# Patient Record
Sex: Male | Born: 1955
Health system: Southern US, Community
[De-identification: ages and names within clinical notes are randomized; demographics above are authoritative.]

## PROBLEM LIST (undated history)

## (undated) DIAGNOSIS — F32A Depression, unspecified: Secondary | ICD-10-CM

## (undated) DIAGNOSIS — J449 Chronic obstructive pulmonary disease, unspecified: Secondary | ICD-10-CM

## (undated) DIAGNOSIS — C801 Malignant (primary) neoplasm, unspecified: Secondary | ICD-10-CM

## (undated) DIAGNOSIS — Z87442 Personal history of urinary calculi: Secondary | ICD-10-CM

## (undated) DIAGNOSIS — I639 Cerebral infarction, unspecified: Secondary | ICD-10-CM

## (undated) DIAGNOSIS — T8859XA Other complications of anesthesia, initial encounter: Secondary | ICD-10-CM

## (undated) DIAGNOSIS — I10 Essential (primary) hypertension: Secondary | ICD-10-CM

## (undated) DIAGNOSIS — J189 Pneumonia, unspecified organism: Secondary | ICD-10-CM

## (undated) DIAGNOSIS — J45909 Unspecified asthma, uncomplicated: Secondary | ICD-10-CM

## (undated) DIAGNOSIS — I4891 Unspecified atrial fibrillation: Secondary | ICD-10-CM

## (undated) DIAGNOSIS — R0600 Dyspnea, unspecified: Secondary | ICD-10-CM

## (undated) DIAGNOSIS — M1990 Unspecified osteoarthritis, unspecified site: Secondary | ICD-10-CM

## (undated) HISTORY — DX: Depression, unspecified: F32.A

## (undated) HISTORY — DX: Unspecified osteoarthritis, unspecified site: M19.90

---

## 1998-09-18 ENCOUNTER — Ambulatory Visit (HOSPITAL_COMMUNITY): Admission: RE | Admit: 1998-09-18 | Discharge: 1998-09-18 | Payer: Self-pay | Admitting: Internal Medicine

## 1998-09-18 ENCOUNTER — Encounter: Payer: Self-pay | Admitting: Internal Medicine

## 2005-09-30 ENCOUNTER — Ambulatory Visit (HOSPITAL_COMMUNITY): Admission: RE | Admit: 2005-09-30 | Discharge: 2005-09-30 | Payer: Self-pay | Admitting: Internal Medicine

## 2005-09-30 ENCOUNTER — Encounter: Payer: Self-pay | Admitting: Internal Medicine

## 2005-09-30 ENCOUNTER — Ambulatory Visit: Payer: Self-pay | Admitting: Internal Medicine

## 2009-01-16 ENCOUNTER — Ambulatory Visit (HOSPITAL_COMMUNITY): Admission: RE | Admit: 2009-01-16 | Discharge: 2009-01-16 | Payer: Self-pay | Admitting: General Surgery

## 2009-12-14 ENCOUNTER — Ambulatory Visit (HOSPITAL_COMMUNITY): Admission: RE | Admit: 2009-12-14 | Discharge: 2009-12-14 | Payer: Self-pay | Admitting: Internal Medicine

## 2009-12-21 ENCOUNTER — Ambulatory Visit (HOSPITAL_COMMUNITY): Admission: RE | Admit: 2009-12-21 | Discharge: 2009-12-21 | Payer: Self-pay | Admitting: Orthopaedic Surgery

## 2010-11-23 LAB — DIFFERENTIAL
Basophils Absolute: 0 10*3/uL (ref 0.0–0.1)
Lymphocytes Relative: 27 % (ref 12–46)
Monocytes Absolute: 0.8 10*3/uL (ref 0.1–1.0)
Neutro Abs: 6.7 10*3/uL (ref 1.7–7.7)
Neutrophils Relative %: 64 % (ref 43–77)

## 2010-11-23 LAB — BASIC METABOLIC PANEL
BUN: 10 mg/dL (ref 6–23)
CO2: 29 mEq/L (ref 19–32)
Calcium: 9.7 mg/dL (ref 8.4–10.5)
Chloride: 101 mEq/L (ref 96–112)
Creatinine, Ser: 0.8 mg/dL (ref 0.4–1.5)
GFR calc Af Amer: >60 mL/min (ref >60)
GFR calc non Af Amer: >60 mL/min (ref >60)
Glucose, Bld: 89 mg/dL (ref 70–99)
Potassium: 3.9 mEq/L (ref 3.5–5.1)
Sodium: 138 mEq/L (ref 135–145)

## 2010-11-23 LAB — CBC
HCT: 48.3 % (ref 39.0–52.0)
Hemoglobin: 17 g/dL (ref 13.0–17.0)
MCHC: 35.1 g/dL (ref 30.0–36.0)
MCV: 92.1 fL (ref 78.0–100.0)
Platelets: 229 10*3/uL (ref 150–400)
RBC: 5.24 MIL/uL (ref 4.22–5.81)
RDW: 12.8 % (ref 11.5–15.5)
WBC: 10.5 10*3/uL (ref 4.0–10.5)

## 2010-12-28 NOTE — Op Note (Signed)
NAME:  MATHEWS, STUHR NO.:  [id number]   MEDICAL RECORD NO.:  [id number]          PATIENT TYPE:  AMB   LOCATION:  DAY                           FACILITY:  [facility]   PHYSICIAN:  Dalia Heading, M.D.  DATE OF BIRTH:  06-Jul-1956   DATE OF PROCEDURE:  01/16/2009  DATE OF DISCHARGE:  01/16/2009                               OPERATIVE REPORT   PREOPERATIVE DIAGNOSIS:  Right inguinal hernia.   POSTOPERATIVE DIAGNOSIS:  Right inguinal hernia, indirect.   PROCEDURE:  Right inguinal herniorrhaphy.   SURGEON:  Dalia Heading, MD   ANESTHESIA:  Spinal.   INDICATIONS:  The patient is a 55 year old white male who is referred  for evaluation and treatment of a right inguinal hernia.  He has been  symptomatic for some time now, but it has been recently increasing in  size and is causing him discomfort.  The patient now comes to the  operating room for right inguinal herniorrhaphy.  The risks and benefits  of the procedure including bleeding, infection, pain, and the possible  recurrence of the hernia were fully explained to the patient, gave  informed consent.   PROCEDURE NOTE:  The patient was placed in the supine position.  After  spinal anesthesia was administered, the right groin region was prepped  and draped using the usual sterile technique with Betadine.  Surgical  site confirmation was performed.   A transverse incision was made in the right groin region down to the  external oblique aponeurosis.  The aponeurosis was incised to the  external ring.  A Penrose drain was placed around the spermatic cord.  The vas deferens was noted within the spermatic cord.  Care was taken to  avoid the ilioinguinal nerve.  An indirect hernia sac was found.  This  was freed away from the spermatic cord up to the peritoneal reflection  and inverted.  A large-size PerFix mesh plug was then placed into this  region, secured to the transversalis fascia using 2-0 Novofil  interrupted  suture.  A onlay patch was then placed along the floor of  the inguinal canal and secured superiorly to the conjoined tendon and  inferiorly to the shelving edge of Poupart ligament using 2-0 Novofil  interrupted sutures.  The internal ring was recreated using a 2-0  Novofil interrupted suture.  The external oblique aponeurosis was  reapproximated using a 2-0 Vicryl running suture.  The subcutaneous  layer was reapproximated using a 3-0 Vicryl interrupted suture.  The  skin was closed using a 4-0 Vicryl subcuticular suture.  Sensorcaine  0.5% was instilled in the surrounding wound.  Dermabond was then  applied.   All tape and needle counts were correct at the end of the procedure.  The patient was transferred to PACU in stable condition.  Complications  none.   SPECIMEN:  None.   BLOOD LOSS:  Minimal.      Dalia Heading, M.D.  Electronically Signed     MAJ/MEDQ  D:  01/16/2009  T:  01/17/2009  Job:  [id number]   cc:   Channing Mutters  Marvene Staff, MD  Fax: [phone]

## 2010-12-28 NOTE — H&P (Signed)
NAME:  Jose Joseph, Jose Joseph NO.:  [id number]   MEDICAL RECORD NO.:  [id number]          PATIENT TYPE:  AMB   LOCATION:  DAY                           FACILITY:  [facility]   PHYSICIAN:  Dalia Heading, M.D.  DATE OF BIRTH:  [birth year]   DATE OF ADMISSION:  DATE OF DISCHARGE:  LH                              HISTORY & PHYSICAL   CHIEF COMPLAINT:  Right inguinal hernia.   HISTORY OF PRESENT ILLNESS:  The patient is a 55 year old white male who  is referred for evaluation and treatment of right inguinal hernia.  It  has been present for some time now, but recently has increased in size  and is causing discomfort.  It has made worse with straining.   PAST MEDICAL HISTORY:  Unremarkable.   PAST SURGICAL HISTORY:  Right knee surgery, right wrist surgery, neck  surgery.   CURRENT MEDICATIONS:  Wellbutrin.   ALLERGIES:  No known drug allergies.   REVIEW OF SYSTEMS:  Noncontributory.   PHYSICAL EXAMINATION:  GENERAL:  The patient is a well-developed, well-  nourished white male in no acute distress.  LUNGS:  Clear to auscultation with equal breath sounds bilaterally.  HEART:  Regular rate and rhythm without S3, S4, and murmurs.  ABDOMEN:  Soft, nontender, nondistended.  No hepatosplenomegaly or  masses are noted.  Reducible right inguinal hernia is present.  GENITOURINARY:  Within normal limits.   IMPRESSION:  Right inguinal hernia.   PLAN:  The patient is scheduled for right inguinal herniorrhaphy on January 16, 2009.  The risks and benefits of the procedure including bleeding,  infection, pain, recurrence of the hernia were fully explained to the  patient, gave informed consent.      Dalia Heading, M.D.  Electronically Signed     MAJ/MEDQ  D:  01/07/2009  T:  01/08/2009  Job:  [id number]   cc:   Kingsley Callander. Ouida Sills, MD  Fax: [phone]

## 2010-12-31 NOTE — Op Note (Signed)
NAME:  Jose Joseph, Jose Joseph NO.:  [id number]   MEDICAL RECORD NO.:  [id number]          PATIENT TYPE:  AMB   LOCATION:  DAY                           FACILITY:  [facility]   PHYSICIAN:  R. Roetta Sessions, M.D. DATE OF BIRTH:  1956-04-23   DATE OF PROCEDURE:  09/30/2005  DATE OF DISCHARGE:                                 OPERATIVE REPORT   PROCEDURE:  Colonoscopy with biopsy.   INDICATIONS FOR PROCEDURE:  The patient is a 55 year old gentleman sent over  at the courtesy of Dr. Carylon Perches for colorectal cancer screening. He is  devoid of any lower GI tract symptoms. There is no history of colorectal  neoplasia in his family. He has never had his lower GI tract imaged.  Colonoscopy is now being done. This approach has been discussed with the  patient at length. Potential risks, benefits, and alternatives have been  reviewed and questions answered. He is agreeable. Please see documentation  in the medical record.   PROCEDURE NOTE:  O2 saturation, blood pressure, pulse, and respirations were  monitored throughout the entire procedure. Conscious sedation with Versed 4  mg IV and Demerol 100 mg IV in divided doses.   INSTRUMENT:  Olympus video chip system.   FINDINGS:  Digital rectal exam revealed no abnormalities.   ENDOSCOPIC FINDINGS:  Prep was good.   Rectum:  Examination of the rectal mucosa including retroflexed view of the  anal verge revealed no abnormalities.   Colon:  Colonic mucosa was surveyed from the rectosigmoid junction through  the left, transverse, and right colon to the area of the appendiceal  orifice, ileocecal valve, and cecum. These structures were well seen and  photographed for the record. From this level, the scope was slowly  withdrawn, and all previously mentioned mucosal surfaces were again seen.  The patient was noted to have scattered sigmoid diverticula. There were  three 3-mm diminutive polyps at 25 cm around the anal verge. There were cold  biopsied/removed. The remainder of the colonic mucosa appeared normal. The  patient tolerated the procedure well and was reactive to endoscopy. Cecal  withdraw time 10 minutes.   IMPRESSION:  1.  Normal rectum.  2.  Left sided diverticula. Three diminutive polyps at 25 cm, removed with      cold biopsy forceps technique. The remainder of the colonic mucosa      appeared normal.   RECOMMENDATIONS:  1.  Diverticulosis literature provided to [name].  2.  Follow up on pathology.  3.  Further recommendations to follow.      Jonathon Bellows, M.D.  Electronically Signed     RMR/MEDQ  D:  09/30/2005  T:  09/30/2005  Job:  [id number]   cc:   Kingsley Callander. Ouida Sills, MD  Fax: [phone]

## 2015-09-07 ENCOUNTER — Telehealth: Payer: Self-pay | Admitting: Internal Medicine

## 2015-09-07 NOTE — Telephone Encounter (Signed)
RECALL FOR TCS °

## 2015-09-09 NOTE — Telephone Encounter (Signed)
Letter mailed to pt.  

## 2016-08-26 DIAGNOSIS — Z0001 Encounter for general adult medical examination with abnormal findings: Secondary | ICD-10-CM | POA: Diagnosis not present

## 2016-09-02 DIAGNOSIS — Z0001 Encounter for general adult medical examination with abnormal findings: Secondary | ICD-10-CM | POA: Diagnosis not present

## 2016-09-02 DIAGNOSIS — Z6834 Body mass index (BMI) 34.0-34.9, adult: Secondary | ICD-10-CM | POA: Diagnosis not present

## 2016-09-02 DIAGNOSIS — K219 Gastro-esophageal reflux disease without esophagitis: Secondary | ICD-10-CM | POA: Diagnosis not present

## 2016-09-02 DIAGNOSIS — Z23 Encounter for immunization: Secondary | ICD-10-CM | POA: Diagnosis not present

## 2016-09-02 DIAGNOSIS — M1990 Unspecified osteoarthritis, unspecified site: Secondary | ICD-10-CM | POA: Diagnosis not present

## 2017-01-18 DIAGNOSIS — H40033 Anatomical narrow angle, bilateral: Secondary | ICD-10-CM | POA: Diagnosis not present

## 2017-01-18 DIAGNOSIS — H2513 Age-related nuclear cataract, bilateral: Secondary | ICD-10-CM | POA: Diagnosis not present

## 2018-05-16 DIAGNOSIS — I8311 Varicose veins of right lower extremity with inflammation: Secondary | ICD-10-CM | POA: Diagnosis not present

## 2018-05-16 DIAGNOSIS — M79604 Pain in right leg: Secondary | ICD-10-CM | POA: Diagnosis not present

## 2018-05-16 DIAGNOSIS — I8312 Varicose veins of left lower extremity with inflammation: Secondary | ICD-10-CM | POA: Diagnosis not present

## 2018-05-16 DIAGNOSIS — M79605 Pain in left leg: Secondary | ICD-10-CM | POA: Diagnosis not present

## 2018-05-17 ENCOUNTER — Telehealth (HOSPITAL_COMMUNITY): Payer: Self-pay | Admitting: Surgery

## 2018-05-17 NOTE — Telephone Encounter (Signed)
Received an ABI order from [facility] (Dr. Coralie Carpen) for poorly palpable foot pulses, claudication symptoms.    I spoke with Jose Joseph, he cannot schedule an appointment at this time, he is helping his son financially.  He will explain this to Dr. Jimmie Molly and he will call us once he is ready to schedule the appointment.   [name] [name]

## 2018-11-06 DIAGNOSIS — M1612 Unilateral primary osteoarthritis, left hip: Secondary | ICD-10-CM | POA: Diagnosis not present

## 2018-11-06 DIAGNOSIS — F3340 Major depressive disorder, recurrent, in remission, unspecified: Secondary | ICD-10-CM | POA: Diagnosis not present

## 2019-02-06 DIAGNOSIS — M1990 Unspecified osteoarthritis, unspecified site: Secondary | ICD-10-CM | POA: Diagnosis not present

## 2019-02-06 DIAGNOSIS — N529 Male erectile dysfunction, unspecified: Secondary | ICD-10-CM | POA: Diagnosis not present

## 2019-02-06 DIAGNOSIS — Z79899 Other long term (current) drug therapy: Secondary | ICD-10-CM | POA: Diagnosis not present

## 2019-02-06 DIAGNOSIS — Z125 Encounter for screening for malignant neoplasm of prostate: Secondary | ICD-10-CM | POA: Diagnosis not present

## 2019-02-06 DIAGNOSIS — K219 Gastro-esophageal reflux disease without esophagitis: Secondary | ICD-10-CM | POA: Diagnosis not present

## 2019-02-06 DIAGNOSIS — E6609 Other obesity due to excess calories: Secondary | ICD-10-CM | POA: Diagnosis not present

## 2019-02-06 DIAGNOSIS — F329 Major depressive disorder, single episode, unspecified: Secondary | ICD-10-CM | POA: Diagnosis not present

## 2020-02-26 ENCOUNTER — Ambulatory Visit (INDEPENDENT_AMBULATORY_CARE_PROVIDER_SITE_OTHER): Payer: Medicare Other

## 2020-02-26 ENCOUNTER — Ambulatory Visit (INDEPENDENT_AMBULATORY_CARE_PROVIDER_SITE_OTHER): Payer: Medicare Other | Admitting: Orthopaedic Surgery

## 2020-02-26 ENCOUNTER — Other Ambulatory Visit: Payer: Self-pay

## 2020-02-26 ENCOUNTER — Ambulatory Visit (INDEPENDENT_AMBULATORY_CARE_PROVIDER_SITE_OTHER): Payer: Self-pay

## 2020-02-26 ENCOUNTER — Encounter: Payer: Self-pay | Admitting: Orthopaedic Surgery

## 2020-02-26 VITALS — Ht 74.0 in | Wt 270.0 lb

## 2020-02-26 DIAGNOSIS — G8929 Other chronic pain: Secondary | ICD-10-CM

## 2020-02-26 DIAGNOSIS — M25552 Pain in left hip: Secondary | ICD-10-CM | POA: Insufficient documentation

## 2020-02-26 DIAGNOSIS — M25511 Pain in right shoulder: Secondary | ICD-10-CM

## 2020-02-26 MED ORDER — METHYLPREDNISOLONE ACETATE 40 MG/ML IJ SUSP
80.0000 mg | INTRAMUSCULAR | Status: AC | PRN
  Administered 2020-02-26: 80 mg via INTRA_ARTICULAR

## 2020-02-26 MED ORDER — LIDOCAINE HCL 2 % IJ SOLN
2.0000 mL | INTRAMUSCULAR | Status: AC | PRN
  Administered 2020-02-26: 2 mL

## 2020-02-26 MED ORDER — BUPIVACAINE HCL 0.5 % IJ SOLN
2.0000 mL | INTRAMUSCULAR | Status: AC | PRN
  Administered 2020-02-26: 2 mL via INTRA_ARTICULAR

## 2020-02-26 NOTE — Progress Notes (Signed)
Office Visit Note   Patient: Jose Joseph           Date of Birth: Nov 22, 1955           MRN: [id number] Visit Date: 02/26/2020              Requested by: No referring provider defined for this encounter. PCP: No primary care provider on file.   Assessment & Plan: Visit Diagnoses:  1. Chronic right shoulder pain   2. Pain in left hip   3. Acute pain of right shoulder     Plan: End-stage osteoarthritis left hip.  This has progressed significantly over the past 10 years.  Long discussion regarding treatment options.  He would like to consider a hip replacement sometime next year when his wife would be available to help them in his rehabilitation.  I had a long discussion regarding the procedure, hospitalization, potential problems and need for rectal clearance.  If he like to consider doing something sooner he will call.  Right shoulder is a combination of impingement and mild osteoarthritis of the glenohumeral joint.  I injected the subacromial space and the made a "big difference".  We will monitor his response  Follow-Up Instructions: No follow-ups on file.   Orders:  Orders Placed This Encounter  Procedures  . XR Shoulder Right  . XR HIP UNILAT W OR W/O PELVIS 2-3 VIEWS LEFT   No orders of the defined types were placed in this encounter.     Procedures: Large Joint Inj: R subacromial bursa on 02/26/2020 2:30 PM Indications: pain and diagnostic evaluation Details: 25 G 1.5 in needle, anterolateral approach  Arthrogram: No  Medications: 2 mL lidocaine 2 %; 2 mL bupivacaine 0.5 %; 80 mg methylPREDNISolone acetate 40 MG/ML Consent was given by the patient. Immediately prior to procedure a time out was called to verify the correct patient, procedure, equipment, support staff and site/side marked as required. Patient was prepped and draped in the usual sterile fashion.       Clinical Data: No additional findings.   Subjective: Chief Complaint  Patient presents with    . Left Hip - Pain  . Right Shoulder - Pain  Patient presents today for right shoulder and left hip pain. He said that he fell backwards in April of this year and injured his right shoulder. His pain is located at his lateral superior shoulder. He states that it pops. He has good range of motion. No numbness, tingling, or weakness. He is right hand dominant.  He relates that he stumbled on a rock striking the right shoulder.  He is better but it still hurts.  He has been taking tramadol and ibuprofen. He also is here for his left hip. He states that he saw Dr.Dorion Petillo several years ago for his hip. His pain is located in his groin. He said that his pain in his hip is affected the way he walks now. His leg is weaker and he has to be careful when walking up stairs. He takes Tramadol and ibuprofen daily.  X-rays were obtained of his left hip about 10 years ago demonstrating early degenerative changes.  He notes that now his right left hip "affects everything.  No numbness or tingling.  He is retired and notes that the pain is predominately in the left groin.  HPI  Review of Systems   Objective: Vital Signs: Ht 6\' 2"  (1.88 m)   Wt 270 lb (122.5 kg)   BMI 34.67 kg/m  Physical Exam Constitutional:      Appearance: He is well-developed.  Eyes:     Pupils: Pupils are equal, round, and reactive to light.  Pulmonary:     Effort: Pulmonary effort is normal.  Skin:    General: Skin is warm and dry.  Neurological:     Mental Status: He is alert and oriented to person, place, and time.  Psychiatric:        Behavior: Behavior normal.     Ortho Exam awake alert and oriented x3.  Comfortable sitting.  Certainly has pain in his left hip with any rotation from a neutral position and limited to only about 10 degrees of internal/external rotation before he experiences pain.  Might be quarter of an inch shorter on the left than the right.  Neurologically intact.  Straight leg raise negative does have  some decreased internal rotation of the right hip but minimal pain.  Left shoulder with easy full overhead motion.  There was some popping and clicking in the subacromial region both anteriorly and laterally in the impingement position.  Negative empty can testing.  Minimally positive Speed sign.  Biceps intact.  Good strength Specialty Comments:  No specialty comments available.  Imaging: No results found.   PMFS History: Patient Active Problem List   Diagnosis Date Noted  . Pain in left hip 02/26/2020  . Pain in right shoulder 02/26/2020   History reviewed. No pertinent past medical history.  History reviewed. No pertinent family history.  History reviewed. No pertinent surgical history. Social History   Occupational History  . Not on file  Tobacco Use  . Smoking status: Not on file  Substance and Sexual Activity  . Alcohol use: Not on file  . Drug use: Not on file  . Sexual activity: Not on file

## 2020-04-03 ENCOUNTER — Ambulatory Visit (HOSPITAL_COMMUNITY)
Admission: RE | Admit: 2020-04-03 | Discharge: 2020-04-03 | Disposition: A | Discharge status: Home / Self Care | Payer: Medicare Other | Source: Ambulatory Visit | Attending: Internal Medicine | Admitting: Internal Medicine

## 2020-04-03 ENCOUNTER — Other Ambulatory Visit (HOSPITAL_COMMUNITY): Payer: Self-pay | Admitting: Internal Medicine

## 2020-04-03 ENCOUNTER — Other Ambulatory Visit: Payer: Self-pay

## 2020-04-03 ENCOUNTER — Other Ambulatory Visit: Payer: Self-pay | Admitting: Internal Medicine

## 2020-04-03 DIAGNOSIS — N200 Calculus of kidney: Secondary | ICD-10-CM | POA: Diagnosis not present

## 2020-04-10 ENCOUNTER — Ambulatory Visit (INDEPENDENT_AMBULATORY_CARE_PROVIDER_SITE_OTHER): Payer: Medicare Other | Admitting: Urology

## 2020-04-10 ENCOUNTER — Encounter: Payer: Self-pay | Admitting: Urology

## 2020-04-10 ENCOUNTER — Other Ambulatory Visit: Payer: Self-pay

## 2020-04-10 VITALS — BP 118/74 | HR 68 | Temp 98.6°F | Ht 74.0 in | Wt 280.0 lb

## 2020-04-10 DIAGNOSIS — R972 Elevated prostate specific antigen [PSA]: Secondary | ICD-10-CM

## 2020-04-10 LAB — URINALYSIS, ROUTINE W REFLEX MICROSCOPIC
Bilirubin, UA: NEGATIVE
Glucose, UA: NEGATIVE
Ketones, UA: NEGATIVE
Leukocytes,UA: NEGATIVE
Nitrite, UA: NEGATIVE
Protein,UA: NEGATIVE
Specific Gravity, UA: 1.02 (ref 1.005–1.030)
Urobilinogen, Ur: 0.2 mg/dL (ref 0.2–1.0)
pH, UA: 6 (ref 5.0–7.5)

## 2020-04-10 LAB — MICROSCOPIC EXAMINATION
Bacteria, UA: NONE SEEN
Epithelial Cells (non renal): NONE SEEN /hpf (ref 0–10)
Renal Epithel, UA: NONE SEEN /hpf
WBC, UA: NONE SEEN /hpf (ref 0–5)

## 2020-04-10 LAB — BLADDER SCAN AMB NON-IMAGING: Scan Result: 65.4

## 2020-04-10 NOTE — Patient Instructions (Signed)
Prostate-Specific Antigen Test Why am I having this test? The prostate-specific antigen (PSA) test is a screening test for prostate cancer. It can identify early signs of prostate cancer, which may allow for more effective treatment. Your health care provider may recommend that you have a PSA test starting at age 66 or that you have one earlier or later, depending on your risk factors for prostate cancer. You may also have a PSA test:  To monitor treatment of prostate cancer.  To check whether prostate cancer has returned after treatment.  If you have signs of other conditions that can affect PSA levels, such as: ? An enlarged prostate that is not caused by cancer (benign prostatic hyperplasia, BPH). This condition is very common in older men. ? A prostate infection. What is being tested? This test measures the amount of PSA in your blood. PSA is a protein that is made in the prostate. The prostate naturally produces more PSA as you age, but very high levels may be a sign of a medical condition. What kind of sample is taken?  A blood sample is required for this test. It is usually collected by inserting a needle into a blood vessel or by sticking a finger with a small needle. Blood for this test should be drawn before having an exam of the prostate. How do I prepare for this test? Do not ejaculate starting 24 hours before your test, or as long as told by your health care provider. Tell a health care provider about:  Any allergies you have.  All medicines you are taking, including vitamins, herbs, eye drops, creams, and over-the-counter medicines. This also includes: ? Medicines to assist with hair growth, such as finasteride. ? Any recent exposure to a medicine called diethylstilbestrol.  Any blood disorders you have.  Any recent procedures you have had, especially any procedures involving the prostate or rectum.  Any medical conditions you have.  Any recent urinary tract infections  (UTIs) you have had. How are the results reported? Your test results will be reported as a value that indicates how much PSA is in your blood. This will be given as nanograms of PSA per milliliter of blood (ng/mL). Your health care provider will compare your results to normal ranges that were established after testing a large group of people (reference ranges). Reference ranges may vary among labs and hospitals. PSA levels vary from person to person and generally increase with age. Because of this variation, there is no single PSA value that is considered normal for everyone. Instead, PSA reference ranges are used to describe whether your PSA levels are considered low or high (elevated). Common reference ranges are:  Low: 0-2.5 ng/mL.  Slightly to moderately elevated: 2.6-10.0 ng/mL.  Moderately elevated: 10.0-19.9 ng/mL.  Significantly elevated: 20 ng/mL or greater. Sometimes, the test results may report that a condition is present when it is not present (false-positive result). What do the results mean? A test result that is higher than 4 ng/mL may mean that you are at an increased risk for prostate cancer. However, a PSA test by itself is not enough to diagnose prostate cancer. High PSA levels may also be caused by the natural aging process, prostate infection, or BPH. PSA screening cannot tell you if your PSA is high due to cancer or a different cause. A prostate biopsy is the only way to diagnose prostate cancer. A risk of having the PSA test is diagnosing and treating prostate cancer that would never have caused any   symptoms or problems (overdiagnosis and overtreatment). Talk with your health care provider about what your results mean. Questions to ask your health care provider Ask your health care provider, or the department that is doing the test:  When will my results be ready?  How will I get my results?  What are my treatment options?  What other tests do I need?  What are my  next steps? Summary  The prostate-specific antigen (PSA) test is a screening test for prostate cancer.  Your health care provider may recommend that you have a PSA test starting at age 66 or that you have one earlier or later, depending on your risk factors for prostate cancer.  A test result that is higher than 4 ng/mL may mean that you are at an increased risk for prostate cancer. However, elevated levels can be caused by a number of conditions other than prostate cancer.  Talk with your health care provider about what your results mean. This information is not intended to replace advice given to you by your health care provider. Make sure you discuss any questions you have with your health care provider. Document Revised: 07/14/2017 Document Reviewed: 05/08/2017 Elsevier Patient Education  2020 [employer]  

## 2020-04-10 NOTE — Progress Notes (Signed)
04/10/2020 1:33 PM   Jose Joseph [birth year] [id number]  Referring provider: Carylon Perches, MD [address] [place],  [place] [address]  Elevated PSA  HPI: Jose Joseph is a 89MB here for evaluation of elevated PSA. PSA 4.6 in 02/2020. Per patient his PSA last year was around 2.  He has baseline nocturia 2x, weaker stream, urinary urgency with occasional urge incontinence. He is currently on antibiotics for an E coli UTI.   He had a kidney stone in July 2021 which was his first stone event   PMH: Past Medical History:  Diagnosis Date  . Arthritis   . Depression     Surgical History: Past Surgical History:  Procedure Laterality Date  . KNEE ARTHROCENTESIS    . NECK SURGERY    . WRIST ARTHROPLASTY      Home Medications:  Allergies as of 04/10/2020      Reactions   Bee Venom       Medication List       Accurate as of April 10, 2020  1:33 PM. If you have any questions, ask your nurse or doctor.        cetirizine 10 MG chewable tablet Commonly known as: ZYRTEC Chew 10 mg by mouth daily.   citalopram 20 MG tablet Commonly known as: CELEXA Take 20 mg by mouth daily.   EPINEPHrine 0.1 % nasal solution Commonly known as: ADRENALIN Place 1 drop into the nose once.   EPINEPHrine 0.15 MG/0.3ML injection Commonly known as: EPIPEN JR Inject 0.15 mg into the muscle as needed for anaphylaxis.   fexofenadine 180 MG tablet Commonly known as: ALLEGRA Take 180 mg by mouth daily.   ibuprofen 200 MG tablet Commonly known as: ADVIL Take 200 mg by mouth every 6 (six) hours as needed.   traMADol 50 MG tablet Commonly known as: ULTRAM Take by mouth every 6 (six) hours as needed.       Allergies:  Allergies  Allergen Reactions  . Bee Venom     Family History: Family History  Problem Relation Age of Onset  . Kidney cancer Sister   . Prostate cancer Brother     Social History:  reports that he quit smoking about 7 years ago. His smoking use included  cigarettes. He has a 35.00 pack-year smoking history. He has never used smokeless tobacco. He reports current alcohol use of about 2.0 standard drinks of alcohol per week. No history on file for drug use.  ROS: All other review of systems were reviewed and are negative except what is noted above in HPI  Physical Exam: BP 118/74   Pulse 68   Temp 98.6 F (37 C)   Ht 6\' 2"  (1.88 m)   Wt 280 lb (127 kg)   BMI 35.95 kg/m   Constitutional:  Alert and oriented, No acute distress. HEENT: [place] AT, moist mucus membranes.  Trachea midline, no masses. Cardiovascular: No clubbing, cyanosis, or edema. Respiratory: Normal respiratory effort, no increased work of breathing. GI: Abdomen is soft, nontender, nondistended, no abdominal masses GU: No CVA tenderness. Circumcised phallus. No masses/lesions on penis, testis, scrotum. Prostate 40g smooth no nodules no induration.  Lymph: No cervical or inguinal lymphadenopathy. Skin: No rashes, bruises or suspicious lesions. Neurologic: Grossly intact, no focal deficits, moving all 4 extremities. Psychiatric: Normal mood and affect.  Laboratory Data: Lab Results  Component Value Date   WBC 10.5 01/08/2009   HGB 17.0 01/08/2009   HCT 48.3 01/08/2009   MCV 92.1 01/08/2009  PLT 229 01/08/2009    Lab Results  Component Value Date   CREATININE 0.80 01/08/2009    No results found for: PSA  No results found for: TESTOSTERONE  No results found for: HGBA1C  Urinalysis No results found for: COLORURINE, APPEARANCEUR, LABSPEC, PHURINE, GLUCOSEU, HGBUR, BILIRUBINUR, KETONESUR, PROTEINUR, UROBILINOGEN, NITRITE, LEUKOCYTESUR  No results found for: LABMICR, WBCUA, RBCUA, LABEPIT, MUCUS, BACTERIA  Pertinent Imaging:  No results found for this or any previous visit.  No results found for this or any previous visit.  No results found for this or any previous visit.  No results found for this or any previous visit.  No results found for this or any  previous visit.  No results found for this or any previous visit.  No results found for this or any previous visit.  Results for orders placed during the hospital encounter of 04/03/20  CT RENAL STONE STUDY  Addendum 04/03/2020  3:13 PM ADDENDUM REPORT: 04/03/2020 15:10  ADDENDUM: Add to  IMPRESSION: Urinary bladder wall thickness increased. Suspect a degree of underlying cystitis.   Electronically Signed By: Bretta Bang III M.D. On: 04/03/2020 15:10  Narrative CLINICAL DATA:  Left flank pain with nausea  EXAM: CT ABDOMEN AND PELVIS WITHOUT CONTRAST  TECHNIQUE: Multidetector CT imaging of the abdomen and pelvis was performed following the standard protocol without oral or IV contrast.  COMPARISON:  None.  FINDINGS: Lower chest: Lung bases are clear.  Hepatobiliary: No focal liver lesions are evident on this noncontrast enhanced study. The gallbladder wall is not appreciably thickened. There is no biliary duct dilatation.  Pancreas: There is no pancreatic mass or inflammatory focus.  Spleen: No splenic lesions are evident.  Adrenals/Urinary Tract: Adrenals bilaterally appear unremarkable. There is a cyst arising from the lower pole of the left kidney measuring 6.2 x 4.7 cm. There is a cyst arising from the lateral lower pole of the right kidney measuring 1.3 x 1.2 cm. There is a cyst in the posterior mid right kidney measuring 1.5 x 1.4 cm. There is no appreciable hydronephrosis on either side. Note that the lower pole left renal cyst abuts and slightly impresses upon the proximal left ureter. There is no evident renal or ureteral calculus on either side. Urinary bladder wall thickness is increased.  Stomach/Bowel: There is no appreciable bowel wall or mesenteric thickening. No evident bowel obstruction. The terminal ileum appears normal. No free air or portal venous air.  Vascular/Lymphatic: There is no abdominal aortic aneurysm. There is aortic and  iliac artery atherosclerosis. Note that the aorta is somewhat tortuous. There is no adenopathy appreciable in the abdomen or pelvis.  Reproductive: Prostate and seminal vesicles are normal in size and contour. There are a few tiny prostatic calculi.  Other: The appendix appears normal. There is no evident abscess or ascites in the abdomen or pelvis. There is slight fat in the umbilicus.  Musculoskeletal: There is degenerative change in the lumbar spine. No blastic or lytic bone lesions. No intramuscular lesions evident.  IMPRESSION: 1. There is a cyst arising from the lower pole left kidney measuring 6.2 x 4.7 cm. This cyst abuts and minimally displaces the proximal left ureter. This finding conceivably could be a cause for left flank pain. No appreciable hydronephrosis on either side noted. No renal or ureteral calculus on either side.  2. No bowel obstruction. No abscess in the abdomen or pelvis. Appendix appears normal.  3. Aortic Atherosclerosis (ICD10-I70.0). There is iliac artery atherosclerotic calcification as well.  Electronically  Signed: By: Bretta Bang III M.D. On: 04/03/2020 14:38   Assessment & Plan:    1. Elevated PSA -The patient and I talked about etiologies of elevated PSA.  We discussed the possible relationship between elevated PSA and prostate cancer, BPH, prostatitis, infection trauma and recent ejaculations.  The patient's PSA has been relatively stable over the interval and as such I recommended that we follow-up with a repeat PSA in 6 months.  If it remains elevated with a positive rising trend we will discuss prostate biopsy at his follow-up appointment. - Urinalysis, Routine w reflex microscopic   No follow-ups on file.  Wilkie Aye, MD  [facility] [place]

## 2020-04-10 NOTE — Progress Notes (Signed)
Urological Symptom Review  Patient is experiencing the following symptoms: Frequent urination Get up at night to urinate Leakage of urine Urinary tract infection Kidney stones  Review of Systems  Gastrointestinal (upper)  : Negative for upper GI symptoms  Gastrointestinal (lower) : Negative for lower GI symptoms  Constitutional : Negative for symptoms  Skin: Negative for skin symptoms  Eyes: Blurred vision  Ear/Nose/Throat : Sinus problems  Hematologic/Lymphatic: Negative for Hematologic/Lymphatic symptoms  Cardiovascular : Negative for cardiovascular symptoms  Respiratory : Negative for respiratory symptoms  Endocrine: Negative for endocrine symptoms  Musculoskeletal: Back pain  Neurological: Dizziness  Psychologic: Depression

## 2020-10-05 IMAGING — CT CT RENAL STONE PROTOCOL
2 of 3 series · 11 of 36 positions shown, 15 images · non-contrast
Comparison: None.
COMPARISON: None.

Addendum:
CLINICAL DATA: Left flank pain with nausea

EXAM:
CT ABDOMEN AND PELVIS WITHOUT CONTRAST
TECHNIQUE: Multidetector CT imaging of the abdomen and pelvis was performed
following the standard protocol without oral or IV contrast.

[Series 2: axial st · axial · 0.98mm/px · z∈[+763,+1178]mm · 10 of 101 slices shown, 13 images]
[im 12/101  soft-tissue]
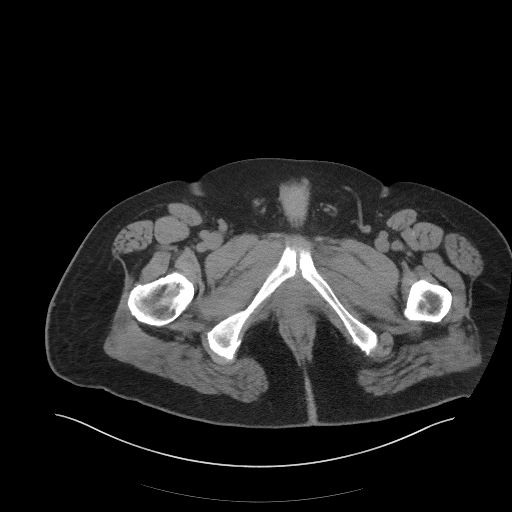
[im 12/101  bone]
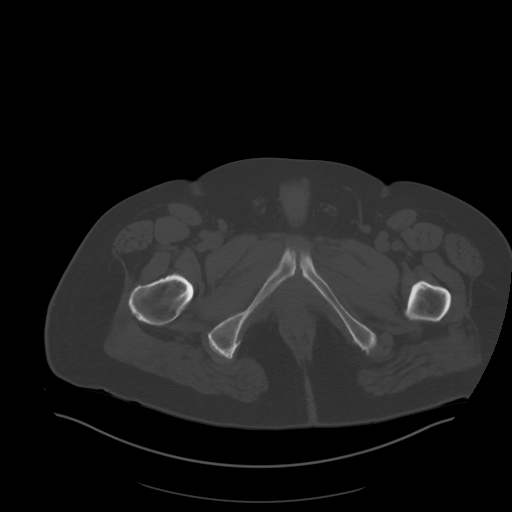
[im 23/101  soft-tissue]
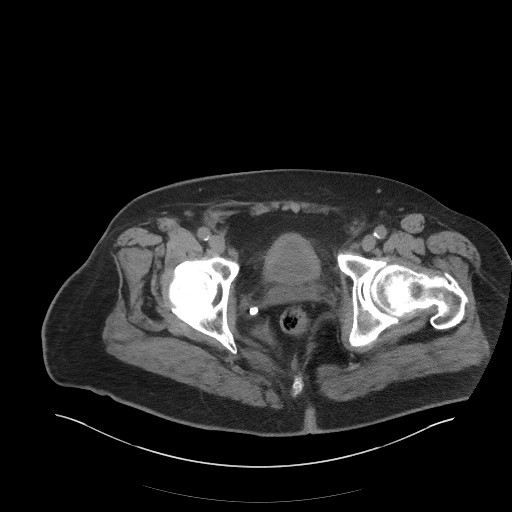
[im 34/101  soft-tissue]
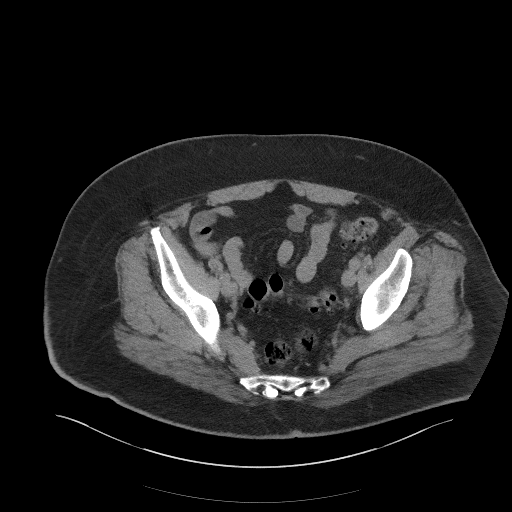
[im 45/101  soft-tissue]
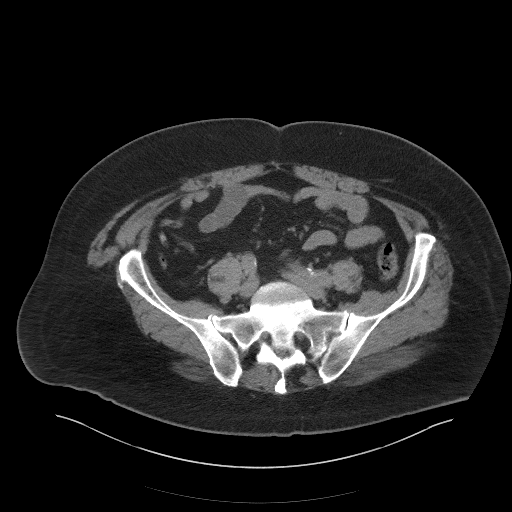
[im 56/101  soft-tissue]
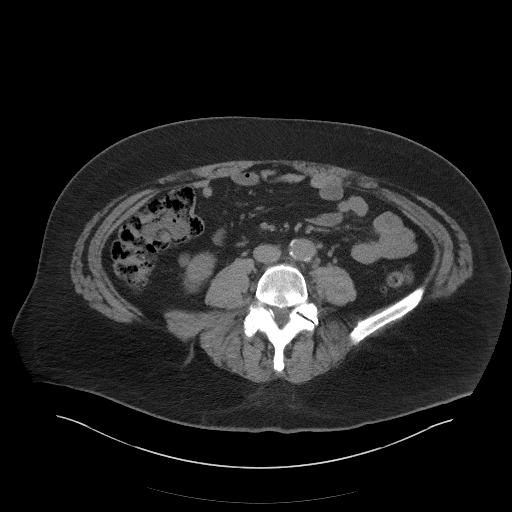
[im 67/101  soft-tissue]
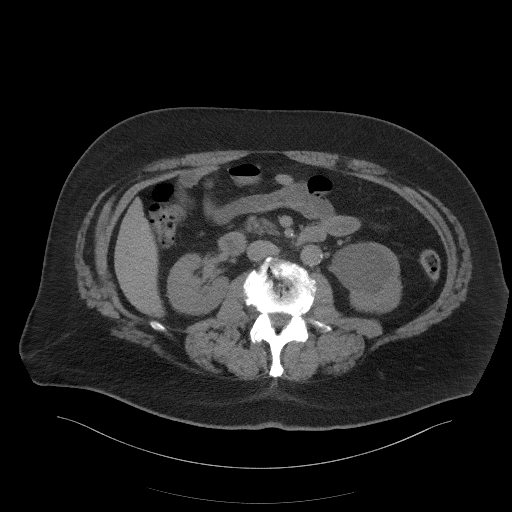
[im 78/101  soft-tissue]
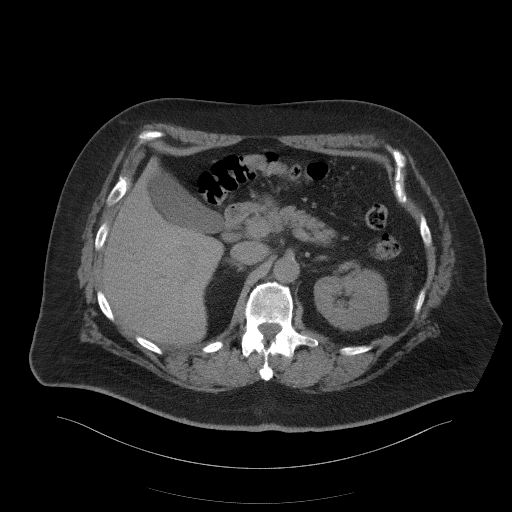
[im 78/101  lung]
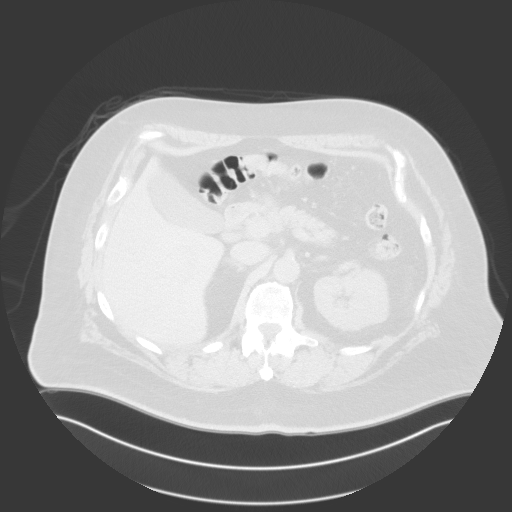
[im 84/101  lung]
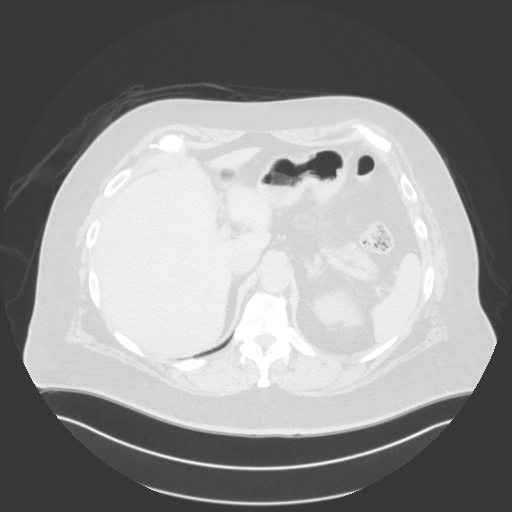
[im 89/101  soft-tissue]
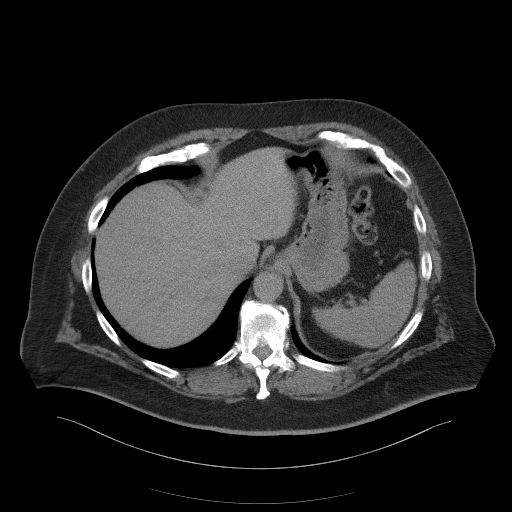
[im 89/101  lung]
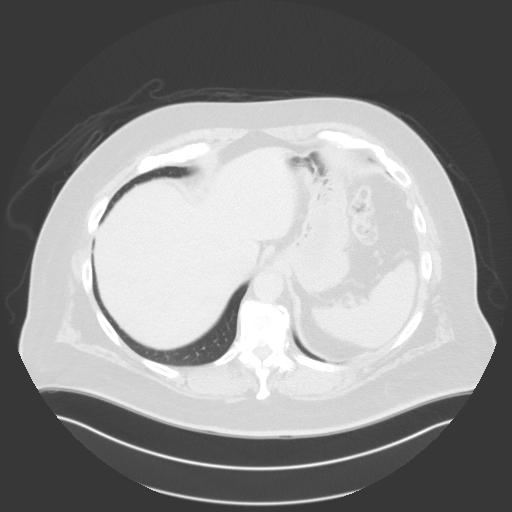
[im 95/101  lung]
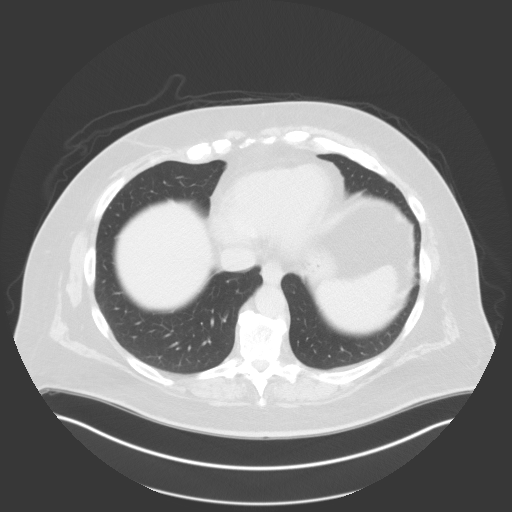

[Series 6: sagittal st · sagittal · 0.84mm/px · 1 of 153 slices shown, 2 images]
[im 58/153  soft-tissue]
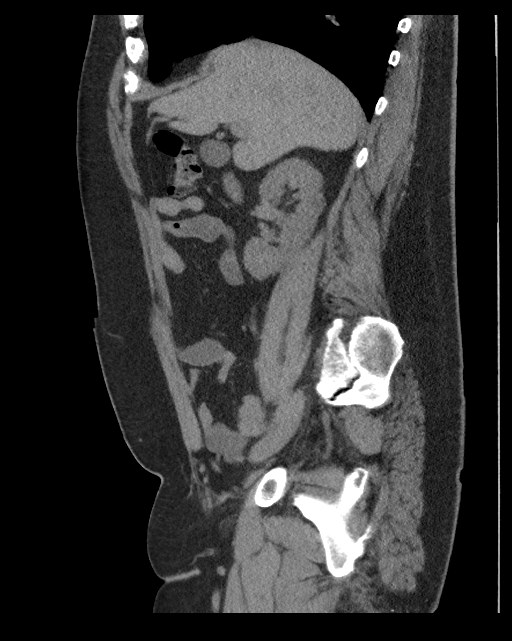
[im 58/153  bone]
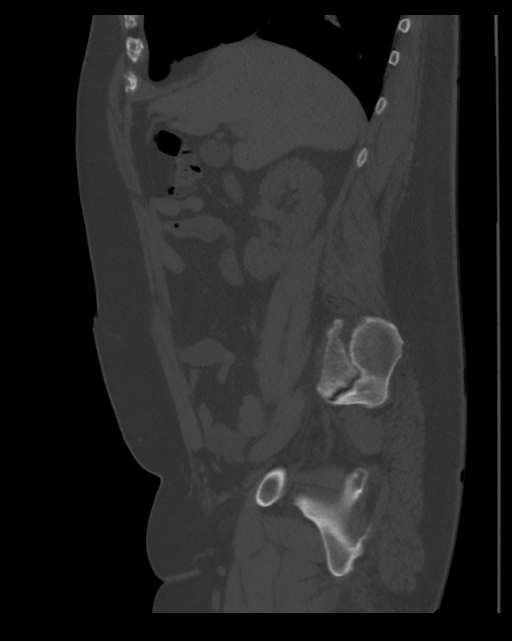

[11 of 36 positions shown; findings below may reference images not displayed]

FINDINGS: Lower chest: Lung bases are clear.

Hepatobiliary: No focal liver lesions are evident on this
noncontrast enhanced study. The gallbladder wall is not appreciably
thickened. There is no biliary duct dilatation.

Pancreas: There is no pancreatic mass or inflammatory focus.

Spleen: No splenic lesions are evident.

Adrenals/Urinary Tract: Adrenals bilaterally appear unremarkable.
There is a cyst arising from the lower pole of the left kidney
measuring 6.2 x 4.7 cm. There is a cyst arising from the lateral
lower pole of the right kidney measuring 1.3 x 1.2 cm. There is a
cyst in the posterior mid right kidney measuring 1.5 x 1.4 cm. There
is no appreciable hydronephrosis on either side. Note that the lower
pole left renal cyst abuts and slightly impresses upon the proximal
left ureter. There is no evident renal or ureteral calculus on
either side. Urinary bladder wall thickness is increased.

Stomach/Bowel: There is no appreciable bowel wall or mesenteric
thickening. No evident bowel obstruction. The terminal ileum appears
normal. No free air or portal venous air.

Vascular/Lymphatic: There is no abdominal aortic aneurysm. There is
aortic and iliac artery atherosclerosis. Note that the aorta is
somewhat tortuous. There is no adenopathy appreciable in the abdomen
or pelvis.

Reproductive: Prostate and seminal vesicles are normal in size and
contour. There are a few tiny prostatic calculi.

Other: The appendix appears normal. There is no evident abscess or
ascites in the abdomen or pelvis. There is slight fat in the
umbilicus.

Musculoskeletal: There is degenerative change in the lumbar spine.
No blastic or lytic bone lesions. No intramuscular lesions evident.
IMPRESSION: 1. There is a cyst arising from the lower pole left kidney measuring
6.2 x 4.7 cm. This cyst abuts and minimally displaces the proximal
left ureter. This finding conceivably could be a cause for left
flank pain. No appreciable hydronephrosis on either side noted. No
renal or ureteral calculus on either side.

2. No bowel obstruction. No abscess in the abdomen or pelvis.
Appendix appears normal.

3. Aortic Atherosclerosis (4UOXH-G4T.T). There is iliac artery
atherosclerotic calcification as well.

ADDENDUM:
Add to
IMPRESSION: Urinary bladder wall thickness increased. Suspect a degree of
underlying cystitis.

*** End of Addendum ***
FINDINGS: Lower chest: Lung bases are clear.

Hepatobiliary: No focal liver lesions are evident on this
noncontrast enhanced study. The gallbladder wall is not appreciably
thickened. There is no biliary duct dilatation.

Pancreas: There is no pancreatic mass or inflammatory focus.

Spleen: No splenic lesions are evident.

Adrenals/Urinary Tract: Adrenals bilaterally appear unremarkable.
There is a cyst arising from the lower pole of the left kidney
measuring 6.2 x 4.7 cm. There is a cyst arising from the lateral
lower pole of the right kidney measuring 1.3 x 1.2 cm. There is a
cyst in the posterior mid right kidney measuring 1.5 x 1.4 cm. There
is no appreciable hydronephrosis on either side. Note that the lower
pole left renal cyst abuts and slightly impresses upon the proximal
left ureter. There is no evident renal or ureteral calculus on
either side. Urinary bladder wall thickness is increased.

Stomach/Bowel: There is no appreciable bowel wall or mesenteric
thickening. No evident bowel obstruction. The terminal ileum appears
normal. No free air or portal venous air.

Vascular/Lymphatic: There is no abdominal aortic aneurysm. There is
aortic and iliac artery atherosclerosis. Note that the aorta is
somewhat tortuous. There is no adenopathy appreciable in the abdomen
or pelvis.

Reproductive: Prostate and seminal vesicles are normal in size and
contour. There are a few tiny prostatic calculi.

Other: The appendix appears normal. There is no evident abscess or
ascites in the abdomen or pelvis. There is slight fat in the
umbilicus.

Musculoskeletal: There is degenerative change in the lumbar spine.
No blastic or lytic bone lesions. No intramuscular lesions evident.
IMPRESSION: 1. There is a cyst arising from the lower pole left kidney measuring
6.2 x 4.7 cm. This cyst abuts and minimally displaces the proximal
left ureter. This finding conceivably could be a cause for left
flank pain. No appreciable hydronephrosis on either side noted. No
renal or ureteral calculus on either side.

2. No bowel obstruction. No abscess in the abdomen or pelvis.
Appendix appears normal.

3. Aortic Atherosclerosis (4UOXH-G4T.T). There is iliac artery
atherosclerotic calcification as well.

## 2020-11-17 ENCOUNTER — Ambulatory Visit: Payer: Medicare Other | Admitting: Urology

## 2020-11-25 ENCOUNTER — Other Ambulatory Visit: Payer: Medicare Other

## 2020-12-02 ENCOUNTER — Ambulatory Visit (INDEPENDENT_AMBULATORY_CARE_PROVIDER_SITE_OTHER): Payer: Medicare Other | Admitting: Orthopaedic Surgery

## 2020-12-02 ENCOUNTER — Other Ambulatory Visit: Payer: Self-pay

## 2020-12-02 ENCOUNTER — Encounter: Payer: Self-pay | Admitting: Orthopaedic Surgery

## 2020-12-02 VITALS — Ht 74.0 in | Wt 280.0 lb

## 2020-12-02 DIAGNOSIS — M25552 Pain in left hip: Secondary | ICD-10-CM

## 2020-12-02 NOTE — Progress Notes (Signed)
   Office Visit Note   Patient: Jose Joseph           Date of Birth: 11/04/1955           MRN: [id number] Visit Date: 12/02/2020              Requested by: Carylon Perches, MD [address] [place],  [place] [address] PCP: Carylon Perches, MD   Assessment & Plan: Visit Diagnoses:  1. Pain in left hip     Plan: Mr. Senteno has end-stage osteoarthritis of his left hip by prior films performed in July 2021.  There is end-stage osteoarthritis with significant joint space narrowing particularly superiorly where there was no joint space remaining.  He is reached a point where he is compromised in his activities.  He finally has someone to be with him at home and would like to consider showing hip replacement.  I will refer him to Dr. Ophelia Charter.  No charge for today's visit  Follow-Up Instructions: Return We will see Dr. Ophelia Charter tomorrow for consideration of hip replacement.   Orders:  No orders of the defined types were placed in this encounter.  No orders of the defined types were placed in this encounter.     Procedures: No procedures performed   Clinical Data: No additional findings.   Subjective: Chief Complaint  Patient presents with  . Left Hip - Pain  Patient presents today for his left hip pain. He was here in July of last year and spoke with Dr.Wilhelmenia Addis concerning his hip. He wants to talk about replacement today.   HPI  Review of Systems   Objective: Vital Signs: Ht 6\' 2"  (1.88 m)   Wt 280 lb (127 kg)   BMI 35.95 kg/m   Physical Exam  Ortho Exam ambulates with a cane in his right hand to protect his left hip.  Has a duck waddling gait related to an antalgic gait.  Significant loss of motion left hip with pain.  Specialty Comments:  No specialty comments available.  Imaging: No results found.   PMFS History: Patient Active Problem List   Diagnosis Date Noted  . Elevated PSA 04/10/2020  . Pain in left hip 02/26/2020  . Pain in right shoulder 02/26/2020    Past Medical History:  Diagnosis Date  . Arthritis   . Depression     Family History  Problem Relation Age of Onset  . Kidney cancer Sister   . Prostate cancer Brother     Past Surgical History:  Procedure Laterality Date  . KNEE ARTHROCENTESIS    . NECK SURGERY    . WRIST ARTHROPLASTY     Social History   Occupational History  . Occupation: retired  Tobacco Use  . Smoking status: Former Smoker    Packs/day: 1.00    Years: 35.00    Pack years: 35.00    Types: Cigarettes    Quit date: 2014    Years since quitting: 8.3  . Smokeless tobacco: Never Used  Substance and Sexual Activity  . Alcohol use: Yes    Alcohol/week: 2.0 standard drinks    Types: 2 Cans of beer per week  . Drug use: Not on file  . Sexual activity: Not on file

## 2020-12-03 ENCOUNTER — Ambulatory Visit: Payer: Medicare Other | Admitting: Urology

## 2020-12-03 ENCOUNTER — Ambulatory Visit (INDEPENDENT_AMBULATORY_CARE_PROVIDER_SITE_OTHER): Payer: Medicare Other | Admitting: Orthopaedic Surgery

## 2020-12-03 ENCOUNTER — Other Ambulatory Visit: Payer: Self-pay

## 2020-12-03 ENCOUNTER — Ambulatory Visit (INDEPENDENT_AMBULATORY_CARE_PROVIDER_SITE_OTHER): Payer: Medicare Other

## 2020-12-03 DIAGNOSIS — M25552 Pain in left hip: Secondary | ICD-10-CM

## 2020-12-03 DIAGNOSIS — M1612 Unilateral primary osteoarthritis, left hip: Secondary | ICD-10-CM | POA: Diagnosis not present

## 2020-12-03 NOTE — Progress Notes (Signed)
Office Visit Note   Patient: Jose Joseph           Date of Birth: 1956/02/21           MRN: [id number] Visit Date: 12/03/2020              Requested by: Carylon Perches, MD [address] [place],  [place] [address] PCP: Carylon Perches, MD   Assessment & Plan: Visit Diagnoses:  1. Pain in left hip   2. Unilateral primary osteoarthritis, left hip     Plan: Patient is ready proceed with left total hip arthroplasty we discussed spinal anesthesia, direct anterior approach likely overnight stay.  Use of a walker postoperatively then progressing to a cane several weeks later.  Procedure discussed.  He has been active healthy no cardiac renal or pulmonary problems.  He sees Dr. Ouida Sills for primary care.  We discussed operative technique risks of surgery.  Follow-Up Instructions: No follow-ups on file.   Orders:  Orders Placed This Encounter  Procedures  . XR Pelvis 1-2 Views   No orders of the defined types were placed in this encounter.     Procedures: No procedures performed   Clinical Data: No additional findings.   Subjective: Chief Complaint  Patient presents with  . Left Hip - Pain    HPI 65 year old male with end-stage osteoarthritis bone-on-bone changes in his left hip with normal right hip.  He is using a cane and he has severe problems ambulating without the cane with pronounced Trendelenburg gait.  He has delayed surgery for over a year with his wife currently back in [place] visiting family.  Patient is used again anti-inflammatories without relief has erosion and now shortening of the left leg due to erosive wear of his hip.  No history of cardiac problems he had slight elevation of PSA which has been stable.  Patient's used ibuprofen also tramadol for his hip pain.  Review of Systems positive for seasonal allergies on Allegra.  Patient is on daily Celexa.   Objective: Vital Signs: There were no vitals taken for this visit.  Physical Exam Constitutional:       Appearance: He is well-developed.  HENT:     Head: Normocephalic and atraumatic.  Eyes:     Pupils: Pupils are equal, round, and reactive to light.  Neck:     Thyroid: No thyromegaly.     Trachea: No tracheal deviation.  Cardiovascular:     Rate and Rhythm: Normal rate.  Pulmonary:     Effort: Pulmonary effort is normal.     Breath sounds: No wheezing.  Abdominal:     General: Bowel sounds are normal.     Palpations: Abdomen is soft.  Skin:    General: Skin is warm and dry.     Capillary Refill: Capillary refill takes less than 2 seconds.  Neurological:     Mental Status: He is alert and oriented to person, place, and time.  Psychiatric:        Behavior: Behavior normal.        Thought Content: Thought content normal.        Judgment: Judgment normal.     Ortho Exam 10 degree hip flexion contracture severe pain with hip internal and external rotation limited only 10 degrees.  Pronounced Trendelenburg gait he almost hops and has great difficulty ambulating even 10 steps without his cane.  Specialty Comments:  No specialty comments available.  Imaging: XR Pelvis 1-2 Views  Result Date: 12/03/2020  Standing AP pelvis obtained.  This shows erosive changes with cystic changes in the weightbearing portion of the head and scalloping subchondral cyst formation progressive since last year.  He measures 14 mm short.  Opposite right hip has minimal spurring. Impression: Severe left hip osteoarthritis    PMFS History: Patient Active Problem List   Diagnosis Date Noted  . Unilateral primary osteoarthritis, left hip 12/03/2020  . Elevated PSA 04/10/2020  . Pain in left hip 02/26/2020  . Pain in right shoulder 02/26/2020   Past Medical History:  Diagnosis Date  . Arthritis   . Depression     Family History  Problem Relation Age of Onset  . Kidney cancer Sister   . Prostate cancer Brother     Past Surgical History:  Procedure Laterality Date  . KNEE ARTHROCENTESIS    .  NECK SURGERY    . WRIST ARTHROPLASTY     Social History   Occupational History  . Occupation: retired  Tobacco Use  . Smoking status: Former Smoker    Packs/day: 1.00    Years: 35.00    Pack years: 35.00    Types: Cigarettes    Quit date: 2014    Years since quitting: 8.3  . Smokeless tobacco: Never Used  Substance and Sexual Activity  . Alcohol use: Yes    Alcohol/week: 2.0 standard drinks    Types: 2 Cans of beer per week  . Drug use: Not on file  . Sexual activity: Not on file

## 2021-01-07 ENCOUNTER — Ambulatory Visit (INDEPENDENT_AMBULATORY_CARE_PROVIDER_SITE_OTHER): Payer: Medicare Other | Admitting: Surgery

## 2021-01-07 ENCOUNTER — Encounter: Payer: Self-pay | Admitting: Surgery

## 2021-01-07 ENCOUNTER — Other Ambulatory Visit: Payer: Self-pay

## 2021-01-07 VITALS — BP 138/84 | HR 73 | Ht 74.0 in | Wt 265.0 lb

## 2021-01-07 DIAGNOSIS — M1612 Unilateral primary osteoarthritis, left hip: Secondary | ICD-10-CM

## 2021-01-07 NOTE — Progress Notes (Signed)
65 year old white male history of end-stage DJD left hip comes in for preop evaluation.  States that symptoms unchanged from previous visit.  He is wanting to proceed with left total hip replacement as scheduled.  Today history and physical performed.  Review of systems negative.  Surgical procedure discussed along with potential hospital stay.  All questions answered.

## 2021-01-08 NOTE — Progress Notes (Signed)
Surgical Instructions    Your procedure is scheduled on Friday, June 3rd, 2022  Report to [facility] Main Entrance "A" at 07:50 A.M., then check in with the Admitting office.  Call this number if you have problems the morning of surgery:  [phone]   If you have any questions prior to your surgery date call [phone]: Open Monday-Friday 8am-4pm    Remember:  Do not eat after midnight the night before your surgery  You may drink clear liquids until 06:50 the morning of your surgery.   Clear liquids allowed are: Water, Non-Citrus Juices (without pulp), Carbonated Beverages, Clear Tea, Black Coffee Only, and Gatorade   Patient Instructions  . The night before surgery:  o No food after midnight. ONLY clear liquids after midnight  . The day of surgery (if you do NOT have diabetes):  o Drink ONE (1) Pre-Surgery Clear Ensure by 06:50 A.M. the morning of surgery. Drink in one sitting. Do not sip.  o This drink was given to you during your hospital  pre-op appointment visit.  o Nothing else to drink after completing the  Pre-Surgery Clear Ensure.         If you have questions, please contact your surgeon's office.   Take these medicines the morning of surgery with A SIP OF WATER   cetirizine (ZYRTEC)  citalopram (CELEXA) fexofenadine (ALLEGRA)   If needed:   traMADol (ULTRAM)   As of today, STOP taking any Aspirin (unless otherwise instructed by your surgeon) Aleve, Naproxen, Ibuprofen, Motrin, Advil, Goody's, BC's, all herbal medications, fish oil, and all vitamins.                     Do not wear jewelry            Do not wear lotions, powders, colognes, or deodorant.            Men may shave face and neck.            Do not bring valuables to the hospital.            [facility] is not responsible for any belongings or valuables.  Do NOT Smoke (Tobacco/Vaping) or drink Alcohol 24 hours prior to your procedure If you use a CPAP at night, you may bring all equipment  for your overnight stay.   Contacts, glasses, dentures or bridgework may not be worn into surgery, please bring cases for these belongings   For patients admitted to the hospital, discharge time will be determined by your treatment team.   Patients discharged the day of surgery will not be allowed to drive home, and someone needs to stay with them for 24 hours.    Special instructions:    Oral Hygiene is also important to reduce your risk of infection.  Remember - BRUSH YOUR TEETH THE MORNING OF SURGERY WITH YOUR REGULAR TOOTHPASTE   [place]- Preparing For Surgery  Before surgery, you can play an important role. Because skin is not sterile, your skin needs to be as free of germs as possible. You can reduce the number of germs on your skin by washing with CHG (chlorahexidine gluconate) Soap before surgery.  CHG is an antiseptic cleaner which kills germs and bonds with the skin to continue killing germs even after washing.     Please do not use if you have an allergy to CHG or antibacterial soaps. If your skin becomes reddened/irritated stop using the CHG.  Do not shave (including  legs and underarms) for at least 48 hours prior to first CHG shower. It is OK to shave your face.  Please follow these instructions carefully.    1.  Shower the NIGHT BEFORE SURGERY and the MORNING OF SURGERY with CHG Soap.   If you chose to wash your hair, wash your hair first as usual with your normal shampoo. After you shampoo, rinse your hair and body thoroughly to remove the shampoo.  Then [employer] and genitals (private parts) with your normal soap and rinse thoroughly to remove soap.  2. After that Use CHG Soap as you would any other liquid soap. You can apply CHG directly to the skin and wash gently with a scrungie or a clean washcloth.   3. Apply the CHG Soap to your body ONLY FROM THE NECK DOWN.  Do not use on open wounds or open sores. Avoid contact with your eyes, ears, mouth and genitals  (private parts). Wash Face and genitals (private parts)  with your normal soap.   4. Wash thoroughly, paying special attention to the area where your surgery will be performed.  5. Thoroughly rinse your body with warm water from the neck down.  6. DO NOT shower/wash with your normal soap after using and rinsing off the CHG Soap.  7. Pat yourself dry with a CLEAN TOWEL.  8. Wear CLEAN PAJAMAS to bed the night before surgery  9. Place CLEAN SHEETS on your bed the night before your surgery  10. DO NOT SLEEP WITH PETS.   Day of Surgery:  Take a shower with CHG soap. Wear Clean/Comfortable clothing the morning of surgery Do not apply any deodorants/lotions.   Remember to brush your teeth WITH YOUR REGULAR TOOTHPASTE.   Please read over the following fact sheets that you were given.

## 2021-01-12 ENCOUNTER — Ambulatory Visit (HOSPITAL_COMMUNITY)
Admission: RE | Admit: 2021-01-12 | Discharge: 2021-01-12 | Disposition: A | Discharge status: Home / Self Care | Payer: Medicare Other | Source: Ambulatory Visit | Attending: Surgery | Admitting: Surgery

## 2021-01-12 ENCOUNTER — Encounter (HOSPITAL_COMMUNITY): Payer: Self-pay

## 2021-01-12 ENCOUNTER — Encounter (HOSPITAL_COMMUNITY)
Admission: RE | Admit: 2021-01-12 | Discharge: 2021-01-12 | Disposition: A | Discharge status: Home / Self Care | Payer: Medicare Other | Source: Ambulatory Visit | Attending: Orthopaedic Surgery | Admitting: Orthopaedic Surgery

## 2021-01-12 ENCOUNTER — Other Ambulatory Visit: Payer: Self-pay

## 2021-01-12 DIAGNOSIS — Z01818 Encounter for other preprocedural examination: Secondary | ICD-10-CM

## 2021-01-12 DIAGNOSIS — Z20822 Contact with and (suspected) exposure to covid-19: Secondary | ICD-10-CM | POA: Diagnosis not present

## 2021-01-12 HISTORY — DX: Personal history of urinary calculi: Z87.442

## 2021-01-12 HISTORY — DX: Dyspnea, unspecified: R06.00

## 2021-01-12 HISTORY — DX: Pneumonia, unspecified organism: J18.9

## 2021-01-12 LAB — CBC
HCT: 46.7 % (ref 39.0–52.0)
Hemoglobin: 15.5 g/dL (ref 13.0–17.0)
MCH: 31.2 pg (ref 26.0–34.0)
MCHC: 33.2 g/dL (ref 30.0–36.0)
MCV: 94 fL (ref 80.0–100.0)
Platelets: 221 10*3/uL (ref 150–400)
RBC: 4.97 MIL/uL (ref 4.22–5.81)
RDW: 12.1 % (ref 11.5–15.5)
WBC: 8.8 10*3/uL (ref 4.0–10.5)
nRBC: 0 % (ref 0.0–0.2)

## 2021-01-12 LAB — URINALYSIS, ROUTINE W REFLEX MICROSCOPIC
Bilirubin Urine: NEGATIVE
Glucose, UA: NEGATIVE mg/dL
Hgb urine dipstick: NEGATIVE
Ketones, ur: NEGATIVE mg/dL
Leukocytes,Ua: NEGATIVE
Nitrite: NEGATIVE
Protein, ur: NEGATIVE mg/dL
Specific Gravity, Urine: 1.019 (ref 1.005–1.030)
pH: 5 (ref 5.0–8.0)

## 2021-01-12 LAB — COMPREHENSIVE METABOLIC PANEL
ALT: 18 U/L (ref 0–44)
AST: 21 U/L (ref 15–41)
Albumin: 4 g/dL (ref 3.5–5.0)
Alkaline Phosphatase: 70 U/L (ref 38–126)
Anion gap: 10 (ref 5–15)
BUN: 17 mg/dL (ref 8–23)
CO2: 26 mmol/L (ref 22–32)
Calcium: 9.5 mg/dL (ref 8.9–10.3)
Chloride: 102 mmol/L (ref 98–111)
Creatinine, Ser: 0.79 mg/dL (ref 0.61–1.24)
GFR, Estimated: >60 mL/min (ref >60)
Glucose, Bld: 95 mg/dL (ref 70–99)
Potassium: 4.2 mmol/L (ref 3.5–5.1)
Sodium: 138 mmol/L (ref 135–145)
Total Bilirubin: 0.6 mg/dL (ref 0.3–1.2)
Total Protein: 7 g/dL (ref 6.5–8.1)

## 2021-01-12 LAB — SARS CORONAVIRUS 2 (TAT 6-24 HRS): SARS Coronavirus 2: NEGATIVE

## 2021-01-12 LAB — SURGICAL PCR SCREEN
MRSA, PCR: NEGATIVE
Staphylococcus aureus: NEGATIVE

## 2021-01-12 NOTE — Progress Notes (Signed)
PCP - Carylon Perches, MD Cardiologist - denies  PPM/ICD - denies Device Orders - N/A Rep Notified - N/A  Chest x-ray - 01/12/2021 EKG - 01/12/2021 Stress Test - denies ECHO - denies Cardiac Cath - denies  Sleep Study - denies CPAP - N/A  Fasting Blood Sugar - N/A  Blood Thinner Instructions: N/A Aspirin Instructions: Patient was instructed: As of today, STOP taking any Aspirin (unless otherwise instructed by your surgeon) Aleve, Naproxen, Ibuprofen, Motrin, Advil, Goody's, BC's, all herbal medications, fish oil, and all vitamins  ERAS Protcol - yes PRE-SURGERY Ensure - yes  COVID TEST- 01/12/2021  Anesthesia review: yes; medical/cardiac clearance  Patient denies shortness of breath, fever, cough and chest pain at PAT appointment   All instructions explained to the patient, with a verbal understanding of the material. Patient agrees to go over the instructions while at home for a better understanding. Patient also instructed to self quarantine after being tested for COVID-19. The opportunity to ask questions was provided.

## 2021-01-13 NOTE — H&P (Signed)
TOTAL HIP ADMISSION H&P  Patient is admitted for left total hip arthroplasty.  Subjective:  Chief Complaint: left hip pain  HPI: Jose Joseph, 65 y.o. male, has a history of pain and functional disability in the left hip(s) due to arthritis and patient has failed non-surgical conservative treatments for greater than 12 weeks to include NSAID's and/or analgesics, use of assistive devices, weight reduction as appropriate and activity modification.  Onset of symptoms was gradual starting 10 years ago with gradually worsening course since that time.The patient noted no past surgery on the left hip(s).  Patient currently rates pain in the left hip at 10 out of 10 with activity. Patient has night pain, worsening of pain with activity and weight bearing, trendelenberg gait, pain that interfers with activities of daily living and pain with passive range of motion. Patient has evidence of subchondral cysts, subchondral sclerosis, periarticular osteophytes and joint space narrowing by imaging studies. This condition presents safety issues increasing the risk of falls. T  There is no current active infection.  Patient Active Problem List   Diagnosis Date Noted  . Unilateral primary osteoarthritis, left hip 12/03/2020  . Elevated PSA 04/10/2020  . Pain in left hip 02/26/2020  . Pain in right shoulder 02/26/2020   Past Medical History:  Diagnosis Date  . Arthritis   . Depression   . Dyspnea    when he was smoking  . History of kidney stones    2021  . Pneumonia    during COVID in 2021    Past Surgical History:  Procedure Laterality Date  . KNEE ARTHROCENTESIS    . NECK SURGERY    . WRIST ARTHROPLASTY      No current facility-administered medications for this encounter.   Current Outpatient Medications  Medication Sig Dispense Refill Last Dose  . cetirizine (ZYRTEC) 10 MG tablet Take 10 mg by mouth in the morning.     . citalopram (CELEXA) 20 MG tablet Take 20 mg by mouth in the morning.      Marland Kitchen EPINEPHrine (EPIPEN 2-PAK) 0.3 mg/0.3 mL IJ SOAJ injection Inject 0.3 mg into the muscle as needed for anaphylaxis.     . fexofenadine (ALLEGRA) 180 MG tablet Take 180 mg by mouth in the morning.     Marland Kitchen ibuprofen (ADVIL) 200 MG tablet Take 400 mg by mouth every 6 (six) hours as needed (for pain.).     . Saw Palmetto, Serenoa repens, (SAW PALMETTO PO) Take 1 capsule by mouth in the morning.     . traMADol (ULTRAM) 50 MG tablet Take 50-100 mg by mouth every 6 (six) hours as needed (pain).      Allergies  Allergen Reactions  . Bee Venom Anaphylaxis    syncope    Social History   Tobacco Use  . Smoking status: Former Smoker    Packs/day: 1.00    Years: 35.00    Pack years: 35.00    Types: Cigarettes    Quit date: 2014    Years since quitting: 8.4  . Smokeless tobacco: Never Used  Substance Use Topics  . Alcohol use: Yes    Alcohol/week: 2.0 standard drinks    Types: 2 Cans of beer per week    Family History  Problem Relation Age of Onset  . Kidney cancer Sister   . Prostate cancer Brother      Review of Systems  Constitutional: Positive for activity change.  HENT: Negative.   Respiratory: Negative.   Cardiovascular: Negative.  Gastrointestinal: Negative.   Musculoskeletal: Positive for gait problem.  Psychiatric/Behavioral: Negative.     Objective:  Physical Exam HENT:     Head: Normocephalic.  Eyes:     Extraocular Movements: Extraocular movements intact.  Cardiovascular:     Rate and Rhythm: Regular rhythm.     Heart sounds: Normal heart sounds.  Pulmonary:     Effort: No respiratory distress.     Breath sounds: Normal breath sounds.  Musculoskeletal:        General: Tenderness present.  Neurological:     General: No focal deficit present.     Mental Status: He is alert.  Psychiatric:        Mood and Affect: Mood normal.     Vital signs in last 24 hours: Temp:  [96.6 F (35.9 C)] 96.6 F (35.9 C) (05/31 1239) Pulse Rate:  [62] 62 (05/31  1239) Resp:  [16] 16 (05/31 1239) BP: (132)/(86) 132/86 (05/31 1239) SpO2:  [99 %] 99 % (05/31 1239) Weight:  [119.7 kg] 119.7 kg (05/31 1239)  Labs:   Estimated body mass index is 33.9 kg/m as calculated from the following:   Height as of 01/12/21: 6\' 2"  (1.88 m).   Weight as of 01/12/21: 119.7 kg.   Imaging Review Plain radiographs demonstrate moderate degenerative joint disease of the left hip(s). The bone quality appears to be good for age and reported activity level.      Assessment/Plan:  End stage arthritis, left hip(s)  The patient history, physical examination, clinical judgement of the provider and imaging studies are consistent with end stage degenerative joint disease of the left hip(s) and total hip arthroplasty is deemed medically necessary. The treatment options including medical management, injection therapy, arthroscopy and arthroplasty were discussed at length. The risks and benefits of total hip arthroplasty were presented and reviewed. The risks due to aseptic loosening, infection, stiffness, dislocation/subluxation,  thromboembolic complications and other imponderables were discussed.  The patient acknowledged the explanation, agreed to proceed with the plan and consent was signed. Patient is being admitted for inpatient treatment for surgery, pain control, PT, OT, prophylactic antibiotics, VTE prophylaxis, progressive ambulation and ADL's and discharge planning.The patient is planning to be discharged home with home health services

## 2021-01-14 ENCOUNTER — Telehealth: Payer: Self-pay | Admitting: *Deleted

## 2021-01-14 NOTE — Telephone Encounter (Signed)
Ortho bundle Pre-op call completed. 

## 2021-01-14 NOTE — Care Plan (Signed)
[facility] office RNCM call to patient prior to his surgery to discuss his upcoming Left total hip arthroplasty. Patient is an Ortho bundle patient through [facility] and is agreeable to case management. He lives with his wife that will be assisting after surgery. He has a walker as well as elevated toilets in his home. He has ordered a riser with handles as well, which has not yet arrived to his home, but is coming. Do not anticipate HHPT will be needed after surgery. Reviewed post-op care instructions and will continue to follow for needs.

## 2021-01-14 NOTE — Anesthesia Preprocedure Evaluation (Addendum)
Anesthesia Evaluation  Patient identified by MRN, date of birth, ID band Patient awake    Reviewed: Allergy & Precautions, NPO status , Patient's Chart, lab work & pertinent test results  History of Anesthesia Complications Negative for: history of anesthetic complications  Airway Mallampati: II  TM Distance: >3 FB Neck ROM: Full    Dental no notable dental hx. (+) Dental Advisory Given, Teeth Intact   Pulmonary neg pulmonary ROS, former smoker,    Pulmonary exam normal        Cardiovascular negative cardio ROS Normal cardiovascular exam     Neuro/Psych negative neurological ROS     GI/Hepatic negative GI ROS, Neg liver ROS,   Endo/Other  negative endocrine ROS  Renal/GU negative Renal ROS     Musculoskeletal   Abdominal   Peds  Hematology negative hematology ROS (+)   Anesthesia Other Findings   Reproductive/Obstetrics                          Anesthesia Physical Anesthesia Plan  ASA: II  Anesthesia Plan: Spinal   Post-op Pain Management:    Induction: Intravenous  PONV Risk Score and Plan: 2 and Ondansetron and Propofol infusion  Airway Management Planned: Natural Airway, Simple Face Mask and Nasal Cannula  Additional Equipment:   Intra-op Plan:   Post-operative Plan:   Informed Consent: I have reviewed the patients History and Physical, chart, labs and discussed the procedure including the risks, benefits and alternatives for the proposed anesthesia with the patient or authorized representative who has indicated his/her understanding and acceptance.     Dental advisory given  Plan Discussed with: Anesthesiologist and CRNA  Anesthesia Plan Comments: (Patient has preop clearance from PCP Dr. Carylon Perches dated 12/04/2020.  Copy on chart.)      Anesthesia Quick Evaluation

## 2021-01-15 ENCOUNTER — Other Ambulatory Visit: Payer: Self-pay

## 2021-01-15 ENCOUNTER — Encounter (HOSPITAL_COMMUNITY): Payer: Self-pay | Admitting: Orthopaedic Surgery

## 2021-01-15 ENCOUNTER — Ambulatory Visit (HOSPITAL_COMMUNITY): Payer: Medicare Other | Admitting: Physician Assistant

## 2021-01-15 ENCOUNTER — Encounter (HOSPITAL_COMMUNITY)
Admission: RE | Disposition: A | Discharge status: Home / Self Care | Payer: Self-pay | Source: Ambulatory Visit | Attending: Orthopaedic Surgery

## 2021-01-15 ENCOUNTER — Ambulatory Visit (HOSPITAL_COMMUNITY): Payer: Medicare Other | Admitting: Anesthesiology

## 2021-01-15 ENCOUNTER — Observation Stay (HOSPITAL_COMMUNITY): Payer: Medicare Other

## 2021-01-15 ENCOUNTER — Observation Stay (HOSPITAL_COMMUNITY)
Admission: RE | Admit: 2021-01-15 | Discharge: 2021-01-16 | Disposition: A | Discharge status: Home / Self Care | Payer: Medicare Other | Source: Ambulatory Visit | Attending: Orthopaedic Surgery | Admitting: Orthopaedic Surgery

## 2021-01-15 ENCOUNTER — Ambulatory Visit (HOSPITAL_COMMUNITY): Payer: Medicare Other

## 2021-01-15 DIAGNOSIS — Z419 Encounter for procedure for purposes other than remedying health state, unspecified: Secondary | ICD-10-CM

## 2021-01-15 DIAGNOSIS — R7309 Other abnormal glucose: Secondary | ICD-10-CM | POA: Diagnosis not present

## 2021-01-15 DIAGNOSIS — Z96642 Presence of left artificial hip joint: Secondary | ICD-10-CM

## 2021-01-15 DIAGNOSIS — Z8616 Personal history of COVID-19: Secondary | ICD-10-CM | POA: Insufficient documentation

## 2021-01-15 DIAGNOSIS — Z09 Encounter for follow-up examination after completed treatment for conditions other than malignant neoplasm: Secondary | ICD-10-CM

## 2021-01-15 DIAGNOSIS — Z79899 Other long term (current) drug therapy: Secondary | ICD-10-CM | POA: Diagnosis not present

## 2021-01-15 DIAGNOSIS — Z87891 Personal history of nicotine dependence: Secondary | ICD-10-CM | POA: Diagnosis not present

## 2021-01-15 DIAGNOSIS — M1612 Unilateral primary osteoarthritis, left hip: Secondary | ICD-10-CM | POA: Diagnosis present

## 2021-01-15 HISTORY — PX: TOTAL HIP ARTHROPLASTY: SHX124

## 2021-01-15 HISTORY — DX: Presence of left artificial hip joint: Z96.642

## 2021-01-15 LAB — GLUCOSE, CAPILLARY: Glucose-Capillary: 103 mg/dL — ABNORMAL HIGH (ref 70–99)

## 2021-01-15 SURGERY — ARTHROPLASTY, HIP, TOTAL, ANTERIOR APPROACH
Anesthesia: Spinal | Site: Hip | Laterality: Left

## 2021-01-15 MED ORDER — HYDROMORPHONE HCL 1 MG/ML IJ SOLN
1.0000 mg | INTRAMUSCULAR | Status: DC | PRN
  Administered 2021-01-15 – 2021-01-16 (×2): 1 mg via INTRAVENOUS
  Filled 2021-01-15 (×2): qty 1

## 2021-01-15 MED ORDER — MIDAZOLAM HCL 5 MG/5ML IJ SOLN
INTRAMUSCULAR | Status: DC | PRN
  Administered 2021-01-15: 2 mg via INTRAVENOUS

## 2021-01-15 MED ORDER — FENTANYL CITRATE (PF) 100 MCG/2ML IJ SOLN: 25.0000 ug | INTRAMUSCULAR | Status: DC | PRN

## 2021-01-15 MED ORDER — LORATADINE 10 MG PO TABS: 10.0000 mg | ORAL_TABLET | Freq: Every day | ORAL | Status: DC

## 2021-01-15 MED ORDER — MENTHOL 3 MG MT LOZG: 1.0000 | LOZENGE | OROMUCOSAL | Status: DC | PRN

## 2021-01-15 MED ORDER — CITALOPRAM HYDROBROMIDE 20 MG PO TABS
20.0000 mg | ORAL_TABLET | Freq: Every morning | ORAL | Status: DC
  Administered 2021-01-16: 20 mg via ORAL
  Filled 2021-01-15: qty 1

## 2021-01-15 MED ORDER — DOCUSATE SODIUM 100 MG PO CAPS
100.0000 mg | ORAL_CAPSULE | Freq: Two times a day (BID) | ORAL | Status: DC
  Administered 2021-01-15 – 2021-01-16 (×2): 100 mg via ORAL
  Filled 2021-01-15 (×2): qty 1

## 2021-01-15 MED ORDER — HYDROMORPHONE HCL 1 MG/ML IJ SOLN
0.5000 mg | INTRAMUSCULAR | Status: DC | PRN
  Administered 2021-01-15: 0.5 mg via INTRAVENOUS
  Filled 2021-01-15: qty 0.5

## 2021-01-15 MED ORDER — OXYCODONE HCL 5 MG PO TABS
5.0000 mg | ORAL_TABLET | ORAL | Status: DC | PRN
  Administered 2021-01-15 – 2021-01-16 (×5): 10 mg via ORAL
  Filled 2021-01-15 (×6): qty 2

## 2021-01-15 MED ORDER — PHENOL 1.4 % MT LIQD: 1.0000 | OROMUCOSAL | Status: DC | PRN

## 2021-01-15 MED ORDER — PROPOFOL 500 MG/50ML IV EMUL
INTRAVENOUS | Status: DC | PRN
  Administered 2021-01-15: 75 ug/kg/min via INTRAVENOUS
  Administered 2021-01-15: 100 ug/kg/min via INTRAVENOUS

## 2021-01-15 MED ORDER — PHENYLEPHRINE HCL-NACL 10-0.9 MG/250ML-% IV SOLN
INTRAVENOUS | Status: DC | PRN
  Administered 2021-01-15: 25 ug/min via INTRAVENOUS

## 2021-01-15 MED ORDER — ACETAMINOPHEN 500 MG PO TABS
1000.0000 mg | ORAL_TABLET | Freq: Once | ORAL | Status: AC
  Administered 2021-01-15: 1000 mg via ORAL
  Filled 2021-01-15: qty 2

## 2021-01-15 MED ORDER — METOCLOPRAMIDE HCL 5 MG PO TABS: 5.0000 mg | ORAL_TABLET | Freq: Three times a day (TID) | ORAL | Status: DC | PRN

## 2021-01-15 MED ORDER — POLYETHYLENE GLYCOL 3350 17 G PO PACK: 17.0000 g | PACK | Freq: Every day | ORAL | Status: DC | PRN

## 2021-01-15 MED ORDER — CEFAZOLIN SODIUM-DEXTROSE 2-4 GM/100ML-% IV SOLN
INTRAVENOUS | Status: AC
  Filled 2021-01-15: qty 100

## 2021-01-15 MED ORDER — KETOROLAC TROMETHAMINE 30 MG/ML IJ SOLN
30.0000 mg | Freq: Once | INTRAMUSCULAR | Status: AC
  Administered 2021-01-15: 30 mg via INTRAVENOUS
  Filled 2021-01-15: qty 1

## 2021-01-15 MED ORDER — PHENYLEPHRINE 40 MCG/ML (10ML) SYRINGE FOR IV PUSH (FOR BLOOD PRESSURE SUPPORT)
PREFILLED_SYRINGE | INTRAVENOUS | Status: DC | PRN
  Administered 2021-01-15: 120 ug via INTRAVENOUS
  Administered 2021-01-15: 25 ug via INTRAVENOUS

## 2021-01-15 MED ORDER — BUPIVACAINE LIPOSOME 1.3 % IJ SUSP
INTRAMUSCULAR | Status: DC | PRN
  Administered 2021-01-15: 7 mL

## 2021-01-15 MED ORDER — ONDANSETRON HCL 4 MG/2ML IJ SOLN: 4.0000 mg | Freq: Four times a day (QID) | INTRAMUSCULAR | Status: DC | PRN

## 2021-01-15 MED ORDER — PROPOFOL 10 MG/ML IV BOLUS
INTRAVENOUS | Status: AC
  Filled 2021-01-15: qty 20

## 2021-01-15 MED ORDER — BUPIVACAINE IN DEXTROSE 0.75-8.25 % IT SOLN
INTRATHECAL | Status: DC | PRN
  Administered 2021-01-15: 2 mL via INTRATHECAL

## 2021-01-15 MED ORDER — BUPIVACAINE HCL (PF) 0.5 % IJ SOLN
INTRAMUSCULAR | Status: DC | PRN
  Administered 2021-01-15: 13 mL

## 2021-01-15 MED ORDER — EPINEPHRINE 0.3 MG/0.3ML IJ SOAJ: 0.3000 mg | INTRAMUSCULAR | Status: DC | PRN

## 2021-01-15 MED ORDER — BUPIVACAINE LIPOSOME 1.3 % IJ SUSP
INTRAMUSCULAR | Status: AC
  Filled 2021-01-15: qty 20

## 2021-01-15 MED ORDER — BUPIVACAINE HCL (PF) 0.5 % IJ SOLN
INTRAMUSCULAR | Status: AC
  Filled 2021-01-15: qty 30

## 2021-01-15 MED ORDER — ONDANSETRON HCL 4 MG/2ML IJ SOLN
INTRAMUSCULAR | Status: AC
  Filled 2021-01-15: qty 2

## 2021-01-15 MED ORDER — OXYCODONE HCL 5 MG PO TABS
ORAL_TABLET | ORAL | Status: AC
  Administered 2021-01-15: 5 mg via ORAL
  Filled 2021-01-15: qty 2

## 2021-01-15 MED ORDER — MIDAZOLAM HCL 2 MG/2ML IJ SOLN
INTRAMUSCULAR | Status: AC
  Filled 2021-01-15: qty 2

## 2021-01-15 MED ORDER — SODIUM CHLORIDE 0.9 % IV SOLN: INTRAVENOUS | Status: DC

## 2021-01-15 MED ORDER — FENTANYL CITRATE (PF) 250 MCG/5ML IJ SOLN
INTRAMUSCULAR | Status: AC
  Filled 2021-01-15: qty 5

## 2021-01-15 MED ORDER — STERILE WATER FOR IRRIGATION IR SOLN
Status: DC | PRN
  Administered 2021-01-15: 1000 mL

## 2021-01-15 MED ORDER — CELECOXIB 200 MG PO CAPS
200.0000 mg | ORAL_CAPSULE | Freq: Once | ORAL | Status: AC
  Administered 2021-01-15: 200 mg via ORAL
  Filled 2021-01-15: qty 1

## 2021-01-15 MED ORDER — ACETAMINOPHEN 325 MG PO TABS
325.0000 mg | ORAL_TABLET | Freq: Four times a day (QID) | ORAL | Status: DC | PRN
Start: 2021-01-16 — End: 2021-01-16

## 2021-01-15 MED ORDER — ONDANSETRON HCL 4 MG/2ML IJ SOLN
INTRAMUSCULAR | Status: DC | PRN
  Administered 2021-01-15: 4 mg via INTRAVENOUS

## 2021-01-15 MED ORDER — BUPIVACAINE LIPOSOME 1.3 % IJ SUSP
10.0000 mL | Freq: Once | INTRAMUSCULAR | Status: DC
  Filled 2021-01-15: qty 10

## 2021-01-15 MED ORDER — METHOCARBAMOL 500 MG PO TABS
500.0000 mg | ORAL_TABLET | Freq: Four times a day (QID) | ORAL | Status: DC | PRN
  Administered 2021-01-15 – 2021-01-16 (×4): 500 mg via ORAL
  Filled 2021-01-15 (×4): qty 1

## 2021-01-15 MED ORDER — 0.9 % SODIUM CHLORIDE (POUR BTL) OPTIME
TOPICAL | Status: DC | PRN
  Administered 2021-01-15: 1000 mL

## 2021-01-15 MED ORDER — LORATADINE 10 MG PO TABS
10.0000 mg | ORAL_TABLET | Freq: Every day | ORAL | Status: DC
  Administered 2021-01-16: 10 mg via ORAL
  Filled 2021-01-15: qty 1

## 2021-01-15 MED ORDER — CHLORHEXIDINE GLUCONATE 0.12 % MT SOLN: 15.0000 mL | Freq: Once | OROMUCOSAL | Status: AC

## 2021-01-15 MED ORDER — ONDANSETRON HCL 4 MG PO TABS: 4.0000 mg | ORAL_TABLET | Freq: Four times a day (QID) | ORAL | Status: DC | PRN

## 2021-01-15 MED ORDER — ASPIRIN EC 325 MG PO TBEC
325.0000 mg | DELAYED_RELEASE_TABLET | Freq: Every day | ORAL | Status: DC
  Administered 2021-01-16: 325 mg via ORAL
  Filled 2021-01-15: qty 1

## 2021-01-15 MED ORDER — LACTATED RINGERS IV SOLN: INTRAVENOUS | Status: DC

## 2021-01-15 MED ORDER — MIDAZOLAM HCL 5 MG/5ML IJ SOLN: INTRAMUSCULAR | Status: DC | PRN

## 2021-01-15 MED ORDER — PROPOFOL 500 MG/50ML IV EMUL: INTRAVENOUS | Status: DC | PRN

## 2021-01-15 MED ORDER — METHOCARBAMOL 500 MG PO TABS
ORAL_TABLET | ORAL | Status: AC
  Filled 2021-01-15: qty 1

## 2021-01-15 MED ORDER — METOCLOPRAMIDE HCL 5 MG/ML IJ SOLN: 5.0000 mg | Freq: Three times a day (TID) | INTRAMUSCULAR | Status: DC | PRN

## 2021-01-15 MED ORDER — METHOCARBAMOL 1000 MG/10ML IJ SOLN
500.0000 mg | Freq: Four times a day (QID) | INTRAVENOUS | Status: DC | PRN
  Filled 2021-01-15: qty 5

## 2021-01-15 MED ORDER — TRANEXAMIC ACID-NACL 1000-0.7 MG/100ML-% IV SOLN
INTRAVENOUS | Status: DC | PRN
  Administered 2021-01-15: 1000 mg via INTRAVENOUS

## 2021-01-15 MED ORDER — DEXAMETHASONE SODIUM PHOSPHATE 10 MG/ML IJ SOLN
INTRAMUSCULAR | Status: AC
  Filled 2021-01-15: qty 1

## 2021-01-15 MED ORDER — PROPOFOL 10 MG/ML IV BOLUS
INTRAVENOUS | Status: DC | PRN
  Administered 2021-01-15: 20 mg via INTRAVENOUS

## 2021-01-15 MED ORDER — LIDOCAINE 2% (20 MG/ML) 5 ML SYRINGE
INTRAMUSCULAR | Status: AC
  Filled 2021-01-15: qty 5

## 2021-01-15 MED ORDER — CHLORHEXIDINE GLUCONATE 0.12 % MT SOLN
OROMUCOSAL | Status: AC
  Administered 2021-01-15: 15 mL via OROMUCOSAL
  Filled 2021-01-15: qty 15

## 2021-01-15 MED ORDER — TRANEXAMIC ACID-NACL 1000-0.7 MG/100ML-% IV SOLN
INTRAVENOUS | Status: AC
  Filled 2021-01-15: qty 100

## 2021-01-15 MED ORDER — CEFAZOLIN SODIUM-DEXTROSE 2-4 GM/100ML-% IV SOLN
2.0000 g | INTRAVENOUS | Status: AC
  Administered 2021-01-15: 2 g via INTRAVENOUS

## 2021-01-15 MED ORDER — ORAL CARE MOUTH RINSE: 15.0000 mL | Freq: Once | OROMUCOSAL | Status: AC

## 2021-01-15 SURGICAL SUPPLY — 56 items
APL SKNCLS STERI-STRIP NONHPOA (GAUZE/BANDAGES/DRESSINGS) ×1
BALL HIP ARTICU EZE 36 8.5 (Hips) IMPLANT
BENZOIN TINCTURE PRP APPL 2/3 (GAUZE/BANDAGES/DRESSINGS) ×2 IMPLANT
BLADE CLIPPER SURG (BLADE) IMPLANT
BLADE SAW SGTL 18X1.27X75 (BLADE) ×2 IMPLANT
CELLS DAT CNTRL 66122 CELL SVR (MISCELLANEOUS) ×1 IMPLANT
COVER SURGICAL LIGHT HANDLE (MISCELLANEOUS) ×2 IMPLANT
COVER WAND RF STERILE (DRAPES) ×2 IMPLANT
CUP ACET PNNCL SECTR W/GRIP 56 (Hips) IMPLANT
DRAPE C-ARM 42X72 X-RAY (DRAPES) ×2 IMPLANT
DRAPE IMP U-DRAPE 54X76 (DRAPES) ×2 IMPLANT
DRAPE STERI IOBAN 125X83 (DRAPES) ×2 IMPLANT
DRAPE U-SHAPE 47X51 STRL (DRAPES) ×6 IMPLANT
DRSG MEPILEX BORDER 4X12 (GAUZE/BANDAGES/DRESSINGS) ×2 IMPLANT
DRSG MEPILEX BORDER 4X8 (GAUZE/BANDAGES/DRESSINGS) ×2 IMPLANT
DRSG MEPITEL 8X12 (GAUZE/BANDAGES/DRESSINGS) ×1 IMPLANT
DURAPREP 26ML APPLICATOR (WOUND CARE) ×2 IMPLANT
ELECT BLADE 4.0 EZ CLEAN MEGAD (MISCELLANEOUS)
ELECT CAUTERY BLADE 6.4 (BLADE) ×2 IMPLANT
ELECT REM PT RETURN 9FT ADLT (ELECTROSURGICAL) ×2
ELECTRODE BLDE 4.0 EZ CLN MEGD (MISCELLANEOUS) IMPLANT
ELECTRODE REM PT RTRN 9FT ADLT (ELECTROSURGICAL) ×1 IMPLANT
ELIMINATOR HOLE APEX DEPUY (Hips) ×1 IMPLANT
FACESHIELD WRAPAROUND (MASK) ×4 IMPLANT
FACESHIELD WRAPAROUND OR TEAM (MASK) ×2 IMPLANT
GLOVE ORTHO TXT STRL SZ7.5 (GLOVE) ×4 IMPLANT
GLOVE SRG 8 PF TXTR STRL LF DI (GLOVE) ×2 IMPLANT
GLOVE SURG UNDER POLY LF SZ8 (GLOVE) ×4
GOWN STRL REUS W/ TWL LRG LVL3 (GOWN DISPOSABLE) ×1 IMPLANT
GOWN STRL REUS W/ TWL XL LVL3 (GOWN DISPOSABLE) ×1 IMPLANT
GOWN STRL REUS W/TWL 2XL LVL3 (GOWN DISPOSABLE) ×2 IMPLANT
GOWN STRL REUS W/TWL LRG LVL3 (GOWN DISPOSABLE) ×2
GOWN STRL REUS W/TWL XL LVL3 (GOWN DISPOSABLE) ×2
HIP BALL ARTICU EZE 36 8.5 (Hips) ×2 IMPLANT
KIT BASIN OR (CUSTOM PROCEDURE TRAY) ×2 IMPLANT
KIT TURNOVER KIT B (KITS) ×2 IMPLANT
MANIFOLD NEPTUNE II (INSTRUMENTS) ×2 IMPLANT
NS IRRIG 1000ML POUR BTL (IV SOLUTION) ×2 IMPLANT
PACK TOTAL JOINT (CUSTOM PROCEDURE TRAY) ×2 IMPLANT
PAD ARMBOARD 7.5X6 YLW CONV (MISCELLANEOUS) ×4 IMPLANT
PINN SECTOR W/GRIP ACE CUP 56 (Hips) ×2 IMPLANT
PINNACLE ALTRX PLUS 4 N 36X56 (Hips) ×1 IMPLANT
RTRCTR WOUND ALEXIS 18CM MED (MISCELLANEOUS) ×2
STEM FEM ACTIS HIGH SZ7 (Stem) ×1 IMPLANT
STRIP CLOSURE SKIN 1/2X4 (GAUZE/BANDAGES/DRESSINGS) ×2 IMPLANT
SUT VIC AB 0 CT1 27 (SUTURE) ×2
SUT VIC AB 0 CT1 27XBRD ANBCTR (SUTURE) ×1 IMPLANT
SUT VIC AB 2-0 CT1 27 (SUTURE) ×2
SUT VIC AB 2-0 CT1 TAPERPNT 27 (SUTURE) ×1 IMPLANT
SUT VICRYL 4-0 PS2 18IN ABS (SUTURE) ×2 IMPLANT
SUT VLOC 180 0 24IN GS25 (SUTURE) ×2 IMPLANT
TOWEL GREEN STERILE (TOWEL DISPOSABLE) ×4 IMPLANT
TOWEL GREEN STERILE FF (TOWEL DISPOSABLE) ×2 IMPLANT
TRAY CATH 16FR W/PLASTIC CATH (SET/KITS/TRAYS/PACK) IMPLANT
TRAY FOLEY MTR SLVR 16FR STAT (SET/KITS/TRAYS/PACK) IMPLANT
WATER STERILE IRR 1000ML POUR (IV SOLUTION) ×4 IMPLANT

## 2021-01-15 NOTE — Op Note (Signed)
Preop diagnosis :left hip primary osteoarthritis  Postop diagnosis: Same  Procedure: Left total hip arthroplasty direct anterior approach.  Surgeon: Ophelia Charter MD  Assistant: Zonia Kief, PA-C medically necessary and present for the entire procedure  Anesthesia spinal plus Exparel and Marcaine 10+10 = 20 cc.  EBL 200 cc  Drains: None  Implants:PINN SECTOR W/GRIP ACE CUP 56 - [id number]  Inventory Item: PINN SECTOR W/GRIP ACE CUP 56 Serial no.:  Model/Cat no.: [id number]  Implant name: PINN SECTOR W/GRIP ACE CUP 56 - [id number] Laterality: Left Area: Hip  Manufacturer: [employer] Date of Manufacture:    Action: Implanted Number Used: 1   Device Identifier:  Device Identifier TypeWaylan Rocher APEX DEPUY - [id number]  Inventory Item: ELIMINATOR HOLE APEX DEPUY Serial no.:  Model/Cat no.: [id number]  Implant nameWaylan Rocher APEX DEPUY - [id number] Laterality: Left Area: Hip  Manufacturer: [employer] Date of Manufacture:    Action: Implanted Number Used: 1   Device Identifier:  Device Identifier Type:     PINNACLE ALTRX PLUS 4 N [id number] - [id number]  Inventory Item: PINNACLE ALTRX PLUS 4 N [id number] Serial no.:  Model/Cat no.: [id number]  Implant name: PINNACLE ALTRX PLUS 4 N [id number] - [id number] Laterality: Left Area: Hip  Manufacturer: [employer] Date of Manufacture:    Action: Implanted Number Used: 1   Device Identifier:  Device Identifier Type:     STEM FEM ACTIS HIGH SZ7 - [id number]  Inventory Item: STEM FEM ACTIS HIGH SZ7 Serial no.:  Model/Cat no.: [id number]  Implant name: STEM FEM ACTIS HIGH SZ7 - [id number] Laterality: Left Area: Hip  Manufacturer: [employer] Date of Manufacture:    Action: Implanted Number Used: 1   Device Identifier:  Device Identifier Type:     HIP BALL ARTICU EZE 36 8.5 - [id number]  Inventory Item: HIP BALL ARTICU EZE 36 8.5 Serial no.:  Model/Cat no.: [id number]  Implant name: HIP BALL ARTICU EZE 36 8.5 - [id number]  Laterality: Left Area: Hip  Manufacturer: [employer] Date of Manufacture:    Action: Implanted Number Used: 1   Device Identifier:  Device Identifier Type:     Procedure: After induction of spinal anesthesia Hana boots Foley catheter placement on the Hana table with IV TXA Ancef 2 g prophylaxis standard prepping and draping after C-arm visualization was performed with DuraPrep.  Usual total hip sheets drapes were applied large shower curtain Betadine Steri-Drape was applied after sterile skin marker.  Timeout procedure completed.  Oblique incision starting 1 fingerbreadth lateral 1 inferior to the ASIS obliquely over the trochanter fascia was identified nicked extended and blunt cobra placed over the top the capsule anterior capsule was opened neck was cut under C-arm visualization with a 10 or 11 mm neck length.  Head was removed with some difficulty with a corkscrew due to severe osteophytes.  Marginal osteophytes were removed off the acetabulum with an osteotome and mallet.  Sequential reaming was performed for placement of a 56 mm Gripton cup after reaming to 55.  Cup was inserted under C-arm visualization for abduction and appropriate PIP flexion.  +4 neutral liner after apex illuminator was inserted.  No screw was needed since there was excellent tight fit and good stability.  Hydraulic Adriana Simas was applied hip was externally rotated 120 taken down under and in standard preparation the canal.  Patient was 14 mm short and we progressed gradually up to a #7 stem.  +8.5 ball was used since patient was slightly short  and collar was flushed with the calcar.  It was extremely difficult to reduce the hip and I do not think a 10 mm hip ball would have been able to be placed without more releasing.  Hip actually had to be dislocated and reduced with about 20 degrees hip extension since then neutral position even with bone hook and maximum traction it was difficult.  There was good stability external rotation  90 degrees leg with boot being taken straight down to the floor was still stable hip.  Permanent ball 8.5 was placed.  Final spot pictures were taken good position alignment leg lengths appeared equal on AP fluoroscopic image.  Copious irrigation read coagulation of the transverse artery.  V-Loc closure fascia 2 on subtenons tissue skin staple closure postop dressing and transfer the care room.

## 2021-01-15 NOTE — Anesthesia Procedure Notes (Signed)
Procedure Name: MAC Date/Time: 01/15/2021 10:50 AM Performed by: Jenne Campus, CRNA Pre-anesthesia Checklist: Patient identified, Emergency Drugs available, Suction available and Patient being monitored Oxygen Delivery Method: Simple face mask

## 2021-01-15 NOTE — Care Plan (Signed)
Ortho Bundle Case Management Note  Patient Details  Name: Jose Joseph MRN: [id number] Date of Birth: 29-Mar-1956   [facility] office RNCM call to patient prior to his surgery to discuss his upcoming Left total hip arthroplasty. Patient is an Ortho bundle patient through [facility] and is agreeable to case management. He lives with his wife that will be assisting after surgery. He has a walker as well as elevated toilets in his home. He has ordered a riser with handles as well, which has not yet arrived to his home, but is coming. Do not anticipate HHPT will be needed after surgery. Reviewed post-op care instructions and will continue to follow for needs.                DME Arranged:   (Patient verbalized before surgery he has RW, elevated toilets and a riser with handrails, cane) DME Agency:     HH Arranged:    HH Agency:     Additional Comments: Please contact me with any questions of if this plan should need to change.  Ralph Dowdy, RN, BSN, [employer]  [phone] 01/15/2021, 2:04 PM

## 2021-01-15 NOTE — Interval H&P Note (Signed)
History and Physical Interval Note:  01/15/2021 9:38 AM  Jose Joseph  has presented today for surgery, with the diagnosis of left hip osteoarthritis.  The various methods of treatment have been discussed with the patient and family. After consideration of risks, benefits and other options for treatment, the patient has consented to  Procedure(s): LEFT TOTAL HIP ARTHROPLASTY ANTERIOR APPROACH (Left) as a surgical intervention.  The patient's history has been reviewed, patient examined, no change in status, stable for surgery.  I have reviewed the patient's chart and labs.  Questions were answered to the patient's satisfaction.     Eldred Manges

## 2021-01-15 NOTE — Anesthesia Postprocedure Evaluation (Signed)
Anesthesia Post Note  Patient: SOLMON BOHR  Procedure(s) Performed: LEFT TOTAL HIP ARTHROPLASTY ANTERIOR APPROACH (Left Hip)     Patient location during evaluation: PACU Anesthesia Type: Spinal Level of consciousness: awake and alert Pain management: pain level controlled Vital Signs Assessment: post-procedure vital signs reviewed and stable Respiratory status: spontaneous breathing and respiratory function stable Cardiovascular status: blood pressure returned to baseline and stable Postop Assessment: spinal receding Anesthetic complications: no   No complications documented.  Last Vitals:  Vitals:   01/15/21 1345 01/15/21 1400  BP: 105/78 117/78  Pulse: (!) 56 68  Resp: (!) 8 15  Temp:    SpO2: 97% 99%    Last Pain:  Vitals:   01/15/21 1400  TempSrc:   PainSc: 5     LLE Motor Response: Purposeful movement (01/15/21 1400) LLE Sensation: Decreased;Tingling (01/15/21 1400) RLE Motor Response: Purposeful movement (01/15/21 1400) RLE Sensation: Decreased;Tingling (01/15/21 1400) L Sensory Level: S1-Sole of foot, small toes (01/15/21 1400) R Sensory Level: S1-Sole of foot, small toes (01/15/21 1400)  Abie Cheek DANIEL

## 2021-01-15 NOTE — Transfer of Care (Signed)
Immediate Anesthesia Transfer of Care Note  Patient: Jose Joseph  Procedure(s) Performed: LEFT TOTAL HIP ARTHROPLASTY ANTERIOR APPROACH (Left Hip)  Patient Location: PACU  Anesthesia Type:MAC and Spinal  Level of Consciousness: awake, oriented and patient cooperative  Airway & Oxygen Therapy: Patient Spontanous Breathing and Patient connected to face mask oxygen  Post-op Assessment: Report given to RN and Post -op Vital signs reviewed and stable  Post vital signs: Reviewed  Last Vitals:  Vitals Value Taken Time  BP 110/64 01/15/21 1301  Temp    Pulse 62 01/15/21 1306  Resp 10 01/15/21 1306  SpO2 100 % 01/15/21 1306  Vitals shown include unvalidated device data.  Last Pain:  Vitals:   01/15/21 0858  TempSrc:   PainSc: 0-No pain         Complications: No complications documented.

## 2021-01-15 NOTE — Anesthesia Procedure Notes (Signed)
Spinal  Patient location during procedure: OR Start time: 01/15/2021 10:21 AM End time: 01/15/2021 10:31 AM Reason for block: surgical anesthesia Staffing Performed: anesthesiologist  Anesthesiologist: Heather Roberts, MD Preanesthetic Checklist Completed: patient identified, IV checked, risks and benefits discussed, surgical consent, monitors and equipment checked, pre-op evaluation and timeout performed Spinal Block Patient position: sitting Prep: DuraPrep Patient monitoring: cardiac monitor, continuous pulse ox and blood pressure Approach: midline Location: L2-3 Injection technique: single-shot Needle Needle type: Pencan  Needle gauge: 24 G Needle length: 9 cm Assessment Events: CSF return Additional Notes Functioning IV was confirmed and monitors were applied. Sterile prep and drape, including hand hygiene and sterile gloves were used. The patient was positioned and the spine was prepped. The skin was anesthetized with lidocaine.  Free flow of clear CSF was obtained prior to injecting local anesthetic into the CSF.  The spinal needle aspirated freely following injection.  The needle was carefully withdrawn.  The patient tolerated the procedure well.

## 2021-01-15 NOTE — Evaluation (Signed)
Physical Therapy Evaluation Patient Details Name: Jose Joseph MRN: [id number] DOB: 26-Mar-1956 Today's Date: 01/15/2021   History of Present Illness  Pt is 65 yo male s/p L anterior THA on 01/15/21.  Pt with pmh including OA, dyspnea, and neck surgery.  Clinical Impression  Pt is s/p THA resulting in the deficits listed below (see PT Problem List). Pt ambulating 60' with RW and min A to steady.  He was in extreme pain in supine that was relieved once sitting EOB and he was able to ambulate.  Pt normally independent and has 24 hr assist at home.  He has RW and has toilet riser ordered but it has not arrived.  With pt's height of 6'2" if riser does not arrive due to current shipping delays, may benefit from [facility].  Pt will benefit from skilled PT to increase their independence and safety with mobility to allow discharge to the venue listed below.      Follow Up Recommendations Follow surgeon's recommendation for DC plan and follow-up therapies;Supervision for mobility/OOB    Equipment Recommendations  Other (comment) (potential 3 in 1 if his toilet riser does not arrive at home (unsure when considering current shipping issues))    Recommendations for Other Services       Precautions / Restrictions Precautions Precautions: Fall Restrictions Weight Bearing Restrictions: Yes LLE Weight Bearing: Weight bearing as tolerated      Mobility  Bed Mobility Overal bed mobility: Needs Assistance Bed Mobility: Supine to Sit     Supine to sit: Min assist     General bed mobility comments: assist for L LE    Transfers Overall transfer level: Needs assistance Equipment used: Rolling walker (2 wheeled) Transfers: Sit to/from Stand Sit to Stand: Min assist;From elevated surface         General transfer comment: min A for steadying RW and from significantly elevated surface  Ambulation/Gait Ambulation/Gait assistance: Min assist Gait Distance (Feet): 60 Feet Assistive device: Rolling  walker (2 wheeled) Gait Pattern/deviations: Step-to pattern;Decreased stride length;Decreased weight shift to left;Trunk flexed Gait velocity: decreased   General Gait Details: Cues for RW proximity and posture; pt reports feels akward as L leg was much shorter prior and now even - wife reports much more even gait pattern now  Pt with heavy breathing but reports somewhat his normal and O2 sats 99% Stairs            Wheelchair Mobility    Modified Rankin (Stroke Patients Only)       Balance Overall balance assessment: Needs assistance Sitting-balance support: No upper extremity supported Sitting balance-Leahy Scale: Good Sitting balance - Comments: Sitting EOB without difficulty     Standing balance-Leahy Scale: Poor Standing balance comment: required RW but steady with RW                             Pertinent Vitals/Pain Pain Assessment: 0-10 Pain Score: 5  (13/10 supine, down to 5/10 with sitting) Pain Location: L hip Pain Descriptors / Indicators: Discomfort;Sore Pain Intervention(s): Limited activity within patient's tolerance;Monitored during session;Repositioned;Premedicated before session    Home Living Family/patient expects to be discharged to:: Private residence Living Arrangements: Spouse/significant other;Children Available Help at Discharge: Family;Available 24 hours/day Type of Home: House Home Access: Stairs to enter Entrance Stairs-Rails: Right;Left;Can reach both Entrance Stairs-Number of Steps: 3 Home Layout: One level Home Equipment: Walker - 2 wheels;Cane - single point Additional Comments: Has purchased toilet riser but  it has not arrived yet    Prior Function Level of Independence: Independent with assistive device(s)         Comments: Reports independent with IADLs, ADLS, and community ambulation.  Recently has used cane and had some assist with socks/shoes.     Hand Dominance        Extremity/Trunk Assessment   Upper  Extremity Assessment Upper Extremity Assessment: Overall WFL for tasks assessed    Lower Extremity Assessment Lower Extremity Assessment: LLE deficits/detail;RLE deficits/detail RLE Deficits / Details: ROM WFL; MMT 5/5 LLE Deficits / Details: Expected post op changes.  ROM: some limitations in L hip but overall functional; MMT: ankle 5/5, knee 3/5, hip 1/5 limited by pain    Cervical / Trunk Assessment Cervical / Trunk Assessment: Normal  Communication   Communication: HOH  Cognition Arousal/Alertness: Awake/alert Behavior During Therapy: WFL for tasks assessed/performed Overall Cognitive Status: Within Functional Limits for tasks assessed                                        General Comments General comments (skin integrity, edema, etc.): Left sitting EOB steady with family present.  Pt much more comfortable in sitting and recliner is too low.   Pt with tremors at baseline.    Exercises     Assessment/Plan    PT Assessment Patient needs continued PT services  PT Problem List Decreased strength;Decreased mobility;Decreased activity tolerance;Decreased balance;Decreased knowledge of use of DME;Pain       PT Treatment Interventions DME instruction;Therapeutic activities;Modalities;Gait training;Therapeutic exercise;Patient/family education;Stair training;Balance training;Functional mobility training    PT Goals (Current goals can be found in the Care Plan section)  Acute Rehab PT Goals Patient Stated Goal: return home, walk normal PT Goal Formulation: With patient/family Time For Goal Achievement: 01/29/21 Potential to Achieve Goals: Good    Frequency 7X/week   Barriers to discharge        Co-evaluation               AM-PAC PT "6 Clicks" Mobility  Outcome Measure Help needed turning from your back to your side while in a flat bed without using bedrails?: A Little Help needed moving from lying on your back to sitting on the side of a flat  bed without using bedrails?: A Little Help needed moving to and from a bed to a chair (including a wheelchair)?: A Little Help needed standing up from a chair using your arms (e.g., wheelchair or bedside chair)?: A Little Help needed to walk in hospital room?: A Little Help needed climbing 3-5 steps with a railing? : A Little 6 Click Score: 18    End of Session Equipment Utilized During Treatment: Gait belt Activity Tolerance: Patient tolerated treatment well Patient left: in bed;with call bell/phone within reach;with family/visitor present (sitting EOB) Nurse Communication: Mobility status (sitting EOB) PT Visit Diagnosis: Other abnormalities of gait and mobility (R26.89);Pain;Muscle weakness (generalized) (M62.81) Pain - Right/Left: Left Pain - part of body: Hip    Time: 7371-0626 PT Time Calculation (min) (ACUTE ONLY): 28 min   Charges:   PT Evaluation $PT Eval Low Complexity: 1 Low PT Treatments $Gait Training: 8-22 mins        Anise Salvo, PT Acute Rehab Services Pager [phone] [facility] Rehab [phone]    Rayetta Humphrey 01/15/2021, 5:28 PM

## 2021-01-16 DIAGNOSIS — M1612 Unilateral primary osteoarthritis, left hip: Secondary | ICD-10-CM | POA: Diagnosis not present

## 2021-01-16 LAB — BASIC METABOLIC PANEL
Anion gap: 8 (ref 5–15)
BUN: 13 mg/dL (ref 8–23)
CO2: 24 mmol/L (ref 22–32)
Calcium: 8.4 mg/dL — ABNORMAL LOW (ref 8.9–10.3)
Chloride: 103 mmol/L (ref 98–111)
Creatinine, Ser: 0.92 mg/dL (ref 0.61–1.24)
GFR, Estimated: >60 mL/min (ref >60)
Glucose, Bld: 118 mg/dL — ABNORMAL HIGH (ref 70–99)
Potassium: 3.4 mmol/L — ABNORMAL LOW (ref 3.5–5.1)
Sodium: 135 mmol/L (ref 135–145)

## 2021-01-16 LAB — CBC
HCT: 34.7 % — ABNORMAL LOW (ref 39.0–52.0)
Hemoglobin: 12.1 g/dL — ABNORMAL LOW (ref 13.0–17.0)
MCH: 31.6 pg (ref 26.0–34.0)
MCHC: 34.9 g/dL (ref 30.0–36.0)
MCV: 90.6 fL (ref 80.0–100.0)
Platelets: 174 10*3/uL (ref 150–400)
RBC: 3.83 MIL/uL — ABNORMAL LOW (ref 4.22–5.81)
RDW: 12.1 % (ref 11.5–15.5)
WBC: 12.5 10*3/uL — ABNORMAL HIGH (ref 4.0–10.5)
nRBC: 0 % (ref 0.0–0.2)

## 2021-01-16 MED ORDER — ASPIRIN EC 325 MG PO TBEC: 325.0000 mg | DELAYED_RELEASE_TABLET | Freq: Every day | ORAL | 0 refills | Status: DC

## 2021-01-16 MED ORDER — METHOCARBAMOL 500 MG PO TABS: 500.0000 mg | ORAL_TABLET | Freq: Four times a day (QID) | ORAL | 0 refills | Status: DC | PRN

## 2021-01-16 MED ORDER — OXYCODONE-ACETAMINOPHEN 5-325 MG PO TABS: 1.0000 | ORAL_TABLET | ORAL | 0 refills | Status: DC | PRN

## 2021-01-16 NOTE — Progress Notes (Signed)
  Subjective: Jose Joseph is a 65 y.o. male s/p left THA.  They are POD 1.  Pt's pain is controlled overall.  Pt has ambulated with some difficulty.  Denies any dizziness aside from baseline vertigo when he is ambulatory.  Denies any chest pain, shortness of breath, calf pain.  No trouble breathing. Did well with physical therapy earlier this morning.  Objective: Vital signs in last 24 hours: Temp:  [97 F (36.1 C)-99.6 F (37.6 C)] 99.5 F (37.5 C) (06/04 0732) Pulse Rate:  [56-105] 105 (06/04 0732) Resp:  [8-20] 18 (06/04 0732) BP: (102-150)/(58-93) 150/76 (06/04 0732) SpO2:  [94 %-100 %] 100 % (06/04 0732)  Intake/Output from previous day: 06/03 0701 - 06/04 0700 In: 1500 [I.V.:1300; IV Piggyback:200] Out: 900 [Urine:450; Blood:450] Intake/Output this shift: No intake/output data recorded.  Exam:  No gross blood or drainage overlying the dressing Left foot warm and well-perfused Sensation intact distally in the left foot Able to dorsiflex and plantarflex the left foot No calf tenderness.  Negative Homans' sign.   Labs: Recent Labs    01/16/21 0623  HGB 12.1*   Recent Labs    01/16/21 0623  WBC 12.5*  RBC 3.83*  HCT 34.7*  PLT 174   Recent Labs    01/16/21 0623  NA 135  K 3.4*  CL 103  CO2 24  BUN 13  CREATININE 0.92  GLUCOSE 118*  CALCIUM 8.4*   No results for input(s): LABPT, INR in the last 72 hours.  Assessment/Plan: Pt is POD 1 s/p left THA.    -Plan to discharge to home today   -WBAT with a walker  -Okay to shower, dressing is waterproof.  Cautioned patient against soaking dressing in bath/pool/body of water  -Follow-up with Dr. Ophelia Charter about 2 weeks postop.  Discussed the red flag symptoms to look out for following total hip replacement.  Encouraged him to call the office on-call number over the weekend or throughout the week if he has any concerns that he would like to discuss.     Kaleigh Spiegelman L Brylei Pedley 01/16/2021, 10:23 AM

## 2021-01-16 NOTE — Progress Notes (Signed)
Physical Therapy Treatment Patient Details Name: Jose Joseph MRN: [id number] DOB: [birth year] Today's Date: 01/16/2021    History of Present Illness Pt is 65 yo male s/p L anterior THA on 01/15/21.  Pt with pmh including OA, dyspnea, and neck surgery.    PT Comments    Pt is making good progress towards his goals today.  Pt sitting EoB with son in room on entry. Pt is limited in safe mobility by L LE pain and weakness. Pt currently min guard for transfers and ambulation with RW. After ambulation, pt provided with HEP and practiced sitting and standing exercises. Pt with no further questions and looking forward to d/c home this morning.    Follow Up Recommendations  Follow surgeon's recommendation for DC plan and follow-up therapies;Supervision for mobility/OOB     Equipment Recommendations  None recommended by PT       Precautions / Restrictions Precautions Precautions: Fall Restrictions Weight Bearing Restrictions: Yes LLE Weight Bearing: Weight bearing as tolerated    Mobility  Bed Mobility               General bed mobility comments: sitting EoB on entry    Transfers Overall transfer level: Needs assistance Equipment used: Rolling walker (2 wheeled) Transfers: Sit to/from Stand Sit to Stand: From elevated surface;Min guard         General transfer comment: min guard for safety with power up and steadying in RW  Ambulation/Gait Ambulation/Gait assistance: Min guard Gait Distance (Feet): 100 Feet Assistive device: Rolling walker (2 wheeled) Gait Pattern/deviations: Step-to pattern;Decreased stride length;Decreased weight shift to left;Trunk flexed Gait velocity: decreased Gait velocity interpretation: <1.31 ft/sec, indicative of household ambulator General Gait Details: min guard for safety, vc for upright posture and proximity to RW         Balance Overall balance assessment: Needs assistance Sitting-balance support: No upper extremity  supported Sitting balance-Leahy Scale: Good Sitting balance - Comments: Sitting EOB without difficulty     Standing balance-Leahy Scale: Poor Standing balance comment: required RW but steady with RW                            Cognition Arousal/Alertness: Awake/alert Behavior During Therapy: WFL for tasks assessed/performed Overall Cognitive Status: Within Functional Limits for tasks assessed                                        Exercises Total Joint Exercises Long Arc Quad: AROM;Both;10 reps;Seated Knee Flexion: AROM;Both;5 reps;Standing Marching in Standing: AROM;Both;10 reps;Seated Standing Hip Extension: AROM;Both;10 reps;Standing    General Comments General comments (skin integrity, edema, etc.): Left sitting EOB with son in room. prefers to lying down or sitting in recliner      Pertinent Vitals/Pain Pain Assessment: 0-10 Pain Score: 5  Pain Location: L hip Pain Descriptors / Indicators: Discomfort;Sore Pain Intervention(s): Limited activity within patient's tolerance;Monitored during session;Repositioned           PT Goals (current goals can now be found in the care plan section) Acute Rehab PT Goals Patient Stated Goal: return home, walk normal PT Goal Formulation: With patient/family Time For Goal Achievement: 01/29/21 Potential to Achieve Goals: Good Progress towards PT goals: Progressing toward goals    Frequency    7X/week      PT Plan Current plan remains appropriate  AM-PAC PT "6 Clicks" Mobility   Outcome Measure  Help needed turning from your back to your side while in a flat bed without using bedrails?: A Little Help needed moving from lying on your back to sitting on the side of a flat bed without using bedrails?: A Little Help needed moving to and from a bed to a chair (including a wheelchair)?: A Little Help needed standing up from a chair using your arms (e.g., wheelchair or bedside chair)?: A  Little Help needed to walk in hospital room?: A Little Help needed climbing 3-5 steps with a railing? : A Little 6 Click Score: 18    End of Session Equipment Utilized During Treatment: Gait belt Activity Tolerance: Patient tolerated treatment well Patient left: in bed;with call bell/phone within reach;with family/visitor present (sitting EOB) Nurse Communication: Mobility status (sitting EOB) PT Visit Diagnosis: Other abnormalities of gait and mobility (R26.89);Pain;Muscle weakness (generalized) (M62.81) Pain - Right/Left: Left Pain - part of body: Hip     Time: 2637-8588 PT Time Calculation (min) (ACUTE ONLY): 27 min  Charges:  $Gait Training: 8-22 mins $Therapeutic Exercise: 8-22 mins                     Rubina Basinski B. Beverely Risen PT, DPT Acute Rehabilitation Services Pager [phone] Office [phone]    Elon Alas Fleet 01/16/2021, 10:19 AM

## 2021-01-16 NOTE — Progress Notes (Signed)
Patient is discharged from room 3C11 at this time. Alert and in stable condition. IV site d/c'd and instructions read to patient and son with understanding verbalized and all questions answered. Left unit via wheelchair with all belongings at side.

## 2021-01-18 ENCOUNTER — Encounter (HOSPITAL_COMMUNITY): Payer: Self-pay | Admitting: Orthopaedic Surgery

## 2021-01-20 ENCOUNTER — Telehealth: Payer: Self-pay | Admitting: *Deleted

## 2021-01-20 NOTE — Telephone Encounter (Signed)
Ortho bundle call to patient. States he is taking one 81mg  Aspirin daily (he insists he was told this), but I see on discharge summary, he should be taking 1-325mg  aspirin daily. Wanted to confirm with you. Thanks.

## 2021-01-20 NOTE — Telephone Encounter (Signed)
Updated patient on response from Dr. Ophelia Charter.

## 2021-01-20 NOTE — Telephone Encounter (Signed)
Either is fine. Literature supports both. Just leave as is.

## 2021-01-22 ENCOUNTER — Telehealth: Payer: Self-pay | Admitting: *Deleted

## 2021-01-22 ENCOUNTER — Other Ambulatory Visit: Payer: Self-pay | Admitting: Surgery

## 2021-01-22 MED ORDER — OXYCODONE-ACETAMINOPHEN 5-325 MG PO TABS: 1.0000 | ORAL_TABLET | Freq: Four times a day (QID) | ORAL | 0 refills | Status: DC | PRN

## 2021-01-22 NOTE — Telephone Encounter (Signed)
I called patient and advised. 

## 2021-01-22 NOTE — Telephone Encounter (Signed)
Please advise 

## 2021-01-22 NOTE — Telephone Encounter (Signed)
Ortho bundle 7 day call. Patient requesting refill of pain medication. Using sparingly, but would possibly run out over the weekend and would like before then. Thanks.

## 2021-01-28 ENCOUNTER — Ambulatory Visit (INDEPENDENT_AMBULATORY_CARE_PROVIDER_SITE_OTHER): Payer: Medicare Other | Admitting: Orthopaedic Surgery

## 2021-01-28 ENCOUNTER — Ambulatory Visit: Payer: Self-pay

## 2021-01-28 ENCOUNTER — Other Ambulatory Visit: Payer: Self-pay

## 2021-01-28 ENCOUNTER — Encounter: Payer: Self-pay | Admitting: Orthopaedic Surgery

## 2021-01-28 VITALS — Ht 74.0 in | Wt 261.0 lb

## 2021-01-28 DIAGNOSIS — Z96642 Presence of left artificial hip joint: Secondary | ICD-10-CM

## 2021-01-28 NOTE — Progress Notes (Signed)
   Post-Op Visit Note   Patient: Jose Joseph           Date of Birth: [birth year]           MRN: [id number] Visit Date: 01/28/2021 PCP: Carylon Perches, MD   Assessment & Plan: Post left total hip arthroplasty.  Staples are harvested.  He had about almost an inch shortening before surgery has some heel lifts he can place inside his right shoe that will even him out.  I will recheck him again in 5 weeks.  Chief Complaint:  Chief Complaint  Patient presents with   Left Hip - Routine Post Op    01/15/2021 Left THA   Visit Diagnoses:  1. S/P total left hip arthroplasty     Plan: ROV 5 wks  Follow-Up Instructions: No follow-ups on file.   Orders:  Orders Placed This Encounter  Procedures   XR HIP UNILAT W OR W/O PELVIS 2-3 VIEWS LEFT   No orders of the defined types were placed in this encounter.   Imaging: No results found.  PMFS History: Patient Active Problem List   Diagnosis Date Noted   Status post total replacement of left hip 01/15/2021   Unilateral primary osteoarthritis, left hip 12/03/2020   Elevated PSA 04/10/2020   Pain in left hip 02/26/2020   Pain in right shoulder 02/26/2020   Past Medical History:  Diagnosis Date   Arthritis    Depression    Dyspnea    when he was smoking   History of kidney stones    2021   Pneumonia    during COVID in 2021    Family History  Problem Relation Age of Onset   Kidney cancer Sister    Prostate cancer Brother     Past Surgical History:  Procedure Laterality Date   KNEE ARTHROCENTESIS     NECK SURGERY     TOTAL HIP ARTHROPLASTY Left 01/15/2021   Procedure: LEFT TOTAL HIP ARTHROPLASTY ANTERIOR APPROACH;  Surgeon: Eldred Manges, MD;  Location: [facility] OR;  Service: Orthopedics;  Laterality: Left;   WRIST ARTHROPLASTY     Social History   Occupational History   Occupation: retired  Tobacco Use   Smoking status: Former    Packs/day: 1.00    Years: 35.00    Pack years: 35.00    Types: Cigarettes    Quit date: 2014     Years since quitting: 8.4   Smokeless tobacco: Never  Substance and Sexual Activity   Alcohol use: Yes    Alcohol/week: 2.0 standard drinks    Types: 2 Cans of beer per week   Drug use: Yes   Sexual activity: Not on file

## 2021-01-29 ENCOUNTER — Telehealth: Payer: Self-pay | Admitting: *Deleted

## 2021-01-29 NOTE — Telephone Encounter (Signed)
Ortho bundle 14 day call to patient. 

## 2021-02-01 NOTE — Discharge Summary (Signed)
Patient ID: Jose Joseph MRN: [id number] DOB/AGE: 67-Sep-1957 67 y.o.  Admit date: 01/15/2021 Discharge date: 01/16/2021  Admission Diagnoses:  Active Problems:   Status post total replacement of left hip   Discharge Diagnoses:  Active Problems:   Status post total replacement of left hip  status post Procedure(s): LEFT TOTAL HIP ARTHROPLASTY ANTERIOR APPROACH  Past Medical History:  Diagnosis Date   Arthritis    Depression    Dyspnea    when he was smoking   History of kidney stones    2021   Pneumonia    during COVID in 2021    Surgeries: Procedure(s): LEFT TOTAL HIP ARTHROPLASTY ANTERIOR APPROACH on 01/15/2021   Consultants:   Discharged Condition: Improved  Hospital Course: Jose Joseph is an 65 y.o. male who was admitted 01/15/2021 for operative treatment of left hi djd. Patient failed conservative treatments (please see the history and physical for the specifics) and had severe unremitting pain that affects sleep, daily activities and work/hobbies. After pre-op clearance, the patient was taken to the operating room on 01/15/2021 and underwent  Procedure(s): LEFT TOTAL HIP ARTHROPLASTY ANTERIOR APPROACH.    Patient was given perioperative antibiotics:  Anti-infectives (From admission, onward)    Start     Dose/Rate Route Frequency Ordered Stop   01/15/21 0830  ceFAZolin (ANCEF) IVPB 2g/100 mL premix        2 g 200 mL/hr over 30 Minutes Intravenous On call to O.R. 01/15/21 0820 01/15/21 1050   01/15/21 0827  ceFAZolin (ANCEF) 2-4 GM/100ML-% IVPB       Note to Pharmacy: Gleason, Ginger   : cabinet override      01/15/21 0827 01/15/21 1053        Patient was given sequential compression devices and early ambulation to prevent DVT.   Patient benefited maximally from hospital stay and there were no complications. At the time of discharge, the patient was urinating/moving their bowels without difficulty, tolerating a regular diet, pain is controlled with oral pain  medications and they have been cleared by PT/OT.   Recent vital signs: No data found.   Recent laboratory studies: No results for input(s): WBC, HGB, HCT, PLT, NA, K, CL, CO2, BUN, CREATININE, GLUCOSE, INR, CALCIUM in the last 72 hours.  Invalid input(s): PT, 2   Discharge Medications:   Allergies as of 01/16/2021       Reactions   Bee Venom Anaphylaxis   syncope        Medication List     STOP taking these medications    ibuprofen 200 MG tablet Commonly known as: ADVIL   traMADol 50 MG tablet Commonly known as: ULTRAM       TAKE these medications    aspirin EC 325 MG tablet Take 1 tablet (325 mg total) by mouth daily. MUST TAKE AT LEAST 4 WEEKS POSTOP FOR DVT PROPHYLAXIS   cetirizine 10 MG tablet Commonly known as: ZYRTEC Take 10 mg by mouth in the morning.   citalopram 20 MG tablet Commonly known as: CELEXA Take 20 mg by mouth in the morning.   EPINEPHrine 0.3 mg/0.3 mL Soaj injection Commonly known as: EPI-PEN Inject 0.3 mg into the muscle as needed for anaphylaxis.   fexofenadine 180 MG tablet Commonly known as: ALLEGRA Take 180 mg by mouth in the morning.   methocarbamol 500 MG tablet Commonly known as: Robaxin Take 1 tablet (500 mg total) by mouth every 6 (six) hours as needed for muscle spasms.   oxyCODONE-acetaminophen  5-325 MG tablet Commonly known as: PERCOCET/ROXICET Take 1 tablet by mouth every 4 (four) hours as needed for severe pain.   SAW PALMETTO PO Take 1 capsule by mouth in the morning.        Diagnostic Studies: DG Chest 2 View  Result Date: 01/12/2021 CLINICAL DATA:  Preop evaluation for upcoming hip replacement, initial encounter EXAM: CHEST - 2 VIEW COMPARISON:  None. FINDINGS: The heart size and mediastinal contours are within normal limits. Both lungs are clear. The visualized skeletal structures are unremarkable. IMPRESSION: No active cardiopulmonary disease. Electronically Signed   By: Alcide CleverMark  Lukens M.D.   On: 01/12/2021  21:34   DG C-Arm 1-60 Min  Result Date: 01/15/2021 CLINICAL DATA:  Left total hip arthroplasty. EXAM: DG C-ARM 1-60 MIN; OPERATIVE LEFT HIP WITH PELVIS FLUOROSCOPY TIME:  Fluoroscopy Time:  28 seconds. Radiation Exposure Index (if provided by the fluoroscopic device): 3.52 mGy. Number of Acquired Spot Images: 5 COMPARISON:  Dec 14, 2009. FINDINGS: Five C-arm fluoroscopic images were obtained intraoperatively and submitted for post operative interpretation. These images demonstrate surgical changes of left total hip arthroplasty. No unexpected findings. No unexpected foreign bodies. Please see the performing provider's procedural report for further detail. IMPRESSION: Intraoperative fluoroscopy, as detailed above. Electronically Signed   By: Feliberto HartsFrederick S Kuri MD   On: 01/15/2021 12:47   DG Hip Port Unilat With Pelvis 1V Left  Result Date: 01/15/2021 CLINICAL DATA:  Status post total hip replacement EXAM: DG HIP (WITH OR WITHOUT PELVIS) 1V PORT LEFT COMPARISON:  None. FINDINGS: Frontal view shows most of the pelvis as well as bilateral hip obtained. Patient is status post total hip replacement on the left with prosthetic components well-seated on frontal view. No acute fracture or dislocation. There is mild narrowing of the right hip. Soft tissue air overlying skin staples noted on the left. IMPRESSION: Status post total hip replacement on the left with prosthetic components well-seated on frontal view. No acute fracture or dislocation. Mild narrowing left hip joint. Acute postoperative changes noted on the left. Electronically Signed   By: Bretta BangWilliam  Woodruff III M.D.   On: 01/15/2021 14:21   DG HIP OPERATIVE UNILAT WITH PELVIS LEFT  Result Date: 01/15/2021 CLINICAL DATA:  Left total hip arthroplasty. EXAM: DG C-ARM 1-60 MIN; OPERATIVE LEFT HIP WITH PELVIS FLUOROSCOPY TIME:  Fluoroscopy Time:  28 seconds. Radiation Exposure Index (if provided by the fluoroscopic device): 3.52 mGy. Number of Acquired Spot  Images: 5 COMPARISON:  Dec 14, 2009. FINDINGS: Five C-arm fluoroscopic images were obtained intraoperatively and submitted for post operative interpretation. These images demonstrate surgical changes of left total hip arthroplasty. No unexpected findings. No unexpected foreign bodies. Please see the performing provider's procedural report for further detail. IMPRESSION: Intraoperative fluoroscopy, as detailed above. Electronically Signed   By: Feliberto HartsFrederick S Kratochvil MD   On: 01/15/2021 12:47   XR HIP UNILAT W OR W/O PELVIS 2-3 VIEWS LEFT  Result Date: 01/28/2021 Standing AP pelvis AP frog-leg left hip obtained and reviewed.  Good position of the stem and acetabulum.  He measures 3 to 4 mm long on the operative side. Impression: Postop left total hip arthroplasty satisfactory position and alignment.   Discharge Instructions     Call MD / Call 911   Complete by: As directed    If you experience chest pain or shortness of breath, CALL 911 and be transported to the hospital emergency room.  If you develope a fever above 101 F, pus (white drainage) or increased drainage  or redness at the wound, or calf pain, call your surgeon's office.   Constipation Prevention   Complete by: As directed    Drink plenty of fluids.  Prune juice may be helpful.  You may use a stool softener, such as Colace (over the counter) 100 mg twice a day.  Use MiraLax (over the counter) for constipation as needed.   Diet - low sodium heart healthy   Complete by: As directed    Discharge instructions   Complete by: As directed    You may shower, dressing is waterproof.  No submersion underwater.  Do not remove the dressing, we will remove it at your first post-op appointment.  Do not take a bath or soak the knee in a tub or pool.  You may weightbear as you can tolerate on the operative leg with a walker. You will follow-up with Dr. Ophelia Charter in the clinic in 2 weeks at your given appointment date on 01/28/21 at 1:15 PM.    INSTRUCTIONS AFTER  JOINT REPLACEMENT   Remove items at home which could result in a fall. This includes throw rugs or furniture in walking pathways ICE to the affected joint every three hours while awake for 30 minutes at a time, for at least the first 3-5 days, and then as needed for pain and swelling.  Continue to use ice for pain and swelling. You may notice swelling that will progress down to the foot and ankle.  This is normal after surgery.  Elevate your leg when you are not up walking on it.   Continue to use the breathing machine you got in the hospital (incentive spirometer) which will help keep your temperature down.  It is common for your temperature to cycle up and down following surgery, especially at night when you are not up moving around and exerting yourself.  The breathing machine keeps your lungs expanded and your temperature down.   DIET:  As you were doing prior to hospitalization, we recommend a well-balanced diet.  DRESSING / WOUND CARE / SHOWERING  Keep the surgical dressing until follow up.  The dressing is water proof, so you can shower without any extra covering.  IF THE DRESSING FALLS OFF or the wound gets wet inside, change the dressing with sterile gauze.  Please use good hand washing techniques before changing the dressing.  Do not use any lotions or creams on the incision until instructed by your surgeon.    ACTIVITY  Increase activity slowly as tolerated, but follow the weight bearing instructions below.   No driving for 6 weeks or until further direction given by your physician.  You cannot drive while taking narcotics.  No lifting or carrying greater than 10 lbs. until further directed by your surgeon. Avoid periods of inactivity such as sitting longer than an hour when not asleep. This helps prevent blood clots.  You may return to work once you are authorized by your doctor.     WEIGHT BEARING   Weight bearing as tolerated with assist device (walker, cane, etc) as directed,  use it as long as suggested by your surgeon or therapist, typically at least 4-6 weeks.   EXERCISES  Results after joint replacement surgery are often greatly improved when you follow the exercise, range of motion and muscle strengthening exercises prescribed by your doctor. Safety measures are also important to protect the joint from further injury. Any time any of these exercises cause you to have increased pain or swelling, decrease what you  are doing until you are comfortable again and then slowly increase them. If you have problems or questions, call your caregiver or physical therapist for advice.   Rehabilitation is important following a joint replacement. After just a few days of immobilization, the muscles of the leg can become weakened and shrink (atrophy).  These exercises are designed to build up the tone and strength of the thigh and leg muscles and to improve motion. Often times heat used for twenty to thirty minutes before working out will loosen up your tissues and help with improving the range of motion but do not use heat for the first two weeks following surgery (sometimes heat can increase post-operative swelling).   These exercises can be done on a training (exercise) mat, on the floor, on a table or on a bed. Use whatever works the best and is most comfortable for you.    Use music or television while you are exercising so that the exercises are a pleasant break in your day. This will make your life better with the exercises acting as a break in your routine that you can look forward to.   Perform all exercises about fifteen times, three times per day or as directed.  You should exercise both the operative leg and the other leg as well.  Exercises include:   Quad Sets - Tighten up the muscle on the front of the thigh (Quad) and hold for 5-10 seconds.   Straight Leg Raises - With your knee straight (if you were given a brace, keep it on), lift the leg to 60 degrees, hold for 3  seconds, and slowly lower the leg.  Perform this exercise against resistance later as your leg gets stronger.  Leg Slides: Lying on your back, slowly slide your foot toward your buttocks, bending your knee up off the floor (only go as far as is comfortable). Then slowly slide your foot back down until your leg is flat on the floor again.  Angel Wings: Lying on your back spread your legs to the side as far apart as you can without causing discomfort.  Hamstring Strength:  Lying on your back, push your heel against the floor with your leg straight by tightening up the muscles of your buttocks.  Repeat, but this time bend your knee to a comfortable angle, and push your heel against the floor.  You may put a pillow under the heel to make it more comfortable if necessary.   A rehabilitation program following joint replacement surgery can speed recovery and prevent re-injury in the future due to weakened muscles. Contact your doctor or a physical therapist for more information on knee rehabilitation.    CONSTIPATION  Constipation is defined medically as fewer than three stools per week and severe constipation as less than one stool per week.  Even if you have a regular bowel pattern at home, your normal regimen is likely to be disrupted due to multiple reasons following surgery.  Combination of anesthesia, postoperative narcotics, change in appetite and fluid intake all can affect your bowels.   YOU MUST use at least one of the following options; they are listed in order of increasing strength to get the job done.  They are all available over the counter, and you may need to use some, POSSIBLY even all of these options:    Drink plenty of fluids (prune juice may be helpful) and high fiber foods Colace 100 mg by mouth twice a day  Senokot for constipation as  directed and as needed Dulcolax (bisacodyl), take with full glass of water  Miralax (polyethylene glycol) once or twice a day as needed.  If you  have tried all these things and are unable to have a bowel movement in the first 3-4 days after surgery call either your surgeon or your primary doctor.    If you experience loose stools or diarrhea, hold the medications until you stool forms back up.  If your symptoms do not get better within 1 week or if they get worse, check with your doctor.  If you experience "the worst abdominal pain ever" or develop nausea or vomiting, please contact the office immediately for further recommendations for treatment.   ITCHING:  If you experience itching with your medications, try taking only a single pain pill, or even half a pain pill at a time.  You can also use Benadryl over the counter for itching or also to help with sleep.   TED HOSE STOCKINGS:  Use stockings on both legs until for at least 2 weeks or as directed by physician office. They may be removed at night for sleeping.  MEDICATIONS:  See your medication summary on the "After Visit Summary" that nursing will review with you.  You may have some home medications which will be placed on hold until you complete the course of blood thinner medication.  It is important for you to complete the blood thinner medication as prescribed.  PRECAUTIONS:  If you experience chest pain or shortness of breath - call 911 immediately for transfer to the hospital emergency department.   If you develop a fever greater that 101 F, purulent drainage from wound, increased redness or drainage from wound, foul odor from the wound/dressing, or calf pain - CONTACT YOUR SURGEON.                                                   FOLLOW-UP APPOINTMENTS:  If you do not already have a post-op appointment, please call the office for an appointment to be seen by your surgeon.  Guidelines for how soon to be seen are listed in your "After Visit Summary", but are typically between 1-4 weeks after surgery.   POST-OPERATIVE OPIOID TAPER INSTRUCTIONS: It is important to wean off of your  opioid medication as soon as possible. If you do not need pain medication after your surgery it is ok to stop day one. Opioids include: Codeine, Hydrocodone(Norco, Vicodin), Oxycodone(Percocet, oxycontin) and hydromorphone amongst others.  Long term and even short term use of opiods can cause: Increased pain response Dependence Constipation Depression Respiratory depression And more.  Withdrawal symptoms can include Flu like symptoms Nausea, vomiting And more Techniques to manage these symptoms Hydrate well Eat regular healthy meals Stay active Use relaxation techniques(deep breathing, meditating, yoga) Do Not substitute Alcohol to help with tapering If you have been on opioids for less than two weeks and do not have pain than it is ok to stop all together.  Plan to wean off of opioids This plan should start within one week post op of your joint replacement. Maintain the same interval or time between taking each dose and first decrease the dose.  Cut the total daily intake of opioids by one tablet each day Next start to increase the time between doses. The last dose that should be eliminated is the  evening dose.   MAKE SURE YOU:  Understand these instructions.  Get help right away if you are not doing well or get worse.    Thank you for letting us be a part of your medical care team.  It is a privilege we respect greatly.  We hope these instructions will help you stay on track for a fast and full recovery!    Dental Antibiotics:  In most cases prophylactic antibiotics for Dental procdeures after total joint surgery are not necessary.  Exceptions are as follows:  1. History of prior total joint infection  2. Severely immunocompromised (Organ Transplant, cancer chemotherapy, Rheumatoid biologic meds such as Humera)  3. Poorly controlled diabetes (A1C &gt; 8.0, blood glucose over 200)  If you have one of these conditions, contact your surgeon for an antibiotic  prescription, prior to your dental procedure.   Increase activity slowly as tolerated   Complete by: As directed    Post-operative opioid taper instructions:   Complete by: As directed    POST-OPERATIVE OPIOID TAPER INSTRUCTIONS: It is important to wean off of your opioid medication as soon as possible. If you do not need pain medication after your surgery it is ok to stop day one. Opioids include: Codeine, Hydrocodone(Norco, Vicodin), Oxycodone(Percocet, oxycontin) and hydromorphone amongst others.  Long term and even short term use of opiods can cause: Increased pain response Dependence Constipation Depression Respiratory depression And more.  Withdrawal symptoms can include Flu like symptoms Nausea, vomiting And more Techniques to manage these symptoms Hydrate well Eat regular healthy meals Stay active Use relaxation techniques(deep breathing, meditating, yoga) Do Not substitute Alcohol to help with tapering If you have been on opioids for less than two weeks and do not have pain than it is ok to stop all together.  Plan to wean off of opioids This plan should start within one week post op of your joint replacement. Maintain the same interval or time between taking each dose and first decrease the dose.  Cut the total daily intake of opioids by one tablet each day Next start to increase the time between doses. The last dose that should be eliminated is the evening dose.           Follow-up Information     Eldred Manges, MD. Go on 01/28/2021.   Specialty: Orthopedic Surgery Why: Your first post op appointment is scheduled with Dr. Ophelia Charter in the [place] office at 1:15 pm. Contact information: [address] [place] [address] [phone]                 Discharge Plan:  discharge to home  Disposition:     Signed: Zonia Kief  02/01/2021, 4:49 PM

## 2021-02-12 ENCOUNTER — Telehealth: Payer: Self-pay | Admitting: *Deleted

## 2021-02-12 NOTE — Telephone Encounter (Signed)
Ortho bundle 30 day call to patient. Survey completed. 

## 2021-03-11 ENCOUNTER — Other Ambulatory Visit: Payer: Self-pay

## 2021-03-11 ENCOUNTER — Ambulatory Visit (INDEPENDENT_AMBULATORY_CARE_PROVIDER_SITE_OTHER): Payer: Medicare Other | Admitting: Orthopaedic Surgery

## 2021-03-11 ENCOUNTER — Encounter: Payer: Self-pay | Admitting: Orthopaedic Surgery

## 2021-03-11 VITALS — Ht 75.0 in | Wt 255.0 lb

## 2021-03-11 DIAGNOSIS — Z96642 Presence of left artificial hip joint: Secondary | ICD-10-CM

## 2021-03-11 NOTE — Progress Notes (Signed)
   Post-Op Visit Note   Patient: Jose Joseph           Date of Birth: 12/25/1955           MRN: [id number] Visit Date: 03/11/2021 PCP: Carylon Perches, MD   Assessment & Plan: Postop left total hip arthroplasty good relief preop pain.  He has heel lift inside his right shoe.  States his back gets stiff but does not have any claudication symptoms currently.  He is happy with the results of the left total hip arthroplasty.  Chief Complaint:  Chief Complaint  Patient presents with   Left Hip - Follow-up    01/15/2021 Left THA   Visit Diagnoses:  1. Status post total replacement of left hip     Plan: Post left total hip arthroplasty.  He is happy the surgical result.  He is using occasional tramadol at night.  Follow-up as needed.  Follow-Up Instructions: No follow-ups on file.   Orders:  No orders of the defined types were placed in this encounter.  No orders of the defined types were placed in this encounter.   Imaging: No results found.  PMFS History: Patient Active Problem List   Diagnosis Date Noted   Status post total replacement of left hip 01/15/2021   Elevated PSA 04/10/2020   Pain in left hip 02/26/2020   Pain in right shoulder 02/26/2020   Past Medical History:  Diagnosis Date   Arthritis    Depression    Dyspnea    when he was smoking   History of kidney stones    2021   Pneumonia    during COVID in 2021    Family History  Problem Relation Age of Onset   Kidney cancer Sister    Prostate cancer Brother     Past Surgical History:  Procedure Laterality Date   KNEE ARTHROCENTESIS     NECK SURGERY     TOTAL HIP ARTHROPLASTY Left 01/15/2021   Procedure: LEFT TOTAL HIP ARTHROPLASTY ANTERIOR APPROACH;  Surgeon: Eldred Manges, MD;  Location: [facility] OR;  Service: Orthopedics;  Laterality: Left;   WRIST ARTHROPLASTY     Social History   Occupational History   Occupation: retired  Tobacco Use   Smoking status: Former    Packs/day: 1.00    Years: 35.00    Pack  years: 35.00    Types: Cigarettes    Quit date: 2014    Years since quitting: 8.5   Smokeless tobacco: Never  Substance and Sexual Activity   Alcohol use: Yes    Alcohol/week: 2.0 standard drinks    Types: 2 Cans of beer per week   Drug use: Yes   Sexual activity: Not on file

## 2021-05-06 ENCOUNTER — Encounter: Payer: Self-pay | Admitting: Surgery

## 2021-05-06 ENCOUNTER — Ambulatory Visit (INDEPENDENT_AMBULATORY_CARE_PROVIDER_SITE_OTHER): Payer: Medicare Other | Admitting: Surgery

## 2021-05-06 ENCOUNTER — Ambulatory Visit: Payer: Self-pay

## 2021-05-06 VITALS — BP 111/63 | HR 73 | Ht 75.0 in | Wt 255.0 lb

## 2021-05-06 DIAGNOSIS — M7062 Trochanteric bursitis, left hip: Secondary | ICD-10-CM | POA: Diagnosis not present

## 2021-05-06 DIAGNOSIS — Z96642 Presence of left artificial hip joint: Secondary | ICD-10-CM

## 2021-05-06 NOTE — Progress Notes (Signed)
Office Visit Note   Patient: Jose Joseph           Date of Birth: 20-Aug-1955           MRN: [id number] Visit Date: 05/06/2021              Requested by: Carylon Perches, MD [address] [place],  [place] [address] PCP: Carylon Perches, MD   Assessment & Plan: Visit Diagnoses:  1. Status post total replacement of left hip   2. Greater trochanteric bursitis of left hip     Plan: Advised patient that I think he is doing extremely well 3-1/2 months postop.  I encouraged him to do some IT band stretching exercises and these were demonstrated.  He is going down to [place] and expected to return around March or April of next year and I will have him follow-up with Dr. Ophelia Charter in April for recheck.  If he is doing well he will follow-up at his yearly appointment in June.  All questions answered.  Follow-Up Instructions: Return in about 7 months (around 12/08/2021) for with dr yates.   Orders:  Orders Placed This Encounter  Procedures   XR HIP UNILAT W OR W/O PELVIS 2-3 VIEWS LEFT   No orders of the defined types were placed in this encounter.     Procedures: No procedures performed   Clinical Data: No additional findings.   Subjective: Chief Complaint  Patient presents with   Left Hip - Pain    HPI 65 year old white male who is status post left total hip replacement January 15, 2021 returns.  States that he has been recovering well.  He has had some soreness left lateral hip over the last several weeks.  He recently returned after doing a long drive to [place] and back.  States he has some soreness around the greater trochanter bursa.  No lumbar or radicular component.  He states that he is about to go down to [place] for 6 months to see his wife. Review of Systems No current cardiopulmonary GI GU issues.  Objective: Vital Signs: BP 111/63   Pulse 73   Ht 6\' 3"  (1.905 m)   Wt 255 lb (115.7 kg)   BMI 31.87 kg/m   Physical Exam Patient is ambulating very well.  He has mild  tenderness over the left hip greater trochanter bursa.  Negative logroll bilateral hips.  Mild left quad atrophy.  Neurologically intact. Ortho Exam  Specialty Comments:  No specialty comments available.  Imaging: No results found.   PMFS History: Patient Active Problem List   Diagnosis Date Noted   Status post total replacement of left hip 01/15/2021   Elevated PSA 04/10/2020   Pain in left hip 02/26/2020   Pain in right shoulder 02/26/2020   Past Medical History:  Diagnosis Date   Arthritis    Depression    Dyspnea    when he was smoking   History of kidney stones    2021   Pneumonia    during COVID in 2021    Family History  Problem Relation Age of Onset   Kidney cancer Sister    Prostate cancer Brother     Past Surgical History:  Procedure Laterality Date   KNEE ARTHROCENTESIS     NECK SURGERY     TOTAL HIP ARTHROPLASTY Left 01/15/2021   Procedure: LEFT TOTAL HIP ARTHROPLASTY ANTERIOR APPROACH;  Surgeon: 03/17/2021, MD;  Location: [facility] OR;  Service: Orthopedics;  Laterality: Left;  WRIST ARTHROPLASTY     Social History   Occupational History   Occupation: retired  Tobacco Use   Smoking status: Former    Packs/day: 1.00    Years: 35.00    Pack years: 35.00    Types: Cigarettes    Quit date: 2014    Years since quitting: 8.7   Smokeless tobacco: Never  Substance and Sexual Activity   Alcohol use: Yes    Alcohol/week: 2.0 standard drinks    Types: 2 Cans of beer per week   Drug use: Yes   Sexual activity: Not on file

## 2021-07-02 ENCOUNTER — Telehealth: Payer: Self-pay | Admitting: *Deleted

## 2021-07-02 NOTE — Telephone Encounter (Signed)
Ortho bundle 90 day call and survey completed. 

## 2021-07-19 IMAGING — RF DG HIP (WITH PELVIS) OPERATIVE*L*
1 series · 5 of 5 positions shown · non-contrast
Comparison: December 14, 2009.

CLINICAL DATA: Left total hip arthroplasty.

EXAM:
DG C-ARM 1-60 MIN; OPERATIVE LEFT HIP WITH PELVIS
FLUOROSCOPY TIME:  Fluoroscopy Time:  28 seconds.
Radiation Exposure Index (if provided by the fluoroscopic device):
3.52 mGy.
Number of Acquired Spot Images: 5

[Series 1: run · 5 of 5 slices shown]
[im 1/5]
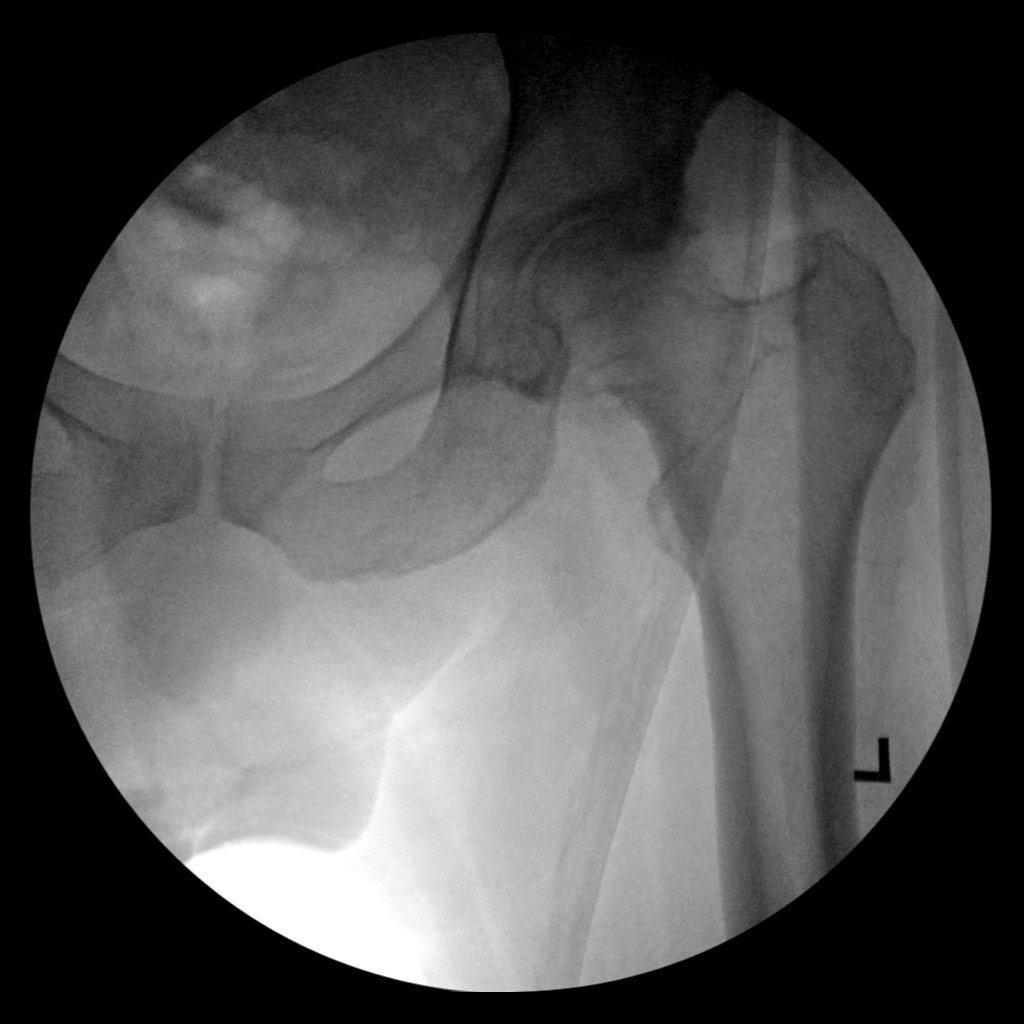
[im 2/5]
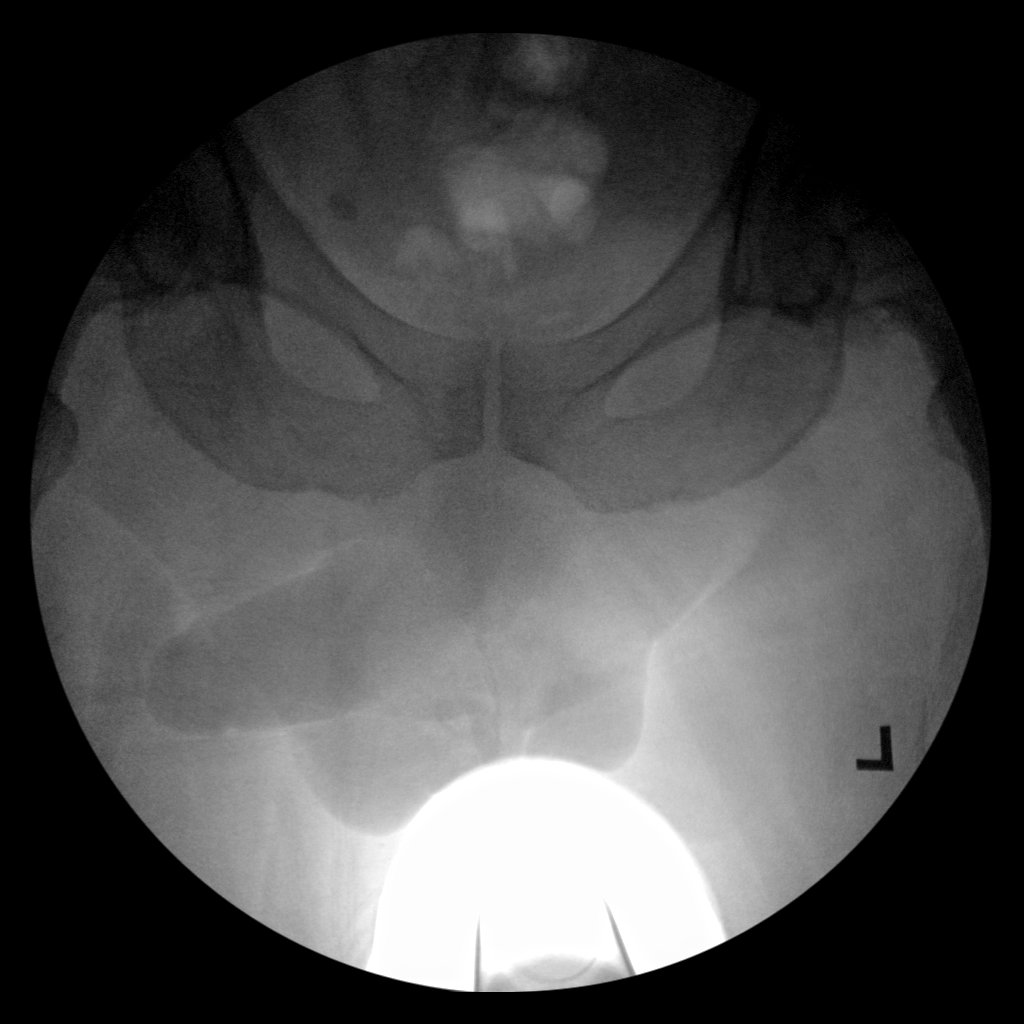
[im 3/5]
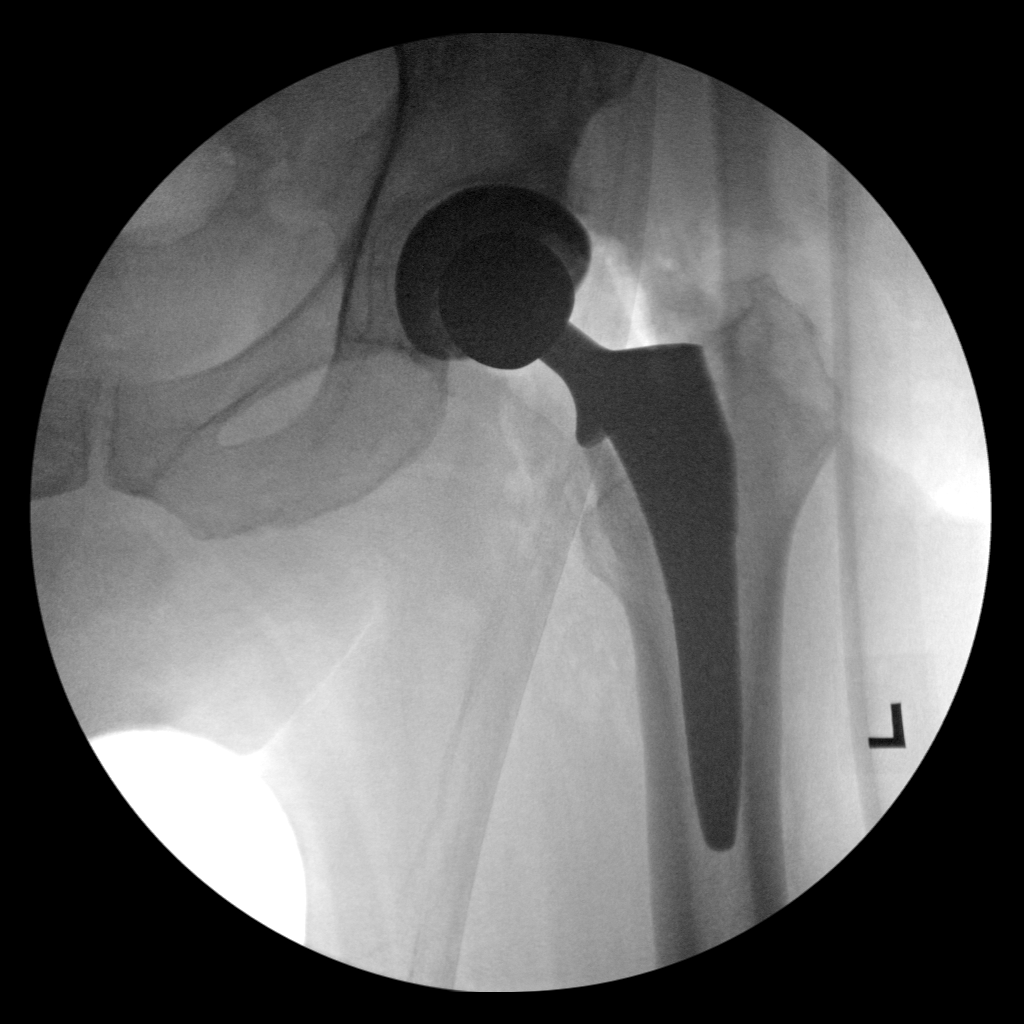
[im 4/5]
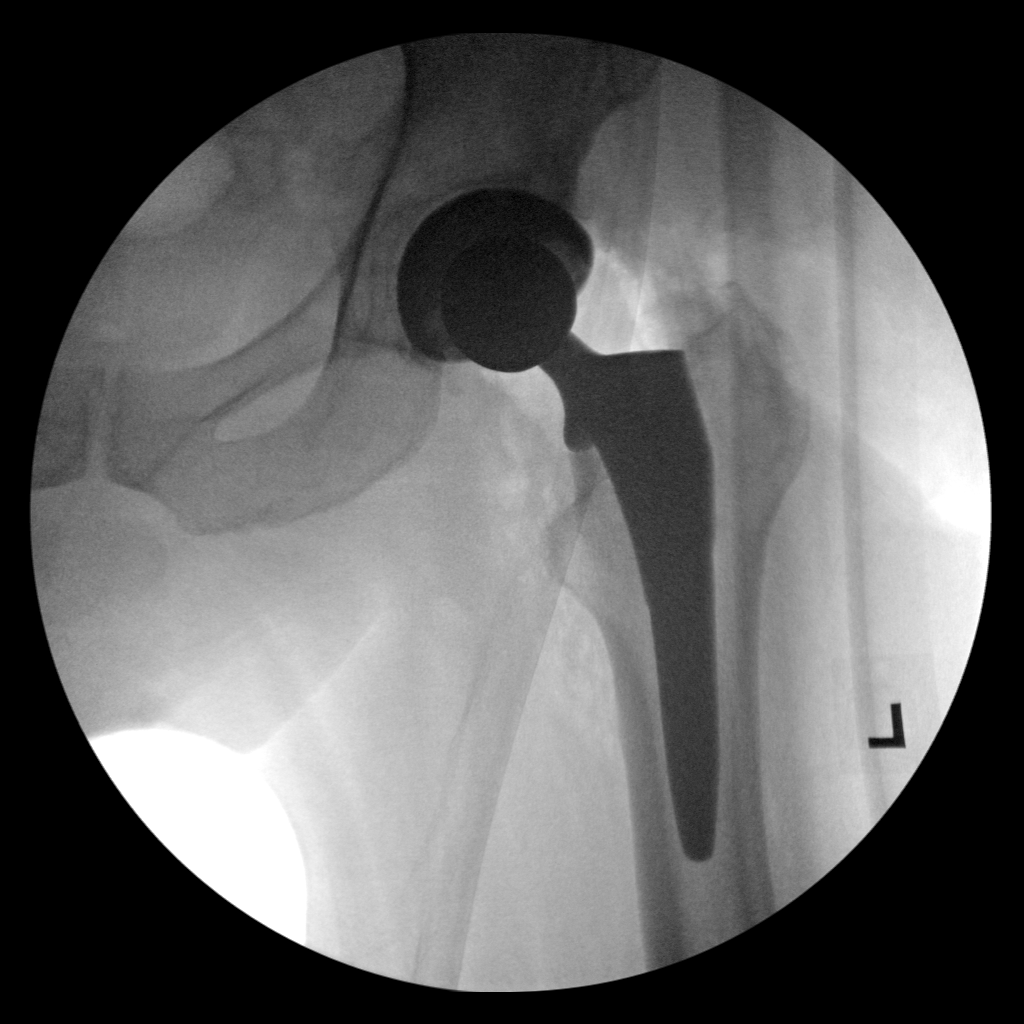
[im 5/5]
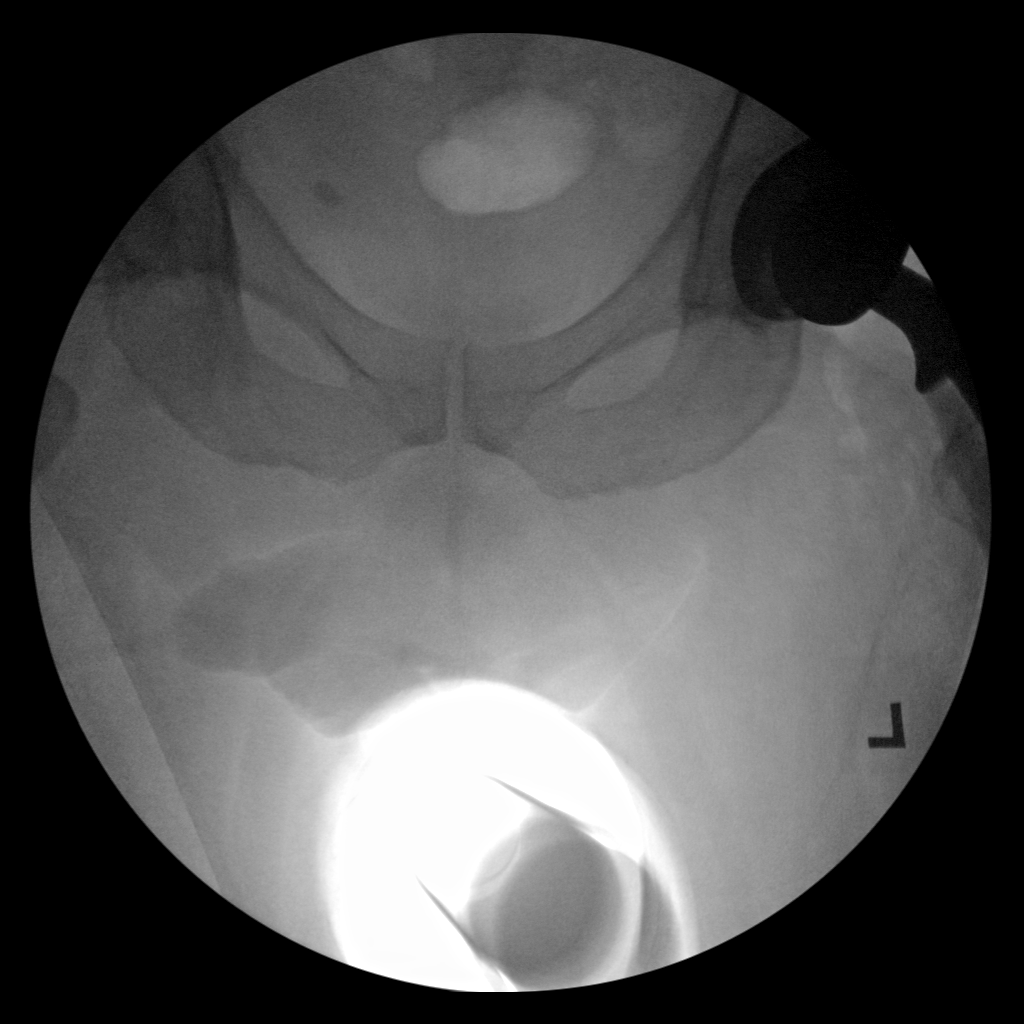

[5 of 5 positions shown; findings below may reference images not displayed]

FINDINGS: Five C-arm fluoroscopic images were obtained intraoperatively and
submitted for post operative interpretation. These images
demonstrate surgical changes of left total hip arthroplasty. No
unexpected findings. No unexpected foreign bodies. Please see the
performing provider's procedural report for further detail.
IMPRESSION: Intraoperative fluoroscopy, as detailed above.

## 2021-07-19 IMAGING — DX DG HIP (WITH OR WITHOUT PELVIS) 1V PORT*L*
2 series · 3 of 3 positions shown · non-contrast
Comparison: None.

CLINICAL DATA: Status post total hip replacement

EXAM:
DG HIP (WITH OR WITHOUT PELVIS) 1V PORT LEFT

[pelvis]
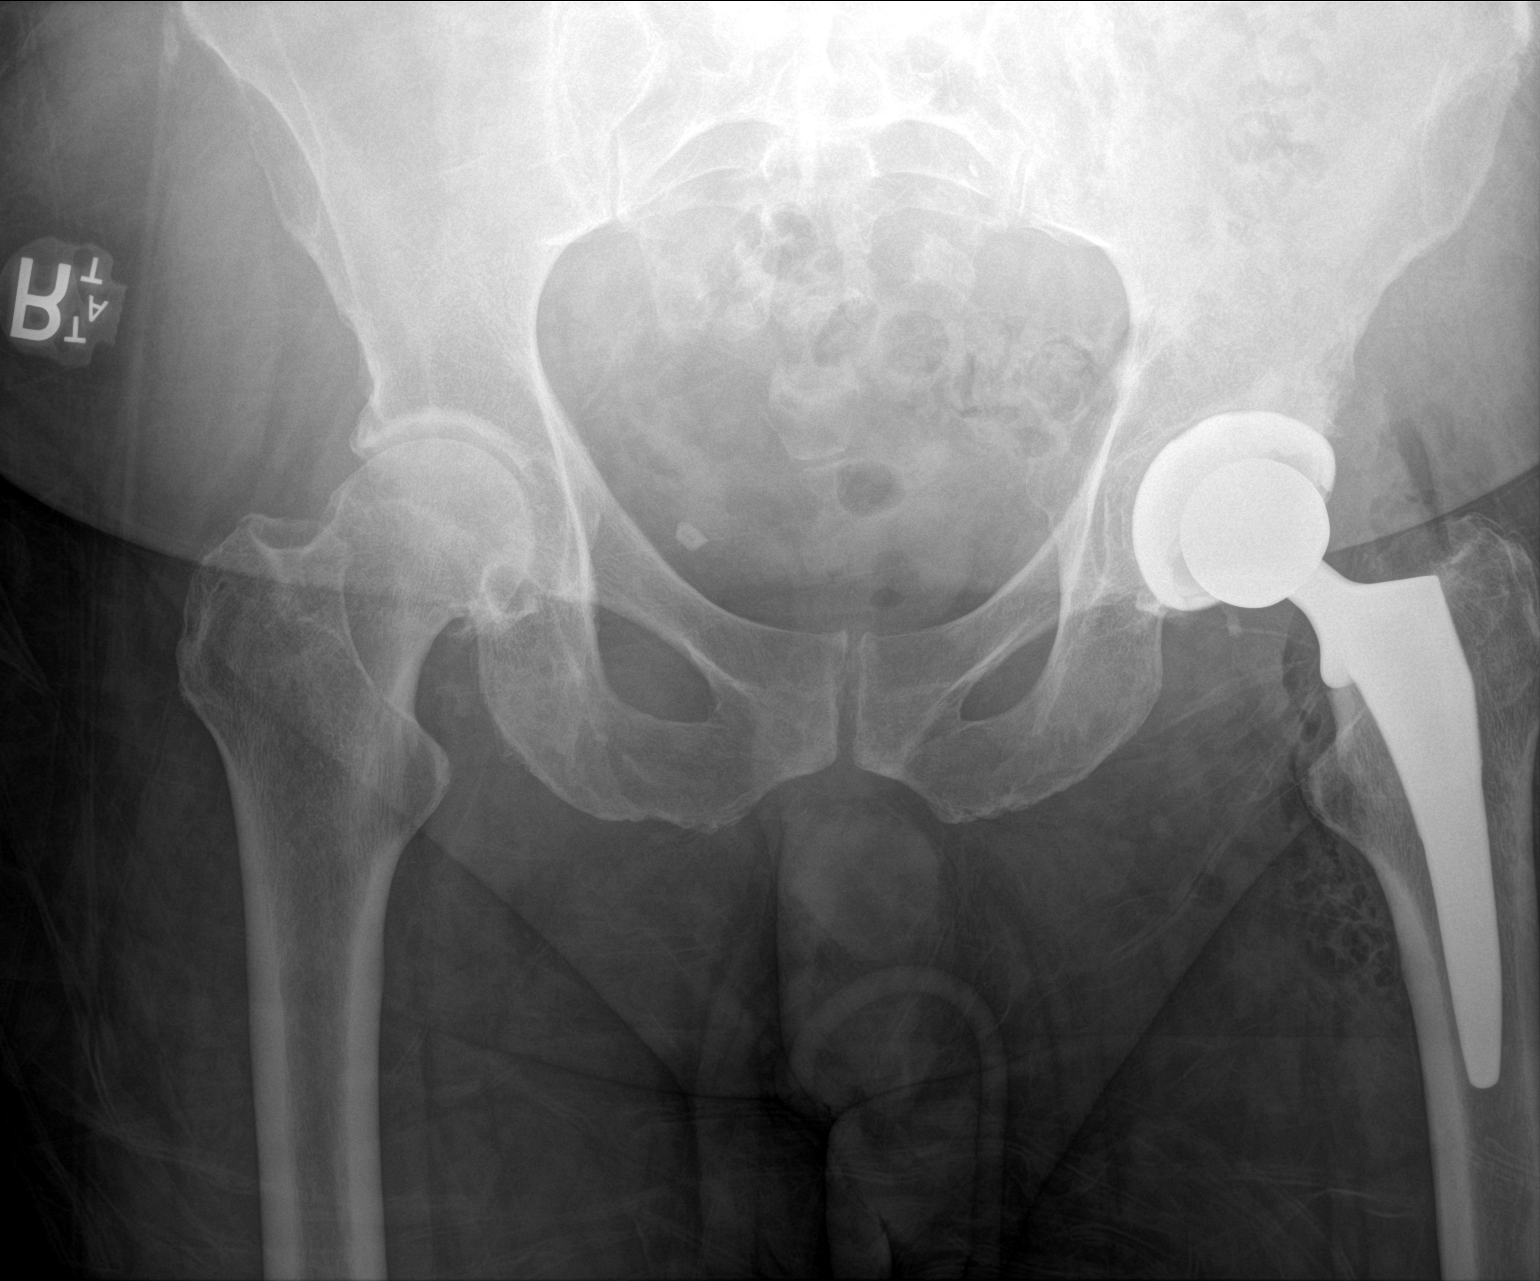

[Series 2: hip · 0.14mm/px · 2 of 2 slices shown]
[im 1/2]
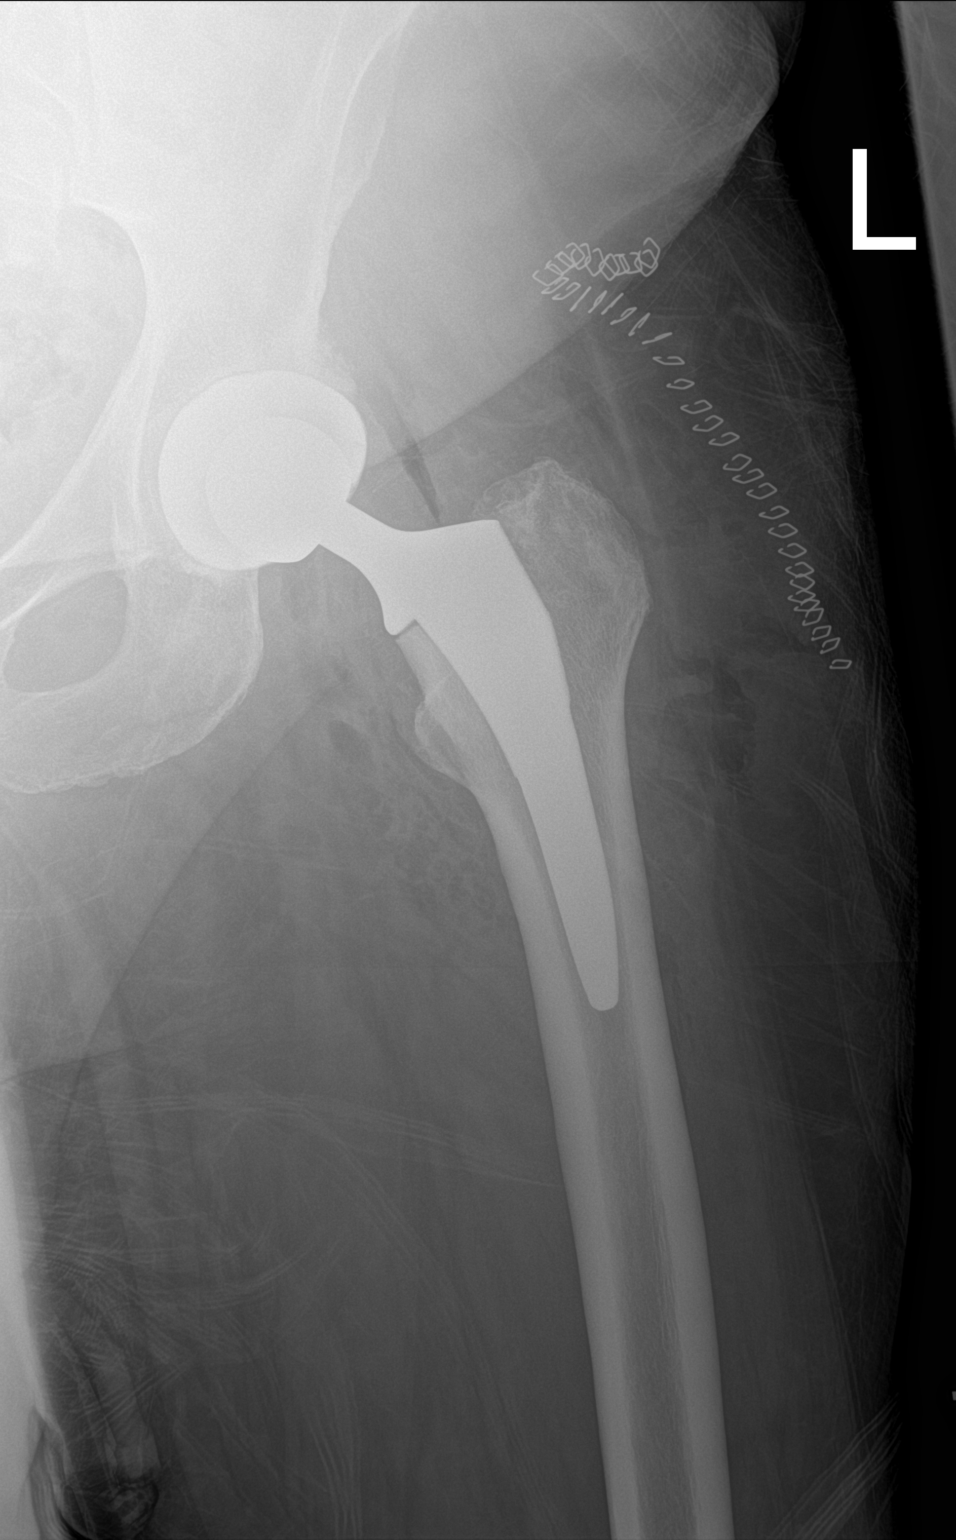
[im 2/2]
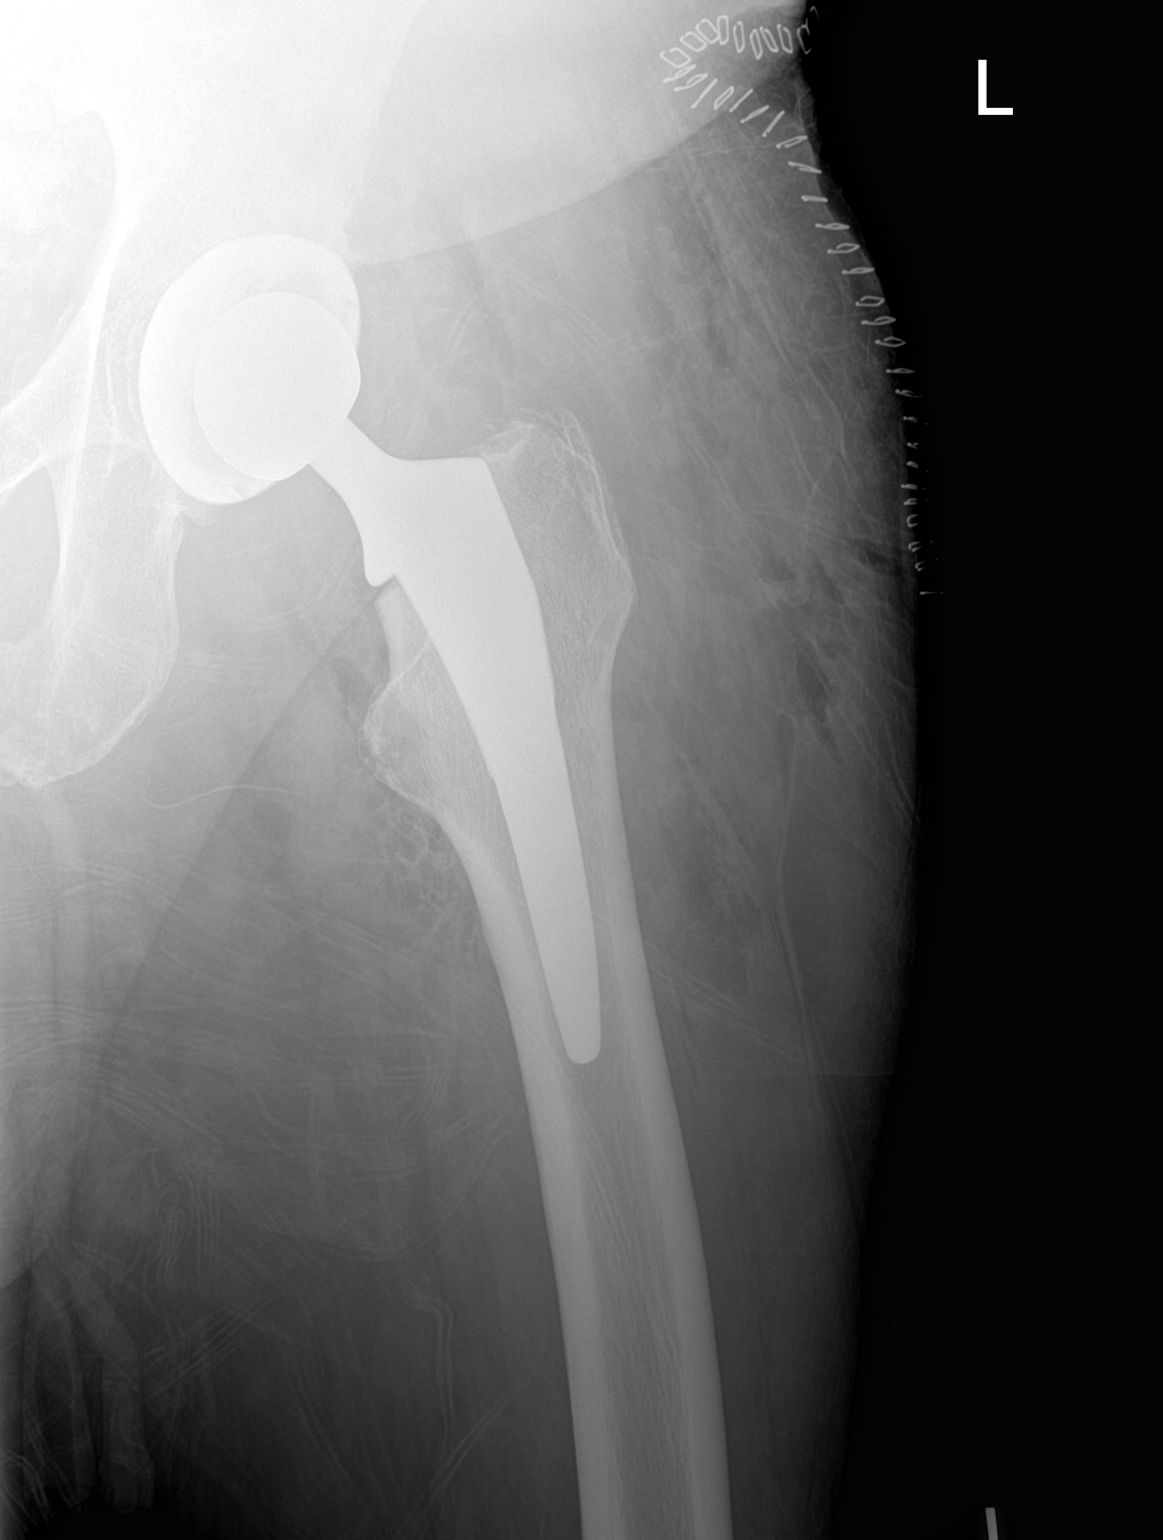

[3 of 3 positions shown; findings below may reference images not displayed]

FINDINGS: Frontal view shows most of the pelvis as well as bilateral hip
obtained. Patient is status post total hip replacement on the left
with prosthetic components well-seated on frontal view. No acute
fracture or dislocation. There is mild narrowing of the right hip.
Soft tissue air overlying skin staples noted on the left.
IMPRESSION: Status post total hip replacement on the left with prosthetic
components well-seated on frontal view. No acute fracture or
dislocation. Mild narrowing left hip joint. Acute postoperative
changes noted on the left.

## 2022-03-11 ENCOUNTER — Telehealth: Payer: Self-pay | Admitting: *Deleted

## 2022-03-11 NOTE — Telephone Encounter (Signed)
Ortho bundle 1 year post op call completed. ?

## 2022-12-07 ENCOUNTER — Encounter: Payer: Self-pay | Admitting: *Deleted

## 2022-12-26 ENCOUNTER — Telehealth: Payer: Self-pay | Admitting: *Deleted

## 2022-12-26 NOTE — Telephone Encounter (Signed)
  Procedure: Colonoscopy  Height: 6'3 Weight: 275lbs        Have you had a colonoscopy before?  ? 13-15 years ago  Do you have family history of colon cancer?  no  Do you have a family history of polyps? no  Previous colonoscopy with polyps removed? yes  Do you have a history colorectal cancer?   no  Are you diabetic?  no  Do you have a prosthetic or mechanical heart valve? no  Do you have a pacemaker/defibrillator?   no  Have you had endocarditis/atrial fibrillation?  no  Do you use supplemental oxygen/CPAP?  no  Have you had joint replacement within the last 12 months?  no  Do you tend to be constipated or have to use laxatives?  no   Do you have history of alcohol use? If yes, how much and how often.  no  Do you have history or are you using drugs? If yes, what do are you  using?  no  Have you ever had a stroke/heart attack?  no  Have you ever had a heart or other vascular stent placed,?no  Do you take weight loss medication? no  Do you take any blood-thinning medications such as: (Plavix, aspirin, Coumadin, Aggrenox, Brilinta, Xarelto, Eliquis, Pradaxa, Savaysa or Effient)? no  If yes we need the name, milligram, dosage and who is prescribing doctor:               Current Outpatient Medications  Medication Sig Dispense Refill   cetirizine (ZYRTEC) 10 MG tablet Take 10 mg by mouth in the morning.     citalopram (CELEXA) 20 MG tablet Take 20 mg by mouth in the morning.     fexofenadine (ALLEGRA) 180 MG tablet Take 180 mg by mouth in the morning.     No current facility-administered medications for this visit.    Allergies  Allergen Reactions   Bee Venom Anaphylaxis    syncope

## 2022-12-27 NOTE — Telephone Encounter (Signed)
OK to schedule. ASA 2.  ?

## 2022-12-28 ENCOUNTER — Encounter: Payer: Self-pay | Admitting: *Deleted

## 2022-12-28 ENCOUNTER — Telehealth: Payer: Self-pay | Admitting: Internal Medicine

## 2022-12-28 MED ORDER — PEG 3350-KCL-NA BICARB-NACL 420 G PO SOLR: 4000.0000 mL | Freq: Once | ORAL | 0 refills | Status: DC

## 2022-12-28 MED ORDER — PEG 3350-KCL-NA BICARB-NACL 420 G PO SOLR: 4000.0000 mL | Freq: Once | ORAL | 0 refills | Status: AC

## 2022-12-28 NOTE — Telephone Encounter (Signed)
Spoke with pt. Scheduled with Dr. Jena Gauss 6/6. Aware will send instructions. Rx for prep to pharmacy.

## 2022-12-28 NOTE — Telephone Encounter (Signed)
Patient left a message that he has jury duty the week that his colonoscopy is scheduled and needs to reschedule

## 2022-12-28 NOTE — Telephone Encounter (Signed)
Referral completed

## 2022-12-28 NOTE — Addendum Note (Signed)
Addended by: Armstead Peaks on: 12/28/2022 08:43 AM   Modules accepted: Orders

## 2022-12-29 NOTE — Telephone Encounter (Signed)
Pt says he will find out for sure on 01/13/23 if he will have jury duty the week of his colonoscopy, he will call us back on 01/16/23 to let us know for sure if he needs to cancel or keep the procedure time

## 2023-01-16 ENCOUNTER — Encounter (HOSPITAL_COMMUNITY): Payer: Self-pay

## 2023-01-16 ENCOUNTER — Other Ambulatory Visit: Payer: Self-pay

## 2023-01-16 ENCOUNTER — Encounter (HOSPITAL_COMMUNITY)
Admission: RE | Admit: 2023-01-16 | Discharge: 2023-01-16 | Disposition: A | Discharge status: Home / Self Care | Payer: Medicare Other | Source: Ambulatory Visit | Attending: Internal Medicine | Admitting: Internal Medicine

## 2023-01-19 ENCOUNTER — Encounter (HOSPITAL_COMMUNITY)
Admission: RE | Disposition: A | Discharge status: Home / Self Care | Payer: Self-pay | Source: Home / Self Care | Attending: Internal Medicine

## 2023-01-19 ENCOUNTER — Ambulatory Visit (HOSPITAL_COMMUNITY)
Admission: RE | Admit: 2023-01-19 | Discharge: 2023-01-19 | Disposition: A | Discharge status: Home / Self Care | Payer: Medicare Other | Attending: Internal Medicine | Admitting: Internal Medicine

## 2023-01-19 ENCOUNTER — Ambulatory Visit (HOSPITAL_BASED_OUTPATIENT_CLINIC_OR_DEPARTMENT_OTHER): Payer: Medicare Other | Admitting: Anesthesiology

## 2023-01-19 ENCOUNTER — Ambulatory Visit (HOSPITAL_COMMUNITY): Payer: Medicare Other | Admitting: Anesthesiology

## 2023-01-19 DIAGNOSIS — K5730 Diverticulosis of large intestine without perforation or abscess without bleeding: Secondary | ICD-10-CM

## 2023-01-19 DIAGNOSIS — J189 Pneumonia, unspecified organism: Secondary | ICD-10-CM

## 2023-01-19 DIAGNOSIS — D128 Benign neoplasm of rectum: Secondary | ICD-10-CM

## 2023-01-19 DIAGNOSIS — K621 Rectal polyp: Secondary | ICD-10-CM | POA: Diagnosis not present

## 2023-01-19 DIAGNOSIS — Z1211 Encounter for screening for malignant neoplasm of colon: Secondary | ICD-10-CM

## 2023-01-19 DIAGNOSIS — K5790 Diverticulosis of intestine, part unspecified, without perforation or abscess without bleeding: Secondary | ICD-10-CM | POA: Diagnosis not present

## 2023-01-19 DIAGNOSIS — D120 Benign neoplasm of cecum: Secondary | ICD-10-CM

## 2023-01-19 DIAGNOSIS — K635 Polyp of colon: Secondary | ICD-10-CM | POA: Diagnosis not present

## 2023-01-19 DIAGNOSIS — D126 Benign neoplasm of colon, unspecified: Secondary | ICD-10-CM

## 2023-01-19 DIAGNOSIS — Z87891 Personal history of nicotine dependence: Secondary | ICD-10-CM | POA: Insufficient documentation

## 2023-01-19 DIAGNOSIS — D124 Benign neoplasm of descending colon: Secondary | ICD-10-CM

## 2023-01-19 HISTORY — PX: COLONOSCOPY WITH PROPOFOL: SHX5780

## 2023-01-19 HISTORY — PX: POLYPECTOMY: SHX5525

## 2023-01-19 SURGERY — COLONOSCOPY WITH PROPOFOL
Anesthesia: General

## 2023-01-19 MED ORDER — PROPOFOL 500 MG/50ML IV EMUL
INTRAVENOUS | Status: DC | PRN
  Administered 2023-01-19: 150 ug/kg/min via INTRAVENOUS

## 2023-01-19 MED ORDER — PROPOFOL 10 MG/ML IV BOLUS
INTRAVENOUS | Status: DC | PRN
  Administered 2023-01-19: 100 mg via INTRAVENOUS

## 2023-01-19 MED ORDER — LACTATED RINGERS IV SOLN: INTRAVENOUS | Status: DC | PRN

## 2023-01-19 MED ORDER — LIDOCAINE HCL (CARDIAC) PF 100 MG/5ML IV SOSY
PREFILLED_SYRINGE | INTRAVENOUS | Status: DC | PRN
  Administered 2023-01-19: 50 mg via INTRAVENOUS

## 2023-01-19 NOTE — Anesthesia Preprocedure Evaluation (Signed)
Anesthesia Evaluation  Patient identified by MRN, date of birth, ID band Patient awake    Reviewed: Allergy & Precautions, H&P , NPO status , Patient's Chart, lab work & pertinent test results, reviewed documented beta blocker date and time   Airway Mallampati: II  TM Distance: >3 FB Neck ROM: full    Dental no notable dental hx.    Pulmonary neg pulmonary ROS, shortness of breath, pneumonia, former smoker   Pulmonary exam normal breath sounds clear to auscultation       Cardiovascular Exercise Tolerance: Good negative cardio ROS  Rhythm:regular Rate:Normal     Neuro/Psych  PSYCHIATRIC DISORDERS  Depression    negative neurological ROS  negative psych ROS   GI/Hepatic negative GI ROS, Neg liver ROS,,,  Endo/Other  negative endocrine ROS    Renal/GU negative Renal ROS  negative genitourinary   Musculoskeletal   Abdominal   Peds  Hematology negative hematology ROS (+)   Anesthesia Other Findings   Reproductive/Obstetrics negative OB ROS                             Anesthesia Physical Anesthesia Plan  ASA: 2  Anesthesia Plan: General   Post-op Pain Management:    Induction:   PONV Risk Score and Plan: Propofol infusion  Airway Management Planned:   Additional Equipment:   Intra-op Plan:   Post-operative Plan:   Informed Consent: I have reviewed the patients History and Physical, chart, labs and discussed the procedure including the risks, benefits and alternatives for the proposed anesthesia with the patient or authorized representative who has indicated his/her understanding and acceptance.     Dental Advisory Given  Plan Discussed with: CRNA  Anesthesia Plan Comments:        Anesthesia Quick Evaluation

## 2023-01-19 NOTE — Discharge Instructions (Signed)
  Colonoscopy Discharge Instructions  Read the instructions outlined below and refer to this sheet in the next few weeks. These discharge instructions provide you with general information on caring for yourself after you leave the hospital. Your doctor may also give you specific instructions. While your treatment has been planned according to the most current medical practices available, unavoidable complications occasionally occur. If you have any problems or questions after discharge, call Dr. Jena Gauss at [phone]. ACTIVITY You may resume your regular activity, but move at a slower pace for the next 24 hours.  Take frequent rest periods for the next 24 hours.  Walking will help get rid of the air and reduce the bloated feeling in your belly (abdomen).  No driving for 24 hours (because of the medicine (anesthesia) used during the test).   Do not sign any important legal documents or operate any machinery for 24 hours (because of the anesthesia used during the test).  NUTRITION Drink plenty of fluids.  You may resume your normal diet as instructed by your doctor.  Begin with a light meal and progress to your normal diet. Heavy or fried foods are harder to digest and may make you feel sick to your stomach (nauseated).  Avoid alcoholic beverages for 24 hours or as instructed.  MEDICATIONS You may resume your normal medications unless your doctor tells you otherwise.  WHAT YOU CAN EXPECT TODAY Some feelings of bloating in the abdomen.  Passage of more gas than usual.  Spotting of blood in your stool or on the toilet paper.  IF YOU HAD POLYPS REMOVED DURING THE COLONOSCOPY: No aspirin products for 7 days or as instructed.  No alcohol for 7 days or as instructed.  Eat a soft diet for the next 24 hours.  FINDING OUT THE RESULTS OF YOUR TEST Not all test results are available during your visit. If your test results are not back during the visit, make an appointment with your caregiver to find out the  results. Do not assume everything is normal if you have not heard from your caregiver or the medical facility. It is important for you to follow up on all of your test results.  SEEK IMMEDIATE MEDICAL ATTENTION IF: You have more than a spotting of blood in your stool.  Your belly is swollen (abdominal distention).  You are nauseated or vomiting.  You have a temperature over 101.  You have abdominal pain or discomfort that is severe or gets worse throughout the day.       4 polyps removed from your colon today  Diverticulosis and polyp information provided   further recommendations to follow pending review of pathology report  At patient request, I called Bosie Clos at [phone]-unable to reach.

## 2023-01-19 NOTE — H&P (Signed)
@LOGO @   Primary Care Physician:  Carylon Perches, MD Primary Gastroenterologist:  Dr. Jena Gauss  Pre-Procedure History & Physical: HPI:  Jose Joseph is a 67 y.o. male is here for a screening colonoscopy.   Past Medical History:  Diagnosis Date   Arthritis    Depression    Dyspnea    when he was smoking   History of kidney stones    2021   Pneumonia    during COVID in 2021    Past Surgical History:  Procedure Laterality Date   KNEE ARTHROCENTESIS     NECK SURGERY     TOTAL HIP ARTHROPLASTY Left 01/15/2021   Procedure: LEFT TOTAL HIP ARTHROPLASTY ANTERIOR APPROACH;  Surgeon: Eldred Manges, MD;  Location: [facility] OR;  Service: Orthopedics;  Laterality: Left;   WRIST ARTHROPLASTY      Prior to Admission medications   Medication Sig Start Date End Date Taking? Authorizing Provider  cetirizine (ZYRTEC) 10 MG tablet Take 10 mg by mouth in the morning.   Yes [provider]  citalopram (CELEXA) 20 MG tablet Take 20 mg by mouth in the morning.   Yes [provider]  fexofenadine (ALLEGRA) 180 MG tablet Take 180 mg by mouth in the morning.   Yes [provider]  predniSONE (DELTASONE) 20 MG tablet Take 60 mg by mouth daily with breakfast. Taking for sudden hearing loss   Yes [provider]    Allergies as of 12/28/2022 - Review Complete 05/06/2021  Allergen Reaction Noted   Bee venom Anaphylaxis 04/10/2020    Family History  Problem Relation Age of Onset   Kidney cancer Sister    Prostate cancer Brother     Social History   Socioeconomic History   Marital status: Married    Spouse name: Not on file   Number of children: 2   Years of education: Not on file   Highest education level: Not on file  Occupational History   Occupation: retired  Tobacco Use   Smoking status: Former    Packs/day: 1.00    Years: 35.00    Additional pack years: 0.00    Total pack years: 35.00    Types: Cigarettes    Quit date: 2014    Years since quitting: 10.4    Smokeless tobacco: Never  Substance and Sexual Activity   Alcohol use: Yes    Alcohol/week: 2.0 standard drinks of alcohol    Types: 2 Cans of beer per week   Drug use: Yes    Types: Marijuana   Sexual activity: Not on file  Other Topics Concern   Not on file  Social History Narrative   Not on file   Social Determinants of Health   Financial Resource Strain: Not on file  Food Insecurity: Not on file  Transportation Needs: Not on file  Physical Activity: Not on file  Stress: Not on file  Social Connections: Not on file  Intimate Partner Violence: Not on file    Review of Systems: See HPI, otherwise negative ROS  Physical Exam: BP 113/70 (BP Location: Right Arm)   Pulse (!) 57   Temp 97.9 F (36.6 C)   SpO2 96%  General:   Alert,  Well-developed, well-nourished, pleasant and cooperative in NAD Head:  Normocephalic and atraumatic. Neck:  Supple; no masses or thyromegaly. Lungs:  Clear throughout to auscultation.   No wheezes, crackles, or rhonchi. No acute distress. Heart:  Regular rate and rhythm; no murmurs, clicks, rubs,  or gallops.  Abdomen:  Soft, nontender and nondistended. No masses, hepatosplenomegaly or hernias noted. Normal bowel sounds, without guarding, and without rebound.    Impression/Plan: Jose Joseph is now here to undergo a screening colonoscopy.    Average risk screening colonoscopy.  Risks, benefits, limitations, imponderables and alternatives regarding colonoscopy have been reviewed with the patient. Questions have been answered. All parties agreeable.     Notice:  This dictation was prepared with Dragon dictation along with smaller phrase technology. Any transcriptional errors that result from this process are unintentional and may not be corrected upon review.

## 2023-01-19 NOTE — Anesthesia Procedure Notes (Addendum)
Date/Time: 01/19/2023 2:33 PM  Performed by: Julian Reil, CRNAPre-anesthesia Checklist: Patient identified, Emergency Drugs available, Suction available and Patient being monitored Patient Re-evaluated:Patient Re-evaluated prior to induction Oxygen Delivery Method: Nasal cannula Induction Type: IV induction Placement Confirmation: positive ETCO2

## 2023-01-19 NOTE — Transfer of Care (Signed)
Immediate Anesthesia Transfer of Care Note  Patient: Jose Joseph  Procedure(s) Performed: COLONOSCOPY WITH PROPOFOL POLYPECTOMY  Patient Location: Short Stay  Anesthesia Type:General  Level of Consciousness: drowsy  Airway & Oxygen Therapy: Patient Spontanous Breathing  Post-op Assessment: Report given to RN and Post -op Vital signs reviewed and stable  Post vital signs: Reviewed and stable  Last Vitals:  Vitals Value Taken Time  BP    Temp    Pulse    Resp    SpO2      Last Pain:  Vitals:   01/19/23 1428  PainSc: 0-No pain         Complications: No notable events documented.

## 2023-01-19 NOTE — Op Note (Signed)
[facility] Patient Name: Jose Joseph Procedure Date: 01/19/2023 2:16 PM MRN: [id number] Date of Birth: [birth year] Attending MD: Gennette Pac , MD, [id number] CSN: [id number] Age: 67 Admit Type: Outpatient Procedure:                Colonoscopy Indications:              Screening for colorectal malignant neoplasm Providers:                Gennette Pac, MD, Jannett Celestine, RN, Dyann Ruddle Referring MD:              Medicines:                Propofol per Anesthesia Complications:            No immediate complications. Estimated Blood Loss:     Estimated blood loss was minimal. Procedure:                Pre-Anesthesia Assessment:                           - Prior to the procedure, a History and Physical                            was performed, and patient medications and                            allergies were reviewed. The patient's tolerance of                            previous anesthesia was also reviewed. The risks                            and benefits of the procedure and the sedation                            options and risks were discussed with the patient.                            All questions were answered, and informed consent                            was obtained. Prior Anticoagulants: The patient has                            taken no anticoagulant or antiplatelet agents. ASA                            Grade Assessment: III - A patient with severe                            systemic disease. After reviewing the risks and  benefits, the patient was deemed in satisfactory                            condition to undergo the procedure.                           After obtaining informed consent, the colonoscope                            was passed under direct vision. Throughout the                            procedure, the patient's blood pressure, pulse, and                            oxygen  saturations were monitored continuously. The                            [id number]) scope was introduced through the                            anus and advanced to the the cecum, identified by                            appendiceal orifice and ileocecal valve. The                            colonoscopy was performed without difficulty. The                            patient tolerated the procedure well. The quality                            of the bowel preparation was adequate. The                            ileocecal valve, appendiceal orifice, and rectum                            were photographed. The entire colon was well                            visualized. Scope In: 2:32:38 PM Scope Out: 2:48:04 PM Scope Withdrawal Time: 0 hours 10 minutes 48 seconds  Total Procedure Duration: 0 hours 15 minutes 26 seconds  Findings:      The perianal and digital rectal examinations were normal.      Four sessile polyps were found in the rectum, descending colon and       cecum. The polyps were 3 to 5 mm in size. These polyps were removed with       a cold snare. Resection and retrieval were complete. Estimated blood       loss was minimal.      Scattered medium-mouthed diverticula were found in the entire colon.      The exam was otherwise without abnormality  on direct and retroflexion       views. Impression:               - Four 3 to 5 mm polyps in the rectum, in the                            descending colon and in the cecum, removed with a                            cold snare. Resected and retrieved.                           - Diverticulosis in the entire examined colon.                           - The examination was otherwise normal on direct                            and retroflexion views. Moderate Sedation:      Moderate (conscious) sedation was personally administered by an       anesthesia professional. The following parameters were monitored: oxygen        saturation, heart rate, blood pressure, respiratory rate, EKG, adequacy       of pulmonary ventilation, and response to care. Recommendation:           - Patient has a contact number available for                            emergencies. The signs and symptoms of potential                            delayed complications were discussed with the                            patient. Return to normal activities tomorrow.                            Written discharge instructions were provided to the                            patient.                           - Resume previous diet.                           - Repeat colonoscopy date to be determined after                            pending pathology results are reviewed for                            surveillance.                           - Return to GI office (date not  yet determined). Procedure Code(s):        --- Professional ---                           [phone], Colonoscopy, flexible; with removal of                            tumor(s), polyp(s), or other lesion(s) by snare                            technique Diagnosis Code(s):        --- Professional ---                           Z12.11, Encounter for screening for malignant                            neoplasm of colon                           D12.8, Benign neoplasm of rectum                           D12.4, Benign neoplasm of descending colon                           D12.0, Benign neoplasm of cecum                           K57.30, Diverticulosis of large intestine without                            perforation or abscess without bleeding CPT copyright 2022 American Medical Association. All rights reserved. The codes documented in this report are preliminary and upon coder review may  be revised to meet current compliance requirements. Gerrit Friends. Kelee Cunningham, MD Gennette Pac, MD 01/19/2023 2:55:10 PM This report has been signed electronically. Number of Addenda: 0

## 2023-01-20 NOTE — Anesthesia Postprocedure Evaluation (Signed)
Anesthesia Post Note  Patient: Jose Joseph  Procedure(s) Performed: COLONOSCOPY WITH PROPOFOL POLYPECTOMY  Patient location during evaluation: Phase II Anesthesia Type: General Level of consciousness: awake Pain management: pain level controlled Vital Signs Assessment: post-procedure vital signs reviewed and stable Respiratory status: spontaneous breathing and respiratory function stable Cardiovascular status: blood pressure returned to baseline and stable Postop Assessment: no headache and no apparent nausea or vomiting Anesthetic complications: no Comments: Late entry   No notable events documented.   Last Vitals:  Vitals:   01/19/23 1230 01/19/23 1452  BP: 113/70 (!) 87/54  Pulse: (!) 57 (!) 55  Resp:  16  Temp: 36.6 C 36.7 C  SpO2: 96% 97%    Last Pain:  Vitals:   01/19/23 1452  TempSrc: Oral  PainSc: 0-No pain                 Windell Norfolk

## 2023-01-23 ENCOUNTER — Encounter: Payer: Self-pay | Admitting: Internal Medicine

## 2023-01-23 LAB — SURGICAL PATHOLOGY

## 2023-01-27 ENCOUNTER — Encounter (HOSPITAL_COMMUNITY): Payer: Self-pay | Admitting: Internal Medicine

## 2023-08-16 DIAGNOSIS — C7801 Secondary malignant neoplasm of right lung: Secondary | ICD-10-CM

## 2023-08-16 HISTORY — DX: Secondary malignant neoplasm of right lung: C78.01

## 2023-10-06 ENCOUNTER — Telehealth: Payer: Self-pay | Admitting: Emergency Medicine

## 2023-10-06 NOTE — Telephone Encounter (Signed)
 Dr. Delton Coombes, please advise if anything is needed from Dr. Alonza Smoker office?

## 2023-10-06 NOTE — Telephone Encounter (Signed)
 This patient needs office visit with RB or SG to evaluate abnormal CT chest.  Can place him in any blocked spot or nodule spot.  He will bring his imaging disc with him Phone [phone]

## 2023-10-06 NOTE — Telephone Encounter (Signed)
 Toniann Fail, Dr. Nile Riggs nurse is calling. She says Dr. Rayburn Ma and Dr. Delton Coombes spoke yesterday about this PT. Dr. Rayburn Ma is asking if there is anything Dr. Delton Coombes needs from their office to further assist this PT. Their # is [phone]  They will send Demographics and Dr. Nile Riggs office notes via fax. Pt also will hand carry his disk.

## 2023-10-09 NOTE — Telephone Encounter (Signed)
 We are working on getting this patient scheduled with either RB or SG as soon as possible in a nodule slot or block slot.  I do not see anything scheduled yet.  I think we have everything from Dr. Alonza Smoker office we just need to get him scheduled

## 2023-10-09 NOTE — Telephone Encounter (Signed)
 I called and spoke with the pt and scheduled him for 10/16/23 with Sarah  Pt aware to bring disc  Nothing further needed

## 2023-10-09 NOTE — Telephone Encounter (Signed)
 Front, please schedule. Thanks

## 2023-10-09 NOTE — Telephone Encounter (Signed)
 Routing to Dr. Delton Coombes and Maralyn Sago as an Jose Joseph

## 2023-10-10 NOTE — Telephone Encounter (Signed)
 Thank you :)

## 2023-10-16 ENCOUNTER — Encounter: Payer: Self-pay | Admitting: Acute Care

## 2023-10-16 ENCOUNTER — Inpatient Hospital Stay
Admission: RE | Admit: 2023-10-16 | Discharge: 2023-10-16 | Disposition: A | Discharge status: Home / Self Care | Payer: Self-pay | Source: Ambulatory Visit | Attending: Acute Care | Admitting: Acute Care

## 2023-10-16 ENCOUNTER — Ambulatory Visit: Payer: BLUE CROSS/BLUE SHIELD | Admitting: Acute Care

## 2023-10-16 VITALS — BP 124/79 | HR 74 | Temp 97.6°F | Ht 73.0 in | Wt 271.6 lb

## 2023-10-16 DIAGNOSIS — R911 Solitary pulmonary nodule: Secondary | ICD-10-CM

## 2023-10-16 DIAGNOSIS — R0609 Other forms of dyspnea: Secondary | ICD-10-CM

## 2023-10-16 DIAGNOSIS — Z87891 Personal history of nicotine dependence: Secondary | ICD-10-CM | POA: Diagnosis not present

## 2023-10-16 DIAGNOSIS — J42 Unspecified chronic bronchitis: Secondary | ICD-10-CM

## 2023-10-16 MED ORDER — ALBUTEROL SULFATE HFA 108 (90 BASE) MCG/ACT IN AERS
2.0000 | INHALATION_SPRAY | Freq: Four times a day (QID) | RESPIRATORY_TRACT | 2 refills | Status: DC | PRN
Start: 2023-10-16 — End: 2024-01-02

## 2023-10-16 MED ORDER — TRELEGY ELLIPTA 100-62.5-25 MCG/ACT IN AEPB: 1.0000 | INHALATION_SPRAY | Freq: Every day | RESPIRATORY_TRACT | Status: DC

## 2023-10-16 NOTE — Patient Instructions (Addendum)
 It is good to see you today. We will get a PET scan to better evaluate the nodule of concern in your right upper lobe. You will get a call to get this scheduled. You will follow up with me 1 week after the scan to review the results. You will get a call to get this follow up appointment scheduled after the PET scan has been scheduled.  We will give you samples of Trelegy. Take 1 puff once daily. Rinse mouth after use. Albuterol 1-2 puffs as needed for shortness of breath or wheezing. If you like it, let us know and we can send in a prescription. Please contact office for sooner follow up if symptoms do not improve or worsen or seek emergency care

## 2023-10-16 NOTE — Progress Notes (Signed)
 History of Present Illness Jose Joseph is a 68 y.o. male former smoker ( Quit 2013 with a 42 pack year smoking history  referred for lung nodule consult 10/16/2023. He will be followed by Dr. Delton Coombes.    10/16/2023 Jose Joseph is a 68 year old male former smoker who presents with lung nodules found on imaging 07/2023.Marland Kitchen He is accompanied by his wife, who assisted with translation of Spanish imaging report, as we have been unable to get the CD images to open in the office today.  Lung nodules were identified on imaging performed in [place] 07/2023 , and show a significant right lung lesion in the 4.3 x 3.5 cm, described as spiculated with satellite lesions.  Additional smaller nodules are present in both lungs.  Pt. has been experiencing respiratory symptoms since January 2025, starting with a cold that progressed to involve his lungs. Initially, he had a slight fever that resolved. He describes a persistent cough with sputum that is clear to yellow, occasionally gray, but no hemoptysis. He experiences shortness of breath with exertion and wheezing, particularly at night. He was given  Trelegy and a nasal spray in for sinus issues in [place],  which he found helpful. He has not used the  albuterol inhaler  that was also provided at that time, but has it available for shortness of breath. He denies any  significant weight loss or hemoptysis.  He has a history of smoking, having quit in 2013 after smoking approximately a pack a day since age 84. 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  His family history is significant for various cancers, including lung cancer in an uncle, prostate cancer in a brother, kidney cancer in a sister, pancreatic cancer in another brother, and an unspecified cancer in a paternal aunt.  As we are unable to down load images of the CT Chest done  in [place] we will order a PET scan to better evaluate the nodule of concern. If PET is abnormal we will obtain a Super D Ct Chest for navigational bronchoscopy with biopsies.  Test Results: Interpretation of Spanish transcription of CT Chest Per wife's interpretation of the Spanish written report, there is a  right lung lesion measuring  4.3 x 3.5 cm, described as spiculated with satellite lesions.     Latest Ref Rng & Units 01/16/2021    6:23 AM 01/12/2021    1:06 PM 01/08/2009    2:49 PM  CBC  WBC 4.0 - 10.5 K/uL 12.5  8.8  10.5   Hemoglobin 13.0 - 17.0 g/dL 29.5  18.8  41.6   Hematocrit 39.0 - 52.0 % 34.7  46.7  48.3   Platelets 150 - 400 K/uL 174  221  229        Latest Ref Rng & Units 01/16/2021    6:23 AM 01/12/2021    1:06 PM 01/08/2009    2:49 PM  BMP  Glucose 70 - 99 mg/dL 606  95  89   BUN 8 - 23 mg/dL 13  17  10    Creatinine 0.61 - 1.24 mg/dL 3.01  6.01  0.93   Sodium 135 - 145 mmol/L 135  138  138   Potassium 3.5 - 5.1 mmol/L 3.4  4.2  3.9   Chloride 98 - 111 mmol/L 103  102  101   CO2  22 - 32 mmol/L 24  26  29    Calcium 8.9 - 10.3 mg/dL 8.4  9.5  9.7     BNP No results found for: "BNP"  ProBNP No results found for: "PROBNP"  PFT No results found for: "FEV1PRE", "FEV1POST", "FVCPRE", "FVCPOST", "TLC", "DLCOUNC", "PREFEV1FVCRT", "PSTFEV1FVCRT"  No results found.   Past medical hx Past Medical History:  Diagnosis Date   Arthritis    Depression    Dyspnea    when he was smoking   History of kidney stones    2021   Pneumonia    during COVID in 2021     Social History   Tobacco Use   Smoking status: Former    Current packs/day: 0.00    Average packs/day: 1 pack/day for 35.0 years (35.0 ttl pk-yrs)    Types: Cigarettes    Start date: 65    Quit date: 2014    Years since quitting: 11.1   Smokeless tobacco: Never  Substance Use Topics   Alcohol use: Yes    Alcohol/week: 2.0 standard drinks of alcohol    Types: 2 Cans of beer per week   Drug use:  Yes    Types: Marijuana    Mr.Turnley reports that he quit smoking about 11 years ago. His smoking use included cigarettes. He started smoking about 46 years ago. He has a 35 pack-year smoking history. He has never used smokeless tobacco. He reports current alcohol use of about 2.0 standard drinks of alcohol per week. He reports current drug use. Drug: Marijuana. Former smoker quit 2013 with a 42 pack year smoking history  Tobacco Cessation: Former smoker   Past surgical hx, Family hx, Social hx all reviewed.  Current Outpatient Medications on File Prior to Visit  Medication Sig   cetirizine (ZYRTEC) 10 MG tablet Take 10 mg by mouth in the morning.   citalopram (CELEXA) 20 MG tablet Take 20 mg by mouth in the morning.   fexofenadine (ALLEGRA) 180 MG tablet Take 180 mg by mouth in the morning.   No current facility-administered medications on file prior to visit.     Allergies  Allergen Reactions   Bee Venom Anaphylaxis    syncope    Review Of Systems:  Constitutional:   No  weight loss, night sweats,  Fevers, chills, fatigue, or  lassitude.  HEENT:   No headaches,  Difficulty swallowing,  Tooth/dental problems, or  Sore throat,                No sneezing, itching, ear ache, + nasal congestion,+ post nasal drip,   CV:  No chest pain,  Orthopnea, PND, swelling in lower extremities, anasarca, dizziness, palpitations, syncope.   GI  No heartburn, indigestion, abdominal pain, nausea, vomiting, diarrhea, change in bowel habits, loss of appetite, bloody stools.   Resp: + shortness of breath with exertion less at rest.  + excess mucus, no productive cough,  No non-productive cough,  No coughing up of blood.  No change in color of mucus.  No wheezing.  No chest wall deformity  Skin: no rash or lesions.  GU: no dysuria, change in color of urine, no urgency or frequency.  No flank pain, no hematuria   MS:  No joint pain or swelling.  No decreased range of motion.  No back  pain.  Psych:  No change in mood or affect. No depression or anxiety.  No memory loss.   Vital Signs BP 124/79 (BP Location: Left Arm, Patient Position:  Sitting, Cuff Size: Large)   Pulse 74   Temp 97.6 F (36.4 C) (Oral)   Ht 6\' 1"  (1.854 m)   Wt 271 lb 9.6 oz (123.2 kg)   SpO2 95%   BMI 35.83 kg/m    Physical Exam:  General- Elderly male, in no distress, sitting in exam chair ENT: No sinus tenderness, TM clear, edematous  nasal mucosa, no oral exudate,+ post nasal drip, no LAN Cardiac: S1, S2, regular rate and rhythm, no murmur Chest: No wheeze/ rales/ dullness; no accessory muscle use, no nasal flaring, no sternal retractions Abd.: Soft Non-tender, ND, BS +, Body mass index is 35.83 kg/m.  Ext: No clubbing cyanosis, edema Neuro:  normal strength, MAE x 4, A&O x 3 Skin: No rashes, warm and dry, No obvious lesions  Psych: normal mood and behavior   Assessment/Plan Lung Nodules Recent CT scan in [place] revealed multiple lung nodules, largest being 3.5 cm with spiculated margins and satellite lesions.  No history of weight loss or hemoptysis.  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 Plan -Order PET scan to assess metabolic activity of the nodules. -Plan for biopsy if PET scan shows uptake.  Chronic Bronchitis History of cough with clear to yellow sputum, occasional gray. No fever. Recent history of bronchitis. Wheezing noted, especially at night. Exertional dyspnea.  Previous use of Trelegy and albuterol inhalers. Plan -Provide samples of Trelegy for daily use. -Prescribe albuterol inhaler for rescue use as needed. -Consider pulmonary function tests in the future to assess for COPD.  Follow-up  1 week after PET scan to discuss results  and determine next best steps in plan of care.   I spent 35 minutes dedicated to the care of this patient on the date of this encounter to include pre-visit review  of records, face-to-face time with the patient discussing conditions above, post visit ordering of testing, clinical documentation with the electronic health record, making appropriate referrals as documented, and communicating necessary information to the patient's healthcare team.   Bevelyn Ngo, NP 10/16/2023  9:53 AM

## 2023-10-19 ENCOUNTER — Encounter (HOSPITAL_COMMUNITY)
Admission: RE | Admit: 2023-10-19 | Discharge: 2023-10-19 | Disposition: A | Discharge status: Home / Self Care | Source: Ambulatory Visit | Attending: Acute Care | Admitting: Acute Care

## 2023-10-19 DIAGNOSIS — R911 Solitary pulmonary nodule: Secondary | ICD-10-CM | POA: Insufficient documentation

## 2023-10-19 MED ORDER — FLUDEOXYGLUCOSE F - 18 (FDG) INJECTION
13.5900 | Freq: Once | INTRAVENOUS | Status: AC | PRN
  Administered 2023-10-19: 13.59 via INTRAVENOUS

## 2023-10-24 ENCOUNTER — Other Ambulatory Visit (HOSPITAL_BASED_OUTPATIENT_CLINIC_OR_DEPARTMENT_OTHER): Payer: Self-pay

## 2023-10-24 DIAGNOSIS — R0609 Other forms of dyspnea: Secondary | ICD-10-CM

## 2023-10-26 ENCOUNTER — Ambulatory Visit (HOSPITAL_BASED_OUTPATIENT_CLINIC_OR_DEPARTMENT_OTHER): Admitting: Emergency Medicine

## 2023-10-26 DIAGNOSIS — R0609 Other forms of dyspnea: Secondary | ICD-10-CM

## 2023-10-26 LAB — PULMONARY FUNCTION TEST
DL/VA % pred: 118 %
DL/VA: 4.75 ml/min/mmHg/L
DLCO cor % pred: 114 %
DLCO cor: 32.76 ml/min/mmHg
DLCO unc % pred: 114 %
DLCO unc: 32.76 ml/min/mmHg
FEF 25-75 Post: 1.37 L/s
FEF 25-75 Pre: 1.43 L/s
FEF2575-%Change-Post: -3 %
FEF2575-%Pred-Post: 48 %
FEF2575-%Pred-Pre: 50 %
FEV1-%Change-Post: 0 %
FEV1-%Pred-Post: 69 %
FEV1-%Pred-Pre: 69 %
FEV1-Post: 2.54 L
FEV1-Pre: 2.55 L
FEV1FVC-%Change-Post: -1 %
FEV1FVC-%Pred-Pre: 87 %
FEV6-%Change-Post: 1 %
FEV6-%Pred-Post: 83 %
FEV6-%Pred-Pre: 82 %
FEV6-Post: 3.92 L
FEV6-Pre: 3.87 L
FEV6FVC-%Change-Post: -1 %
FEV6FVC-%Pred-Post: 102 %
FEV6FVC-%Pred-Pre: 104 %
FVC-%Change-Post: 1 %
FVC-%Pred-Post: 80 %
FVC-%Pred-Pre: 79 %
FVC-Post: 4.02 L
FVC-Pre: 3.96 L
Post FEV1/FVC ratio: 63 %
Post FEV6/FVC ratio: 98 %
Pre FEV1/FVC ratio: 64 %
Pre FEV6/FVC Ratio: 99 %
RV % pred: 130 %
RV: 3.4 L
TLC % pred: 100 %
TLC: 7.65 L

## 2023-10-26 NOTE — Patient Instructions (Signed)
 Full PFT Performed Today

## 2023-10-26 NOTE — Progress Notes (Signed)
 Full PFT Performed Today

## 2023-11-10 ENCOUNTER — Encounter: Payer: Self-pay | Admitting: Emergency Medicine

## 2023-11-10 ENCOUNTER — Ambulatory Visit (INDEPENDENT_AMBULATORY_CARE_PROVIDER_SITE_OTHER): Admitting: Acute Care

## 2023-11-10 ENCOUNTER — Encounter: Payer: Self-pay | Admitting: Acute Care

## 2023-11-10 VITALS — BP 125/82 | HR 75 | Ht 74.0 in | Wt 269.8 lb

## 2023-11-10 DIAGNOSIS — J449 Chronic obstructive pulmonary disease, unspecified: Secondary | ICD-10-CM

## 2023-11-10 DIAGNOSIS — Z87891 Personal history of nicotine dependence: Secondary | ICD-10-CM

## 2023-11-10 DIAGNOSIS — R918 Other nonspecific abnormal finding of lung field: Secondary | ICD-10-CM | POA: Diagnosis not present

## 2023-11-10 MED ORDER — TRELEGY ELLIPTA 100-62.5-25 MCG/ACT IN AEPB: 1.0000 | INHALATION_SPRAY | Freq: Every day | RESPIRATORY_TRACT | Status: DC

## 2023-11-10 MED ORDER — TRELEGY ELLIPTA 100-62.5-25 MCG/ACT IN AEPB: 1.0000 | INHALATION_SPRAY | Freq: Every day | RESPIRATORY_TRACT | 6 refills | Status: DC

## 2023-11-10 NOTE — Progress Notes (Signed)
 History of Present Illness Jose Joseph is a 68 y.o. male former smoker ( Quit 2013 with a 42 pack year smoking history referred for lung nodule consult 10/16/2023. He will be followed by Dr. Delton Coombes.   Synopsis Jose Joseph is a 68 year old male former smoker who presents with lung nodules found on imaging 07/2023.Marland Kitchen He is accompanied by his wife, who assisted with translation of Spanish imaging report, as we have been unable to get the CD images to open in the office today.   Lung nodules were identified on imaging performed in [place] 07/2023 , and show a significant right lung lesion in the 4.3 x 3.5 cm, described as spiculated with satellite lesions.  Additional smaller nodules are present in both lungs.   Pt. has been experiencing respiratory symptoms since January 2025, starting with a cold that progressed to involve his lungs. Initially, he had a slight fever that resolved. He describes a persistent cough with sputum that is clear to yellow, occasionally gray, but no hemoptysis. He experiences shortness of breath with exertion and wheezing, particularly at night. He was given  Trelegy and a nasal spray in for sinus issues in [place],  which he found helpful. He has not used the  albuterol inhaler  that was also provided at that time, but has it available for shortness of breath. He denies any  significant weight loss or hemoptysis.   He has a history of smoking, having quit in 2013 after smoking approximately a pack a day since age 81. 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   His family history is significant for various cancers, including lung cancer in an uncle, prostate cancer in a brother, kidney cancer in a sister, pancreatic cancer in another brother, and an unspecified cancer in a paternal aunt.   As we were unable to down load images of the CT Chest  done in [place] plan was a PET scan to better evaluate the nodule of concern. If PET is abnormal we will obtain a Super D Ct Chest for navigational bronchoscopy with biopsies.    11/11/2023 Pt presents for follow up after PET scan.  He states he has been doing well.  No respiratory issues other than his baseline shortness of breath. I have reviewed the PET scan results with the patient and his wife.  The PET scan shows multiple hypermetabolic pulmonary nodules bilaterally the largest is a spiculated nodule in the right upper lobe which is suspected to be the primary malignancy.  There are also hypermetabolic mediastinal and hilar lymph nodes consistent with metastatic disease.  There are hypermetabolic osseous lesions involving the sternum, left sixth rib, and right iliac bone.  These are also consistent with metastatic disease.  There is no evidence of metastatic disease in the abdomen or the pelvis. As we had discussed at the initial consult earlier in March the plan is going to be to obtain a super D CT of the chest to support navigational bronchoscopy , and EBUS with biopsies .  Both the patient and his wife are in agreement with this plan.  They are both understandably very upset about the PET scan findings.  They would prefer to get bronchoscopy with biopsies done as soon as possible as they are anticipating he will need treatment.   Test Results: PET scan done 10/19/2023, read  11/09/2023 Findings are most consistent with metastatic lung cancer. There are multiple hypermetabolic pulmonary nodules bilaterally, largest a spiculated nodule inferiorly in the right upper lobe which likely represents the primary malignancy. Hypermetabolic mediastinal and hilar lymph nodes bilaterally, consistent with metastatic disease. Hypermetabolic osseous lesions involving the sternum, left 6th rib and right iliac bone, consistent with metastatic disease. No evidence of metastatic disease in the abdomen or  pelvis. Aortic Atherosclerosis (ICD10-I70.0) and Emphysema (ICD10-J43.9).    Interpretation of Spanish transcription of CT Chest 07/2023 Per wife's interpretation of the Spanish written report, there is a  right lung lesion measuring  4.3 x 3.5 cm, described as spiculated with satellite lesions.  PFT's 10/26/2023               Latest Ref Rng & Units 01/16/2021    6:23 AM 01/12/2021    1:06 PM 01/08/2009    2:49 PM  CBC  WBC 4.0 - 10.5 K/uL 12.5  8.8  10.5   Hemoglobin 13.0 - 17.0 g/dL 04.5  40.9  81.1   Hematocrit 39.0 - 52.0 % 34.7  46.7  48.3   Platelets 150 - 400 K/uL 174  221  229        Latest Ref Rng & Units 01/16/2021    6:23 AM 01/12/2021    1:06 PM 01/08/2009    2:49 PM  BMP  Glucose 70 - 99 mg/dL 914  95  89   BUN 8 - 23 mg/dL 13  17  10    Creatinine 0.61 - 1.24 mg/dL 7.82  9.56  2.13   Sodium 135 - 145 mmol/L 135  138  138   Potassium 3.5 - 5.1 mmol/L 3.4  4.2  3.9   Chloride 98 - 111 mmol/L 103  102  101   CO2 22 - 32 mmol/L 24  26  29    Calcium 8.9 - 10.3 mg/dL 8.4  9.5  9.7     BNP No results found for: "BNP"  ProBNP No results found for: "PROBNP"  PFT    Component Value Date/Time   FEV1PRE 2.55 10/26/2023 0821   FEV1POST 2.54 10/26/2023 0821   FVCPRE 3.96 10/26/2023 0821   FVCPOST 4.02 10/26/2023 0821   TLC 7.65 10/26/2023 0821   DLCOUNC 32.76 10/26/2023 0821   PREFEV1FVCRT 64 10/26/2023 0821   PSTFEV1FVCRT 63 10/26/2023 0821    NM PET Image Initial (PI) Skull Base To Thigh Result Date: 11/09/2023 CLINICAL DATA:  Initial treatment strategy for right lung nodule. EXAM: NUCLEAR MEDICINE PET SKULL BASE TO THIGH TECHNIQUE: 13.59 mCi F-18 FDG was injected intravenously. Full-ring PET imaging was performed from the skull base to thigh after the radiotracer. CT data was obtained and used for attenuation correction and anatomic localization. Fasting blood glucose: 89 mg/dl COMPARISON:  No recent imaging available. Per office notes of 10/16/2023,  patient had an outside CT in [place] which demonstrated pulmonary nodules measuring up to 4.3 x 3.5 cm on the right. Abdominopelvic CT 04/03/2020 is available. FINDINGS: Mediastinal blood pool activity: SUV max 1.9 NECK: Collection of small hypermetabolic right supraclavicular lymph nodes measuring up to 1.0 cm short axis on image 49/202 (SUV max 10.1). No other hypermetabolic cervical lymph nodes are identified. Nonspecific prominent hypermetabolic activity within the lymphoid tissue of Waldeyer's ring (SUV max 4.9 on the right). No other suspicious activity identified within the pharyngeal mucosal space. Incidental CT findings: none CHEST: There are hypermetabolic mediastinal and hilar lymph nodes bilaterally. Low right paratracheal node measuring 1.6 cm  short axis on image 63/202 has an SUV max of 10.8. Right hilar nodes have an SUV max of 9.1, and left hilar nodes have an SUV max of 10.3. No axillary nodal involvement. There are multiple hypermetabolic pulmonary nodules bilaterally, largest a spiculated nodule inferiorly in the right upper lobe measuring 3.2 x 3.0 cm on image 28/203 (SUV max 10.3). There are other smaller nodules bilaterally, some of which demonstrate hypermetabolic activity for size. Incidental CT findings: Mild emphysema and central airway thickening. Mild aortic and coronary artery atherosclerosis. No pleural effusion. ABDOMEN/PELVIS: There is no hypermetabolic activity within the liver, adrenal glands, spleen or pancreas. There is no hypermetabolic nodal activity in the abdomen or pelvis. Incidental CT findings: Bilateral renal cysts for which no specific follow-up imaging is recommended. Aortic and branch vessel atherosclerosis, prostatomegaly and distal colonic diverticulosis. SKELETON: Hypermetabolic osseous lesions consistent with metastatic disease, involving the inferior sternum (SUV max 12.2), left 6th rib (SUV max 5.5) and right iliac bone (SUV max 9.5). No definite involvement of the  spine. Probable degenerative activity at the symphysis pubis. Incidental CT findings: Previous left total hip arthroplasty. Multilevel spondylosis. Activity within the right arm, presumably related to the injection. IMPRESSION: 1. Findings are most consistent with metastatic lung cancer. There are multiple hypermetabolic pulmonary nodules bilaterally, largest a spiculated nodule inferiorly in the right upper lobe which likely represents the primary malignancy. 2. Hypermetabolic mediastinal and hilar lymph nodes bilaterally, consistent with metastatic disease. 3. Hypermetabolic osseous lesions involving the sternum, left 6th rib and right iliac bone, consistent with metastatic disease. 4. No evidence of metastatic disease in the abdomen or pelvis. 5. Aortic Atherosclerosis (ICD10-I70.0) and Emphysema (ICD10-J43.9). Electronically Signed   By: Carey Bullocks M.D.   On: 11/09/2023 10:58     Past medical hx Past Medical History:  Diagnosis Date   Arthritis    Depression    Dyspnea    when he was smoking   History of kidney stones    2021   Pneumonia    during COVID in 2021     Social History   Tobacco Use   Smoking status: Former    Current packs/day: 0.00    Average packs/day: 1 pack/day for 35.0 years (35.0 ttl pk-yrs)    Types: Cigarettes    Start date: 48    Quit date: 2014    Years since quitting: 11.2    Passive exposure: Past   Smokeless tobacco: Never  Substance Use Topics   Alcohol use: Yes    Alcohol/week: 2.0 standard drinks of alcohol    Types: 2 Cans of beer per week   Drug use: Yes    Types: Marijuana    Mr.Sweeney reports that he quit smoking about 11 years ago. His smoking use included cigarettes. He started smoking about 46 years ago. He has a 35 pack-year smoking history. He has been exposed to tobacco smoke. He has never used smokeless tobacco. He reports current alcohol use of about 2.0 standard drinks of alcohol per week. He reports current drug use. Drug:  Marijuana.  Tobacco Cessation: Patient is a former smoker, quit in 2013 with a 42-pack-year smoking history   Past surgical hx, Family hx, Social hx all reviewed.  Current Outpatient Medications on File Prior to Visit  Medication Sig   albuterol (VENTOLIN HFA) 108 (90 Base) MCG/ACT inhaler Inhale 2 puffs into the lungs every 6 (six) hours as needed for wheezing or shortness of breath.   cetirizine (ZYRTEC) 10 MG tablet Take  10 mg by mouth in the morning.   citalopram (CELEXA) 20 MG tablet Take 20 mg by mouth in the morning.   fexofenadine (ALLEGRA) 180 MG tablet Take 180 mg by mouth in the morning.   No current facility-administered medications on file prior to visit.     Allergies  Allergen Reactions   Bee Venom Anaphylaxis    syncope    Review Of Systems:  Constitutional:   No  weight loss, night sweats,  Fevers, chills, fatigue, or  lassitude.  HEENT:   No headaches,  Difficulty swallowing,  Tooth/dental problems, or  Sore throat,                No sneezing, itching, ear ache, nasal congestion, post nasal drip,   CV:  No chest pain,  Orthopnea, PND, swelling in lower extremities, anasarca, dizziness, palpitations, syncope.   GI  No heartburn, indigestion, abdominal pain, nausea, vomiting, diarrhea, change in bowel habits, loss of appetite, bloody stools.   Resp: + shortness of breath with exertion less at rest.  No excess mucus, no productive cough,  No non-productive cough,  No coughing up of blood.  No change in color of mucus.  No wheezing.  No chest wall deformity  Skin: no rash or lesions.  GU: no dysuria, change in color of urine, no urgency or frequency.  No flank pain, no hematuria   MS:  No joint pain or swelling.  No decreased range of motion.  No back pain, + left side pain  Psych:  No change in mood or affect. No depression or anxiety.  No memory loss.   Vital Signs BP 125/82 (BP Location: Left Arm, Patient Position: Sitting, Cuff Size: Large)   Pulse 75    Ht 6\' 2"  (1.88 m)   Wt 269 lb 12.8 oz (122.4 kg)   SpO2 96%   BMI 34.64 kg/m    Physical Exam:  General- No distress,  A&Ox3, pleasant and appropriate ENT: No sinus tenderness, TM clear, pale nasal mucosa, no oral exudate,no post nasal drip, no LAN Cardiac: S1, S2, regular rate and rhythm, no murmur Chest: No wheeze/ rales/ dullness; no accessory muscle use, no nasal flaring, no sternal retractions Abd.: Soft Non-tender, nondistended, bowel sounds positive,Body mass index is 34.64 kg/m.  Ext: No clubbing cyanosis, edema, no obvious deformities Neuro:  normal strength, moving all extremities x 4, alert and oriented x 3, appropriate Skin: No rashes, warm and dry, no obvious lesions Psych: normal mood and behavior, appropriately concerned about PET scan results   Assessment/Plan Incidental finding of right upper lobe pulmonary nodule 08/04/2023 Former smoker with a 42-pack-year smoking history PET scan consistent with a primary right upper lobe lung cancer with bilateral metastasis to and other pulmonary nodules. Suspected metastasis to multiple lymph nodes, as well as  osseous lesions suspicious for metastatic disease. COPD per PFT results Plan Your PET scan does show that the nodule of concern, and several lymph nodes   show increased metabolic activity. Next best step is for a bronchoscopy  with biopsies to better evaluate this pulmonary nodule and lymph nodes. I have ordered a Super D CT Chest before the procedure to help with planning the bronchoscopy with biopsies. You will get a call to get this scheduled.  This will be done by Dr. Levy Pupa. This is a procedure that is done at [facility]. We have gone over the risks that include bleeding, infection, punctured lung, and adverse reaction to anesthesia. You are in  agreement with moving forward with the procedure. You will need someone to drive you to the hospital, stay during the procedure, and drive you  home. You will also need someone to stay with you for 24 hours after the procedure while you are recovering from anesthesia. You will follow-up with me 1 week later in the office to review the pathology results. I have sent in a prescription for Trelegy. Use 1 puff once daily, rinse mouth after use. Use Albuterol inhaler 30 minutes prior to taking Trelegy to ensure deep breath. Please call if you have any questions or concerns. Please contact office for sooner follow up if symptoms do not improve or worsen or seek emergency care    I spent 45 minutes dedicated to the care of this patient on the date of this encounter to include pre-visit review of records, face-to-face time with the patient discussing conditions above, post visit ordering of testing, clinical documentation with the electronic health record, making appropriate referrals as documented, and communicating necessary information to the patient's healthcare team.      Bevelyn Ngo, NP 11/11/2023  6:57 PM

## 2023-11-10 NOTE — Patient Instructions (Signed)
 Your PET scan does show that the nodule of concern, and several lymph nodes   show increased metabolic activity. Next best step is for a bronchoscopy  with biopsies to better evaluate this pulmonary nodule and lymph nodes. I have ordered a Super D CT Chest before the procedure to help with planning the bronchoscopy with biopsies. You will get a call to get this scheduled.  This will be done by Dr. Levy Pupa. This is a procedure that is done at [facility]. We have gone over the risks that include bleeding, infection, punctured lung, and adverse reaction to anesthesia. You are in agreement with moving forward with the procedure. You will need someone to drive you to the hospital, stay during the procedure, and drive you home. You will also need someone to stay with you for 24 hours after the procedure while you are recovering from anesthesia. You will follow-up with me 1 week later in the office to review the pathology results. I have sent in a prescription for Trelegy. Use 1 puff once daily, rinse mouth after use. Use Albuterol inhaler 30 minutes prior to taking Trelegy to ensure deep breath. Please call if you have any questions or concerns. Please contact office for sooner follow up if symptoms do not improve or worsen or seek emergency care

## 2023-11-10 NOTE — H&P (View-Only) (Signed)
 History of Present Illness Jose Joseph is a 68 y.o. male former smoker ( Quit 2013 with a 42 pack year smoking history referred for lung nodule consult 10/16/2023. He will be followed by Dr. Delton Coombes.   Synopsis Jose Joseph is a 68 year old male former smoker who presents with lung nodules found on imaging 07/2023.Marland Kitchen He is accompanied by his wife, who assisted with translation of Spanish imaging report, as we have been unable to get the CD images to open in the office today.   Lung nodules were identified on imaging performed in [place] 07/2023 , and show a significant right lung lesion in the 4.3 x 3.5 cm, described as spiculated with satellite lesions.  Additional smaller nodules are present in both lungs.   Pt. has been experiencing respiratory symptoms since January 2025, starting with a cold that progressed to involve his lungs. Initially, he had a slight fever that resolved. He describes a persistent cough with sputum that is clear to yellow, occasionally gray, but no hemoptysis. He experiences shortness of breath with exertion and wheezing, particularly at night. He was given  Trelegy and a nasal spray in for sinus issues in [place],  which he found helpful. He has not used the  albuterol inhaler  that was also provided at that time, but has it available for shortness of breath. He denies any  significant weight loss or hemoptysis.   He has a history of smoking, having quit in 2013 after smoking approximately a pack a day since age 81. 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   His family history is significant for various cancers, including lung cancer in an uncle, prostate cancer in a brother, kidney cancer in a sister, pancreatic cancer in another brother, and an unspecified cancer in a paternal aunt.   As we were unable to down load images of the CT Chest  done in [place] plan was a PET scan to better evaluate the nodule of concern. If PET is abnormal we will obtain a Super D Ct Chest for navigational bronchoscopy with biopsies.    11/11/2023 Pt presents for follow up after PET scan.  He states he has been doing well.  No respiratory issues other than his baseline shortness of breath. I have reviewed the PET scan results with the patient and his wife.  The PET scan shows multiple hypermetabolic pulmonary nodules bilaterally the largest is a spiculated nodule in the right upper lobe which is suspected to be the primary malignancy.  There are also hypermetabolic mediastinal and hilar lymph nodes consistent with metastatic disease.  There are hypermetabolic osseous lesions involving the sternum, left sixth rib, and right iliac bone.  These are also consistent with metastatic disease.  There is no evidence of metastatic disease in the abdomen or the pelvis. As we had discussed at the initial consult earlier in March the plan is going to be to obtain a super D CT of the chest to support navigational bronchoscopy , and EBUS with biopsies .  Both the patient and his wife are in agreement with this plan.  They are both understandably very upset about the PET scan findings.  They would prefer to get bronchoscopy with biopsies done as soon as possible as they are anticipating he will need treatment.   Test Results: PET scan done 10/19/2023, read  11/09/2023 Findings are most consistent with metastatic lung cancer. There are multiple hypermetabolic pulmonary nodules bilaterally, largest a spiculated nodule inferiorly in the right upper lobe which likely represents the primary malignancy. Hypermetabolic mediastinal and hilar lymph nodes bilaterally, consistent with metastatic disease. Hypermetabolic osseous lesions involving the sternum, left 6th rib and right iliac bone, consistent with metastatic disease. No evidence of metastatic disease in the abdomen or  pelvis. Aortic Atherosclerosis (ICD10-I70.0) and Emphysema (ICD10-J43.9).    Interpretation of Spanish transcription of CT Chest 07/2023 Per wife's interpretation of the Spanish written report, there is a  right lung lesion measuring  4.3 x 3.5 cm, described as spiculated with satellite lesions.  PFT's 10/26/2023               Latest Ref Rng & Units 01/16/2021    6:23 AM 01/12/2021    1:06 PM 01/08/2009    2:49 PM  CBC  WBC 4.0 - 10.5 K/uL 12.5  8.8  10.5   Hemoglobin 13.0 - 17.0 g/dL 04.5  40.9  81.1   Hematocrit 39.0 - 52.0 % 34.7  46.7  48.3   Platelets 150 - 400 K/uL 174  221  229        Latest Ref Rng & Units 01/16/2021    6:23 AM 01/12/2021    1:06 PM 01/08/2009    2:49 PM  BMP  Glucose 70 - 99 mg/dL 914  95  89   BUN 8 - 23 mg/dL 13  17  10    Creatinine 0.61 - 1.24 mg/dL 7.82  9.56  2.13   Sodium 135 - 145 mmol/L 135  138  138   Potassium 3.5 - 5.1 mmol/L 3.4  4.2  3.9   Chloride 98 - 111 mmol/L 103  102  101   CO2 22 - 32 mmol/L 24  26  29    Calcium 8.9 - 10.3 mg/dL 8.4  9.5  9.7     BNP No results found for: "BNP"  ProBNP No results found for: "PROBNP"  PFT    Component Value Date/Time   FEV1PRE 2.55 10/26/2023 0821   FEV1POST 2.54 10/26/2023 0821   FVCPRE 3.96 10/26/2023 0821   FVCPOST 4.02 10/26/2023 0821   TLC 7.65 10/26/2023 0821   DLCOUNC 32.76 10/26/2023 0821   PREFEV1FVCRT 64 10/26/2023 0821   PSTFEV1FVCRT 63 10/26/2023 0821    NM PET Image Initial (PI) Skull Base To Thigh Result Date: 11/09/2023 CLINICAL DATA:  Initial treatment strategy for right lung nodule. EXAM: NUCLEAR MEDICINE PET SKULL BASE TO THIGH TECHNIQUE: 13.59 mCi F-18 FDG was injected intravenously. Full-ring PET imaging was performed from the skull base to thigh after the radiotracer. CT data was obtained and used for attenuation correction and anatomic localization. Fasting blood glucose: 89 mg/dl COMPARISON:  No recent imaging available. Per office notes of 10/16/2023,  patient had an outside CT in [place] which demonstrated pulmonary nodules measuring up to 4.3 x 3.5 cm on the right. Abdominopelvic CT 04/03/2020 is available. FINDINGS: Mediastinal blood pool activity: SUV max 1.9 NECK: Collection of small hypermetabolic right supraclavicular lymph nodes measuring up to 1.0 cm short axis on image 49/202 (SUV max 10.1). No other hypermetabolic cervical lymph nodes are identified. Nonspecific prominent hypermetabolic activity within the lymphoid tissue of Waldeyer's ring (SUV max 4.9 on the right). No other suspicious activity identified within the pharyngeal mucosal space. Incidental CT findings: none CHEST: There are hypermetabolic mediastinal and hilar lymph nodes bilaterally. Low right paratracheal node measuring 1.6 cm  short axis on image 63/202 has an SUV max of 10.8. Right hilar nodes have an SUV max of 9.1, and left hilar nodes have an SUV max of 10.3. No axillary nodal involvement. There are multiple hypermetabolic pulmonary nodules bilaterally, largest a spiculated nodule inferiorly in the right upper lobe measuring 3.2 x 3.0 cm on image 28/203 (SUV max 10.3). There are other smaller nodules bilaterally, some of which demonstrate hypermetabolic activity for size. Incidental CT findings: Mild emphysema and central airway thickening. Mild aortic and coronary artery atherosclerosis. No pleural effusion. ABDOMEN/PELVIS: There is no hypermetabolic activity within the liver, adrenal glands, spleen or pancreas. There is no hypermetabolic nodal activity in the abdomen or pelvis. Incidental CT findings: Bilateral renal cysts for which no specific follow-up imaging is recommended. Aortic and branch vessel atherosclerosis, prostatomegaly and distal colonic diverticulosis. SKELETON: Hypermetabolic osseous lesions consistent with metastatic disease, involving the inferior sternum (SUV max 12.2), left 6th rib (SUV max 5.5) and right iliac bone (SUV max 9.5). No definite involvement of the  spine. Probable degenerative activity at the symphysis pubis. Incidental CT findings: Previous left total hip arthroplasty. Multilevel spondylosis. Activity within the right arm, presumably related to the injection. IMPRESSION: 1. Findings are most consistent with metastatic lung cancer. There are multiple hypermetabolic pulmonary nodules bilaterally, largest a spiculated nodule inferiorly in the right upper lobe which likely represents the primary malignancy. 2. Hypermetabolic mediastinal and hilar lymph nodes bilaterally, consistent with metastatic disease. 3. Hypermetabolic osseous lesions involving the sternum, left 6th rib and right iliac bone, consistent with metastatic disease. 4. No evidence of metastatic disease in the abdomen or pelvis. 5. Aortic Atherosclerosis (ICD10-I70.0) and Emphysema (ICD10-J43.9). Electronically Signed   By: Carey Bullocks M.D.   On: 11/09/2023 10:58     Past medical hx Past Medical History:  Diagnosis Date   Arthritis    Depression    Dyspnea    when he was smoking   History of kidney stones    2021   Pneumonia    during COVID in 2021     Social History   Tobacco Use   Smoking status: Former    Current packs/day: 0.00    Average packs/day: 1 pack/day for 35.0 years (35.0 ttl pk-yrs)    Types: Cigarettes    Start date: 48    Quit date: 2014    Years since quitting: 11.2    Passive exposure: Past   Smokeless tobacco: Never  Substance Use Topics   Alcohol use: Yes    Alcohol/week: 2.0 standard drinks of alcohol    Types: 2 Cans of beer per week   Drug use: Yes    Types: Marijuana    Mr.Sweeney reports that he quit smoking about 11 years ago. His smoking use included cigarettes. He started smoking about 46 years ago. He has a 35 pack-year smoking history. He has been exposed to tobacco smoke. He has never used smokeless tobacco. He reports current alcohol use of about 2.0 standard drinks of alcohol per week. He reports current drug use. Drug:  Marijuana.  Tobacco Cessation: Patient is a former smoker, quit in 2013 with a 42-pack-year smoking history   Past surgical hx, Family hx, Social hx all reviewed.  Current Outpatient Medications on File Prior to Visit  Medication Sig   albuterol (VENTOLIN HFA) 108 (90 Base) MCG/ACT inhaler Inhale 2 puffs into the lungs every 6 (six) hours as needed for wheezing or shortness of breath.   cetirizine (ZYRTEC) 10 MG tablet Take  10 mg by mouth in the morning.   citalopram (CELEXA) 20 MG tablet Take 20 mg by mouth in the morning.   fexofenadine (ALLEGRA) 180 MG tablet Take 180 mg by mouth in the morning.   No current facility-administered medications on file prior to visit.     Allergies  Allergen Reactions   Bee Venom Anaphylaxis    syncope    Review Of Systems:  Constitutional:   No  weight loss, night sweats,  Fevers, chills, fatigue, or  lassitude.  HEENT:   No headaches,  Difficulty swallowing,  Tooth/dental problems, or  Sore throat,                No sneezing, itching, ear ache, nasal congestion, post nasal drip,   CV:  No chest pain,  Orthopnea, PND, swelling in lower extremities, anasarca, dizziness, palpitations, syncope.   GI  No heartburn, indigestion, abdominal pain, nausea, vomiting, diarrhea, change in bowel habits, loss of appetite, bloody stools.   Resp: + shortness of breath with exertion less at rest.  No excess mucus, no productive cough,  No non-productive cough,  No coughing up of blood.  No change in color of mucus.  No wheezing.  No chest wall deformity  Skin: no rash or lesions.  GU: no dysuria, change in color of urine, no urgency or frequency.  No flank pain, no hematuria   MS:  No joint pain or swelling.  No decreased range of motion.  No back pain, + left side pain  Psych:  No change in mood or affect. No depression or anxiety.  No memory loss.   Vital Signs BP 125/82 (BP Location: Left Arm, Patient Position: Sitting, Cuff Size: Large)   Pulse 75    Ht 6\' 2"  (1.88 m)   Wt 269 lb 12.8 oz (122.4 kg)   SpO2 96%   BMI 34.64 kg/m    Physical Exam:  General- No distress,  A&Ox3, pleasant and appropriate ENT: No sinus tenderness, TM clear, pale nasal mucosa, no oral exudate,no post nasal drip, no LAN Cardiac: S1, S2, regular rate and rhythm, no murmur Chest: No wheeze/ rales/ dullness; no accessory muscle use, no nasal flaring, no sternal retractions Abd.: Soft Non-tender, nondistended, bowel sounds positive,Body mass index is 34.64 kg/m.  Ext: No clubbing cyanosis, edema, no obvious deformities Neuro:  normal strength, moving all extremities x 4, alert and oriented x 3, appropriate Skin: No rashes, warm and dry, no obvious lesions Psych: normal mood and behavior, appropriately concerned about PET scan results   Assessment/Plan Incidental finding of right upper lobe pulmonary nodule 08/04/2023 Former smoker with a 42-pack-year smoking history PET scan consistent with a primary right upper lobe lung cancer with bilateral metastasis to and other pulmonary nodules. Suspected metastasis to multiple lymph nodes, as well as  osseous lesions suspicious for metastatic disease. COPD per PFT results Plan Your PET scan does show that the nodule of concern, and several lymph nodes   show increased metabolic activity. Next best step is for a bronchoscopy  with biopsies to better evaluate this pulmonary nodule and lymph nodes. I have ordered a Super D CT Chest before the procedure to help with planning the bronchoscopy with biopsies. You will get a call to get this scheduled.  This will be done by Dr. Levy Pupa. This is a procedure that is done at [facility]. We have gone over the risks that include bleeding, infection, punctured lung, and adverse reaction to anesthesia. You are in  agreement with moving forward with the procedure. You will need someone to drive you to the hospital, stay during the procedure, and drive you  home. You will also need someone to stay with you for 24 hours after the procedure while you are recovering from anesthesia. You will follow-up with me 1 week later in the office to review the pathology results. I have sent in a prescription for Trelegy. Use 1 puff once daily, rinse mouth after use. Use Albuterol inhaler 30 minutes prior to taking Trelegy to ensure deep breath. Please call if you have any questions or concerns. Please contact office for sooner follow up if symptoms do not improve or worsen or seek emergency care    I spent 45 minutes dedicated to the care of this patient on the date of this encounter to include pre-visit review of records, face-to-face time with the patient discussing conditions above, post visit ordering of testing, clinical documentation with the electronic health record, making appropriate referrals as documented, and communicating necessary information to the patient's healthcare team.      Bevelyn Ngo, NP 11/11/2023  6:57 PM

## 2023-11-11 ENCOUNTER — Encounter: Payer: Self-pay | Admitting: Acute Care

## 2023-11-17 ENCOUNTER — Ambulatory Visit (HOSPITAL_COMMUNITY)
Admission: RE | Admit: 2023-11-17 | Discharge: 2023-11-17 | Disposition: A | Discharge status: Home / Self Care | Source: Ambulatory Visit | Attending: Acute Care | Admitting: Acute Care

## 2023-11-17 DIAGNOSIS — R918 Other nonspecific abnormal finding of lung field: Secondary | ICD-10-CM | POA: Insufficient documentation

## 2023-11-20 ENCOUNTER — Other Ambulatory Visit: Payer: Self-pay

## 2023-11-20 ENCOUNTER — Encounter (HOSPITAL_COMMUNITY): Payer: Self-pay | Admitting: Emergency Medicine

## 2023-11-20 NOTE — Progress Notes (Signed)
 PCP - Carylon Perches, MD  Cardiologist -   PPM/ICD - denies Device Orders - n/a Rep Notified - n/a  Chest x-ray - Chest CT 11-17-23 EKG -  Stress Test -  ECHO -  Cardiac Cath -  PFT - 10-26-23  CPAP - denies  Dm -denies  Blood Thinner Instructions: denies Aspirin Instructions: n/a  ERAS Protcol - NPO  COVID TEST- n/a  Anesthesia review: No  Patient verbally denies any shortness of breath, fever, cough and chest pain during phone call   -------------  SDW INSTRUCTIONS given:  Your procedure is scheduled on November 21, 2023.  Report to [facility] Main Entrance "A" at 6:45 A.M., and check in at the Admitting office.  Call this number if you have problems the morning of surgery:  [phone]   Remember:  Do not eat or drink after midnight the night before your surgery      Take these medicines the morning of surgery with A SIP OF WATER  albuterol (VENTOLIN HFA) inhaler  cetirizine (ZYRTEC)  citalopram (CELEXA)  fexofenadine (ALLEGRA)  Fluticasone-Umeclidin-Vilant (TRELEGY ELLIPTA)    As of today, STOP taking any Aspirin (unless otherwise instructed by your surgeon) Aleve, Naproxen, Ibuprofen, Motrin, Advil, Goody's, BC's, all herbal medications, fish oil, and all vitamins.                      Do not wear jewelry, make up, or nail polish            Do not wear lotions, powders, perfumes/colognes, or deodorant.            Do not shave 48 hours prior to surgery.  Men may shave face and neck.            Do not bring valuables to the hospital.            [facility] is not responsible for any belongings or valuables.  Do NOT Smoke (Tobacco/Vaping) 24 hours prior to your procedure If you use a CPAP at night, you may bring all equipment for your overnight stay.   Contacts, glasses, dentures or bridgework may not be worn into surgery.      For patients admitted to the hospital, discharge time will be determined by your treatment team.   Patients discharged the day of  surgery will not be allowed to drive home, and someone needs to stay with them for 24 hours.    Special instructions:   [place]- Preparing For Surgery  Before surgery, you can play an important role. Because skin is not sterile, your skin needs to be as free of germs as possible. You can reduce the number of germs on your skin by washing with CHG (chlorahexidine gluconate) Soap before surgery.  CHG is an antiseptic cleaner which kills germs and bonds with the skin to continue killing germs even after washing.    Oral Hygiene is also important to reduce your risk of infection.  Remember - BRUSH YOUR TEETH THE MORNING OF SURGERY WITH YOUR REGULAR TOOTHPASTE  Please do not use if you have an allergy to CHG or antibacterial soaps. If your skin becomes reddened/irritated stop using the CHG.  Do not shave (including legs and underarms) for at least 48 hours prior to first CHG shower. It is OK to shave your face.  Please follow these instructions carefully.   Shower the NIGHT BEFORE SURGERY and the MORNING OF SURGERY with DIAL Soap.   Pat yourself dry with  a CLEAN TOWEL.  Wear CLEAN PAJAMAS to bed the night before surgery  Place CLEAN SHEETS on your bed the night of your first shower and DO NOT SLEEP WITH PETS.   Day of Surgery: Please shower morning of surgery  Wear Clean/Comfortable clothing the morning of surgery Do not apply any deodorants/lotions.   Remember to brush your teeth WITH YOUR REGULAR TOOTHPASTE.   Questions were answered. Patient verbalized understanding of instructions.

## 2023-11-21 ENCOUNTER — Encounter (HOSPITAL_COMMUNITY): Payer: Self-pay | Admitting: Emergency Medicine

## 2023-11-21 ENCOUNTER — Other Ambulatory Visit: Payer: Self-pay

## 2023-11-21 ENCOUNTER — Encounter (HOSPITAL_COMMUNITY)
Admission: RE | Disposition: A | Discharge status: Home / Self Care | Payer: Self-pay | Source: Home / Self Care | Attending: Emergency Medicine

## 2023-11-21 ENCOUNTER — Ambulatory Visit (HOSPITAL_BASED_OUTPATIENT_CLINIC_OR_DEPARTMENT_OTHER): Admitting: Anesthesiology

## 2023-11-21 ENCOUNTER — Ambulatory Visit (HOSPITAL_COMMUNITY)

## 2023-11-21 ENCOUNTER — Ambulatory Visit (HOSPITAL_COMMUNITY)
Admission: RE | Admit: 2023-11-21 | Discharge: 2023-11-21 | Disposition: A | Discharge status: Home / Self Care | Attending: Emergency Medicine | Admitting: Emergency Medicine

## 2023-11-21 ENCOUNTER — Ambulatory Visit (HOSPITAL_COMMUNITY): Admitting: Anesthesiology

## 2023-11-21 DIAGNOSIS — Z800 Family history of malignant neoplasm of digestive organs: Secondary | ICD-10-CM | POA: Diagnosis not present

## 2023-11-21 DIAGNOSIS — R590 Localized enlarged lymph nodes: Secondary | ICD-10-CM

## 2023-11-21 DIAGNOSIS — Z87891 Personal history of nicotine dependence: Secondary | ICD-10-CM | POA: Insufficient documentation

## 2023-11-21 DIAGNOSIS — C3411 Malignant neoplasm of upper lobe, right bronchus or lung: Secondary | ICD-10-CM | POA: Diagnosis not present

## 2023-11-21 DIAGNOSIS — J45909 Unspecified asthma, uncomplicated: Secondary | ICD-10-CM | POA: Diagnosis not present

## 2023-11-21 DIAGNOSIS — R918 Other nonspecific abnormal finding of lung field: Secondary | ICD-10-CM

## 2023-11-21 DIAGNOSIS — Z7722 Contact with and (suspected) exposure to environmental tobacco smoke (acute) (chronic): Secondary | ICD-10-CM | POA: Diagnosis not present

## 2023-11-21 DIAGNOSIS — J439 Emphysema, unspecified: Secondary | ICD-10-CM | POA: Diagnosis not present

## 2023-11-21 DIAGNOSIS — Z8051 Family history of malignant neoplasm of kidney: Secondary | ICD-10-CM | POA: Diagnosis not present

## 2023-11-21 HISTORY — DX: Unspecified asthma, uncomplicated: J45.909

## 2023-11-21 HISTORY — DX: Other complications of anesthesia, initial encounter: T88.59XA

## 2023-11-21 LAB — CBC
HCT: 45.5 % (ref 39.0–52.0)
Hemoglobin: 15.5 g/dL (ref 13.0–17.0)
MCH: 30.9 pg (ref 26.0–34.0)
MCHC: 34.1 g/dL (ref 30.0–36.0)
MCV: 90.8 fL (ref 80.0–100.0)
Platelets: 215 10*3/uL (ref 150–400)
RBC: 5.01 MIL/uL (ref 4.22–5.81)
RDW: 12.6 % (ref 11.5–15.5)
WBC: 8.2 10*3/uL (ref 4.0–10.5)
nRBC: 0 % (ref 0.0–0.2)

## 2023-11-21 SURGERY — VIDEO BRONCHOSCOPY WITH ENDOBRONCHIAL NAVIGATION
Anesthesia: General | Laterality: Bilateral

## 2023-11-21 MED ORDER — LACTATED RINGERS IV SOLN: INTRAVENOUS | Status: DC

## 2023-11-21 MED ORDER — AMISULPRIDE (ANTIEMETIC) 5 MG/2ML IV SOLN: 10.0000 mg | Freq: Once | INTRAVENOUS | Status: DC | PRN

## 2023-11-21 MED ORDER — ROCURONIUM BROMIDE 10 MG/ML (PF) SYRINGE
PREFILLED_SYRINGE | INTRAVENOUS | Status: DC | PRN
  Administered 2023-11-21: 30 mg via INTRAVENOUS
  Administered 2023-11-21: 50 mg via INTRAVENOUS

## 2023-11-21 MED ORDER — DEXMEDETOMIDINE HCL IN NACL 80 MCG/20ML IV SOLN
INTRAVENOUS | Status: DC | PRN
  Administered 2023-11-21: 8 ug via INTRAVENOUS
  Administered 2023-11-21: 12 ug via INTRAVENOUS

## 2023-11-21 MED ORDER — DEXAMETHASONE SODIUM PHOSPHATE 10 MG/ML IJ SOLN
INTRAMUSCULAR | Status: DC | PRN
  Administered 2023-11-21: 10 mg via INTRAVENOUS

## 2023-11-21 MED ORDER — PROPOFOL 500 MG/50ML IV EMUL
INTRAVENOUS | Status: DC | PRN
  Administered 2023-11-21: 150 ug/kg/min via INTRAVENOUS

## 2023-11-21 MED ORDER — ACETAMINOPHEN 500 MG PO TABS
1000.0000 mg | ORAL_TABLET | Freq: Once | ORAL | Status: AC
  Administered 2023-11-21: 1000 mg via ORAL
  Filled 2023-11-21: qty 2

## 2023-11-21 MED ORDER — FENTANYL CITRATE (PF) 100 MCG/2ML IJ SOLN
INTRAMUSCULAR | Status: AC
  Filled 2023-11-21: qty 2

## 2023-11-21 MED ORDER — ONDANSETRON HCL 4 MG/2ML IJ SOLN
INTRAMUSCULAR | Status: DC | PRN
  Administered 2023-11-21: 4 mg via INTRAVENOUS

## 2023-11-21 MED ORDER — CHLORHEXIDINE GLUCONATE 0.12 % MT SOLN
OROMUCOSAL | Status: AC
  Administered 2023-11-21: 15 mL via OROMUCOSAL
  Filled 2023-11-21: qty 15

## 2023-11-21 MED ORDER — SUGAMMADEX SODIUM 200 MG/2ML IV SOLN
INTRAVENOUS | Status: DC | PRN
  Administered 2023-11-21: 200 mg via INTRAVENOUS

## 2023-11-21 MED ORDER — FENTANYL CITRATE (PF) 100 MCG/2ML IJ SOLN: 25.0000 ug | INTRAMUSCULAR | Status: DC | PRN

## 2023-11-21 MED ORDER — PHENYLEPHRINE HCL-NACL 20-0.9 MG/250ML-% IV SOLN
INTRAVENOUS | Status: DC | PRN
  Administered 2023-11-21: 20 ug/min via INTRAVENOUS

## 2023-11-21 MED ORDER — PHENYLEPHRINE 80 MCG/ML (10ML) SYRINGE FOR IV PUSH (FOR BLOOD PRESSURE SUPPORT)
PREFILLED_SYRINGE | INTRAVENOUS | Status: DC | PRN
  Administered 2023-11-21: 80 ug via INTRAVENOUS
  Administered 2023-11-21: 160 ug via INTRAVENOUS
  Administered 2023-11-21: 80 ug via INTRAVENOUS
  Administered 2023-11-21: 160 ug via INTRAVENOUS
  Administered 2023-11-21 (×2): 80 ug via INTRAVENOUS

## 2023-11-21 MED ORDER — LIDOCAINE 2% (20 MG/ML) 5 ML SYRINGE
INTRAMUSCULAR | Status: DC | PRN
  Administered 2023-11-21: 60 mg via INTRAVENOUS

## 2023-11-21 MED ORDER — CHLORHEXIDINE GLUCONATE 0.12 % MT SOLN: 15.0000 mL | Freq: Once | OROMUCOSAL | Status: AC

## 2023-11-21 MED ORDER — PROPOFOL 10 MG/ML IV BOLUS
INTRAVENOUS | Status: DC | PRN
  Administered 2023-11-21: 150 mg via INTRAVENOUS

## 2023-11-21 NOTE — Interval H&P Note (Signed)
 History and Physical Interval Note:  11/21/2023 7:39 AM  Jose Joseph  has presented today for surgery, with the diagnosis of RIGHT HILAR,LEFT HILAR, RIGHT UPPER LOBE LUNG NODULE WITH LYMPHODE INVOLVEMENT SUSPECT METS.  The various methods of treatment have been discussed with the patient and family. After consideration of risks, benefits and other options for treatment, the patient has consented to  Procedure(s): VIDEO BRONCHOSCOPY WITH ENDOBRONCHIAL NAVIGATION (Bilateral) ENDOBRONCHIAL ULTRASOUND (EBUS) (Bilateral) as a surgical intervention.  The patient's history has been reviewed, patient examined, no change in status, stable for surgery.  I have reviewed the patient's chart and labs.  Questions were answered to the patient's satisfaction.     [name]

## 2023-11-21 NOTE — Transfer of Care (Cosign Needed)
 Immediate Anesthesia Transfer of Care Note  Patient: Jose Joseph  Procedure(s) Performed: VIDEO BRONCHOSCOPY WITH ENDOBRONCHIAL NAVIGATION (Bilateral) ENDOBRONCHIAL ULTRASOUND (EBUS) (Bilateral)  Patient Location: PACU  Anesthesia Type:General  Level of Consciousness: awake, alert , oriented, and patient cooperative  Airway & Oxygen Therapy: Patient Spontanous Breathing and Patient connected to nasal cannula oxygen  Post-op Assessment: Report given to RN and Post -op Vital signs reviewed and stable  Post vital signs: Reviewed and stable  Last Vitals:  Vitals Value Taken Time  BP    Temp    Pulse 74 11/21/23 1049  Resp 15 11/21/23 1049  SpO2 91 % 11/21/23 1049  Vitals shown include unfiled device data.  Last Pain:  Vitals:   11/21/23 0754  TempSrc:   PainSc: 0-No pain         Complications: No notable events documented.

## 2023-11-21 NOTE — Anesthesia Preprocedure Evaluation (Addendum)
 Anesthesia Evaluation  Patient identified by MRN, date of birth, ID band Patient awake    Reviewed: Allergy & Precautions, NPO status , Patient's Chart, lab work & pertinent test results  Airway Mallampati: III  TM Distance: >3 FB Neck ROM: Full    Dental  (+) Dental Advisory Given   Pulmonary shortness of breath, asthma , former smoker   breath sounds clear to auscultation       Cardiovascular negative cardio ROS  Rhythm:Regular Rate:Normal     Neuro/Psych negative neurological ROS     GI/Hepatic negative GI ROS, Neg liver ROS,,,  Endo/Other  negative endocrine ROS    Renal/GU negative Renal ROS     Musculoskeletal  (+) Arthritis ,    Abdominal   Peds  Hematology negative hematology ROS (+)   Anesthesia Other Findings   Reproductive/Obstetrics                             Anesthesia Physical Anesthesia Plan  ASA: 3  Anesthesia Plan: General   Post-op Pain Management: Minimal or no pain anticipated   Induction: Intravenous  PONV Risk Score and Plan: 2 and Dexamethasone, Ondansetron and Treatment may vary due to age or medical condition  Airway Management Planned: Oral ETT  Additional Equipment: None  Intra-op Plan:   Post-operative Plan: Extubation in OR  Informed Consent: I have reviewed the patients History and Physical, chart, labs and discussed the procedure including the risks, benefits and alternatives for the proposed anesthesia with the patient or authorized representative who has indicated his/her understanding and acceptance.     Dental advisory given  Plan Discussed with: CRNA  Anesthesia Plan Comments:        Anesthesia Quick Evaluation

## 2023-11-21 NOTE — Op Note (Signed)
 Video Bronchoscopy with Robotic Assisted Bronchoscopic Navigation and Endobronchial Ultrasound Procedure Note  Date of Operation: 11/21/2023   Pre-op Diagnosis: bilateral pulmonary nodules, mediastinal adenopathy  Post-op Diagnosis: same  Surgeon: Levy Pupa  Assistants: none  Anesthesia: General endotracheal anesthesia  Operation: Flexible video fiberoptic bronchoscopy with robotic assistance and biopsies.  Estimated Blood Loss: Minimal  Complications: None  Indications and History: Jose Joseph is a 68 y.o. male with history of tobacco use. The risks, benefits, complications, treatment options and expected outcomes were discussed with the patient.  The possibilities of pneumothorax, pneumonia, reaction to medication, pulmonary aspiration, perforation of a viscus, bleeding, failure to diagnose a condition and creating a complication requiring transfusion or operation were discussed with the patient who freely signed the consent.    Description of Procedure: The patient was seen in the Preoperative Area, was examined and was deemed appropriate to proceed.  The patient was taken to [facility] endoscopy room 3, identified as Jose Joseph and the procedure verified as Flexible Video Fiberoptic Bronchoscopy.  A Time Out was held and the above information confirmed.   Prior to the date of the procedure a high-resolution CT scan of the chest was performed. Utilizing ION software program a virtual tracheobronchial tree was generated to allow the creation of distinct navigation pathways to the patient's parenchymal abnormalities. After being taken to the operating room general anesthesia was initiated and the patient  was orally intubated. The video fiberoptic bronchoscope was introduced via the endotracheal tube and a general inspection was performed. There was an area of central pitting at the main carina with some mucous but no endobronchial lesion. The right sided airways were narrowed, especially  the RUL airways. Segmental airways were patent. Left side was normal. Aspiration of the bilateral mainstems was completed to remove any remaining secretions. Robotic catheter inserted into patient's endotracheal tube.   Target #1 right upper lobe nodule: The distinct navigation pathways prepared prior to this procedure were then utilized to navigate to patient's lesion identified on CT scan. The robotic catheter was secured into place and the vision probe was withdrawn.  Lesion location was approximated using fluoroscopy.  Local registration and targeting was performed using Cios three-dimensional imaging. Under fluoroscopic guidance transbronchial needle biopsies and transbronchial forceps biopsies were performed to be sent for cytology and pathology.  Needle in the lesion was confirmed by Cios.  The standard scope was then withdrawn and the endobronchial ultrasound was used to identify and characterize the peritracheal, hilar and bronchial lymph nodes. Inspection showed enlargement at station 4R, 7, 10R, 11R. Using real-time ultrasound guidance Wang needle biopsies were take from Station 4R, 10R, 11R nodes and were sent for cytology.   At the end of the procedure a general airway inspection was performed and there was no evidence of active bleeding. The bronchoscope was removed.  The patient tolerated the procedure well. There was no significant blood loss and there were no obvious complications. A post-procedural chest x-ray is pending.  Samples Target #1: 1. Transbronchial Wang needle biopsies from right upper lobe nodule 2. Transbronchial forceps biopsies from right upper lobe nodule   EBUS Samples: 1. Wang needle biopsies from 4R node 2. Wang needle biopsies from 10R node 3. Wang needle biopsies from 11R node  Other: 1.  Foundation 1 liquid test sent for molecular evaluation   Plans:  The patient will be discharged from the PACU to home when recovered from anesthesia and after chest  x-ray is reviewed. We will review the  cytology, pathology and microbiology results with the patient when they become available. Outpatient followup will be with Dr. Delton Coombes and Saralyn Pilar, NP.     Levy Pupa, MD, PhD 11/21/2023, 10:39 AM [facility] [phone] or if no answer before 7:00PM call [phone] For any issues after 7:00PM please call eLink [phone]

## 2023-11-21 NOTE — Anesthesia Procedure Notes (Signed)
 Procedure Name: Intubation Date/Time: 11/21/2023 9:26 AM  Performed by: Stanton Kidney, CRNAPre-anesthesia Checklist: Patient identified, Patient being monitored, Timeout performed, Emergency Drugs available and Suction available Patient Re-evaluated:Patient Re-evaluated prior to induction Oxygen Delivery Method: Circle system utilized Preoxygenation: Pre-oxygenation with 100% oxygen Induction Type: IV induction Ventilation: Mask ventilation without difficulty Laryngoscope Size: 3 and McGrath Grade View: Grade I Tube type: Oral Tube size: 8.5 mm Number of attempts: 1 Airway Equipment and Method: Stylet Placement Confirmation: ETT inserted through vocal cords under direct vision, positive ETCO2 and breath sounds checked- equal and bilateral Secured at: 23 cm Tube secured with: Tape Dental Injury: Teeth and Oropharynx as per pre-operative assessment

## 2023-11-21 NOTE — Discharge Instructions (Addendum)
 Flexible Bronchoscopy, Care After This sheet gives you information about how to care for yourself after your test. Your doctor may also give you more specific instructions. If you have problems or questions, contact your doctor. Follow these instructions at home: Eating and drinking When your numbness is gone and your cough and gag reflexes have come back, you may: Eat only soft foods. Slowly drink liquids. When you get home after the test, go back to your normal diet. Driving Do not drive for 24 hours if you were given a medicine to help you relax (sedative). Do not drive or use heavy machinery while taking prescription pain medicine. General instructions  Take over-the-counter and prescription medicines only as told by your doctor. Return to your normal activities as told. Ask what activities are safe for you. Do not use any products that have nicotine or tobacco in them. This includes cigarettes and e-cigarettes. If you need help quitting, ask your doctor. Keep all follow-up visits as told by your doctor. This is important. It is very important if you had a tissue sample (biopsy) taken. Get help right away if: You have shortness of breath that gets worse. You get light-headed. You feel like you are going to pass out (faint). You have chest pain. You cough up: More than a little blood. More blood than before. Summary Do not eat or drink anything (not even water) for 2 hours after your test, or until your numbing medicine wears off. Do not use cigarettes. Do not use e-cigarettes. Get help right away if you have chest pain.  Please call our office with any questions or concerns.  [phone].  This information is not intended to replace advice given to you by your health care provider. Make sure you discuss any questions you have with your health care provider. Document Released: 05/29/2009 Document Revised: 07/14/2017 Document Reviewed: 08/19/2016 Elsevier Patient Education  2020  [employer].

## 2023-11-21 NOTE — Anesthesia Postprocedure Evaluation (Signed)
 Anesthesia Post Note  Patient: Jose Joseph  Procedure(s) Performed: VIDEO BRONCHOSCOPY WITH ENDOBRONCHIAL NAVIGATION (Bilateral) ENDOBRONCHIAL ULTRASOUND (EBUS) (Bilateral) BRONCHOSCOPY, WITH BIOPSY BRONCHOSCOPY, WITH NEEDLE ASPIRATION BIOPSY     Patient location during evaluation: PACU Anesthesia Type: General Level of consciousness: awake and alert Pain management: pain level controlled Vital Signs Assessment: post-procedure vital signs reviewed and stable Respiratory status: spontaneous breathing, nonlabored ventilation and respiratory function stable Cardiovascular status: blood pressure returned to baseline and stable Postop Assessment: no apparent nausea or vomiting Anesthetic complications: no  No notable events documented.  Last Vitals:  Vitals:   11/21/23 1130 11/21/23 1141  BP: 102/71 112/71  Pulse: 71 76  Resp: (!) 24 14  Temp:  36.4 C  SpO2: 92% 98%    Last Pain:  Vitals:   11/21/23 1141  TempSrc:   PainSc: 0-No pain                 Paulene Tayag,W. EDMOND

## 2023-11-22 ENCOUNTER — Encounter (HOSPITAL_COMMUNITY): Payer: Self-pay | Admitting: Emergency Medicine

## 2023-11-22 NOTE — Addendum Note (Signed)
 Addendum  created 11/22/23 1429 by Marcene Duos, MD   Attestation recorded in Intraprocedure, Intraprocedure Attestations filed

## 2023-11-23 LAB — CYTOLOGY - NON PAP

## 2023-11-24 LAB — CYTOLOGY - NON PAP

## 2023-11-29 ENCOUNTER — Encounter: Payer: Self-pay | Admitting: Acute Care

## 2023-11-29 ENCOUNTER — Ambulatory Visit (INDEPENDENT_AMBULATORY_CARE_PROVIDER_SITE_OTHER): Admitting: Acute Care

## 2023-11-29 VITALS — BP 134/85 | HR 79 | Ht 73.0 in | Wt 266.4 lb

## 2023-11-29 DIAGNOSIS — Z87891 Personal history of nicotine dependence: Secondary | ICD-10-CM

## 2023-11-29 DIAGNOSIS — C779 Secondary and unspecified malignant neoplasm of lymph node, unspecified: Secondary | ICD-10-CM | POA: Diagnosis not present

## 2023-11-29 DIAGNOSIS — C3411 Malignant neoplasm of upper lobe, right bronchus or lung: Secondary | ICD-10-CM | POA: Diagnosis not present

## 2023-11-29 DIAGNOSIS — C7951 Secondary malignant neoplasm of bone: Secondary | ICD-10-CM | POA: Diagnosis not present

## 2023-11-29 DIAGNOSIS — Z9889 Other specified postprocedural states: Secondary | ICD-10-CM

## 2023-11-29 DIAGNOSIS — C3481 Malignant neoplasm of overlapping sites of right bronchus and lung: Secondary | ICD-10-CM

## 2023-11-29 DIAGNOSIS — C3490 Malignant neoplasm of unspecified part of unspecified bronchus or lung: Secondary | ICD-10-CM

## 2023-11-29 MED ORDER — BREZTRI AEROSPHERE 160-9-4.8 MCG/ACT IN AERO: 2.0000 | INHALATION_SPRAY | Freq: Two times a day (BID) | RESPIRATORY_TRACT | Status: DC

## 2023-11-29 NOTE — Progress Notes (Signed)
 History of Present Illness Jose Joseph is a 68 y.o. male  former smoker ( Quit 2013 with a 42 pack year smoking history  referred for lung nodule consult 10/16/2023. He will be followed by Dr. Baldwin Levee.   Synopsis Jose Joseph is a 68 year old male former smoker who presents with lung nodules found on imaging 07/2023.Aaron Aas He is accompanied by his wife, who assisted with translation of Spanish imaging report, as we have been unable to get the CD images to open in the office today.   Lung nodules were identified on imaging performed in [place] 07/2023 , and show a significant right lung lesion in the 4.3 x 3.5 cm, described as spiculated with satellite lesions.  Additional smaller nodules are present in both lungs.   Pt. has been experiencing respiratory symptoms since January 2025, starting with a cold that progressed to involve his lungs. Initially, he had a slight fever that resolved. He describes a persistent cough with sputum that is clear to yellow, occasionally gray, but no hemoptysis. He experiences shortness of breath with exertion and wheezing, particularly at night. He was given  Trelegy and a nasal spray in for sinus issues in [place],  which he found helpful. He has not used the  albuterol  inhaler  that was also provided at that time, but has it available for shortness of breath. He denies any  significant weight loss or hemoptysis.   He has a history of smoking, having quit in 2013 after smoking approximately a pack a day since age 75. 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   His family history is significant for various cancers, including lung cancer in an uncle, prostate cancer in a brother, kidney cancer in a sister, pancreatic cancer in another brother, and an unspecified cancer in a paternal aunt.  Pt had PET scan done 10/19/2023 which was + for multiple  hypermetabolic pulmonary nodules bilaterally, largest a spiculated nodule inferiorly in the right upper lobe which likely represents the primary malignancy. Hypermetabolic mediastinal and hilar lymph nodes bilaterally, consistent with metastatic disease. Hypermetabolic osseous lesions involving the sternum, and ribs.   Pt. Was scheduled for bronchoscopy with biopsies and EBUS on4/03/2024. He is here today to follow up on cytology, and to ensure he has done well post procedure.    12/04/2023 Pt. Presents for follow up after bronchoscopy with biopsies on 11/21/2023. He states following the bronchoscopy with biopsies  he experienced a low-grade fever and malaise for three to four days, with symptoms improving in the mornings and worsening in the afternoons. He did not seek medical attention during this period and eventually recovered.  He notes an increase in coughing but denies discolored sputum. He experienced hemoptysis for one to two days following the procedure, which he attributes to the biopsies performed on his lung nodule and lymph nodes.  We have reviewed his cytology results.  The biopsy results revealed adenocarcinoma in the lung and metastasis to the right number four, ten, and eleven lymph nodes. There is also suspicion of bone metastasis based on the PET scan results.  Both the patient and his wife understand the results. We discussed the best option for treatment was referral to medical oncology as this appears to be Stage IV adenocarcinoma of the lung/  with lymph node and bone metastasis. He will need an MRI  Brain to complete staging which has been ordered. He will have PD-L and other molecular testing, as well as blood testing with [facility].  Both patient and his wife are in agreement with the plan.   He is currently using Trelegy once daily for his respiratory condition but finds it unaffordable at $500 per month after insurance. He also uses albuterol  as a rescue inhaler, typically  taking a puff 30 minutes before using Trelegy.  He quit smoking over ten years ago after realizing the health risks, particularly the need to avoid oxygen therapy. He attributes his lung condition to past smoking and exposure to chemicals used in tobacco farming.  His family history is significant for cancer, with three siblings having been affected, including a brother who died from pancreatic cancer last year.   Test Results: Cytology 11/21/2023 FINAL MICROSCOPIC DIAGNOSIS:  A. LUNG, RUL, FINE NEEDLE ASPIRATION:  - Adenocarcinoma   COMMENT:   Immunohistochemical stains show the tumor cells are positive for TTF-1  and are negative for p40. The findings are in keeping with an  adenocarcinoma. Controls worked appropriately.   B. LYMPH NODE,4R, FINE NEEDLE ASPIRATION:  - Adenocarcinoma  - Lymphoid tissue identified   C. LYMPH NODE, 10R, FINE NEEDLE ASPIRATION:  - No malignant cells identified  - Lymphocytes identified   D. LYMPH NODE, 11R, FINE NEEDLE ASPIRATION:  - Adenocarcinoma  - Lymphoid tissue identified   Imaging PET scan done 10/19/2023, read 11/09/2023 Findings are most consistent with metastatic lung cancer. There are multiple hypermetabolic pulmonary nodules bilaterally, largest a spiculated nodule inferiorly in the right upper lobe which likely represents the primary malignancy. Hypermetabolic mediastinal and hilar lymph nodes bilaterally, consistent with metastatic disease. Hypermetabolic osseous lesions involving the sternum, left 6th rib and right iliac bone, consistent with metastatic disease. No evidence of metastatic disease in the abdomen or pelvis. Aortic Atherosclerosis (ICD10-I70.0) and Emphysema (ICD10-J43.9).    11/10/2023 PFT             Interpretation of Spanish transcription of CT Chest 07/2023 Per wife's interpretation of the Spanish written report, there is a  right lung lesion measuring  4.3 x 3.5 cm, described as spiculated with  satellite lesions.      Latest Ref Rng & Units 11/30/2023    9:44 AM 11/21/2023    7:28 AM 01/16/2021    6:23 AM  CBC  WBC 4.0 - 10.5 K/uL 9.7  8.2  12.5   Hemoglobin 13.0 - 17.0 g/dL 16.1  09.6  04.5   Hematocrit 39.0 - 52.0 % 45.0  45.5  34.7   Platelets 150 - 400 K/uL 239  215  174        Latest Ref Rng & Units 11/30/2023    9:44 AM 01/16/2021    6:23 AM 01/12/2021    1:06 PM  BMP  Glucose 70 - 99 mg/dL 409  811  95   BUN 8 - 23 mg/dL 15  13  17    Creatinine 0.61 - 1.24 mg/dL 9.14  7.82  9.56   Sodium 135 - 145 mmol/L 135  135  138   Potassium 3.5 - 5.1 mmol/L 3.4  3.4  4.2   Chloride 98 - 111 mmol/L 99  103  102   CO2 22 - 32 mmol/L 26  24  26    Calcium 8.9 - 10.3 mg/dL 9.5  8.4  9.5     BNP No results found for: "BNP"  ProBNP No results found for: "PROBNP"  PFT    Component Value Date/Time   FEV1PRE 2.55 10/26/2023 0821   FEV1POST 2.54 10/26/2023 0821   FVCPRE 3.96 10/26/2023 0821   FVCPOST 4.02 10/26/2023 0821   TLC 7.65 10/26/2023 0821   DLCOUNC 32.76 10/26/2023 0821   PREFEV1FVCRT 64 10/26/2023 0821   PSTFEV1FVCRT 63 10/26/2023 0821    CT Super D Chest Wo Contrast Result Date: 11/27/2023 CLINICAL DATA:  Lung nodules. Concern for rim pulmonary metastasis. * Tracking Code: BO * EXAM: CT CHEST WITHOUT CONTRAST TECHNIQUE: Multidetector CT imaging of the chest was performed using thin slice collimation for electromagnetic bronchoscopy planning purposes, without intravenous contrast. RADIATION DOSE REDUCTION: This exam was performed according to the departmental dose-optimization program which includes automated exposure control, adjustment of the mA and/or kV according to patient size and/or use of iterative reconstruction technique. COMPARISON:  FDG PET scan 10/19/2023 FINDINGS: Cardiovascular: No significant vascular findings. Normal heart size. No pericardial effusion. Mediastinum/Nodes: Interval increase in mediastinal adenopathy. Prevascular nodes in the high LEFT  mediastinum measuring 1.7 cm on image 60 are new from prior. Again demonstrated paratracheal nodes which are hypermetabolic on comparison CT. Lungs/Pleura: Bilateral pulmonary nodules of varying size. Largest nodule in the RIGHT upper lobe measures 2.5 x 2.6 cm compared to 2.5 by 2.7 cm on comparison PET-CT for no interval change. Additional small bilateral pulmonary nodules are present within LEFT and RIGHT lungs and not significantly change. Upper Abdomen: Limited view of the liver, kidneys, pancreas are unremarkable. Normal adrenal glands. Musculoskeletal: No aggressive osseous lesion. IMPRESSION: 1. Bilateral pulmonary nodules which are hypermetabolic on comparison FDG PET scan. Largest nodule RIGHT upper lobe as described. 2. Interval progression of mediastinal lymphadenopathy compared to recent PET-CT scan. Electronically Signed   By: Deboraha Fallow M.D.   On: 11/27/2023 13:49   DG Chest Port 1 View Result Date: 11/21/2023 CLINICAL DATA:  Status post bronchoscopy with biopsy. EXAM: PORTABLE CHEST 1 VIEW COMPARISON:  PET scan of October 19, 2023. FINDINGS: The heart size and mediastinal contours are within normal limits. No pneumothorax or pleural effusion is noted. Left lung is clear. Right upper lobe atelectasis is noted which most likely is secondary to malignancy. The visualized skeletal structures are unremarkable. IMPRESSION: No definite pneumothorax is noted status post bronchoscopy with biopsy. Right upper lobe atelectasis is noted most likely secondary to malignancy. Electronically Signed   By: Rosalene Colon M.D.   On: 11/21/2023 12:53   DG C-ARM BRONCHOSCOPY Result Date: 11/21/2023 C-ARM BRONCHOSCOPY: Fluoroscopy was utilized by the requesting physician.  No radiographic interpretation.     Past medical hx Past Medical History:  Diagnosis Date   Arthritis    Asthma    Complication of anesthesia    after his neck surgery pt. trying to get up and walk out   Depression    Dyspnea     when he was smoking   History of kidney stones    2021   Pneumonia    during COVID in 2021     Social History   Tobacco Use   Smoking status: Former    Current packs/day: 0.00    Average packs/day: 1 pack/day for 35.0 years (35.0 ttl pk-yrs)    Types: Cigarettes    Start date: 47    Quit date: 2014    Years since quitting: 11.3    Passive exposure: Past   Smokeless tobacco: Former  Substance Use Topics   Alcohol use: Not Currently    Alcohol/week: 2.0 standard drinks of alcohol  Types: 2 Cans of beer per week   Drug use: Yes    Types: Marijuana    Comment: last: 1 week ago    Mr.Furuya reports that he quit smoking about 11 years ago. His smoking use included cigarettes. He started smoking about 46 years ago. He has a 35 pack-year smoking history. He has been exposed to tobacco smoke. He has quit using smokeless tobacco. He reports that he does not currently use alcohol after a past usage of about 2.0 standard drinks of alcohol per week. He reports current drug use. Drug: Marijuana.  Tobacco Cessation: Counseling given: Not Answered Former smoker with 42 pack year smoking history, quit 2013.  Past surgical hx, Family hx, Social hx all reviewed.  Current Outpatient Medications on File Prior to Visit  Medication Sig   albuterol  (VENTOLIN  HFA) 108 (90 Base) MCG/ACT inhaler Inhale 2 puffs into the lungs every 6 (six) hours as needed for wheezing or shortness of breath.   cetirizine (ZYRTEC) 10 MG tablet Take 10 mg by mouth in the morning.   citalopram  (CELEXA ) 20 MG tablet Take 20 mg by mouth in the morning.   fexofenadine (ALLEGRA) 180 MG tablet Take 180 mg by mouth in the morning.   Fluticasone-Umeclidin-Vilant (TRELEGY ELLIPTA ) 100-62.5-25 MCG/ACT AEPB Inhale 1 puff into the lungs daily.   Fluticasone-Umeclidin-Vilant (TRELEGY ELLIPTA ) 100-62.5-25 MCG/ACT AEPB Inhale 1 puff into the lungs daily at 2 PM.   No current facility-administered medications on file prior to  visit.     Allergies  Allergen Reactions   Bee Venom Anaphylaxis    syncope    Review Of Systems:  Constitutional:   No  weight loss, night sweats,  Fevers, chills, fatigue, or  lassitude.  HEENT:   No headaches,  Difficulty swallowing,  Tooth/dental problems, or  Sore throat,                No sneezing, itching, ear ache, nasal congestion, post nasal drip,   CV:  No chest pain,  Orthopnea, PND, swelling in lower extremities, anasarca, dizziness, palpitations, syncope.   GI  No heartburn, indigestion, abdominal pain, nausea, vomiting, diarrhea, change in bowel habits, loss of appetite, bloody stools.   Resp: + shortness of breath with exertion less at rest.  No excess mucus, no productive cough,  No non-productive cough,  No coughing up of blood.  No change in color of mucus.  No wheezing.  No chest wall deformity  Skin: no rash or lesions.  GU: no dysuria, change in color of urine, no urgency or frequency.  No flank pain, no hematuria   MS:  No joint pain or swelling.  No decreased range of motion.  No back pain.  Psych:  No change in mood or affect. + depression or anxiety.  No memory loss.   Vital Signs BP 134/85 (BP Location: Left Arm, Patient Position: Sitting, Cuff Size: Normal)   Pulse 79   Ht 6\' 1"  (1.854 m)   Wt 266 lb 6.4 oz (120.8 kg)   SpO2 95%   BMI 35.15 kg/m    Physical Exam:  General- No distress,  A&Ox3, pleasant ENT: No sinus tenderness, TM clear, pale nasal mucosa, no oral exudate,no post nasal drip, no LAN Cardiac: S1, S2, regular rate and rhythm, no murmur Chest: No wheeze/ rales/ dullness; no accessory muscle use, no nasal flaring, no sternal retractions, diminished per bases Abd.: Soft Non-tender, ND, BS +, Body mass index is 35.15 kg/m.  Ext: No clubbing cyanosis, edema,  no obvious deformity Neuro:  normal strength, MAE x 4, A&O x 3 Skin: No rashes, warm and dry, no obvious lesions  Psych: normal mood and behavior   Assessment/Plan Post  bronchoscopy with biopsies New diagnosis adenocarcinoma of the lung with metastasis to bone and lymph nodes. Former smoker Plan I am glad you have done well since the procedure.  Your biopsies were positive for adenocarcinoma in the RUL and 4 R, 10 R, and 11 R lymph node.  I will refer you to medical; oncology in [place]. There was also concern for bone metastasis on the PET, so I will refer you to [facility] at [facility].  You will get calls to get these appointments scheduled.  They will take great care of you. Please call if you need us . We will do a therapeutic trial of Breztri  as Trelegy is costing you $500 per month out of pocket with your insurance.  Breztri   take  2 puffs in the morning and 2 puffs in the evening Rinse mouth after use. Do not take Trelegy while using Breztri . Call if you like the Breztri  and we will send in a prescription.  Follow up in 3 months to make sure you are doing well on inhalers.  Call if you need us  sooner.  Please contact office for sooner follow up if symptoms do not improve or worsen or seek emergency care    I spent 30 minutes dedicated to the care of this patient on the date of this encounter to include pre-visit review of records, face-to-face time with the patient discussing conditions above, post visit ordering of testing, clinical documentation with the electronic health record, making appropriate referrals as documented, and communicating necessary information to the patient's healthcare team.    Raejean Bullock, NP 12/04/2023  6:21 PM

## 2023-11-29 NOTE — Patient Instructions (Signed)
 It is good to see you today. I am glad you have done well since the procedure.  Your biopsies were positive for adenocarcinoma in the RUL and 4 R, 10 R, and 11 R lymph node.  I will refer you to medical; oncology in [place]. There was also concern for bone metastasis on the PET, so I will refer you to [facility] radiation oncology at [facility].  You will get calls to get these appointments scheduled.  They will take great care of you. Please call if you need us . We will do a therapeutic trial of Breztri as Trelegy is costing you $500 per month out of pocket with your insurance.  Breztri  take  2 puffs in the morning and 2 puffs in the evening Rinse mouth after use. Do not take Trelegy while using Breztri. Call if you like the Breztri and we will send in a prescription.  Follow up in 3 months to make sure you are doing well on inhalers.  Call if you need us  sooner.  Please contact office for sooner follow up if symptoms do not improve or worsen or seek emergency care

## 2023-11-30 ENCOUNTER — Other Ambulatory Visit: Payer: Self-pay

## 2023-11-30 ENCOUNTER — Encounter (HOSPITAL_COMMUNITY): Payer: Self-pay

## 2023-11-30 ENCOUNTER — Inpatient Hospital Stay

## 2023-11-30 ENCOUNTER — Inpatient Hospital Stay: Attending: Hematology | Admitting: Hematology

## 2023-11-30 ENCOUNTER — Telehealth: Payer: Self-pay | Admitting: Acute Care

## 2023-11-30 VITALS — BP 100/83 | HR 92 | Temp 97.5°F | Resp 18 | Ht 73.0 in | Wt 265.0 lb

## 2023-11-30 DIAGNOSIS — Z8042 Family history of malignant neoplasm of prostate: Secondary | ICD-10-CM | POA: Insufficient documentation

## 2023-11-30 DIAGNOSIS — C7951 Secondary malignant neoplasm of bone: Secondary | ICD-10-CM | POA: Diagnosis not present

## 2023-11-30 DIAGNOSIS — C3491 Malignant neoplasm of unspecified part of right bronchus or lung: Secondary | ICD-10-CM | POA: Insufficient documentation

## 2023-11-30 DIAGNOSIS — C3490 Malignant neoplasm of unspecified part of unspecified bronchus or lung: Secondary | ICD-10-CM

## 2023-11-30 DIAGNOSIS — Z5111 Encounter for antineoplastic chemotherapy: Secondary | ICD-10-CM | POA: Insufficient documentation

## 2023-11-30 DIAGNOSIS — C3411 Malignant neoplasm of upper lobe, right bronchus or lung: Secondary | ICD-10-CM | POA: Diagnosis present

## 2023-11-30 DIAGNOSIS — Z8051 Family history of malignant neoplasm of kidney: Secondary | ICD-10-CM | POA: Insufficient documentation

## 2023-11-30 DIAGNOSIS — Z87891 Personal history of nicotine dependence: Secondary | ICD-10-CM | POA: Insufficient documentation

## 2023-11-30 LAB — COMPREHENSIVE METABOLIC PANEL WITH GFR
ALT: 17 U/L (ref 0–44)
AST: 18 U/L (ref 15–41)
Albumin: 3.6 g/dL (ref 3.5–5.0)
Alkaline Phosphatase: 78 U/L (ref 38–126)
Anion gap: 10 (ref 5–15)
BUN: 15 mg/dL (ref 8–23)
CO2: 26 mmol/L (ref 22–32)
Calcium: 9.5 mg/dL (ref 8.9–10.3)
Chloride: 99 mmol/L (ref 98–111)
Creatinine, Ser: 0.84 mg/dL (ref 0.61–1.24)
GFR, Estimated: >60 mL/min (ref >60)
Glucose, Bld: 100 mg/dL — ABNORMAL HIGH (ref 70–99)
Potassium: 3.4 mmol/L — ABNORMAL LOW (ref 3.5–5.1)
Sodium: 135 mmol/L (ref 135–145)
Total Bilirubin: 0.5 mg/dL (ref 0.0–1.2)
Total Protein: 7.5 g/dL (ref 6.5–8.1)

## 2023-11-30 LAB — CBC WITH DIFFERENTIAL/PLATELET
Abs Immature Granulocytes: 0.06 10*3/uL (ref 0.00–0.07)
Basophils Absolute: 0 10*3/uL (ref 0.0–0.1)
Basophils Relative: 0 %
Eosinophils Absolute: 0.4 10*3/uL (ref 0.0–0.5)
Eosinophils Relative: 4 %
HCT: 45 % (ref 39.0–52.0)
Hemoglobin: 15.2 g/dL (ref 13.0–17.0)
Immature Granulocytes: 1 %
Lymphocytes Relative: 17 %
Lymphs Abs: 1.7 10*3/uL (ref 0.7–4.0)
MCH: 30.9 pg (ref 26.0–34.0)
MCHC: 33.8 g/dL (ref 30.0–36.0)
MCV: 91.5 fL (ref 80.0–100.0)
Monocytes Absolute: 0.9 10*3/uL (ref 0.1–1.0)
Monocytes Relative: 9 %
Neutro Abs: 6.7 10*3/uL (ref 1.7–7.7)
Neutrophils Relative %: 69 %
Platelets: 239 10*3/uL (ref 150–400)
RBC: 4.92 MIL/uL (ref 4.22–5.81)
RDW: 12.5 % (ref 11.5–15.5)
WBC: 9.7 10*3/uL (ref 4.0–10.5)
nRBC: 0 % (ref 0.0–0.2)

## 2023-11-30 NOTE — Telephone Encounter (Signed)
 Hey there this patient has another MRI BRAIN W or WO Contrast scheduled by   Paulett Boros, MD  In oncology. Do we still need this order?

## 2023-11-30 NOTE — Patient Instructions (Addendum)
 [facility]  Discharge Instructions  You were seen and examined today by Dr. Cheree Cords. Dr. Katragadda is a medical oncologist, meaning that he specializes in the treatment of cancer diagnoses. Dr. Cheree Cords discussed your past medical history, family history of cancers, and the events that led to you being here today.  You were referred to Dr. Cheree Cords due to a new diagnosis of lung cancer that appears to have began in your right lung and spread to your left lung, lymph nodes and bones on your PET scan, making it a Stage IV lung cancer.  Because this is a Stage IV cancer, it is controllable but not curable. To complete the work-up and prepare you for treatment, Dr. Katragadda has recommended a brain MRI as well as Port-A-Cath placement.  Dr. Cheree Cords has recommended additional testing on your recent biopsy specimen as well as additional blood work today.  Follow-up with Dr. Katragadda following the brain MRI to discuss treatment plan and next steps.  Thank you for choosing [facility] to provide your oncology and hematology care.   To afford each patient quality time with our provider, please arrive at least 15 minutes before your scheduled appointment time. You may need to reschedule your appointment if you arrive late (10 or more minutes). Arriving late affects you and other patients whose appointments are after yours.  Also, if you miss three or more appointments without notifying the office, you may be dismissed from the clinic at the provider's discretion.    Again, thank you for choosing [facility].  Our hope is that these requests will decrease the amount of time that you wait before being seen by our physicians.   If you have a lab appointment with the Cancer Center - please note that after April 8th, all labs will be drawn in the cancer center.  You do not have to check in or register with the main entrance as you  have in the past but will complete your check-in at the cancer center.            _____________________________________________________________  Should you have questions after your visit to [facility], please contact our office at [phone] and follow the prompts.  Our office hours are 8:00 a.m. to 4:30 p.m. Monday - Thursday and 8:00 a.m. to 2:30 p.m. Friday.  Please note that voicemails left after 4:00 p.m. may not be returned until the following business day.  We are closed weekends and all major holidays.  You do have access to a nurse 24-7, just call the main number to the clinic [phone] and do not press any options, hold on the line and a nurse will answer the phone.    For prescription refill requests, have your pharmacy contact our office and allow 72 hours.    Masks are no longer required in the cancer centers. If you would like for your care team to wear a mask while they are taking care of you, please let them know. You may have one support person who is at least 70 years old accompany you for your appointments.

## 2023-11-30 NOTE — Progress Notes (Signed)
 [facility] [address] [place] [address]   Clinic Day:  11/30/2023  Referring physician: Carylon Perches, MD  Patient Care Team: Jose Perches, MD as PCP - General (Internal Medicine) Jose Massed, MD as Medical Oncologist (Medical Oncology) Jose Sarah, RN as Oncology Nurse Navigator (Medical Oncology)   ASSESSMENT & PLAN:   Assessment:  1.  Stage IV adenocarcinoma of the lung to the bones: - PET/CT (10/19/2023): Bilateral lung nodules with largest right upper lobe nodule measuring 3.2 x 3 cm, bilateral hilar and mediastinal lymphadenopathy, right supraclavicular lymphadenopathy, hypermetabolic bone lesions involving sternum, left sixth rib and right iliac bone. - Bronchoscopy and biopsy on 11/21/2023 - Pathology (11/21/2023): RUL nodule FNA: Adenocarcinoma, positive for TTF-1 and negative for p40.  FNA of lymph node stations 4R,11R consistent with adenocarcinoma.  2. Social/Family History: -Lives at home with wife. Resides in [place] 4-5 months each year. 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 Quit tobacco use 10 years ago of 1-2 ppd for over 40 years. Possible asbestos exposure when working at [employer]. 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  -Sister died from renal cancer. Brother died from prostate cancer. Another brother died from pancreatic cancer. Maternal uncle died from lung cancer. Paternal aunt died from cancer, type unknown.  Plan:  1.  Stage IV adenocarcinoma of the lung to the bones: - We have reviewed images and pathology reports with the patient and his wife in detail. - Recommend MRI of the brain with and without contrast to complete staging. - We discussed the role of molecular testing involving PD-L1 and other targetable mutations on the tissue as well as blood testing with [employer]. - Recommend port placement and follow-up in 3 weeks to discuss further plan.   Orders Placed This Encounter  Procedures   MR Brain W Wo  Contrast    Standing Status:   Future    Expected Date:   12/01/2023    Expiration Date:   11/29/2024    If indicated for the ordered procedure, I authorize the administration of contrast media per Radiology protocol:   Yes    What is the patient's sedation requirement?:   No Sedation    Does the patient have a pacemaker or implanted devices?:   No    Use SRS Protocol?:   No    Preferred imaging location?:   [facility] (table limit - 550lbs)    Release to patient:   Immediate   IR IMAGING GUIDED PORT INSERTION    Standing Status:   Future    Expected Date:   12/01/2023    Expiration Date:   11/29/2024    Reason for Exam (SYMPTOM  OR DIAGNOSIS REQUIRED):   chemotherpay/immunotherapy administration    Preferred Imaging Location?:   [facility]    Release to patient:   Immediate   CBC with Differential    Standing Status:   Future    Number of Occurrences:   1    Expected Date:   11/30/2023    Expiration Date:   11/29/2024   Comprehensive metabolic panel    Standing Status:   Future    Number of Occurrences:   1    Expected Date:   11/30/2023    Expiration Date:   11/29/2024      Jose Joseph,acting as a scribe for Jose Massed, MD.,have documented all relevant documentation on the behalf of Jose Massed, MD,as directed by  Jose Massed, MD while in  the presence of Jose Massed, MD.   I, Jose Massed MD, have reviewed the above documentation for accuracy and completeness, and I agree with the above.   Jose Massed, MD   4/17/20251:00 PM  CHIEF COMPLAINT/PURPOSE OF CONSULT:   Diagnosis: Metastatic adenocarcinoma of the lung to the bones   Cancer Staging  Adenocarcinoma of right lung [facility]) Staging form: Lung, AJCC V9 - Clinical stage from 11/30/2023: cT4, cN3(f), cM1 - Unsigned    Prior Therapy: None  Current Therapy: Under workup   HISTORY OF PRESENT ILLNESS:   Oncology History   No history exists.       Jose Joseph is a 68 y.o. male presenting to clinic today for evaluation of RUL lung adenocarcinoma at the request of Jose Robinsons, NP.  Patient was originally seen by pulmonology on 10/16/23 after imaging from December 2024 done in [place] showed a spiculated lung lesion of 4.3 x 3.5 cm in size with satellite lesions and smaller bilateral lung nodules. He then had an initial PET on 10/19/23 which showed: Findings are most consistent with metastatic lung cancer. There are multiple hypermetabolic pulmonary nodules bilaterally, largest a spiculated nodule inferiorly in the right upper lobe which likely represents the primary malignancy. Hypermetabolic mediastinal and hilar lymph nodes bilaterally, consistent with metastatic disease. Hypermetabolic osseous lesions involving the sternum, left 6th rib and right iliac bone, consistent with metastatic disease. No evidence of metastatic disease in the abdomen or pelvis.   Jose Joseph then had bronchoscopy with biopsies of the lung nodules under Dr. Delton Coombes on 11/21/23. Cytology of the RUL lung nodules revealed: adenocarcinoma with tumor cells positive for TTF-1 and negative for p40. Lymph nodes 4R and 11R biopsies showed: adenocarcinoma and lymphoid tissue identified. Lymph node 10R found: no malignant cells, but lymphocytes identified.   Today, he states that he is doing well overall. His appetite level is at 100%. His energy level is at 50%. He is accompanied by his wife and   Jose Joseph notes he was first aware of lung cancer in January 2025 after a lung infection that required antibiotics and imaging to be done.  He has unintentionally lost 4 pounds in the last 1.5 months. He notes a normal appetite. Jose Joseph denies any prior history of MI's, CVA's, or TIA's.   He reports chest pain attributable to coughing and is treating pain with OTC ibuprofen. His coughing has worsened recently with an onset before bronchoscopy. He also notes occasional wheezing, worsened when lying down.  Wheezing and coughing improve when lying on his left side. Jose Joseph also reports occasional band-like lower abdominal pain present on exertion and when lifting weights. He denies any headaches, vision changes, or ankle swellings.   Surgical history includes hip replacements, hernia repair, left knee arthroplasty, neck surgery that improved neuropathy in right shoulder, and right wrist surgery. He denies any peripheral neuropathy. Conway stays in [place] for 4-5 months of the year, as his wife has a home there.  PAST MEDICAL HISTORY:   Past Medical History: Past Medical History:  Diagnosis Date   Arthritis    Asthma    Complication of anesthesia    after his neck surgery pt. trying to get up and walk out   Depression    Dyspnea    when he was smoking   History of kidney stones    2021   Pneumonia    during COVID in 2021    Surgical History: Past Surgical History:  Procedure Laterality Date   BRONCHIAL BIOPSY  11/21/2023  Procedure: BRONCHOSCOPY, WITH BIOPSY;  Surgeon: [name], MD;  Location: [facility] ENDOSCOPY;  Service: Pulmonary;;   BRONCHIAL NEEDLE ASPIRATION BIOPSY  11/21/2023   Procedure: BRONCHOSCOPY, WITH NEEDLE ASPIRATION BIOPSY;  Surgeon: [name], MD;  Location: [facility] ENDOSCOPY;  Service: Pulmonary;;   COLONOSCOPY WITH PROPOFOL N/A 01/19/2023   Procedure: COLONOSCOPY WITH PROPOFOL;  Surgeon: Corbin Ade, MD;  [facility] ENDO SUITE;  Service: Endoscopy;  Laterality: N/A;  200pm, asa 2   ENDOBRONCHIAL ULTRASOUND Bilateral 11/21/2023   Procedure: ENDOBRONCHIAL ULTRASOUND (EBUS);  Surgeon: [name], MD;  Location: [facility] ENDOSCOPY;  Service: Pulmonary;  Laterality: Bilateral;   KNEE ARTHROCENTESIS     NECK SURGERY     POLYPECTOMY  01/19/2023   Procedure: POLYPECTOMY;  Surgeon: Corbin Ade, MD;  Location: [facility] ENDO SUITE;  Service: Endoscopy;;   TOTAL HIP ARTHROPLASTY Left 01/15/2021   Procedure: LEFT TOTAL HIP ARTHROPLASTY ANTERIOR APPROACH;  Surgeon: Eldred Manges, MD;   Location: [facility] OR;  Service: Orthopedics;  Laterality: Left;   VIDEO BRONCHOSCOPY WITH ENDOBRONCHIAL NAVIGATION Bilateral 11/21/2023   Procedure: VIDEO BRONCHOSCOPY WITH ENDOBRONCHIAL NAVIGATION;  Surgeon: [name], MD;  Location: [facility] ENDOSCOPY;  Service: Pulmonary;  Laterality: Bilateral;   WRIST ARTHROPLASTY      Social History: Social History   Socioeconomic History   Marital status: Married    Spouse name: Not on file   Number of children: 2   Years of education: Not on file   Highest education level: Not on file  Occupational History   Occupation: retired  Tobacco Use   Smoking status: Former    Current packs/day: 0.00    Average packs/day: 1 pack/day for 35.0 years (35.0 ttl pk-yrs)    Types: Cigarettes    Start date: 53    Quit date: 2014    Years since quitting: 11.2    Passive exposure: Past   Smokeless tobacco: Former  Substance and Sexual Activity   Alcohol use: Not Currently    Alcohol/week: 2.0 standard drinks of alcohol    Types: 2 Cans of beer per week   Drug use: Yes    Types: Marijuana    Comment: last: 1 week ago   Sexual activity: Not on file  Other Topics Concern   Not on file  Social History Narrative   Not on file   Social Drivers of Health   Financial Resource Strain: Not on file  Food Insecurity: Not on file  Transportation Needs: Not on file  Physical Activity: Not on file  Stress: Not on file  Social Connections: Not on file  Intimate Partner Violence: Not on file    Family History: Family History  Problem Relation Age of Onset   Kidney cancer Sister    Prostate cancer Brother     Current Medications:  Current Outpatient Medications:    albuterol (VENTOLIN HFA) 108 (90 Base) MCG/ACT inhaler, Inhale 2 puffs into the lungs every 6 (six) hours as needed for wheezing or shortness of breath., Disp: 8 g, Rfl: 2   budeson-glycopyrrolate-formoterol (BREZTRI AEROSPHERE) 160-9-4.8 MCG/ACT AERO inhaler, Inhale 2 puffs into the lungs in  the morning and at bedtime., Disp: , Rfl:    cetirizine (ZYRTEC) 10 MG tablet, Take 10 mg by mouth in the morning., Disp: , Rfl:    citalopram (CELEXA) 20 MG tablet, Take 20 mg by mouth in the morning., Disp: , Rfl:    fexofenadine (ALLEGRA) 180 MG tablet, Take 180 mg by mouth in the morning.,  Disp: , Rfl:    Fluticasone-Umeclidin-Vilant (TRELEGY ELLIPTA) 100-62.5-25 MCG/ACT AEPB, Inhale 1 puff into the lungs daily., Disp: 1 each, Rfl: 6   Fluticasone-Umeclidin-Vilant (TRELEGY ELLIPTA) 100-62.5-25 MCG/ACT AEPB, Inhale 1 puff into the lungs daily at 2 PM., Disp: , Rfl:    Allergies: Allergies  Allergen Reactions   Bee Venom Anaphylaxis    syncope    REVIEW OF SYSTEMS:   Review of Systems  Constitutional:  Negative for chills, fatigue and fever.  HENT:   Negative for lump/mass, mouth sores, nosebleeds, sore throat and trouble swallowing.   Eyes:  Negative for eye problems.  Respiratory:  Positive for cough, shortness of breath and wheezing.   Cardiovascular:  Positive for chest pain (6/10 severity). Negative for leg swelling and palpitations.  Gastrointestinal:  Negative for abdominal pain, constipation, diarrhea, nausea and vomiting.  Genitourinary:  Positive for difficulty urinating. Negative for bladder incontinence, dysuria, frequency, hematuria and nocturia.   Musculoskeletal:  Negative for arthralgias, back pain, flank pain, myalgias and neck pain.  Skin:  Negative for itching and rash.  Neurological:  Negative for dizziness, headaches and numbness.  Hematological:  Does not bruise/bleed easily.  Psychiatric/Behavioral:  Negative for depression, sleep disturbance and suicidal ideas. The patient is not nervous/anxious.   All other systems reviewed and are negative.    VITALS:   Blood pressure 100/83, pulse 92, temperature (!) 97.5 F (36.4 C), temperature source Tympanic, resp. rate 18, height 6\' 1"  (1.854 m), weight 265 lb (120.2 kg), SpO2 95%.  Wt Readings from Last 3  Encounters:  11/30/23 265 lb (120.2 kg)  11/29/23 266 lb 6.4 oz (120.8 kg)  11/21/23 269 lb (122 kg)    Body mass index is 34.96 kg/m.  Performance status (ECOG): 1 - Symptomatic but completely ambulatory  PHYSICAL EXAM:   Physical Exam Vitals and nursing note reviewed. Exam conducted with a chaperone present.  Constitutional:      Appearance: Normal appearance.  Cardiovascular:     Rate and Rhythm: Normal rate and regular rhythm.     Pulses: Normal pulses.     Heart sounds: Normal heart sounds.  Pulmonary:     Effort: Pulmonary effort is normal.     Breath sounds: Normal breath sounds.  Abdominal:     Palpations: Abdomen is soft. There is no hepatomegaly, splenomegaly or mass.     Tenderness: There is no abdominal tenderness.  Musculoskeletal:     Right lower leg: No edema.     Left lower leg: No edema.  Lymphadenopathy:     Cervical: No cervical adenopathy.     Right cervical: No superficial, deep or posterior cervical adenopathy.    Left cervical: No superficial, deep or posterior cervical adenopathy.     Upper Body:     Right upper body: No supraclavicular or axillary adenopathy.     Left upper body: No supraclavicular or axillary adenopathy.  Neurological:     General: No focal deficit present.     Mental Status: He is alert and oriented to person, place, and time.  Psychiatric:        Mood and Affect: Mood normal.        Behavior: Behavior normal.     LABS:   CBC    Component Value Date/Time   WBC 9.7 11/30/2023 0944   RBC 4.92 11/30/2023 0944   HGB 15.2 11/30/2023 0944   HCT 45.0 11/30/2023 0944   PLT 239 11/30/2023 0944   MCV 91.5 11/30/2023 0944   MCH  30.9 11/30/2023 0944   MCHC 33.8 11/30/2023 0944   RDW 12.5 11/30/2023 0944   LYMPHSABS 1.7 11/30/2023 0944   MONOABS 0.9 11/30/2023 0944   EOSABS 0.4 11/30/2023 0944   BASOSABS 0.0 11/30/2023 0944    CMP    Component Value Date/Time   NA 135 11/30/2023 0944   K 3.4 (L) 11/30/2023 0944   CL  99 11/30/2023 0944   CO2 26 11/30/2023 0944   GLUCOSE 100 (H) 11/30/2023 0944   BUN 15 11/30/2023 0944   CREATININE 0.84 11/30/2023 0944   CALCIUM 9.5 11/30/2023 0944   PROT 7.5 11/30/2023 0944   ALBUMIN 3.6 11/30/2023 0944   AST 18 11/30/2023 0944   ALT 17 11/30/2023 0944   ALKPHOS 78 11/30/2023 0944   BILITOT 0.5 11/30/2023 0944   GFRNONAA >60 11/30/2023 0944   GFRAA  01/08/2009 1449    >60        The eGFR has been calculated using the MDRD equation. This calculation has not been validated in all clinical situations. eGFR's persistently <60 mL/min signify possible Chronic Kidney Disease.     No results found for: "CEA1", "CEA" / No results found for: "CEA1", "CEA" No results found for: "PSA1" No results found for: "BJY782" No results found for: "CAN125"  No results found for: "TOTALPROTELP", "ALBUMINELP", "A1GS", "A2GS", "BETS", "BETA2SER", "GAMS", "MSPIKE", "SPEI" No results found for: "TIBC", "FERRITIN", "IRONPCTSAT" No results found for: "LDH"   STUDIES:   CT Super D Chest Wo Contrast Result Date: 11/27/2023 CLINICAL DATA:  Lung nodules. Concern for rim pulmonary metastasis. * Tracking Code: BO * EXAM: CT CHEST WITHOUT CONTRAST TECHNIQUE: Multidetector CT imaging of the chest was performed using thin slice collimation for electromagnetic bronchoscopy planning purposes, without intravenous contrast. RADIATION DOSE REDUCTION: This exam was performed according to the departmental dose-optimization program which includes automated exposure control, adjustment of the mA and/or kV according to patient size and/or use of iterative reconstruction technique. COMPARISON:  FDG PET scan 10/19/2023 FINDINGS: Cardiovascular: No significant vascular findings. Normal heart size. No pericardial effusion. Mediastinum/Nodes: Interval increase in mediastinal adenopathy. Prevascular nodes in the high LEFT mediastinum measuring 1.7 cm on image 60 are new from prior. Again demonstrated  paratracheal nodes which are hypermetabolic on comparison CT. Lungs/Pleura: Bilateral pulmonary nodules of varying size. Largest nodule in the RIGHT upper lobe measures 2.5 x 2.6 cm compared to 2.5 by 2.7 cm on comparison PET-CT for no interval change. Additional small bilateral pulmonary nodules are present within LEFT and RIGHT lungs and not significantly change. Upper Abdomen: Limited view of the liver, kidneys, pancreas are unremarkable. Normal adrenal glands. Musculoskeletal: No aggressive osseous lesion. IMPRESSION: 1. Bilateral pulmonary nodules which are hypermetabolic on comparison FDG PET scan. Largest nodule RIGHT upper lobe as described. 2. Interval progression of mediastinal lymphadenopathy compared to recent PET-CT scan. Electronically Signed   By: Deboraha Fallow M.D.   On: 11/27/2023 13:49   DG Chest Port 1 View Result Date: 11/21/2023 CLINICAL DATA:  Status post bronchoscopy with biopsy. EXAM: PORTABLE CHEST 1 VIEW COMPARISON:  PET scan of October 19, 2023. FINDINGS: The heart size and mediastinal contours are within normal limits. No pneumothorax or pleural effusion is noted. Left lung is clear. Right upper lobe atelectasis is noted which most likely is secondary to malignancy. The visualized skeletal structures are unremarkable. IMPRESSION: No definite pneumothorax is noted status post bronchoscopy with biopsy. Right upper lobe atelectasis is noted most likely secondary to malignancy. Electronically Signed   By: Collie Days  Jr M.D.   On: 11/21/2023 12:53   DG C-ARM BRONCHOSCOPY Result Date: 11/21/2023 C-ARM BRONCHOSCOPY: Fluoroscopy was utilized by the requesting physician.  No radiographic interpretation.

## 2023-12-04 ENCOUNTER — Encounter: Payer: Self-pay | Admitting: Acute Care

## 2023-12-04 NOTE — Telephone Encounter (Signed)
 It should not be needed since one is already scheduled and ordered by the oncologist

## 2023-12-04 NOTE — Progress Notes (Signed)
 The proposed treatment discussed in conference is for discussion purpose only and is not a binding recommendation.  The patients have not been physically examined, or presented with their treatment options.  Therefore, final treatment plans cannot be decided.

## 2023-12-04 NOTE — Telephone Encounter (Signed)
 Closing encounter no longer needing MRI already has one with oncologist. NFN

## 2023-12-05 NOTE — Telephone Encounter (Signed)
 Copied from CRM [id number]. Topic: Clinical - Medical Advice >> Dec 04, 2023 10:48 AM Justina Oman C wrote: Reason for CRM: Patient states oncology ordered a MRI and is scheduled for 12/06/23 and got a message from [facility] for pulmonary to have another MRI, patient needs clarification on why he needs a two MRI?  Please advise [phone].  Spoke with patient regarding prior message.Advised patient to keep his MRI scheduled for tomorrow with oncology. Patient stated he has spoke with someone yesterday regarding this message .

## 2023-12-06 ENCOUNTER — Telehealth: Payer: Self-pay | Admitting: *Deleted

## 2023-12-06 ENCOUNTER — Ambulatory Visit (HOSPITAL_COMMUNITY)
Admission: RE | Admit: 2023-12-06 | Discharge: 2023-12-06 | Disposition: A | Discharge status: Home / Self Care | Source: Ambulatory Visit | Attending: Hematology | Admitting: Hematology

## 2023-12-06 ENCOUNTER — Encounter: Payer: Self-pay | Admitting: *Deleted

## 2023-12-06 DIAGNOSIS — C3490 Malignant neoplasm of unspecified part of unspecified bronchus or lung: Secondary | ICD-10-CM | POA: Insufficient documentation

## 2023-12-06 MED ORDER — GADOBUTROL 1 MMOL/ML IV SOLN
10.0000 mL | Freq: Once | INTRAVENOUS | Status: AC | PRN
  Administered 2023-12-06: 10 mL via INTRAVENOUS

## 2023-12-06 NOTE — Progress Notes (Deleted)
 Received call report from [facility] Radiology regarding abnormal MRI brain.  Dr. Cheree Cords aware.

## 2023-12-06 NOTE — Telephone Encounter (Addendum)
 Per Dr. Cheree Cords, patient made aware of MRI brain results.  Advised of potential old CVA, however provided symptoms of a stroke and advised he call 911 if he should develop any symptoms.  Dr. Cheree Cords will discuss results in detail at follow up visit.  Patient verbalized understanding.

## 2023-12-08 NOTE — Progress Notes (Signed)
 Thoracic Location of Tumor / Histology: Right Upper Lobe Lung cancer with mets to bone.   Patient presented to pulmonary in March 2025 after imaging from December 2024 in [place] showed a spiculated lung lesion of 4.3 x 3.5 cm with satellite lesions and smaller bilateral lung nodules.  MRI Brain 12/06/2023: Small foci of diffusion-weighted signal abnormality within the anterior right frontal lobe, right parietooccipital lobes, left parietal lobe and right cerebellar hemisphere.  There is no appreciable corresponding pathologic enhancement at these sites, and these likely reflect acute/subacute infarcts. However, a short-interval follow-up brain MRI is recommended in 6 weeks to ensure expected evolution and to exclude poorly enhancing metastases.   Bronchoscopy 11/21/2023  PET 10/19/2023: Findings are most consistent with metastatic lung cancer. There are multiple hypermetabolic pulmonary nodules bilaterally, largest a spiculated nodule inferiorly in the right upper lobe which likely represents the primary malignancy.  Hypermetabolic mediastinal and hilar lymph nodes bilaterally, consistent with metastatic disease.  Hypermetabolic osseous lesions involving the sternum, left 6th rib and right iliac bone, consistent with metastatic disease.  No evidence of metastatic disease in the abdomen or pelvis.   Biopsies of Right Upper Lobe Lung & Lymph Nodes 11/21/2023     Past/Anticipated interventions by cardiothoracic surgery, if any:    Past/Anticipated interventions by medical oncology, if any:  Dr. Cheree Cords 11/30/2023 - We have reviewed images and pathology reports with the patient and his wife in detail. - Recommend MRI of the brain with and without contrast to complete staging. - We discussed the role of molecular testing involving PD-L1 and other targetable mutations on the tissue as well as blood testing with [employer]. - Recommend port placement and follow-up in 3 weeks to discuss further  plan. -Follow-up 12/21/2023   Tobacco/Marijuana/Snuff/ETOH use: Former smoker, quit about 10-12 years ago.  Signs/Symptoms Weight changes, if any: He reports about a 6 pound weight loss over the past  Respiratory complaints, if any: He reports more SOB with exertion, notes significant decline since January. Hemoptysis, if any: He reports occasional productive cough, mostly has dry hacky cough. Pain issues, if any:  Left side, states the pain is getting better today.  He is taking ibuprofen as needed, 5/10 at its worst.  He reports persistent coughing makes the pain worse.   SAFETY ISSUES: Prior radiation? No Pacemaker/ICD? No Possible current pregnancy? n/a Is the patient on methotrexate? No  Current Complaints / other details:   Port Insertion 12/14/2023

## 2023-12-11 NOTE — Progress Notes (Signed)
 Radiation Oncology         [phone] ________________________________  Name: Jose Joseph        MRN: [id number]  Date of Service: 12/12/2023 DOB: Jan 19, 1956  WU:[id number], Jose Moynahan, MD  Denson Flake, MD     REFERRING PHYSICIAN: Denson Flake, MD   DIAGNOSIS: The primary encounter diagnosis was Malignant neoplasm of upper lobe of right lung [facility]). A diagnosis of Secondary malignant neoplasm of bone [facility]) was also pertinent to this visit.   HISTORY OF PRESENT ILLNESS: ICHAEL Joseph is a 68 y.o. male seen at the request of Dr. Baldwin Levee for a diagnosis of metastatic lung cancer.  The patient presented in December 2024 while he had been vacationing in [place] with shortness of breath and was found to have an approximately 4.3 cm tumor in the right lung with satellite lesions and smaller bilateral pulmonary nodules.  He discussed with his PCP who contacted Dr. Baldwin Levee and the patient was seen and began workup within the pulmonary clinic.  He underwent a PET scan on 10/19/2023 that showed multiple hypermetabolic pulmonary nodules the largest being in the right upper lobe measuring 3.2 cm with smaller nodules bilaterally with hypermetabolic activity for size as well as mediastinal and hilar lymphadenopathy, these areas had SUVs ranging from 9.1-10.8.  In addition hypermetabolic lesions were noted in the inferior sternum, left sixth rib, right iliac bone and he underwent a bronchoscopy on 11/21/2023 with fine-needle aspirate of the right upper lobe nodule showing adenocarcinoma.  He underwent an MRI of the brain with and without contrast on 12/06/2023 which showed small foci of diffusion weighted signal abnormality within the anterior right frontal lobe right parietal occipital lobes left parietal lobe and right cerebellar hemisphere measuring up to 5 mm and no pathologic enhancement at the sites was appreciated so they are favored to be acute/subacute infarcts and it was recommended that interval follow-up in 6 weeks  be considered.  This has been ordered.  He met with Dr. Cheree Cords to discuss these results and to consider systemic treatment.  He is scheduled for Port-A-Cath placement on Thursday of this week, and will begin therapy once there is additional results from [employer] testing.  He is seen to consider a palliative course of radiotherapy due to pain.     PREVIOUS RADIATION THERAPY: No   PAST MEDICAL HISTORY:  Past Medical History:  Diagnosis Date   Arthritis    Asthma    Complication of anesthesia    after his neck surgery pt. trying to get up and walk out   Depression    Dyspnea    when he was smoking   History of kidney stones    2021   Pneumonia    during COVID in 2021       PAST SURGICAL HISTORY: Past Surgical History:  Procedure Laterality Date   BRONCHIAL BIOPSY  11/21/2023   Procedure: BRONCHOSCOPY, WITH BIOPSY;  Surgeon: Denson Flake, MD;  Location: [facility] ENDOSCOPY;  Service: Pulmonary;;   BRONCHIAL NEEDLE ASPIRATION BIOPSY  11/21/2023   Procedure: BRONCHOSCOPY, WITH NEEDLE ASPIRATION BIOPSY;  Surgeon: Denson Flake, MD;  Location: [facility] ENDOSCOPY;  Service: Pulmonary;;   COLONOSCOPY WITH PROPOFOL  N/A 01/19/2023   Procedure: COLONOSCOPY WITH PROPOFOL ;  Surgeon: Suzette Espy, MD;  Location: [facility] ENDO SUITE;  Service: Endoscopy;  Laterality: N/A;  200pm, asa 2   ENDOBRONCHIAL ULTRASOUND Bilateral 11/21/2023   Procedure: ENDOBRONCHIAL ULTRASOUND (EBUS);  Surgeon: Denson Flake, MD;  Location[facility] ENDOSCOPY;  Service: Pulmonary;  Laterality: Bilateral;   KNEE ARTHROCENTESIS     NECK SURGERY     POLYPECTOMY  01/19/2023   Procedure: POLYPECTOMY;  Surgeon: Suzette Espy, MD;  Location: [facility] ENDO SUITE;  Service: Endoscopy;;   TOTAL HIP ARTHROPLASTY Left 01/15/2021   Procedure: LEFT TOTAL HIP ARTHROPLASTY ANTERIOR APPROACH;  Surgeon: Adah Acron, MD;  Location: [facility] OR;  Service: Orthopedics;  Laterality: Left;   VIDEO BRONCHOSCOPY WITH ENDOBRONCHIAL NAVIGATION Bilateral 11/21/2023    Procedure: VIDEO BRONCHOSCOPY WITH ENDOBRONCHIAL NAVIGATION;  Surgeon: Denson Flake, MD;  Location[facility] ENDOSCOPY;  Service: Pulmonary;  Laterality: Bilateral;   WRIST ARTHROPLASTY       FAMILY HISTORY:  Family History  Problem Relation Age of Onset   Kidney cancer Sister    Prostate cancer Brother      SOCIAL HISTORY:  reports that he quit smoking about 11 years ago. His smoking use included cigarettes. He started smoking about 46 years ago. He has a 35 pack-year smoking history. He has been exposed to tobacco smoke. He has quit using smokeless tobacco. He reports that he does not currently use alcohol after a past usage of about 2.0 standard drinks of alcohol per week. He reports current drug use. Drug: Marijuana.The patient is married and accompanied by his wife and son. 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 He and his wife spend several months of each year in her native [place] and go back intermittently for their son's medical care.    ALLERGIES: Bee venom   MEDICATIONS:  Current Outpatient Medications  Medication Sig Dispense Refill   albuterol  (VENTOLIN  HFA) 108 (90 Base) MCG/ACT inhaler Inhale 2 puffs into the lungs every 6 (six) hours as needed for wheezing or shortness of breath. 8 g 2   budeson-glycopyrrolate-formoterol (BREZTRI  AEROSPHERE) 160-9-4.8 MCG/ACT AERO inhaler Inhale 2 puffs into the lungs in the morning and at bedtime.     cetirizine (ZYRTEC) 10 MG tablet Take 10 mg by mouth in the morning.     citalopram  (CELEXA ) 20 MG tablet Take 20 mg by mouth in the morning.     fexofenadine (ALLEGRA) 180 MG tablet Take 180 mg by mouth in the morning.     Fluticasone-Umeclidin-Vilant (TRELEGY ELLIPTA ) 100-62.5-25 MCG/ACT AEPB Inhale 1 puff into the lungs daily. 1 each 6   Fluticasone-Umeclidin-Vilant (TRELEGY ELLIPTA ) 100-62.5-25 MCG/ACT AEPB Inhale 1 puff into the lungs daily at 2 PM.     No current facility-administered medications for this  encounter.     REVIEW OF SYSTEMS: On review of systems, the patient reports that he is doing okay. He describes increasing fatigue, and about 6 pounds of unintended weight loss from poor appetite. He has also significantly struggled in the last month and a half with feeling less robust physically. He and his wife care for their adult son with disabilities and Mr. Mial helps carry his son in and out of the house but is finding this very difficult to navigate due to less strength and stamina, as well as feeling more short of breath with less exertion. He describes pain in his left chest wall, and at the base of the sternum. His symptoms are worsened with coughing, and and with trying to rest laying down. He has to hold his ribcage out of pain with strenuous activities. He also describes more difficulty coughing up mucous and he has been using his albuterol  inhaler twice a day as well as samples of trelegy, but will  move to Breztrizi after a few more days of trelegy samples. He feels like he cannot catch his breath when he's more short winded. He denies any hemoptysis. No other complaints are verbalized.    PHYSICAL EXAM:  Wt Readings from Last 3 Encounters:  12/12/23 264 lb (119.7 kg)  11/30/23 265 lb (120.2 kg)  11/29/23 266 lb 6.4 oz (120.8 kg)   Temp Readings from Last 3 Encounters:  12/12/23 97.6 F (36.4 C)  11/30/23 (!) 97.5 F (36.4 C) (Tympanic)  11/21/23 97.6 F (36.4 C)   BP Readings from Last 3 Encounters:  12/12/23 (!) 153/97  11/30/23 100/83  11/29/23 134/85   Pulse Readings from Last 3 Encounters:  12/12/23 (!) 110  11/30/23 92  11/29/23 79   Pain Assessment Pain Score: 3 /10  In general this is a tired appearing caucasian male in no acute distress. He's alert and oriented x4 and appropriate throughout the examination. Cardiopulmonary assessment is negative for acute distress but he exerts more effort and breathing while seated and talking during our encounter.      ECOG = 1  0 - Asymptomatic (Fully active, able to carry on all predisease activities without restriction)  1 - Symptomatic but completely ambulatory (Restricted in physically strenuous activity but ambulatory and able to carry out work of a light or sedentary nature. For example, light housework, office work)  2 - Symptomatic, <50% in bed during the day (Ambulatory and capable of all self care but unable to carry out any work activities. Up and about more than 50% of waking hours)  3 - Symptomatic, >50% in bed, but not bedbound (Capable of only limited self-care, confined to bed or chair 50% or more of waking hours)  4 - Bedbound (Completely disabled. Cannot carry on any self-care. Totally confined to bed or chair)  5 - Death   Aurea Blossom MM, Creech RH, Tormey DC, et al. [phone]). "Toxicity and response criteria of the [facility]". Am. Hillard Lowes. Oncol. 5 (6): 649-55    LABORATORY DATA:  Lab Results  Component Value Date   WBC 9.7 11/30/2023   HGB 15.2 11/30/2023   HCT 45.0 11/30/2023   MCV 91.5 11/30/2023   PLT 239 11/30/2023   Lab Results  Component Value Date   NA 135 11/30/2023   K 3.4 (L) 11/30/2023   CL 99 11/30/2023   CO2 26 11/30/2023   Lab Results  Component Value Date   ALT 17 11/30/2023   AST 18 11/30/2023   ALKPHOS 78 11/30/2023   BILITOT 0.5 11/30/2023      RADIOGRAPHY: MR Brain W Wo Contrast Result Date: 12/06/2023 CLINICAL DATA:  Provided history: Malignant neoplasm of unspecified part of unspecified bronchus or lung. Non-small cell lung cancer, staging. EXAM: MRI HEAD WITHOUT AND WITH CONTRAST TECHNIQUE: Multiplanar, multiecho pulse sequences of the brain and surrounding structures were obtained without and with intravenous contrast. CONTRAST:  10mL GADAVIST  GADOBUTROL  1 MMOL/ML IV SOLN COMPARISON:  PET CT 10/19/2023. FINDINGS: Brain: Mild generalized cerebral atrophy. Punctate focus of restricted diffusion within the anterior right  frontal lobe without appreciable corresponding enhancement (series 6, image 28). Multiple small foci of diffusion-weighted signal abnormality within the medial right frontoparietal cortex measuring up to 5 mm (for instance as seen on series 6, image 28). No corresponding enhancement appreciated. Punctate focus of diffusion-weighted signal abnormality within the left parietal lobe white matter without appreciable corresponding enhancement (series 6, image 33). Punctate focus of diffusion-weighted signal abnormality within the right  cerebellar hemisphere without appreciable corresponding enhancement (series 6, image 13). Background multifocal T2 FLAIR hyperintense signal abnormality within the cerebral white matter (moderate) and pons (mild). Findings are nonspecific, but most often secondary to chronic small vessel ischemia. No chronic intracranial blood products. No extra-axial fluid collection. No midline shift. Vascular: Maintained flow voids within the proximal large arterial vessels. Skull and upper cervical spine: No focal worrisome marrow lesion. Sinuses/Orbits: No mass or acute finding within the imaged orbits. 10 mm mucous retention cyst within the left maxillary sinus. Impression #1 will be called to the ordering clinician or representative by the Radiologist Assistant, and communication documented in the PACS or [employer]. IMPRESSION: 1. Small foci of diffusion-weighted signal abnormality within the anterior right frontal lobe, right parietooccipital lobes, left parietal lobe and right cerebellar hemisphere (measuring up to 5 mm). There is no appreciable corresponding pathologic enhancement at these sites, and these likely reflect acute/subacute infarcts (involvement of multiple vascular territories concerning for an embolic process). However, a short-interval follow-up brain MRI is recommended in 6 weeks to ensure expected evolution and to exclude poorly enhancing metastases. 2. Background T2 FLAIR  hyperintense signal changes within the cerebral white matter (moderate) and pons (mild), nonspecific but most often secondary to chronic small vessel ischemia. 3. 10 mm left maxillary sinus mucous retention cyst. Electronically Signed   By: Bascom Lily D.O.   On: 12/06/2023 13:04   CT Super D Chest Wo Contrast Result Date: 11/27/2023 CLINICAL DATA:  Lung nodules. Concern for rim pulmonary metastasis. * Tracking Code: BO * EXAM: CT CHEST WITHOUT CONTRAST TECHNIQUE: Multidetector CT imaging of the chest was performed using thin slice collimation for electromagnetic bronchoscopy planning purposes, without intravenous contrast. RADIATION DOSE REDUCTION: This exam was performed according to the departmental dose-optimization program which includes automated exposure control, adjustment of the mA and/or kV according to patient size and/or use of iterative reconstruction technique. COMPARISON:  FDG PET scan 10/19/2023 FINDINGS: Cardiovascular: No significant vascular findings. Normal heart size. No pericardial effusion. Mediastinum/Nodes: Interval increase in mediastinal adenopathy. Prevascular nodes in the high LEFT mediastinum measuring 1.7 cm on image 60 are new from prior. Again demonstrated paratracheal nodes which are hypermetabolic on comparison CT. Lungs/Pleura: Bilateral pulmonary nodules of varying size. Largest nodule in the RIGHT upper lobe measures 2.5 x 2.6 cm compared to 2.5 by 2.7 cm on comparison PET-CT for no interval change. Additional small bilateral pulmonary nodules are present within LEFT and RIGHT lungs and not significantly change. Upper Abdomen: Limited view of the liver, kidneys, pancreas are unremarkable. Normal adrenal glands. Musculoskeletal: No aggressive osseous lesion. IMPRESSION: 1. Bilateral pulmonary nodules which are hypermetabolic on comparison FDG PET scan. Largest nodule RIGHT upper lobe as described. 2. Interval progression of mediastinal lymphadenopathy compared to recent  PET-CT scan. Electronically Signed   By: Deboraha Fallow M.D.   On: 11/27/2023 13:49   DG Chest Port 1 View Result Date: 11/21/2023 CLINICAL DATA:  Status post bronchoscopy with biopsy. EXAM: PORTABLE CHEST 1 VIEW COMPARISON:  PET scan of October 19, 2023. FINDINGS: The heart size and mediastinal contours are within normal limits. No pneumothorax or pleural effusion is noted. Left lung is clear. Right upper lobe atelectasis is noted which most likely is secondary to malignancy. The visualized skeletal structures are unremarkable. IMPRESSION: No definite pneumothorax is noted status post bronchoscopy with biopsy. Right upper lobe atelectasis is noted most likely secondary to malignancy. Electronically Signed   By: Rosalene Colon M.D.   On: 11/21/2023 12:53  DG C-ARM BRONCHOSCOPY Result Date: 11/21/2023 C-ARM BRONCHOSCOPY: Fluoroscopy was utilized by the requesting physician.  No radiographic interpretation.       IMPRESSION/PLAN: 1. Stage IV, cT4N3M1, NSCLC, adenocarcinoma of the RUL with bone metastases. Dr. Jeryl Moris discusses the pathology findings and reviews the nature of metastatic lung cancer.  In reviewing the patient's symptoms, clinically he has pain at the site of the left sixth rib, the sternum, and he is having increased difficulty with breathing and doing his usual activities.  Dr. Jeryl Moris discusses the rationale for considering radiation to the rib, and that since it has been almost 2 months since his PET scan, we could consider radiotherapy to the lung if his simulation CT scan in our department shows concern for more narrowing of the airways or risk of a collapsed lobe.  We have also reached out to Dr. Katragadda for clarity on when the patient would be starting systemic treatment which would influence the duration of his radiation.  If we were to just treat the rib, Dr. Jeryl Moris would anticipate 5 fractions of radiation over 1 week.  If his lung is to be treated, we could give 10 treatments prior to  starting systemic chemo or if he were to begin treatment within the next 2 weeks, consider 3 weeks of radiation.  We discussed the risks, benefits, short, and long term effects of radiotherapy, as well as the palliative intent, and the patient is interested in proceeding. Dr. Jeryl Moris discusses the delivery and logistics of radiotherapy. Written consent is obtained and placed in the chart, a copy was provided to the patient.  He will simulate tomorrow at 1:00 pm. 2. Diffusion weighted abnormalities on brain MRI.  We will follow-up with the results of his upcoming repeat scan that has been ordered by pulmonary medicine.  In a visit lasting 60 minutes, greater than 50% of the time was spent face to face discussing the patient's condition, in preparation for the discussion, and coordinating the patient's care.   The above documentation reflects my direct findings during this shared patient visit. Please see the separate note by Dr. Jeryl Moris on this date for the remainder of the patient's plan of care.    Shelvia Dick, [facility]   **Disclaimer: This note was dictated with voice recognition software. Similar sounding words can inadvertently be transcribed and this note may contain transcription errors which may not have been corrected upon publication of note.**

## 2023-12-12 ENCOUNTER — Ambulatory Visit
Admission: RE | Admit: 2023-12-12 | Discharge: 2023-12-12 | Disposition: A | Discharge status: Home / Self Care | Source: Ambulatory Visit | Attending: Radiation Oncology | Admitting: Radiation Oncology

## 2023-12-12 ENCOUNTER — Other Ambulatory Visit: Payer: Self-pay | Admitting: Radiation Oncology

## 2023-12-12 ENCOUNTER — Encounter: Payer: Self-pay | Admitting: Radiation Oncology

## 2023-12-12 VITALS — BP 153/97 | HR 110 | Temp 97.6°F | Resp 24 | Ht 73.0 in | Wt 264.0 lb

## 2023-12-12 DIAGNOSIS — Z8616 Personal history of COVID-19: Secondary | ICD-10-CM | POA: Diagnosis not present

## 2023-12-12 DIAGNOSIS — C3411 Malignant neoplasm of upper lobe, right bronchus or lung: Secondary | ICD-10-CM | POA: Diagnosis not present

## 2023-12-12 DIAGNOSIS — M129 Arthropathy, unspecified: Secondary | ICD-10-CM | POA: Diagnosis not present

## 2023-12-12 DIAGNOSIS — Z79899 Other long term (current) drug therapy: Secondary | ICD-10-CM | POA: Insufficient documentation

## 2023-12-12 DIAGNOSIS — Z8042 Family history of malignant neoplasm of prostate: Secondary | ICD-10-CM | POA: Diagnosis not present

## 2023-12-12 DIAGNOSIS — Z8701 Personal history of pneumonia (recurrent): Secondary | ICD-10-CM | POA: Diagnosis not present

## 2023-12-12 DIAGNOSIS — J45909 Unspecified asthma, uncomplicated: Secondary | ICD-10-CM | POA: Insufficient documentation

## 2023-12-12 DIAGNOSIS — R0602 Shortness of breath: Secondary | ICD-10-CM | POA: Insufficient documentation

## 2023-12-12 DIAGNOSIS — Z87891 Personal history of nicotine dependence: Secondary | ICD-10-CM | POA: Insufficient documentation

## 2023-12-12 DIAGNOSIS — C7951 Secondary malignant neoplasm of bone: Secondary | ICD-10-CM | POA: Insufficient documentation

## 2023-12-12 DIAGNOSIS — Z87442 Personal history of urinary calculi: Secondary | ICD-10-CM | POA: Insufficient documentation

## 2023-12-12 DIAGNOSIS — Z8052 Family history of malignant neoplasm of bladder: Secondary | ICD-10-CM | POA: Diagnosis not present

## 2023-12-13 ENCOUNTER — Other Ambulatory Visit: Payer: Self-pay | Admitting: Radiology

## 2023-12-13 ENCOUNTER — Ambulatory Visit (HOSPITAL_COMMUNITY)

## 2023-12-13 ENCOUNTER — Ambulatory Visit
Admission: RE | Admit: 2023-12-13 | Discharge: 2023-12-13 | Disposition: A | Discharge status: Home / Self Care | Source: Ambulatory Visit | Attending: Radiation Oncology | Admitting: Radiation Oncology

## 2023-12-13 DIAGNOSIS — C3411 Malignant neoplasm of upper lobe, right bronchus or lung: Secondary | ICD-10-CM | POA: Diagnosis not present

## 2023-12-13 DIAGNOSIS — Z87891 Personal history of nicotine dependence: Secondary | ICD-10-CM | POA: Diagnosis not present

## 2023-12-13 DIAGNOSIS — C7951 Secondary malignant neoplasm of bone: Secondary | ICD-10-CM | POA: Diagnosis present

## 2023-12-14 ENCOUNTER — Encounter (HOSPITAL_COMMUNITY): Payer: Self-pay

## 2023-12-14 ENCOUNTER — Ambulatory Visit (HOSPITAL_COMMUNITY)
Admission: RE | Admit: 2023-12-14 | Discharge: 2023-12-14 | Disposition: A | Discharge status: Home / Self Care | Source: Ambulatory Visit | Attending: Hematology | Admitting: Hematology

## 2023-12-14 ENCOUNTER — Ambulatory Visit: Admitting: Radiation Oncology

## 2023-12-14 ENCOUNTER — Ambulatory Visit: Payer: Self-pay | Admitting: Acute Care

## 2023-12-14 ENCOUNTER — Other Ambulatory Visit: Payer: Self-pay

## 2023-12-14 ENCOUNTER — Telehealth: Payer: Self-pay | Admitting: Radiation Oncology

## 2023-12-14 DIAGNOSIS — C3491 Malignant neoplasm of unspecified part of right bronchus or lung: Secondary | ICD-10-CM | POA: Insufficient documentation

## 2023-12-14 DIAGNOSIS — Z87891 Personal history of nicotine dependence: Secondary | ICD-10-CM | POA: Diagnosis not present

## 2023-12-14 MED ORDER — HEPARIN SOD (PORK) LOCK FLUSH 100 UNIT/ML IV SOLN
INTRAVENOUS | Status: AC
  Filled 2023-12-14: qty 5

## 2023-12-14 MED ORDER — LIDOCAINE-EPINEPHRINE 1 %-1:100000 IJ SOLN
INTRAMUSCULAR | Status: AC
  Filled 2023-12-14: qty 1

## 2023-12-14 MED ORDER — LIDOCAINE HCL 1 % IJ SOLN: 20.0000 mL | Freq: Once | INTRAMUSCULAR | Status: DC

## 2023-12-14 MED ORDER — MIDAZOLAM HCL 2 MG/2ML IJ SOLN
INTRAMUSCULAR | Status: AC | PRN
  Administered 2023-12-14: 1 mg via INTRAVENOUS

## 2023-12-14 MED ORDER — HEPARIN SOD (PORK) LOCK FLUSH 100 UNIT/ML IV SOLN: 500.0000 [IU] | Freq: Once | INTRAVENOUS | Status: DC

## 2023-12-14 MED ORDER — FENTANYL CITRATE (PF) 100 MCG/2ML IJ SOLN
INTRAMUSCULAR | Status: AC | PRN
  Administered 2023-12-14: 50 ug via INTRAVENOUS

## 2023-12-14 MED ORDER — MIDAZOLAM HCL 2 MG/2ML IJ SOLN
INTRAMUSCULAR | Status: AC
  Filled 2023-12-14: qty 2

## 2023-12-14 MED ORDER — FENTANYL CITRATE (PF) 100 MCG/2ML IJ SOLN
INTRAMUSCULAR | Status: AC
  Filled 2023-12-14: qty 2

## 2023-12-14 NOTE — Consult Note (Signed)
 Chief Complaint: Patient was seen in consultation today for Lung Cancer treatment--- Port a cath placement at the request of Katragadda,Sreedhar  Referring Physician(s): Katragadda,Sreedhar  Supervising Physician: Myrlene Asper  Patient Status: [facility] - Out-pt  History of Present Illness: Jose Joseph is a 68 y.o. male   FULL Code status per pt Recent Dx Lung Ca--- with Bone mets; + brain lesion per MRI Follows with Dr Cheree Cords To start Radiation Tx 5/5 Chemo next week per pt  Scheduled for [facility] a cath placement in IR  Past Medical History:  Diagnosis Date   Arthritis    Asthma    Complication of anesthesia    after his neck surgery pt. trying to get up and walk out   Depression    Dyspnea    when he was smoking   History of kidney stones    2021   Pneumonia    during COVID in 2021    Past Surgical History:  Procedure Laterality Date   BRONCHIAL BIOPSY  11/21/2023   Procedure: BRONCHOSCOPY, WITH BIOPSY;  Surgeon: Denson Flake, MD;  Location: [facility] ENDOSCOPY;  Service: Pulmonary;;   BRONCHIAL NEEDLE ASPIRATION BIOPSY  11/21/2023   Procedure: BRONCHOSCOPY, WITH NEEDLE ASPIRATION BIOPSY;  Surgeon: Denson Flake, MD;  Location: [facility] ENDOSCOPY;  Service: Pulmonary;;   COLONOSCOPY WITH PROPOFOL  N/A 01/19/2023   Procedure: COLONOSCOPY WITH PROPOFOL ;  Surgeon: Suzette Espy, MD;  Location: [facility] ENDO SUITE;  Service: Endoscopy;  Laterality: N/A;  200pm, asa 2   ENDOBRONCHIAL ULTRASOUND Bilateral 11/21/2023   Procedure: ENDOBRONCHIAL ULTRASOUND (EBUS);  Surgeon: Denson Flake, MD;  Location: [facility] ENDOSCOPY;  Service: Pulmonary;  Laterality: Bilateral;   KNEE ARTHROCENTESIS     NECK SURGERY     POLYPECTOMY  01/19/2023   Procedure: POLYPECTOMY;  Surgeon: Suzette Espy, MD;  Location: [facility] ENDO SUITE;  Service: Endoscopy;;   TOTAL HIP ARTHROPLASTY Left 01/15/2021   Procedure: LEFT TOTAL HIP ARTHROPLASTY ANTERIOR APPROACH;  Surgeon: Adah Acron, MD;  Location: [facility] OR;  Service:  Orthopedics;  Laterality: Left;   VIDEO BRONCHOSCOPY WITH ENDOBRONCHIAL NAVIGATION Bilateral 11/21/2023   Procedure: VIDEO BRONCHOSCOPY WITH ENDOBRONCHIAL NAVIGATION;  Surgeon: Denson Flake, MD;  Location: [facility] ENDOSCOPY;  Service: Pulmonary;  Laterality: Bilateral;   WRIST ARTHROPLASTY      Allergies: Bee venom  Medications: Prior to Admission medications   Medication Sig Start Date End Date Taking? Authorizing Provider  albuterol  (VENTOLIN  HFA) 108 (90 Base) MCG/ACT inhaler Inhale 2 puffs into the lungs every 6 (six) hours as needed for wheezing or shortness of breath. 10/16/23  Yes Raejean Bullock, NP  budeson-glycopyrrolate-formoterol (BREZTRI  AEROSPHERE) 160-9-4.8 MCG/ACT AERO inhaler Inhale 2 puffs into the lungs in the morning and at bedtime. 11/29/23  Yes Raejean Bullock, NP  cetirizine (ZYRTEC) 10 MG tablet Take 10 mg by mouth in the morning.   Yes [provider]  citalopram  (CELEXA ) 20 MG tablet Take 20 mg by mouth in the morning.   Yes [provider]  fexofenadine (ALLEGRA) 180 MG tablet Take 180 mg by mouth in the morning.   Yes [provider]  Fluticasone-Umeclidin-Vilant (TRELEGY ELLIPTA ) 100-62.5-25 MCG/ACT AEPB Inhale 1 puff into the lungs daily. 11/10/23  Yes Raejean Bullock, NP  Fluticasone-Umeclidin-Vilant (TRELEGY ELLIPTA ) 100-62.5-25 MCG/ACT AEPB Inhale 1 puff into the lungs daily at 2 PM. 11/10/23  Yes Raejean Bullock, NP     Family History  Problem Relation Age of Onset   Kidney cancer Sister  Prostate cancer Brother     Social History   Socioeconomic History   Marital status: Married    Spouse name: Not on file   Number of children: 2   Years of education: Not on file   Highest education level: Not on file  Occupational History   Occupation: retired  Tobacco Use   Smoking status: Former    Current packs/day: 0.00    Average packs/day: 1 pack/day for 35.0 years (35.0 ttl pk-yrs)    Types: Cigarettes    Start date: 23    Quit  date: 2014    Years since quitting: 11.3    Passive exposure: Past   Smokeless tobacco: Former  Substance and Sexual Activity   Alcohol use: Not Currently    Alcohol/week: 2.0 standard drinks of alcohol    Types: 2 Cans of beer per week   Drug use: Yes    Types: Marijuana    Comment: last: 1 week ago   Sexual activity: Not on file  Other Topics Concern   Not on file  Social History Narrative   Not on file   Social Drivers of Health   Financial Resource Strain: Not on file  Food Insecurity: No Food Insecurity (12/12/2023)   Hunger Vital Sign    Worried About Running Out of Food in the Last Year: Never true    Ran Out of Food in the Last Year: Never true  Transportation Needs: No Transportation Needs (12/12/2023)   PRAPARE - Administrator, Civil Service (Medical): No    Lack of Transportation (Non-Medical): No  Physical Activity: Not on file  Stress: Not on file  Social Connections: Not on file    Review of Systems: A 12 point ROS discussed and pertinent positives are indicated in the HPI above.  All other systems are negative.  Review of Systems  Constitutional:  Negative for activity change, appetite change and fever.  Respiratory:  Negative for cough and shortness of breath.   Cardiovascular:  Negative for chest pain.  Gastrointestinal:  Negative for abdominal pain and nausea.  Musculoskeletal:  Negative for gait problem.  Neurological:  Negative for weakness.  Psychiatric/Behavioral:  Negative for behavioral problems and confusion.     Vital Signs: BP (!) 151/88   Pulse 97   Temp 98.5 F (36.9 C) (Oral)   Resp 18   Ht 6\' 1"  (1.854 m)   Wt 264 lb (119.7 kg)   SpO2 94%   BMI 34.83 kg/m   Advance Care Plan: The advanced care plan/surrogate decision maker was discussed at the time of visit and documented in the medical record.    Physical Exam Vitals reviewed.  HENT:     Mouth/Throat:     Mouth: Mucous membranes are moist.  Cardiovascular:      Rate and Rhythm: Normal rate and regular rhythm.     Heart sounds: Normal heart sounds.  Pulmonary:     Effort: Pulmonary effort is normal.     Breath sounds: Normal breath sounds. No wheezing.  Abdominal:     Palpations: Abdomen is soft.  Musculoskeletal:        General: Normal range of motion.  Skin:    General: Skin is warm.  Neurological:     Mental Status: He is alert and oriented to person, place, and time.  Psychiatric:        Behavior: Behavior normal.     Imaging: MR Brain W Wo Contrast Result Date: 12/06/2023 CLINICAL DATA:  Provided history: Malignant neoplasm of unspecified part of unspecified bronchus or lung. Non-small cell lung cancer, staging. EXAM: MRI HEAD WITHOUT AND WITH CONTRAST TECHNIQUE: Multiplanar, multiecho pulse sequences of the brain and surrounding structures were obtained without and with intravenous contrast. CONTRAST:  10mL GADAVIST  GADOBUTROL  1 MMOL/ML IV SOLN COMPARISON:  PET CT 10/19/2023. FINDINGS: Brain: Mild generalized cerebral atrophy. Punctate focus of restricted diffusion within the anterior right frontal lobe without appreciable corresponding enhancement (series 6, image 28). Multiple small foci of diffusion-weighted signal abnormality within the medial right frontoparietal cortex measuring up to 5 mm (for instance as seen on series 6, image 28). No corresponding enhancement appreciated. Punctate focus of diffusion-weighted signal abnormality within the left parietal lobe white matter without appreciable corresponding enhancement (series 6, image 33). Punctate focus of diffusion-weighted signal abnormality within the right cerebellar hemisphere without appreciable corresponding enhancement (series 6, image 13). Background multifocal T2 FLAIR hyperintense signal abnormality within the cerebral white matter (moderate) and pons (mild). Findings are nonspecific, but most often secondary to chronic small vessel ischemia. No chronic intracranial blood  products. No extra-axial fluid collection. No midline shift. Vascular: Maintained flow voids within the proximal large arterial vessels. Skull and upper cervical spine: No focal worrisome marrow lesion. Sinuses/Orbits: No mass or acute finding within the imaged orbits. 10 mm mucous retention cyst within the left maxillary sinus. Impression #1 will be called to the ordering clinician or representative by the Radiologist Assistant, and communication documented in the PACS or [employer]. IMPRESSION: 1. Small foci of diffusion-weighted signal abnormality within the anterior right frontal lobe, right parietooccipital lobes, left parietal lobe and right cerebellar hemisphere (measuring up to 5 mm). There is no appreciable corresponding pathologic enhancement at these sites, and these likely reflect acute/subacute infarcts (involvement of multiple vascular territories concerning for an embolic process). However, a short-interval follow-up brain MRI is recommended in 6 weeks to ensure expected evolution and to exclude poorly enhancing metastases. 2. Background T2 FLAIR hyperintense signal changes within the cerebral white matter (moderate) and pons (mild), nonspecific but most often secondary to chronic small vessel ischemia. 3. 10 mm left maxillary sinus mucous retention cyst. Electronically Signed   By: Bascom Lily D.O.   On: 12/06/2023 13:04   CT Super D Chest Wo Contrast Result Date: 11/27/2023 CLINICAL DATA:  Lung nodules. Concern for rim pulmonary metastasis. * Tracking Code: BO * EXAM: CT CHEST WITHOUT CONTRAST TECHNIQUE: Multidetector CT imaging of the chest was performed using thin slice collimation for electromagnetic bronchoscopy planning purposes, without intravenous contrast. RADIATION DOSE REDUCTION: This exam was performed according to the departmental dose-optimization program which includes automated exposure control, adjustment of the mA and/or kV according to patient size and/or use of iterative  reconstruction technique. COMPARISON:  FDG PET scan 10/19/2023 FINDINGS: Cardiovascular: No significant vascular findings. Normal heart size. No pericardial effusion. Mediastinum/Nodes: Interval increase in mediastinal adenopathy. Prevascular nodes in the high LEFT mediastinum measuring 1.7 cm on image 60 are new from prior. Again demonstrated paratracheal nodes which are hypermetabolic on comparison CT. Lungs/Pleura: Bilateral pulmonary nodules of varying size. Largest nodule in the RIGHT upper lobe measures 2.5 x 2.6 cm compared to 2.5 by 2.7 cm on comparison PET-CT for no interval change. Additional small bilateral pulmonary nodules are present within LEFT and RIGHT lungs and not significantly change. Upper Abdomen: Limited view of the liver, kidneys, pancreas are unremarkable. Normal adrenal glands. Musculoskeletal: No aggressive osseous lesion. IMPRESSION: 1. Bilateral pulmonary nodules which are hypermetabolic on comparison FDG PET scan.  Largest nodule RIGHT upper lobe as described. 2. Interval progression of mediastinal lymphadenopathy compared to recent PET-CT scan. Electronically Signed   By: Deboraha Fallow M.D.   On: 11/27/2023 13:49   DG Chest Port 1 View Result Date: 11/21/2023 CLINICAL DATA:  Status post bronchoscopy with biopsy. EXAM: PORTABLE CHEST 1 VIEW COMPARISON:  PET scan of October 19, 2023. FINDINGS: The heart size and mediastinal contours are within normal limits. No pneumothorax or pleural effusion is noted. Left lung is clear. Right upper lobe atelectasis is noted which most likely is secondary to malignancy. The visualized skeletal structures are unremarkable. IMPRESSION: No definite pneumothorax is noted status post bronchoscopy with biopsy. Right upper lobe atelectasis is noted most likely secondary to malignancy. Electronically Signed   By: Rosalene Colon M.D.   On: 11/21/2023 12:53   DG C-ARM BRONCHOSCOPY Result Date: 11/21/2023 C-ARM BRONCHOSCOPY: Fluoroscopy was utilized by the  requesting physician.  No radiographic interpretation.    Labs:  CBC: Recent Labs    11/21/23 0728 11/30/23 0944  WBC 8.2 9.7  HGB 15.5 15.2  HCT 45.5 45.0  PLT 215 239    COAGS: No results for input(s): "INR", "APTT" in the last 8760 hours.  BMP: Recent Labs    11/30/23 0944  NA 135  K 3.4*  CL 99  CO2 26  GLUCOSE 100*  BUN 15  CALCIUM 9.5  CREATININE 0.84  GFRNONAA >60    LIVER FUNCTION TESTS: Recent Labs    11/30/23 0944  BILITOT 0.5  AST 18  ALT 17  ALKPHOS 78  PROT 7.5  ALBUMIN 3.6    TUMOR MARKERS: No results for input(s): "AFPTM", "CEA", "CA199", "CHROMGRNA" in the last 8760 hours.  Assessment and Plan:  Scheduled for Port a cath placement in IR Risks and benefits of image guided port-a-catheter placement was discussed with the patient including, but not limited to bleeding, infection, pneumothorax, or fibrin sheath development and need for additional procedures.  All of the patient's questions were answered, patient is agreeable to proceed. Consent signed and in chart.  Thank you for this interesting consult.  I greatly enjoyed meeting AURYN PACEK and look forward to participating in their care.  A copy of this report was sent to the requesting provider on this date.  Electronically Signed: Ellen Guppy, PA-C 12/14/2023, 10:26 AM   I spent a total of  30 Minutes   in face to face in clinical consultation, greater than 50% of which was counseling/coordinating care for [facility] a cath placement

## 2023-12-14 NOTE — Telephone Encounter (Signed)
 Chief Complaint: SOB  Symptoms: wheeze, cough Frequency: x few days Pertinent Negatives: Patient denies fever, URI sx Disposition: [] ED /[] Urgent Care (no appt availability in office) / [x] Appointment(In office/virtual)/ []  [place] [facility]/ [] Home Care/ [] Refused Recommended Disposition /[] [place] [facility]/ []  Follow-up with PCP Additional Notes: Pt c/o SOB, wheeze, and cough x a few days. Pt reports that he had a port placement procedure at the hospital today and they put him on O2. While on O2, he reported that his SOB improved, so has concerns on whether he needs O2. Pt also endorses using respiratory INH as prescribed, and albuterol  twice daily with some relief. Triager reinforced albuterol  usage, and instructed to use more frequently (as prescribed up to q 6 hrs) to ensure that he gets relief of sx until his acute appt tomorrow. Scheduled patient per protocol on 12/15/2023. Patient verbalized understanding and to call back/go to UC with worsening symptoms.     Copied from CRM [id number]. Topic: Clinical - Red Word Triage >> Dec 14, 2023  3:01 PM Crist Dominion wrote: Red Word that prompted transfer to Nurse Triage:  Trouble breathing Reason for Disposition  [1] MILD difficulty breathing (e.g., minimal/no SOB at rest, SOB with walking, pulse <100) AND [2] NEW-onset or WORSE than normal  Answer Assessment - Initial Assessment Questions E2C2 Pulmonary Triage - Initial Assessment Questions "Chief Complaint (e.g., cough, sob, wheezing, fever, chills, sweat or additional symptoms) *Go to specific symptom protocol after initial questions. SOB  "How long have symptoms been present?" X few days Reports   Have you tested for COVID or Flu? Note: If not, ask patient if a home test can be taken. If so, instruct patient to call back for positive results. No  MEDICINES:   "Have you used any OTC meds to help with symptoms?" No If yes, ask "What medications?" N/a  "Have you used your  inhalers/maintenance medication?" Yes If yes, "What medications?" Trelegy - 1 puff once daily Albuterol  PRN - takes 2x a day - some relief  If inhaler, ask "How many puffs and how often?" Note: Review instructions on medication in the chart. See above  OXYGEN: "Do you wear supplemental oxygen?" No If yes, "How many liters are you supposed to use?" N/a  "Do you monitor your oxygen levels?" No If yes, "What is your reading (oxygen level) today?" Does not have one  "What is your usual oxygen saturation reading?"  (Note: Pulmonary O2 sats should be 90% or greater) Reports 94% (?) with O2 during procedure today   1. RESPIRATORY STATUS: "Describe your breathing?" (e.g., wheezing, shortness of breath, unable to speak, severe coughing)      SOB, wheezing, productive clear coughing  3. PATTERN "Does the difficult breathing come and go, or has it been constant since it started?"      constant 4. SEVERITY: "How bad is your breathing?" (e.g., mild, moderate, severe)    - MILD: No SOB at rest, mild SOB with walking, speaks normally in sentences, can lie down, no retractions, pulse < 100.    - MODERATE: SOB at rest, SOB with minimal exertion and prefers to sit, cannot lie down flat, speaks in phrases, mild retractions, audible wheezing, pulse 100-120.    - SEVERE: Very SOB at rest, speaks in single words, struggling to breathe, sitting hunched forward, retractions, pulse > 120      moderate 5. RECURRENT SYMPTOM: "Have you had difficulty breathing before?" If Yes, ask: "When was the last time?" and "What happened that  time?"      First time 6. CARDIAC HISTORY: "Do you have any history of heart disease?" (e.g., heart attack, angina, bypass surgery, angioplasty)      denies 7. LUNG HISTORY: "Do you have any history of lung disease?"  (e.g., pulmonary embolus, asthma, emphysema)     Nodules - reports starting radiation next week 8. CAUSE: "What do you think is causing the breathing problem?"       unknown 9. OTHER SYMPTOMS: "Do you have any other symptoms? (e.g., dizziness, runny nose, cough, chest pain, fever)     denies  Protocols used: Breathing Difficulty-A-AH

## 2023-12-14 NOTE — Procedures (Signed)
 Interventional Radiology Procedure Note  Procedure: Placement of a right IJ approach single lumen PowerPort.  Tip is positioned at the superior cavoatrial junction and catheter is ready for immediate use.  Complications: None Recommendations:  - Ok to shower tomorrow - Do not submerge for 7 days - Routine line care   Signed,  Yvone Neu. Loreta Ave, DO

## 2023-12-14 NOTE — Telephone Encounter (Signed)
 I called and spoke with the patient to let him know that Dr. Jeryl Moris has reviewed his simulation images and sees the disease in the lung about the airways has increased. He recommends treating the lung and sternum at the same time as the left rib. He has had simulation already and will be starting treatment Monday.

## 2023-12-15 ENCOUNTER — Ambulatory Visit
Admission: RE | Admit: 2023-12-15 | Discharge: 2023-12-15 | Disposition: A | Discharge status: Home / Self Care | Source: Ambulatory Visit | Attending: Radiation Oncology | Admitting: Radiation Oncology

## 2023-12-15 ENCOUNTER — Ambulatory Visit (INDEPENDENT_AMBULATORY_CARE_PROVIDER_SITE_OTHER)

## 2023-12-15 ENCOUNTER — Ambulatory Visit: Admitting: Emergency Medicine

## 2023-12-15 ENCOUNTER — Encounter: Payer: Self-pay | Admitting: Emergency Medicine

## 2023-12-15 VITALS — BP 114/90 | HR 101 | Temp 98.5°F | Ht 73.0 in | Wt 264.0 lb

## 2023-12-15 DIAGNOSIS — Z510 Encounter for antineoplastic radiation therapy: Secondary | ICD-10-CM | POA: Diagnosis not present

## 2023-12-15 DIAGNOSIS — R0609 Other forms of dyspnea: Secondary | ICD-10-CM

## 2023-12-15 DIAGNOSIS — C7951 Secondary malignant neoplasm of bone: Secondary | ICD-10-CM | POA: Diagnosis present

## 2023-12-15 DIAGNOSIS — C3411 Malignant neoplasm of upper lobe, right bronchus or lung: Secondary | ICD-10-CM | POA: Insufficient documentation

## 2023-12-15 DIAGNOSIS — Z87891 Personal history of nicotine dependence: Secondary | ICD-10-CM | POA: Diagnosis not present

## 2023-12-15 DIAGNOSIS — J441 Chronic obstructive pulmonary disease with (acute) exacerbation: Secondary | ICD-10-CM | POA: Diagnosis not present

## 2023-12-15 HISTORY — DX: Chronic obstructive pulmonary disease with (acute) exacerbation: J44.1

## 2023-12-15 MED ORDER — PREDNISONE 10 MG PO TABS: ORAL_TABLET | ORAL | 0 refills | Status: DC

## 2023-12-15 MED ORDER — AZITHROMYCIN 250 MG PO TABS: ORAL_TABLET | ORAL | 0 refills | Status: DC

## 2023-12-15 NOTE — Assessment & Plan Note (Signed)
 Follow with radiation oncology and oncology as planned

## 2023-12-15 NOTE — Patient Instructions (Addendum)
 We will check a chest x-ray today Please take prednisone as directed until completely gone Please take azithromycin as directed until completely gone Use your Trelegy 1 inhalation once daily until you run out of it.  At that time switch over to Breztri  2 puffs twice a day.  Remember to rinse and gargle after using these Keep albuterol  available to use 2 puffs or 1 nebulizer treatment up to every 4 hours if needed for shortness of breath, chest tightness, wheezing.  Follow with radiation oncology and oncology as planned Follow with APP in 3 months so that we can assess your breathing on the new medication.

## 2023-12-15 NOTE — Assessment & Plan Note (Addendum)
 Complains of symptoms consistent with an acute exacerbation.  He is on LABA/LAMA/ICS.  No clear precipitant.  He has not started cancer therapy yet.  Chest x-ray reassuring, no evidence of new infiltrate, effusion, etc.  Please take prednisone as directed until completely gone Please take azithromycin as directed until completely gone Use your Trelegy 1 inhalation once daily until you run out of it.  At that time switch over to Breztri  2 puffs twice a day.  Remember to rinse and gargle after using these Keep albuterol  available to use 2 puffs or 1 nebulizer treatment up to every 4 hours if needed for shortness of breath, chest tightness, wheezing.  Follow with APP in 3 months so that we can assess your breathing on the new medication.

## 2023-12-15 NOTE — Progress Notes (Signed)
 Subjective:    Patient ID: Jose Joseph, male    DOB: 06/20/1956, 68 y.o.   MRN: [id number]  HPI 68 year old former smoker (35 pack years), COPD, right upper lobe pulmonary nodule and mediastinal adenopathy for which he underwent bronchoscopy with navigation and EBUS on 11/21/2023 that showed adenocarcinoma of the lung with metastasis to the mediastinum.  There is also evidence for possible bone metastasis on his PET scan.  He was seen 11/29/2023 and was changed from Trelegy to Breztri  due to cost and insurance coverage, remains on the Trelegy because it hasn't run out yet. He is planning to start XRT next week.  He has had increased SOB for about 2 weeks, has felt worse for about 5 days, has been dealing with increased dyspnea, cough and more wheeze.  A bit difficult to tell whether this is acute or subacute.  Has albuterol , uses about 2x a day.  No clear precipitating event, no fever, chills, URI symptoms.     Review of Systems As per HPI  Past Medical History:  Diagnosis Date   Arthritis    Asthma    Complication of anesthesia    after his neck surgery pt. trying to get up and walk out   Depression    Dyspnea    when he was smoking   History of kidney stones    2021   Pneumonia    during COVID in 2021     Family History  Problem Relation Age of Onset   Kidney cancer Sister    Prostate cancer Brother      Social History   Socioeconomic History   Marital status: Married    Spouse name: Not on file   Number of children: 2   Years of education: Not on file   Highest education level: Not on file  Occupational History   Occupation: retired  Tobacco Use   Smoking status: Former    Current packs/day: 0.00    Average packs/day: 1 pack/day for 35.0 years (35.0 ttl pk-yrs)    Types: Cigarettes    Start date: 71    Quit date: 2014    Years since quitting: 11.3    Passive exposure: Past   Smokeless tobacco: Former  Substance and Sexual Activity   Alcohol use: Not  Currently    Alcohol/week: 2.0 standard drinks of alcohol    Types: 2 Cans of beer per week   Drug use: Yes    Types: Marijuana    Comment: last: 1 week ago   Sexual activity: Not on file  Other Topics Concern   Not on file  Social History Narrative   Not on file   Social Drivers of Health   Financial Resource Strain: Not on file  Food Insecurity: No Food Insecurity (12/12/2023)   Hunger Vital Sign    Worried About Running Out of Food in the Last Year: Never true    Ran Out of Food in the Last Year: Never true  Transportation Needs: No Transportation Needs (12/12/2023)   PRAPARE - Administrator, Civil Service (Medical): No    Lack of Transportation (Non-Medical): No  Physical Activity: Not on file  Stress: Not on file  Social Connections: Not on file  Intimate Partner Violence: Not At Risk (12/12/2023)   Humiliation, Afraid, Rape, and Kick questionnaire    Fear of Current or Ex-Partner: No    Emotionally Abused: No    Physically Abused: No    Sexually Abused: No  Allergies  Allergen Reactions   Bee Venom Anaphylaxis    syncope     Outpatient Medications Prior to Visit  Medication Sig Dispense Refill   albuterol  (VENTOLIN  HFA) 108 (90 Base) MCG/ACT inhaler Inhale 2 puffs into the lungs every 6 (six) hours as needed for wheezing or shortness of breath. 8 g 2   budeson-glycopyrrolate-formoterol (BREZTRI  AEROSPHERE) 160-9-4.8 MCG/ACT AERO inhaler Inhale 2 puffs into the lungs in the morning and at bedtime.     cetirizine (ZYRTEC) 10 MG tablet Take 10 mg by mouth in the morning.     citalopram  (CELEXA ) 20 MG tablet Take 20 mg by mouth in the morning.     fexofenadine (ALLEGRA) 180 MG tablet Take 180 mg by mouth in the morning.     Fluticasone-Umeclidin-Vilant (TRELEGY ELLIPTA ) 100-62.5-25 MCG/ACT AEPB Inhale 1 puff into the lungs daily. 1 each 6   Fluticasone-Umeclidin-Vilant (TRELEGY ELLIPTA ) 100-62.5-25 MCG/ACT AEPB Inhale 1 puff into the lungs daily at 2 PM.      No facility-administered medications prior to visit.        Objective:   Physical Exam  Vitals:   12/15/23 1142  BP: (!) 114/90  Pulse: (!) 101  Temp: 98.5 F (36.9 C)  TempSrc: Oral  SpO2: 95%  Weight: 264 lb (119.7 kg)  Height: 6\' 1"  (1.854 m)   Gen: Pleasant, obese elderly man, in no distress,  normal affect  ENT: No lesions,  mouth clear,  oropharynx clear, no postnasal drip  Neck: No JVD, no stridor  Lungs: No use of accessory muscles, scattered expiratory wheezes  Cardiovascular: RRR, heart sounds normal, no murmur or gallops, no peripheral edema  Musculoskeletal: No deformities, no cyanosis or clubbing  Neuro: alert, awake, non focal  Skin: Warm, no lesions or rash      Assessment & Plan:  COPD with acute exacerbation (HCC) Complains of symptoms consistent with an acute exacerbation.  He is on LABA/LAMA/ICS.  No clear precipitant.  He has not started cancer therapy yet.  Chest x-ray reassuring, no evidence of new infiltrate, effusion, etc.  Please take prednisone as directed until completely gone Please take azithromycin as directed until completely gone Use your Trelegy 1 inhalation once daily until you run out of it.  At that time switch over to Breztri  2 puffs twice a day.  Remember to rinse and gargle after using these Keep albuterol  available to use 2 puffs or 1 nebulizer treatment up to every 4 hours if needed for shortness of breath, chest tightness, wheezing.  Follow with APP in 3 months so that we can assess your breathing on the new medication.  Malignant neoplasm of upper lobe of right lung [facility]) Follow with radiation oncology and oncology as planned  Time spent 31 minutes  Racheal Buddle, MD, PhD 12/15/2023, 12:26 PM [facility] [phone] or if no answer before 7:00PM call [phone] For any issues after 7:00PM please call eLink [phone]

## 2023-12-18 ENCOUNTER — Other Ambulatory Visit: Payer: Self-pay

## 2023-12-18 DIAGNOSIS — Z510 Encounter for antineoplastic radiation therapy: Secondary | ICD-10-CM | POA: Diagnosis not present

## 2023-12-18 LAB — RAD ONC ARIA SESSION SUMMARY

## 2023-12-19 ENCOUNTER — Other Ambulatory Visit: Payer: Self-pay

## 2023-12-19 ENCOUNTER — Ambulatory Visit
Admission: RE | Admit: 2023-12-19 | Discharge: 2023-12-19 | Disposition: A | Discharge status: Home / Self Care | Source: Ambulatory Visit | Attending: Radiation Oncology

## 2023-12-19 DIAGNOSIS — Z510 Encounter for antineoplastic radiation therapy: Secondary | ICD-10-CM | POA: Diagnosis not present

## 2023-12-19 LAB — RAD ONC ARIA SESSION SUMMARY

## 2023-12-20 ENCOUNTER — Telehealth (HOSPITAL_BASED_OUTPATIENT_CLINIC_OR_DEPARTMENT_OTHER): Payer: Self-pay

## 2023-12-20 ENCOUNTER — Other Ambulatory Visit (HOSPITAL_BASED_OUTPATIENT_CLINIC_OR_DEPARTMENT_OTHER): Payer: Self-pay

## 2023-12-20 ENCOUNTER — Other Ambulatory Visit: Payer: Self-pay

## 2023-12-20 ENCOUNTER — Ambulatory Visit
Admission: RE | Admit: 2023-12-20 | Discharge: 2023-12-20 | Disposition: A | Discharge status: Home / Self Care | Source: Ambulatory Visit | Attending: Radiation Oncology | Admitting: Radiation Oncology

## 2023-12-20 ENCOUNTER — Encounter (HOSPITAL_COMMUNITY): Payer: Self-pay

## 2023-12-20 DIAGNOSIS — Z510 Encounter for antineoplastic radiation therapy: Secondary | ICD-10-CM | POA: Diagnosis not present

## 2023-12-20 LAB — RAD ONC ARIA SESSION SUMMARY

## 2023-12-20 MED ORDER — BREZTRI AEROSPHERE 160-9-4.8 MCG/ACT IN AERO: 2.0000 | INHALATION_SPRAY | Freq: Two times a day (BID) | RESPIRATORY_TRACT | 3 refills | Status: DC

## 2023-12-20 NOTE — Telephone Encounter (Signed)
 Rx sent to pharmacy and pt notified   Copied from CRM [id number]. Topic: Clinical - Medication Question >> Dec 20, 2023 10:05 AM Isabell A wrote: Reason for CRM: Patient states Jose Joseph gave him a sample of Breztri , patient is requesting this medication to be prescribed.

## 2023-12-20 NOTE — Progress Notes (Signed)
 [facility] [address], [place] [address]   Clinic Day:  12/21/2023  Referring physician: Artemisa Bile, MD  Patient Care Team: Artemisa Bile, MD as PCP - General (Internal Medicine) Paulett Boros, MD as Medical Oncologist (Medical Oncology) Gerhard Knuckles, RN as Oncology Nurse Navigator (Medical Oncology)   ASSESSMENT & PLAN:   Assessment:  1.  Stage IV adenocarcinoma of the lung to the bones: - PET/CT (10/19/2023): Bilateral lung nodules with largest right upper lobe nodule measuring 3.2 x 3 cm, bilateral hilar and mediastinal lymphadenopathy, right supraclavicular lymphadenopathy, hypermetabolic bone lesions involving sternum, left sixth rib and right iliac bone. - Bronchoscopy and biopsy on 11/21/2023 - Pathology (11/21/2023): RUL nodule FNA: Adenocarcinoma, positive for TTF-1 and negative for p40.  FNA of lymph node stations 4R,11R consistent with adenocarcinoma. - Guardant360 (12/06/2020): BRAF V600E, T p53, and NF2 - Caris PD-L1 (22 C3) TPS 95% - XRT to the rib and lung from 12/18/2023 through 12/29/2023 - Braftovi (Encorafenib) 450 mg daily and Mektovi (binimetinib) 45 mg twice daily started on 01/01/2024  2. Social/Family History: -Lives at home with wife. Resides in [place] 4-5 months each year. 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 Quit tobacco use 10 years ago of 1-2 ppd for over 40 years. Possible asbestos exposure when working at [employer]. 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  -Sister died from renal cancer. Brother died from prostate cancer. Another brother died from pancreatic cancer. Maternal uncle died from lung cancer. Paternal aunt died from cancer, type unknown.  Plan:  1.  Stage IV adenocarcinoma of the lung to the bones, BRAF V600E positive: - We have reviewed brain MRI from 12/06/2023: Small foci of diffusion weighted abnormality in the anterior right frontal lobe, right parieto-occipital lobe and left parietal and right cerebellar  hemisphere.  May reflect acute/subacute infarcts.  He is asymptomatic.  Recommend repeating MRI in 6 to 8 weeks. - We discussed findings on Guardant360.  We also discussed PD-L1 results.  Full results of NGS tissue testing is pending. - I have recommended Encorafenib 450 mg daily and binimetinib 45 mg daily to start on 01/01/2024.  We discussed side effects including but not limited to gastrointestinal side effects like diarrhea, constipation, abdominal pain, increased amylase and lipase, nausea and vomiting.  We also discussed cytopenias, elevated liver enzymes, increased creatinine, visual symptoms, and rare chance of cardiomyopathy. - We will obtain baseline echocardiogram. - He is undergoing radiation therapy which she will complete next Friday.  He will start Braftovi and Mektovi after that.  2.  Cough with clear expectoration: - He had chest x-ray done on 12/15/2023 which showed consolidative opacity in the left lower lobe.  He was given Medrol  Dosepak and Z-Pak by Dr. Baldwin Levee.  He reports that it has gotten slightly better but not completely.  He still having difficulty breathing.  He has inhalers.  I will give him Augmentin twice daily for 7 days.  He still has 6 more days of prednisone.  Also recommend Mucinex daily.   Orders Placed This Encounter  Procedures   ECHOCARDIOGRAM COMPLETE    Standing Status:   Future    Expiration Date:   12/20/2024    Where should this test be performed:   [facility]    Perflutren DEFINITY (image enhancing agent) should be administered unless hypersensitivity or allergy exist:   Administer Perflutren    Reason for exam-Echo:   Chemo  Z09     I,Helena R Teague,acting as a  scribe for Paulett Boros, MD.,have documented all relevant documentation on the behalf of Paulett Boros, MD,as directed by  Paulett Boros, MD while in the presence of Paulett Boros, MD.  I, Paulett Boros MD, have reviewed the above documentation for accuracy and  completeness, and I agree with the above.    Paulett Boros, MD   5/8/20252:20 PM  CHIEF COMPLAINT/PURPOSE OF CONSULT:   Diagnosis: Metastatic adenocarcinoma of the lung to the bones   Cancer Staging  Malignant neoplasm of upper lobe of right lung [facility]) Staging form: Lung, AJCC V9 - Clinical stage from 11/30/2023: cT4, cN3(f), cM1 - Unsigned    Prior Therapy: None  Current Therapy: Braftovi and Mektovi   HISTORY OF PRESENT ILLNESS:   Oncology History   No history exists.      Jose Joseph is a 68 y.o. male presenting to clinic today for evaluation of RUL lung adenocarcinoma at the request of Dara Ear, NP.  Patient was originally seen by pulmonology on 10/16/23 after imaging from December 2024 done in [place] showed a spiculated lung lesion of 4.3 x 3.5 cm in size with satellite lesions and smaller bilateral lung nodules. He then had an initial PET on 10/19/23 which showed: Findings are most consistent with metastatic lung cancer. There are multiple hypermetabolic pulmonary nodules bilaterally, largest a spiculated nodule inferiorly in the right upper lobe which likely represents the primary malignancy. Hypermetabolic mediastinal and hilar lymph nodes bilaterally, consistent with metastatic disease. Hypermetabolic osseous lesions involving the sternum, left 6th rib and right iliac bone, consistent with metastatic disease. No evidence of metastatic disease in the abdomen or pelvis.   Jose Joseph then had bronchoscopy with biopsies of the lung nodules under Dr. Baldwin Levee on 11/21/23. Cytology of the RUL lung nodules revealed: adenocarcinoma with tumor cells positive for TTF-1 and negative for p40. Lymph nodes 4R and 11R biopsies showed: adenocarcinoma and lymphoid tissue identified. Lymph node 10R found: no malignant cells, but lymphocytes identified.   Today, he states that he is doing well overall. His appetite level is at 100%. His energy level is at 50%. He is accompanied by his wife and    Jose Joseph notes he was first aware of lung cancer in January 2025 after a lung infection that required antibiotics and imaging to be done.  He has unintentionally lost 4 pounds in the last 1.5 months. He notes a normal appetite. Jose Joseph denies any prior history of MI's, CVA's, or TIA's.   He reports chest pain attributable to coughing and is treating pain with OTC ibuprofen. His coughing has worsened recently with an onset before bronchoscopy. He also notes occasional wheezing, worsened when lying down. Wheezing and coughing improve when lying on his left side. Jose Joseph also reports occasional band-like lower abdominal pain present on exertion and when lifting weights. He denies any headaches, vision changes, or ankle swellings.   Surgical history includes hip replacements, hernia repair, left knee arthroplasty, neck surgery that improved neuropathy in right shoulder, and right wrist surgery. He denies any peripheral neuropathy. Jose Joseph stays in [place] for 4-5 months of the year, as his wife has a home there.  INTERVAL HISTORY:   Jose Joseph is a 68 y.o. male presenting to the clinic today for follow-up of metastatic adenocarcinoma of right lung. He was last seen by me on 11/30/23 in consultation.  Since his last visit, he had Brain MRI on 12/06/23 and had his port inserted on 12/14/23 by IR. Jose Joseph has started radiation therapy with Dr. Jeryl Moris.   Today,  he states that he is doing well overall. His appetite level is at 50%. His energy level is at 50%.   PAST MEDICAL HISTORY:   Past Medical History: Past Medical History:  Diagnosis Date   Arthritis    Asthma    Complication of anesthesia    after his neck surgery pt. trying to get up and walk out   Depression    Dyspnea    when he was smoking   History of kidney stones    2021   Pneumonia    during COVID in 2021    Surgical History: Past Surgical History:  Procedure Laterality Date   BRONCHIAL BIOPSY  11/21/2023   Procedure: BRONCHOSCOPY, WITH  BIOPSY;  Surgeon: Denson Flake, MD;  Location: [facility] ENDOSCOPY;  Service: Pulmonary;;   BRONCHIAL NEEDLE ASPIRATION BIOPSY  11/21/2023   Procedure: BRONCHOSCOPY, WITH NEEDLE ASPIRATION BIOPSY;  Surgeon: Denson Flake, MD;  Location[facility] ENDOSCOPY;  Service: Pulmonary;;   COLONOSCOPY WITH PROPOFOL  N/A 01/19/2023   Procedure: COLONOSCOPY WITH PROPOFOL ;  Surgeon: Suzette Espy, MD;  Location: [facility] ENDO SUITE;  Service: Endoscopy;  Laterality: N/A;  200pm, asa 2   ENDOBRONCHIAL ULTRASOUND Bilateral 11/21/2023   Procedure: ENDOBRONCHIAL ULTRASOUND (EBUS);  Surgeon: Denson Flake, MD;  Location: [facility] ENDOSCOPY;  Service: Pulmonary;  Laterality: Bilateral;   IR IMAGING GUIDED PORT INSERTION  12/14/2023   KNEE ARTHROCENTESIS     NECK SURGERY     POLYPECTOMY  01/19/2023   Procedure: POLYPECTOMY;  Surgeon: Suzette Espy, MD;  Location: [facility] ENDO SUITE;  Service: Endoscopy;;   TOTAL HIP ARTHROPLASTY Left 01/15/2021   Procedure: LEFT TOTAL HIP ARTHROPLASTY ANTERIOR APPROACH;  Surgeon: Adah Acron, MD;  Location: [facility] OR;  Service: Orthopedics;  Laterality: Left;   VIDEO BRONCHOSCOPY WITH ENDOBRONCHIAL NAVIGATION Bilateral 11/21/2023   Procedure: VIDEO BRONCHOSCOPY WITH ENDOBRONCHIAL NAVIGATION;  Surgeon: Denson Flake, MD;  Location: [facility] ENDOSCOPY;  Service: Pulmonary;  Laterality: Bilateral;   WRIST ARTHROPLASTY      Social History: Social History   Socioeconomic History   Marital status: Married    Spouse name: Not on file   Number of children: 2   Years of education: Not on file   Highest education level: Not on file  Occupational History   Occupation: retired  Tobacco Use   Smoking status: Former    Current packs/day: 0.00    Average packs/day: 1 pack/day for 35.0 years (35.0 ttl pk-yrs)    Types: Cigarettes    Start date: 78    Quit date: 2014    Years since quitting: 11.3    Passive exposure: Past   Smokeless tobacco: Former  Substance and Sexual Activity   Alcohol use: Not Currently     Alcohol/week: 2.0 standard drinks of alcohol    Types: 2 Cans of beer per week   Drug use: Yes    Types: Marijuana    Comment: last: 1 week ago   Sexual activity: Not on file  Other Topics Concern   Not on file  Social History Narrative   Not on file   Social Drivers of Health   Financial Resource Strain: Not on file  Food Insecurity: No Food Insecurity (12/12/2023)   Hunger Vital Sign    Worried About Running Out of Food in the Last Year: Never true    Ran Out of Food in the Last Year: Never true  Transportation Needs: No Transportation Needs (12/12/2023)   PRAPARE - Transportation  Lack of Transportation (Medical): No    Lack of Transportation (Non-Medical): No  Physical Activity: Not on file  Stress: Not on file  Social Connections: Not on file  Intimate Partner Violence: Not At Risk (12/12/2023)   Humiliation, Afraid, Rape, and Kick questionnaire    Fear of Current or Ex-Partner: No    Emotionally Abused: No    Physically Abused: No    Sexually Abused: No    Family History: Family History  Problem Relation Age of Onset   Kidney cancer Sister    Prostate cancer Brother     Current Medications:  Current Outpatient Medications:    albuterol  (VENTOLIN  HFA) 108 (90 Base) MCG/ACT inhaler, Inhale 2 puffs into the lungs every 6 (six) hours as needed for wheezing or shortness of breath., Disp: 8 g, Rfl: 2   amoxicillin-clavulanate (AUGMENTIN) 875-125 MG tablet, Take 1 tablet by mouth 2 (two) times daily., Disp: 14 tablet, Rfl: 0   binimetinib (MEKTOVI) 15 MG tablet, Take 3 tablets (45 mg total) by mouth 2 (two) times daily., Disp: 180 tablet, Rfl: 2   budeson-glycopyrrolate-formoterol (BREZTRI  AEROSPHERE) 160-9-4.8 MCG/ACT AERO inhaler, Inhale 2 puffs into the lungs in the morning and at bedtime., Disp: 10.2 g, Rfl: 3   cetirizine (ZYRTEC) 10 MG tablet, Take 10 mg by mouth in the morning., Disp: , Rfl:    citalopram  (CELEXA ) 20 MG tablet, Take 20 mg by mouth in the morning.,  Disp: , Rfl:    encorafenib (BRAFTOVI) 75 MG capsule, Take 6 capsules (450 mg total) by mouth daily., Disp: 180 capsule, Rfl: 2   fexofenadine (ALLEGRA) 180 MG tablet, Take 180 mg by mouth in the morning., Disp: , Rfl:    Fluticasone-Umeclidin-Vilant (TRELEGY ELLIPTA ) 100-62.5-25 MCG/ACT AEPB, Inhale 1 puff into the lungs daily., Disp: 1 each, Rfl: 6   Fluticasone-Umeclidin-Vilant (TRELEGY ELLIPTA ) 100-62.5-25 MCG/ACT AEPB, Inhale 1 puff into the lungs daily at 2 PM., Disp: , Rfl:    predniSONE (DELTASONE) 10 MG tablet, Take 40mg  daily for 3 days, then 30mg  daily for 3 days, then 20mg  daily for 3 days, then 10mg  daily for 3 days, then stop, Disp: 30 tablet, Rfl: 0   prochlorperazine (COMPAZINE) 10 MG tablet, Take 1 tablet (10 mg total) by mouth every 6 (six) hours as needed., Disp: 60 tablet, Rfl: 2   Allergies: Allergies  Allergen Reactions   Bee Venom Anaphylaxis    syncope    REVIEW OF SYSTEMS:   Review of Systems  Constitutional:  Positive for fatigue. Negative for chills and fever.  HENT:   Negative for lump/mass, mouth sores, nosebleeds, sore throat and trouble swallowing.   Eyes:  Negative for eye problems.  Respiratory:  Positive for cough and shortness of breath.   Cardiovascular:  Negative for chest pain, leg swelling and palpitations.  Gastrointestinal:  Negative for abdominal pain, constipation, diarrhea, nausea and vomiting.  Genitourinary:  Negative for bladder incontinence, difficulty urinating, dysuria, frequency, hematuria and nocturia.   Musculoskeletal:  Negative for arthralgias, back pain, flank pain, myalgias and neck pain.  Skin:  Negative for itching and rash.  Neurological:  Positive for dizziness and headaches. Negative for numbness.  Hematological:  Does not bruise/bleed easily.  Psychiatric/Behavioral:  Positive for depression and sleep disturbance. Negative for suicidal ideas. The patient is nervous/anxious.   All other systems reviewed and are negative.     VITALS:   Blood pressure 134/77, pulse 98, temperature (!) 97.5 F (36.4 C), temperature source Tympanic, resp. rate 18, height 6\' 1"  (  1.854 m), weight 266 lb (120.7 kg), SpO2 91%.  Wt Readings from Last 3 Encounters:  12/21/23 266 lb (120.7 kg)  12/15/23 264 lb (119.7 kg)  12/14/23 264 lb (119.7 kg)    Body mass index is 35.09 kg/m.  Performance status (ECOG): 1 - Symptomatic but completely ambulatory  PHYSICAL EXAM:   Physical Exam Vitals and nursing note reviewed. Exam conducted with a chaperone present.  Constitutional:      Appearance: Normal appearance.  Cardiovascular:     Rate and Rhythm: Normal rate and regular rhythm.     Pulses: Normal pulses.     Heart sounds: Normal heart sounds.  Pulmonary:     Effort: Pulmonary effort is normal.     Breath sounds: Normal breath sounds.  Abdominal:     Palpations: Abdomen is soft. There is no hepatomegaly, splenomegaly or mass.     Tenderness: There is no abdominal tenderness.  Musculoskeletal:     Right lower leg: No edema.     Left lower leg: No edema.  Lymphadenopathy:     Cervical: No cervical adenopathy.     Right cervical: No superficial, deep or posterior cervical adenopathy.    Left cervical: No superficial, deep or posterior cervical adenopathy.     Upper Body:     Right upper body: No supraclavicular or axillary adenopathy.     Left upper body: No supraclavicular or axillary adenopathy.  Neurological:     General: No focal deficit present.     Mental Status: He is alert and oriented to person, place, and time.  Psychiatric:        Mood and Affect: Mood normal.        Behavior: Behavior normal.     LABS:   CBC    Component Value Date/Time   WBC 9.7 11/30/2023 0944   RBC 4.92 11/30/2023 0944   HGB 15.2 11/30/2023 0944   HCT 45.0 11/30/2023 0944   PLT 239 11/30/2023 0944   MCV 91.5 11/30/2023 0944   MCH 30.9 11/30/2023 0944   MCHC 33.8 11/30/2023 0944   RDW 12.5 11/30/2023 0944   LYMPHSABS 1.7  11/30/2023 0944   MONOABS 0.9 11/30/2023 0944   EOSABS 0.4 11/30/2023 0944   BASOSABS 0.0 11/30/2023 0944    CMP    Component Value Date/Time   NA 135 11/30/2023 0944   K 3.4 (L) 11/30/2023 0944   CL 99 11/30/2023 0944   CO2 26 11/30/2023 0944   GLUCOSE 100 (H) 11/30/2023 0944   BUN 15 11/30/2023 0944   CREATININE 0.84 11/30/2023 0944   CALCIUM 9.5 11/30/2023 0944   PROT 7.5 11/30/2023 0944   ALBUMIN 3.6 11/30/2023 0944   AST 18 11/30/2023 0944   ALT 17 11/30/2023 0944   ALKPHOS 78 11/30/2023 0944   BILITOT 0.5 11/30/2023 0944   GFRNONAA >60 11/30/2023 0944   GFRAA  01/08/2009 1449    >60        The eGFR has been calculated using the MDRD equation. This calculation has not been validated in all clinical situations. eGFR's persistently <60 mL/min signify possible Chronic Kidney Disease.     No results found for: "CEA1", "CEA" / No results found for: "CEA1", "CEA" No results found for: "PSA1" No results found for: "ZOX096" No results found for: "CAN125"  No results found for: "TOTALPROTELP", "ALBUMINELP", "A1GS", "A2GS", "BETS", "BETA2SER", "GAMS", "MSPIKE", "SPEI" No results found for: "TIBC", "FERRITIN", "IRONPCTSAT" No results found for: "LDH"   STUDIES:   DG Chest 2  View Result Date: 12/15/2023 CLINICAL DATA:  Shortness of breath EXAM: CHEST - 2 VIEW COMPARISON:  Chest radiograph November 21, 2023 FINDINGS: Port-A-Cath tip projects over the superior vena cava. Stable cardiac and mediastinal contours. Redemonstrated bandlike consolidation right mid lung. Interval development of consolidative opacities left lung base. Possible small left pleural effusion. Thoracic spine degenerative changes. No pneumothorax. IMPRESSION: New consolidative opacity left lower lung may represent combination of pleural fluid and underlying atelectasis. Underlying infection not excluded. Bandlike consolidation right mid lung may represent atelectasis and/or associated malignant process.  Electronically Signed   By: Jone Neither M.D.   On: 12/15/2023 12:30   IR IMAGING GUIDED PORT INSERTION Result Date: 12/14/2023 INDICATION: 68 year old male referred for port catheter EXAM: IMAGE GUIDED PORT CATHETER MEDICATIONS: None ANESTHESIA/SEDATION: Moderate (conscious) sedation was employed during this procedure. A total of Versed  1.0 mg and Fentanyl  50 mcg was administered intravenously. Moderate Sedation Time: 16 minutes. The patient's level of consciousness and vital signs were monitored continuously by radiology nursing throughout the procedure under my direct supervision. FLUOROSCOPY TIME:  Fluoroscopy Time:   (31 mGy). COMPLICATIONS: None PROCEDURE: Informed written consent was obtained from the patient after a discussion of the risks, benefits, and alternatives to treatment. Questions regarding the procedure were encouraged and answered. The right neck and chest were prepped with chlorhexidine  in a sterile fashion, and a sterile drape was applied covering the operative field. Maximum barrier sterile technique with sterile gowns and gloves were used for the procedure. A timeout was performed prior to the initiation of the procedure. Ultrasound survey was performed with images stored and sent to PACs. Right IJ vein documented to be patent. The right neck and chest was prepped with chlorhexidine , and draped in the usual sterile fashion using maximum barrier technique (cap and mask, sterile gown, sterile gloves, large sterile sheet, hand hygiene and cutaneous antiseptic). Local anesthesia was attained by infiltration with 1% lidocaine  without epinephrine . Ultrasound demonstrated patency of the right internal jugular vein, and this was documented with an image. Under real-time ultrasound guidance, this vein was accessed with a 21 gauge micropuncture needle and image documentation was performed. A small dermatotomy was made at the access site with an 11 scalpel. A 0.018" wire was advanced into the SVC and  used to estimate the length of the internal catheter. The access needle exchanged for a 53F micropuncture vascular sheath. The 0.018" wire was then removed and a 0.035" wire advanced into the IVC. An appropriate location for the subcutaneous reservoir was selected below the clavicle and an incision was made through the skin and underlying soft tissues. The subcutaneous tissues were then dissected using a combination of blunt and sharp surgical technique and a pocket was formed. A single lumen power injectable portacatheter was then tunneled through the subcutaneous tissues from the pocket to the dermatotomy and the port reservoir placed within the subcutaneous pocket. The venous access site was then serially dilated and a peel away vascular sheath placed over the wire. The wire was removed and the port catheter advanced into position under fluoroscopic guidance. The catheter tip is positioned in the cavoatrial junction. This was documented with a spot image. The portacatheter was then tested and found to flush and aspirate well. The port was flushed with saline followed by 100 units/mL heparinized saline. The pocket was then closed in two layers using first subdermal inverted interrupted absorbable sutures followed by a running subcuticular suture. The epidermis was then sealed with Dermabond. The dermatotomy at the venous access  site was also seal with Dermabond. Patient tolerated the procedure well and remained hemodynamically stable throughout. No complications encountered and no significant blood loss encountered IMPRESSION: Status post right IJ port catheter. Signed, Marciano Settles. Rexine Cater, RPVI [facility] Radiology Electronically Signed   By: Myrlene Asper D.O.   On: 12/14/2023 12:48   MR Brain W Wo Contrast Result Date: 12/06/2023 CLINICAL DATA:  Provided history: Malignant neoplasm of unspecified part of unspecified bronchus or lung. Non-small cell lung  cancer, staging. EXAM: MRI HEAD WITHOUT AND WITH CONTRAST TECHNIQUE: Multiplanar, multiecho pulse sequences of the brain and surrounding structures were obtained without and with intravenous contrast. CONTRAST:  10mL GADAVIST  GADOBUTROL  1 MMOL/ML IV SOLN COMPARISON:  PET CT 10/19/2023. FINDINGS: Brain: Mild generalized cerebral atrophy. Punctate focus of restricted diffusion within the anterior right frontal lobe without appreciable corresponding enhancement (series 6, image 28). Multiple small foci of diffusion-weighted signal abnormality within the medial right frontoparietal cortex measuring up to 5 mm (for instance as seen on series 6, image 28). No corresponding enhancement appreciated. Punctate focus of diffusion-weighted signal abnormality within the left parietal lobe white matter without appreciable corresponding enhancement (series 6, image 33). Punctate focus of diffusion-weighted signal abnormality within the right cerebellar hemisphere without appreciable corresponding enhancement (series 6, image 13). Background multifocal T2 FLAIR hyperintense signal abnormality within the cerebral white matter (moderate) and pons (mild). Findings are nonspecific, but most often secondary to chronic small vessel ischemia. No chronic intracranial blood products. No extra-axial fluid collection. No midline shift. Vascular: Maintained flow voids within the proximal large arterial vessels. Skull and upper cervical spine: No focal worrisome marrow lesion. Sinuses/Orbits: No mass or acute finding within the imaged orbits. 10 mm mucous retention cyst within the left maxillary sinus. Impression #1 will be called to the ordering clinician or representative by the Radiologist Assistant, and communication documented in the PACS or [employer]. IMPRESSION: 1. Small foci of diffusion-weighted signal abnormality within the anterior right frontal lobe, right parietooccipital lobes, left parietal lobe and right cerebellar  hemisphere (measuring up to 5 mm). There is no appreciable corresponding pathologic enhancement at these sites, and these likely reflect acute/subacute infarcts (involvement of multiple vascular territories concerning for an embolic process). However, a short-interval follow-up brain MRI is recommended in 6 weeks to ensure expected evolution and to exclude poorly enhancing metastases. 2. Background T2 FLAIR hyperintense signal changes within the cerebral white matter (moderate) and pons (mild), nonspecific but most often secondary to chronic small vessel ischemia. 3. 10 mm left maxillary sinus mucous retention cyst. Electronically Signed   By: Bascom Lily D.O.   On: 12/06/2023 13:04

## 2023-12-21 ENCOUNTER — Ambulatory Visit
Admission: RE | Admit: 2023-12-21 | Discharge: 2023-12-21 | Disposition: A | Discharge status: Home / Self Care | Source: Ambulatory Visit | Attending: Radiation Oncology | Admitting: Radiation Oncology

## 2023-12-21 ENCOUNTER — Telehealth: Payer: Self-pay | Admitting: Pharmacy Technician

## 2023-12-21 ENCOUNTER — Telehealth: Payer: Self-pay | Admitting: Pharmacist

## 2023-12-21 ENCOUNTER — Other Ambulatory Visit: Payer: Self-pay

## 2023-12-21 ENCOUNTER — Inpatient Hospital Stay: Attending: Hematology | Admitting: Hematology

## 2023-12-21 ENCOUNTER — Other Ambulatory Visit (HOSPITAL_COMMUNITY): Payer: Self-pay

## 2023-12-21 VITALS — BP 134/77 | HR 98 | Temp 97.5°F | Resp 18 | Ht 73.0 in | Wt 266.0 lb

## 2023-12-21 DIAGNOSIS — Z510 Encounter for antineoplastic radiation therapy: Secondary | ICD-10-CM | POA: Diagnosis not present

## 2023-12-21 DIAGNOSIS — C3491 Malignant neoplasm of unspecified part of right bronchus or lung: Secondary | ICD-10-CM

## 2023-12-21 DIAGNOSIS — Z79899 Other long term (current) drug therapy: Secondary | ICD-10-CM | POA: Diagnosis not present

## 2023-12-21 LAB — RAD ONC ARIA SESSION SUMMARY

## 2023-12-21 MED ORDER — MEKTOVI 15 MG PO TABS
45.0000 mg | ORAL_TABLET | Freq: Two times a day (BID) | ORAL | 2 refills | Status: DC
Start: 2023-12-21 — End: 2023-12-28
  Filled 2023-12-22: qty 180, 30d supply, fill #0

## 2023-12-21 MED ORDER — BRAFTOVI 75 MG PO CAPS
450.0000 mg | ORAL_CAPSULE | Freq: Every day | ORAL | 2 refills | Status: DC
Start: 2023-12-21 — End: 2023-12-28
  Filled 2023-12-22: qty 180, 30d supply, fill #0

## 2023-12-21 MED ORDER — PROCHLORPERAZINE MALEATE 10 MG PO TABS: 10.0000 mg | ORAL_TABLET | Freq: Four times a day (QID) | ORAL | 2 refills | Status: DC | PRN

## 2023-12-21 MED ORDER — AMOXICILLIN-POT CLAVULANATE 875-125 MG PO TABS: 1.0000 | ORAL_TABLET | Freq: Two times a day (BID) | ORAL | 0 refills | Status: DC

## 2023-12-21 NOTE — Telephone Encounter (Signed)
 Oral Oncology Patient Advocate Encounter  Prior Authorization for Wendee Halon has been approved.    PA# [id number] Effective dates: 12/21/2023 through 06/18/2024  Patients co-pay is $1,832.23 .    Patty Benjaman Branch, CPhT Oncology Pharmacy Patient Advocate [facility] Direct Number: [phone] Fax: [phone]

## 2023-12-21 NOTE — Telephone Encounter (Signed)
 Oral Oncology Patient Advocate Encounter   Received notification that prior authorization for Mektovi is required.   PA submitted on 12/21/2023 Key [id number] Status is pending     Patty Benjaman Branch, CPhT Oncology Pharmacy Patient Advocate [facility] Direct Number: [phone] Fax: [phone]

## 2023-12-21 NOTE — Telephone Encounter (Signed)
 Oral Oncology Patient Advocate Encounter  Prior Authorization for Eliberto Grosser has been approved.    PA# [id number] Effective dates: 12/21/2023 through 06/18/2024  Patients co-pay is $1,832.23 .    Patty Benjaman Branch, CPhT Oncology Pharmacy Patient Advocate [facility] Direct Number: [phone] Fax: [phone]

## 2023-12-21 NOTE — Telephone Encounter (Signed)
 Oral Oncology Patient Advocate Encounter   Received notification that prior authorization for Braftovi is required.   PA submitted on 12/21/2023 Key [id number] Status is pending     Patty Benjaman Branch, CPhT Oncology Pharmacy Patient Advocate [facility] Direct Number: [phone] Fax: [phone]

## 2023-12-21 NOTE — Telephone Encounter (Addendum)
 Clinical Pharmacist Practitioner Encounter   Received new prescription for Braftovi (encorafenib) and Mektovi (binimetinib) for the treatment of Stage IV adenocarcinoma of the lung, BRAF V600E positive (Guardant360), planned duration until disease progression or unacceptable drug toxicity.  CMP from 11/30/23 assessed, no relevant lab abnormalities. Prescription dose and frequency assessed.   Patient scheduled for baseline ECHO on 12/26/23,   Current medication list in Epic reviewed, one DDIs with encorafenib identified. No DDIs with binimetinib.  Citalopram : Citalopram  may increase QTc-prolonging effects of Encorafenib. Encorafenib may decrease serum concentration of Citalopram . Monitor for QTc interval prolongation. No baseline dose adjustment needed.  Evaluated chart and no patient barriers to medication adherence identified.   Prescription has been e-scribed to the [facility] for benefits analysis and approval.  Oral Oncology Clinic will continue to follow for insurance authorization, copayment issues, initial counseling and start date.   Jose Joseph, PharmD, BCOP, CPP Hematology/Oncology Clinical Pharmacist [facility] Oral Chemotherapy Navigation Clinic [phone]  12/21/2023 2:04 PM

## 2023-12-21 NOTE — Patient Instructions (Addendum)
 [facility] Discharge Instructions   You were seen and examined today by Dr. Cheree Cords.  He reviewed the results of your MRI which did not show any evidence of cancer.   He reviewed the results of your Guardant 360 which shows you have a mutation called BRAF. This will allow us  to treat you with pills. The pills are called Braftovi and the other pill Mektovi. These pills will come from a specialty pharmacy and will be delivered to your home. Expect calls from the pharmacy regarding education about the pills and to set up delivery.   Try taking Mucinex over the counter to help cough up the mucus in your chest. We will also send Augmentin (antibiotic). Take as prescribed.   We will see you back in 3 weeks.   Return as scheduled.    Thank you for choosing [facility] to provide your oncology and hematology care.  To afford each patient quality time with our provider, please arrive at least 15 minutes before your scheduled appointment time.   If you have a lab appointment with the Cancer Center please come in thru the Main Entrance and check in at the main information desk.  You need to re-schedule your appointment should you arrive 10 or more minutes late.  We strive to give you quality time with our providers, and arriving late affects you and other patients whose appointments are after yours.  Also, if you no show three or more times for appointments you may be dismissed from the clinic at the providers discretion.     Again, thank you for choosing [facility].  Our hope is that these requests will decrease the amount of time that you wait before being seen by our physicians.       _____________________________________________________________  Should you have questions after your visit to [facility], please contact our office at [phone] and follow the prompts.  Our office hours are  8:00 a.m. and 4:30 p.m. Monday - Friday.  Please note that voicemails left after 4:00 p.m. may not be returned until the following business day.  We are closed weekends and major holidays.  You do have access to a nurse 24-7, just call the main number to the clinic [phone] and do not press any options, hold on the line and a nurse will answer the phone.    For prescription refill requests, have your pharmacy contact our office and allow 72 hours.    Due to Covid, you will need to wear a mask upon entering the hospital. If you do not have a mask, a mask will be given to you at the Main Entrance upon arrival. For doctor visits, patients may have 1 support person age 20 or older with them. For treatment visits, patients can not have anyone with them due to social distancing guidelines and our immunocompromised population.

## 2023-12-21 NOTE — Telephone Encounter (Signed)
 Oral Oncology Patient Advocate Encounter   Began application for assistance for Braftovi and Mektovi through [employer].   Application will be submitted upon completion of necessary supporting documentation. Patient must try enrolling in the Medicare Extra Help program and be denied before I can submit the application.   [employer] Oncology Together phone number [phone].   Patty Benjaman Branch, CPhT Oncology Pharmacy Patient Advocate [facility] Direct Number: [phone] Fax: [phone]

## 2023-12-22 ENCOUNTER — Telehealth: Payer: Self-pay | Admitting: Pharmacy Technician

## 2023-12-22 ENCOUNTER — Other Ambulatory Visit: Payer: Self-pay

## 2023-12-22 ENCOUNTER — Other Ambulatory Visit (HOSPITAL_COMMUNITY): Payer: Self-pay

## 2023-12-22 ENCOUNTER — Encounter (HOSPITAL_COMMUNITY): Payer: Self-pay

## 2023-12-22 ENCOUNTER — Other Ambulatory Visit: Payer: Self-pay | Admitting: Pharmacy Technician

## 2023-12-22 ENCOUNTER — Ambulatory Visit

## 2023-12-22 ENCOUNTER — Telehealth: Payer: Self-pay

## 2023-12-22 ENCOUNTER — Ambulatory Visit
Admission: RE | Admit: 2023-12-22 | Discharge: 2023-12-22 | Disposition: A | Discharge status: Home / Self Care | Source: Ambulatory Visit | Attending: Radiation Oncology | Admitting: Radiation Oncology

## 2023-12-22 ENCOUNTER — Ambulatory Visit
Admission: RE | Admit: 2023-12-22 | Discharge: 2023-12-22 | Disposition: A | Discharge status: Home / Self Care | Source: Ambulatory Visit | Attending: Radiation Oncology

## 2023-12-22 DIAGNOSIS — Z510 Encounter for antineoplastic radiation therapy: Secondary | ICD-10-CM | POA: Diagnosis not present

## 2023-12-22 LAB — RAD ONC ARIA SESSION SUMMARY

## 2023-12-22 NOTE — Progress Notes (Signed)
 Specialty Pharmacy Initial Fill Coordination Note  Jose Joseph is a 68 y.o. male contacted today regarding refills of specialty medication(s) Binimetinib  (Mektovi ); Encorafenib  (Braftovi ) .  Patient requested Delivery  on 12/28/23  to verified address [address] [place]  [address]   Medication will be filled on 05/14.   Patient is aware of $0 copayment.   Patty Benjaman Branch, CPhT Oncology Pharmacy Patient Advocate [facility] Direct Number: [phone] Fax: [phone]

## 2023-12-22 NOTE — Telephone Encounter (Signed)
 Patient successfully OnBoarded and drug education provided by pharmacist. Medication scheduled to be shipped on 05/14 for delivery on 05/15 from [facility] to patient's address. Patient also knows to call me at [phone] with any questions or concerns regarding receiving medication or if there is any unexpected change in co-pay.   Patty Benjaman Branch, CPhT Oncology Pharmacy Patient Advocate [facility] Direct Number: [phone] Fax: [phone]

## 2023-12-22 NOTE — Telephone Encounter (Signed)
 Oral Oncology Patient Advocate Encounter   Was successful in enrolling patient in free trial program for Braftovi  & Mektovi .   This will allow a one time 30 day fill of medication at no charge.  The billing information is as follows and has been shared with [facility].   RxBin: [id number] PCN: n/a Member ID: [id number] Group ID: [id number]  Patty Benjaman Branch, CPhT Oncology Pharmacy Patient Advocate [facility] Direct Number: [phone] Fax: [phone]

## 2023-12-22 NOTE — Telephone Encounter (Addendum)
 Clinical Pharmacist Practitioner Encounter   [facility] (Specialty) to deliver medication to patient on 12/28/23. He knows not to start until after his radiation is complete, per MD plan.   Patient Education I spoke with patient for overview of new oral chemotherapy medication: Braftovi  (encorafenib ) and Mektovi  (binimetinib ) for the treatment of Stage IV adenocarcinoma of the lung, BRAF V600E positive (Guardant360), planned duration until disease progression or unacceptable drug toxicity.   Counseled patient on administration, dosing, side effects, monitoring, drug-food interactions, safe handling, storage, and disposal. Patient will take: Encorafenib : Take 6 capsules (450 mg total) by mouth daily.  Binimetinib : Take 3 tablets (45 mg total) by mouth 2 (two) times daily.   Side effects include but not limited to: diarrhea, nausea, fatigue, decreased hgb.   Diarrhea: patient knows to use loperamide as needed and call the office if they are having four or more loose stool per day  Reviewed with patient importance of keeping a medication schedule and plan for any missed doses.  After discussion with patient no patient barriers to medication adherence identified.   Mr. Maceachern voiced understanding and appreciation. All questions answered. Medication handout provided.  Provided patient with Oral Chemotherapy Navigation Clinic phone number. Patient knows to call the office with questions or concerns. Oral Chemotherapy Navigation Clinic will continue to follow.  Johnasia Liese N. Shantavia Jha, PharmD, BCOP, CPP Hematology/Oncology Clinical Pharmacist [facility] Oral Chemotherapy Navigation Clinic [phone]  12/22/2023 1:07 PM

## 2023-12-22 NOTE — Telephone Encounter (Signed)
 Oral Oncology Patient Advocate Encounter  Patient called back and will be moving forward with PAP. Patient does not meet income requirements for Medicare Extra Help. Once I receive the denial letter from the patient I will submit PAP application.  I provided the patient with the Medicare Extra Help program phone number and he knows to call to begin the process of receiving the denial letter.  Patty Benjaman Branch, CPhT Oncology Pharmacy Patient Advocate [facility] Direct Number: [phone] Fax: [phone]

## 2023-12-22 NOTE — Telephone Encounter (Signed)
 Copied from CRM [id number]. Topic: Clinical - Medication Question >> Dec 22, 2023 12:34 PM Eveleen Hinds B wrote: Reason for CRM: Ky from [facility] with questions on Breztri  Aerospere prescription (duration?) Please call [phone].  Spoke with Kaila from [place] she stated pt was approved for Breztri  and nothing further was needed.

## 2023-12-22 NOTE — Telephone Encounter (Signed)
 PA Case: [id number], Status: Approved, Coverage Starts on: 08/16/2023 12:00:00 AM, Coverage Ends on: 08/14/2024 12:00:00 AM. Questions? Contact [phone]. Effective Date: 08/16/2023 Authorization Expiration Date: 08/14/2024

## 2023-12-22 NOTE — Progress Notes (Signed)
 Patient education documented in EPIC note on 12/22/23.

## 2023-12-22 NOTE — Telephone Encounter (Signed)
*  Pulm  Pharmacy Patient Advocate Encounter   Received notification from CoverMyMeds that prior authorization for Breztri  Aerosphere 160-9-4.8MCG/ACT aerosol  is required/requested.   Insurance verification completed.   The patient is insured through [employer] .   Per test claim: PA required; PA submitted to above mentioned insurance via CoverMyMeds Key/confirmation #/EOC [id number] Status is pending

## 2023-12-25 ENCOUNTER — Ambulatory Visit

## 2023-12-25 ENCOUNTER — Other Ambulatory Visit (HOSPITAL_COMMUNITY): Payer: Self-pay

## 2023-12-25 ENCOUNTER — Ambulatory Visit
Admission: RE | Admit: 2023-12-25 | Discharge: 2023-12-25 | Disposition: A | Discharge status: Home / Self Care | Source: Ambulatory Visit | Attending: Radiation Oncology

## 2023-12-25 ENCOUNTER — Other Ambulatory Visit: Payer: Self-pay

## 2023-12-25 DIAGNOSIS — I3139 Other pericardial effusion (noninflammatory): Secondary | ICD-10-CM | POA: Diagnosis not present

## 2023-12-25 DIAGNOSIS — I4819 Other persistent atrial fibrillation: Secondary | ICD-10-CM | POA: Diagnosis not present

## 2023-12-25 LAB — RAD ONC ARIA SESSION SUMMARY

## 2023-12-26 ENCOUNTER — Other Ambulatory Visit: Payer: Self-pay

## 2023-12-26 ENCOUNTER — Encounter (HOSPITAL_COMMUNITY): Payer: Self-pay

## 2023-12-26 ENCOUNTER — Other Ambulatory Visit (HOSPITAL_COMMUNITY)

## 2023-12-26 ENCOUNTER — Emergency Department (HOSPITAL_COMMUNITY)

## 2023-12-26 ENCOUNTER — Ambulatory Visit

## 2023-12-26 ENCOUNTER — Ambulatory Visit
Admission: RE | Admit: 2023-12-26 | Discharge: 2023-12-26 | Disposition: A | Discharge status: Home / Self Care | Source: Ambulatory Visit | Attending: Radiation Oncology | Admitting: Radiation Oncology

## 2023-12-26 ENCOUNTER — Ambulatory Visit (HOSPITAL_BASED_OUTPATIENT_CLINIC_OR_DEPARTMENT_OTHER)
Admission: RE | Admit: 2023-12-26 | Discharge: 2023-12-26 | Disposition: A | Discharge status: Home / Self Care | Source: Ambulatory Visit | Attending: Internal Medicine | Admitting: Internal Medicine

## 2023-12-26 ENCOUNTER — Inpatient Hospital Stay (HOSPITAL_COMMUNITY)
Admission: EM | Admit: 2023-12-26 | Discharge: 2024-01-02 | DRG: 308 | Disposition: A | Discharge status: Home / Self Care | Attending: Internal Medicine | Admitting: Internal Medicine

## 2023-12-26 DIAGNOSIS — J410 Simple chronic bronchitis: Secondary | ICD-10-CM

## 2023-12-26 DIAGNOSIS — I4819 Other persistent atrial fibrillation: Secondary | ICD-10-CM | POA: Diagnosis present

## 2023-12-26 DIAGNOSIS — F32A Depression, unspecified: Secondary | ICD-10-CM | POA: Diagnosis present

## 2023-12-26 DIAGNOSIS — Z6833 Body mass index (BMI) 33.0-33.9, adult: Secondary | ICD-10-CM | POA: Diagnosis not present

## 2023-12-26 DIAGNOSIS — Z515 Encounter for palliative care: Secondary | ICD-10-CM | POA: Diagnosis not present

## 2023-12-26 DIAGNOSIS — I5031 Acute diastolic (congestive) heart failure: Secondary | ICD-10-CM | POA: Diagnosis present

## 2023-12-26 DIAGNOSIS — D72829 Elevated white blood cell count, unspecified: Secondary | ICD-10-CM | POA: Diagnosis present

## 2023-12-26 DIAGNOSIS — R0609 Other forms of dyspnea: Secondary | ICD-10-CM | POA: Diagnosis present

## 2023-12-26 DIAGNOSIS — J910 Malignant pleural effusion: Secondary | ICD-10-CM | POA: Diagnosis present

## 2023-12-26 DIAGNOSIS — R0600 Dyspnea, unspecified: Secondary | ICD-10-CM | POA: Diagnosis not present

## 2023-12-26 DIAGNOSIS — I160 Hypertensive urgency: Secondary | ICD-10-CM | POA: Diagnosis not present

## 2023-12-26 DIAGNOSIS — C7931 Secondary malignant neoplasm of brain: Secondary | ICD-10-CM | POA: Diagnosis present

## 2023-12-26 DIAGNOSIS — Z79899 Other long term (current) drug therapy: Secondary | ICD-10-CM

## 2023-12-26 DIAGNOSIS — C7802 Secondary malignant neoplasm of left lung: Secondary | ICD-10-CM | POA: Diagnosis present

## 2023-12-26 DIAGNOSIS — Z7951 Long term (current) use of inhaled steroids: Secondary | ICD-10-CM

## 2023-12-26 DIAGNOSIS — Z6834 Body mass index (BMI) 34.0-34.9, adult: Secondary | ICD-10-CM

## 2023-12-26 DIAGNOSIS — J441 Chronic obstructive pulmonary disease with (acute) exacerbation: Secondary | ICD-10-CM | POA: Diagnosis present

## 2023-12-26 DIAGNOSIS — I110 Hypertensive heart disease with heart failure: Secondary | ICD-10-CM | POA: Diagnosis present

## 2023-12-26 DIAGNOSIS — Z96642 Presence of left artificial hip joint: Secondary | ICD-10-CM | POA: Diagnosis present

## 2023-12-26 DIAGNOSIS — Z8673 Personal history of transient ischemic attack (TIA), and cerebral infarction without residual deficits: Secondary | ICD-10-CM

## 2023-12-26 DIAGNOSIS — C771 Secondary and unspecified malignant neoplasm of intrathoracic lymph nodes: Secondary | ICD-10-CM | POA: Diagnosis present

## 2023-12-26 DIAGNOSIS — R110 Nausea: Secondary | ICD-10-CM | POA: Diagnosis not present

## 2023-12-26 DIAGNOSIS — R0601 Orthopnea: Secondary | ICD-10-CM | POA: Diagnosis not present

## 2023-12-26 DIAGNOSIS — I4891 Unspecified atrial fibrillation: Secondary | ICD-10-CM | POA: Diagnosis not present

## 2023-12-26 DIAGNOSIS — I959 Hypotension, unspecified: Secondary | ICD-10-CM | POA: Diagnosis not present

## 2023-12-26 DIAGNOSIS — E66811 Obesity, class 1: Secondary | ICD-10-CM | POA: Diagnosis present

## 2023-12-26 DIAGNOSIS — I3139 Other pericardial effusion (noninflammatory): Secondary | ICD-10-CM

## 2023-12-26 DIAGNOSIS — Z8616 Personal history of COVID-19: Secondary | ICD-10-CM | POA: Diagnosis not present

## 2023-12-26 DIAGNOSIS — C3490 Malignant neoplasm of unspecified part of unspecified bronchus or lung: Secondary | ICD-10-CM | POA: Diagnosis not present

## 2023-12-26 DIAGNOSIS — I340 Nonrheumatic mitral (valve) insufficiency: Secondary | ICD-10-CM | POA: Diagnosis not present

## 2023-12-26 DIAGNOSIS — Z8051 Family history of malignant neoplasm of kidney: Secondary | ICD-10-CM

## 2023-12-26 DIAGNOSIS — Z923 Personal history of irradiation: Secondary | ICD-10-CM

## 2023-12-26 DIAGNOSIS — Z87891 Personal history of nicotine dependence: Secondary | ICD-10-CM

## 2023-12-26 DIAGNOSIS — I10 Essential (primary) hypertension: Secondary | ICD-10-CM | POA: Diagnosis not present

## 2023-12-26 DIAGNOSIS — E876 Hypokalemia: Secondary | ICD-10-CM | POA: Diagnosis present

## 2023-12-26 DIAGNOSIS — C7951 Secondary malignant neoplasm of bone: Secondary | ICD-10-CM | POA: Diagnosis present

## 2023-12-26 DIAGNOSIS — Z7189 Other specified counseling: Secondary | ICD-10-CM | POA: Diagnosis not present

## 2023-12-26 DIAGNOSIS — C3411 Malignant neoplasm of upper lobe, right bronchus or lung: Secondary | ICD-10-CM | POA: Diagnosis present

## 2023-12-26 DIAGNOSIS — Z91030 Bee allergy status: Secondary | ICD-10-CM

## 2023-12-26 DIAGNOSIS — C7971 Secondary malignant neoplasm of right adrenal gland: Secondary | ICD-10-CM | POA: Diagnosis present

## 2023-12-26 DIAGNOSIS — Z8042 Family history of malignant neoplasm of prostate: Secondary | ICD-10-CM

## 2023-12-26 DIAGNOSIS — J449 Chronic obstructive pulmonary disease, unspecified: Secondary | ICD-10-CM | POA: Diagnosis present

## 2023-12-26 DIAGNOSIS — I358 Other nonrheumatic aortic valve disorders: Secondary | ICD-10-CM | POA: Diagnosis not present

## 2023-12-26 DIAGNOSIS — I480 Paroxysmal atrial fibrillation: Secondary | ICD-10-CM

## 2023-12-26 HISTORY — DX: Essential (primary) hypertension: I10

## 2023-12-26 HISTORY — DX: Personal history of transient ischemic attack (TIA), and cerebral infarction without residual deficits: Z86.73

## 2023-12-26 HISTORY — DX: Chronic obstructive pulmonary disease, unspecified: J44.9

## 2023-12-26 HISTORY — DX: Other pericardial effusion (noninflammatory): I31.39

## 2023-12-26 HISTORY — DX: Unspecified atrial fibrillation: I48.91

## 2023-12-26 HISTORY — DX: Malignant (primary) neoplasm, unspecified: C80.1

## 2023-12-26 HISTORY — DX: Cerebral infarction, unspecified: I63.9

## 2023-12-26 LAB — RAPID URINE DRUG SCREEN, HOSP PERFORMED
Amphetamines: NOT DETECTED
Barbiturates: NOT DETECTED
Benzodiazepines: NOT DETECTED
Cocaine: NOT DETECTED
Opiates: NOT DETECTED
Tetrahydrocannabinol: POSITIVE — AB

## 2023-12-26 LAB — ECHOCARDIOGRAM COMPLETE
AR max vel: 2.45 cm2
AV Area VTI: 2.53 cm2
AV Area mean vel: 2.41 cm2
AV Mean grad: 3 mmHg
AV Peak grad: 4.7 mmHg
Ao pk vel: 1.08 m/s
S' Lateral: 2.7 cm

## 2023-12-26 LAB — RAD ONC ARIA SESSION SUMMARY

## 2023-12-26 LAB — BASIC METABOLIC PANEL WITH GFR
Anion gap: 12 (ref 5–15)
BUN: 19 mg/dL (ref 8–23)
CO2: 25 mmol/L (ref 22–32)
Calcium: 9.7 mg/dL (ref 8.9–10.3)
Chloride: 102 mmol/L (ref 98–111)
Creatinine, Ser: 0.75 mg/dL (ref 0.61–1.24)
GFR, Estimated: >60 mL/min (ref >60)
Glucose, Bld: 89 mg/dL (ref 70–99)
Potassium: 3.4 mmol/L — ABNORMAL LOW (ref 3.5–5.1)
Sodium: 139 mmol/L (ref 135–145)

## 2023-12-26 LAB — CBC
HCT: 47.3 % (ref 39.0–52.0)
Hemoglobin: 15.4 g/dL (ref 13.0–17.0)
MCH: 30.3 pg (ref 26.0–34.0)
MCHC: 32.6 g/dL (ref 30.0–36.0)
MCV: 93.1 fL (ref 80.0–100.0)
Platelets: 192 10*3/uL (ref 150–400)
RBC: 5.08 MIL/uL (ref 4.22–5.81)
RDW: 12.7 % (ref 11.5–15.5)
WBC: 16.1 10*3/uL — ABNORMAL HIGH (ref 4.0–10.5)
nRBC: 0 % (ref 0.0–0.2)

## 2023-12-26 LAB — TROPONIN I (HIGH SENSITIVITY)
Troponin I (High Sensitivity): 57 ng/L — ABNORMAL HIGH (ref <18)
Troponin I (High Sensitivity): 57 ng/L — ABNORMAL HIGH (ref <18)

## 2023-12-26 LAB — PROCALCITONIN: Procalcitonin: <0.1 ng/mL

## 2023-12-26 MED ORDER — DILTIAZEM HCL 25 MG/5ML IV SOLN
20.0000 mg | Freq: Once | INTRAVENOUS | Status: AC
  Administered 2023-12-26: 20 mg via INTRAVENOUS
  Filled 2023-12-26: qty 5

## 2023-12-26 MED ORDER — LACTATED RINGERS IV SOLN: INTRAVENOUS | Status: DC

## 2023-12-26 MED ORDER — IOHEXOL 350 MG/ML SOLN
75.0000 mL | Freq: Once | INTRAVENOUS | Status: AC | PRN
  Administered 2023-12-26: 75 mL via INTRAVENOUS

## 2023-12-26 MED ORDER — DILTIAZEM HCL-DEXTROSE 125-5 MG/125ML-% IV SOLN (PREMIX)
5.0000 mg/h | INTRAVENOUS | Status: DC
  Administered 2023-12-26: 5 mg/h via INTRAVENOUS
  Administered 2023-12-27 (×2): 10 mg/h via INTRAVENOUS
  Administered 2023-12-29 – 2023-12-30 (×2): 5 mg/h via INTRAVENOUS
  Administered 2023-12-31: 7.5 mg/h via INTRAVENOUS
  Administered 2024-01-02: 5 mg/h via INTRAVENOUS
  Filled 2023-12-26 (×9): qty 125

## 2023-12-26 MED ORDER — SODIUM CHLORIDE 0.9 % IV SOLN
500.0000 mg | INTRAVENOUS | Status: DC
  Administered 2023-12-26 – 2023-12-29 (×3): 500 mg via INTRAVENOUS
  Filled 2023-12-26 (×4): qty 5

## 2023-12-26 MED ORDER — LACTATED RINGERS IV BOLUS
500.0000 mL | Freq: Once | INTRAVENOUS | Status: AC
  Administered 2023-12-26: 500 mL via INTRAVENOUS

## 2023-12-26 MED ORDER — SODIUM CHLORIDE 0.9 % IV SOLN
1.0000 g | INTRAVENOUS | Status: DC
  Administered 2023-12-26 – 2023-12-29 (×4): 1 g via INTRAVENOUS
  Filled 2023-12-26 (×4): qty 10

## 2023-12-26 NOTE — Consult Note (Addendum)
 Cardiology Consultation   Patient ID: Jose Joseph MRN: [id number]; DOB: 02-12-1956  Admit date: 12/26/2023 Date of Consult: 12/26/2023  PCP:  Artemisa Bile, MD    [facility]Care Providers Cardiologist:  Olinda Bertrand, DO   (NEW)   Patient Profile:   Jose Joseph is a 68 y.o. male with a hx of stage IV NSCLC with bone mets who is being seen 12/26/2023 for the evaluation of pericardial effusion at the request of Dr. Julane Ny.  History of Present Illness:   Mr. Bibey was diagnosed with stage IV NSCLC in January of this year in [place]. He was traveling with his wife and developed a cold prompting evaluation with CT chest which showed evidence of malignancy. Workup with oncology in [place]. Biopsy 11/21/23 with adenocarcinoma.  He has metastatic disease to his sternum and rib. He is currently receiving radiation to bone mets. He had a port placed 12/14/23 and was noted to be hypertensive during the procedure. He is due to start chemo 01/01/24. Recently treated for COPD exacerbation with medrol  dose pak and ABX. Given onset of new chemo and HTN, echocardiogram was ordered and showed a large pericardial effusion without tamponade.   I was called urgently to evaluate the patient in [facility]. On exam, he confirmed no prior cardiac history, MI, or PCI. He does report his son had pericarditis with pericardial effusion prompting pericardiocentesis, no recurrence.   Pt states he has not been able to sleep flat for several weeks, at least 2 weeks. He is tripoding on exam with O2 94% on room air. He is tachycardic in the 110s and hypertensive in the 150s.    Past Medical History:  Diagnosis Date   Arthritis    Asthma    Complication of anesthesia    after his neck surgery pt. trying to get up and walk out   Depression    Dyspnea    when he was smoking   History of kidney stones    2021   Pneumonia    during COVID in 2021    Past Surgical History:  Procedure Laterality Date   BRONCHIAL BIOPSY   11/21/2023   Procedure: BRONCHOSCOPY, WITH BIOPSY;  Surgeon: Denson Flake, MD;  Location: [facility] ENDOSCOPY;  Service: Pulmonary;;   BRONCHIAL NEEDLE ASPIRATION BIOPSY  11/21/2023   Procedure: BRONCHOSCOPY, WITH NEEDLE ASPIRATION BIOPSY;  Surgeon: Denson Flake, MD;  Location: [facility] ENDOSCOPY;  Service: Pulmonary;;   COLONOSCOPY WITH PROPOFOL  N/A 01/19/2023   Procedure: COLONOSCOPY WITH PROPOFOL ;  Surgeon: Suzette Espy, MD;  Location: [facility] ENDO SUITE;  Service: Endoscopy;  Laterality: N/A;  200pm, asa 2   ENDOBRONCHIAL ULTRASOUND Bilateral 11/21/2023   Procedure: ENDOBRONCHIAL ULTRASOUND (EBUS);  Surgeon: Denson Flake, MD;  Location: [facility] ENDOSCOPY;  Service: Pulmonary;  Laterality: Bilateral;   HERNIA REPAIR     IR IMAGING GUIDED PORT INSERTION  12/14/2023   KNEE ARTHROCENTESIS     NECK SURGERY     POLYPECTOMY  01/19/2023   Procedure: POLYPECTOMY;  Surgeon: Suzette Espy, MD;  Location: [facility] ENDO SUITE;  Service: Endoscopy;;   TOTAL HIP ARTHROPLASTY Left 01/15/2021   Procedure: LEFT TOTAL HIP ARTHROPLASTY ANTERIOR APPROACH;  Surgeon: Adah Acron, MD;  Location: [facility] OR;  Service: Orthopedics;  Laterality: Left;   VIDEO BRONCHOSCOPY WITH ENDOBRONCHIAL NAVIGATION Bilateral 11/21/2023   Procedure: VIDEO BRONCHOSCOPY WITH ENDOBRONCHIAL NAVIGATION;  Surgeon: Denson Flake, MD;  Location: [facility] ENDOSCOPY;  Service: Pulmonary;  Laterality: Bilateral;   WRIST ARTHROPLASTY  Home Medications:  Prior to Admission medications   Medication Sig Start Date End Date Taking? Authorizing Provider  albuterol  (VENTOLIN  HFA) 108 (90 Base) MCG/ACT inhaler Inhale 2 puffs into the lungs every 6 (six) hours as needed for wheezing or shortness of breath. 10/16/23   Raejean Bullock, NP  amoxicillin -clavulanate (AUGMENTIN ) 875-125 MG tablet Take 1 tablet by mouth 2 (two) times daily. 12/21/23   Paulett Boros, MD  binimetinib  (MEKTOVI ) 15 MG tablet Take 3 tablets (45 mg total) by mouth 2 (two) times daily. 12/21/23    Paulett Boros, MD  budeson-glycopyrrolate-formoterol (BREZTRI  AEROSPHERE) 160-9-4.8 MCG/ACT AERO inhaler Inhale 2 puffs into the lungs in the morning and at bedtime. 12/20/23   Raejean Bullock, NP  cetirizine (ZYRTEC) 10 MG tablet Take 10 mg by mouth in the morning.    [provider]  citalopram  (CELEXA ) 20 MG tablet Take 20 mg by mouth in the morning.    [provider]  encorafenib  (BRAFTOVI ) 75 MG capsule Take 6 capsules (450 mg total) by mouth daily. 12/21/23   Katragadda, Sreedhar, MD  fexofenadine (ALLEGRA) 180 MG tablet Take 180 mg by mouth in the morning.    [provider]  Fluticasone-Umeclidin-Vilant (TRELEGY ELLIPTA ) 100-62.5-25 MCG/ACT AEPB Inhale 1 puff into the lungs daily. 11/10/23   Raejean Bullock, NP  Fluticasone-Umeclidin-Vilant (TRELEGY ELLIPTA ) 100-62.5-25 MCG/ACT AEPB Inhale 1 puff into the lungs daily at 2 PM. 11/10/23   Raejean Bullock, NP  predniSONE  (DELTASONE ) 10 MG tablet Take 40mg  daily for 3 days, then 30mg  daily for 3 days, then 20mg  daily for 3 days, then 10mg  daily for 3 days, then stop 12/15/23   Byrum, Robert S, MD  prochlorperazine  (COMPAZINE ) 10 MG tablet Take 1 tablet (10 mg total) by mouth every 6 (six) hours as needed. 12/21/23   Paulett Boros, MD    Inpatient Medications: Scheduled Meds:  Continuous Infusions:  PRN Meds:   Allergies:    Allergies  Allergen Reactions   Bee Venom Anaphylaxis    syncope    Social History:   Social History   Socioeconomic History   Marital status: Married    Spouse name: Not on file   Number of children: 2   Years of education: Not on file   Highest education level: Not on file  Occupational History   Occupation: retired  Tobacco Use   Smoking status: Former    Current packs/day: 0.00    Average packs/day: 1 pack/day for 35.0 years (35.0 ttl pk-yrs)    Types: Cigarettes    Start date: 45    Quit date: 2014    Years since quitting: 11.3    Passive exposure: Past    Smokeless tobacco: Never  Vaping Use   Vaping status: Never Used  Substance and Sexual Activity   Alcohol use: Not Currently    Alcohol/week: 2.0 standard drinks of alcohol    Types: 2 Cans of beer per week   Drug use: Yes    Types: Marijuana    Comment: last: 1 week ago   Sexual activity: Not on file  Other Topics Concern   Not on file  Social History Narrative   Not on file   Social Drivers of Health   Financial Resource Strain: Not on file  Food Insecurity: No Food Insecurity (12/12/2023)   Hunger Vital Sign    Worried About Running Out of Food in the Last Year: Never true    Ran Out of Food in the Last Year:  Never true  Transportation Needs: No Transportation Needs (12/12/2023)   PRAPARE - Administrator, Civil Service (Medical): No    Lack of Transportation (Non-Medical): No  Physical Activity: Not on file  Stress: Not on file  Social Connections: Not on file  Intimate Partner Violence: Not At Risk (12/12/2023)   Humiliation, Afraid, Rape, and Kick questionnaire    Fear of Current or Ex-Partner: No    Emotionally Abused: No    Physically Abused: No    Sexually Abused: No    Family History:    Family History  Problem Relation Age of Onset   Kidney cancer Sister    Prostate cancer Brother      ROS:  Please see the history of present illness.   All other ROS reviewed and negative.     Physical Exam/Data:   Vitals:   12/26/23 1020 12/26/23 1021 12/26/23 1200  BP:  (!) 167/109 (!) 157/100  Pulse:  (!) 109 (!) 114  Resp:  20 20  Temp:  97.7 F (36.5 C)   TempSrc:  Oral   SpO2:  94% 94%  Weight: 118.4 kg    Height: 6\' 1"  (1.854 m)     No intake or output data in the 24 hours ending 12/26/23 1301    12/26/2023   10:20 AM 12/21/2023   10:13 AM 12/15/2023   11:42 AM  Last 3 Weights  Weight (lbs) 261 lb 266 lb 264 lb  Weight (kg) 118.389 kg 120.657 kg 119.75 kg     Body mass index is 34.43 kg/m.  General:  male appears SOB, tripoding HEENT:  normal Neck: no JVD Vascular: No carotid bruits; Distal pulses 2+ bilaterally Cardiac:  regular rhythm, tachycardic rate in the 110s Lungs:  cough on exam, lungs clear in upper lobes, diminished in bases R >L  Abd: soft, nontender, no hepatomegaly  Ext: no edema Musculoskeletal:  No deformities, BUE and BLE strength normal and equal Skin: warm and dry  Neuro:  CNs 2-12 intact, no focal abnormalities noted Psych:  Normal affect   EKG:  The EKG was personally reviewed and demonstrates:  sinus tachycardia with HR 116, PAC Telemetry:  Telemetry was personally reviewed and demonstrates:  sinus tachycardia in the 110s  Relevant CV Studies:  Echo 12/25/23:  1. Left ventricular ejection fraction, by estimation, is 55 to 60%. The  left ventricle has normal function. The left ventricle has no regional  wall motion abnormalities. Left ventricular diastolic parameters are  consistent with Grade I diastolic  dysfunction (impaired relaxation).   2. Right ventricular systolic function is normal. The right ventricular  size is normal.   3. Large pericardial effusion. The pericardial effusion is  circumferential. There is no evidence of cardiac tamponade.   4. The mitral valve is normal in structure. Trivial mitral valve  regurgitation. No evidence of mitral stenosis.   5. The aortic valve is tricuspid. There is mild calcification of the  aortic valve. Aortic valve regurgitation is not visualized. Aortic valve  sclerosis/calcification is present, without any evidence of aortic  stenosis.   6. The inferior vena cava is dilated in size with >50% respiratory  variability, suggesting right atrial pressure of 8 mmHg.    Laboratory Data:  High Sensitivity Troponin:   Recent Labs  Lab 12/26/23 1026  TROPONINIHS 57*     Chemistry Recent Labs  Lab 12/26/23 1026  NA 139  K 3.4*  CL 102  CO2 25  GLUCOSE 89  BUN  19  CREATININE 0.75  CALCIUM 9.7  GFRNONAA >60  ANIONGAP 12    No results  for input(s): "PROT", "ALBUMIN", "AST", "ALT", "ALKPHOS", "BILITOT" in the last 168 hours. Lipids No results for input(s): "CHOL", "TRIG", "HDL", "LABVLDL", "LDLCALC", "CHOLHDL" in the last 168 hours.  Hematology Recent Labs  Lab 12/26/23 1026  WBC 16.1*  RBC 5.08  HGB 15.4  HCT 47.3  MCV 93.1  MCH 30.3  MCHC 32.6  RDW 12.7  PLT 192   Thyroid No results for input(s): "TSH", "FREET4" in the last 168 hours.  BNPNo results for input(s): "BNP", "PROBNP" in the last 168 hours.  DDimer No results for input(s): "DDIMER" in the last 168 hours.   Radiology/Studies:  DG Chest 2 View Result Date: 12/26/2023 CLINICAL DATA:  Shortness of breath.  Known malignancy EXAM: CHEST - 2 VIEW COMPARISON:  X-ray 12/15/2023 FINDINGS: Stable right IJ chest port. Tip along the central SVC. Stable cardiopericardial silhouette with tortuous aorta ectatic aorta. Underinflated x-rays which are under penetrated. Bronchovascular crowding. The bandlike opacity right perihilar has improved from previous. Slightly more opacity at the medial right lung base however. No pneumothorax or effusion. Overlapping cardiac leads. IMPRESSION: Changing opacity. Improving right mid lung perihilar. Increasing right lung base. Recommend continued follow-up. Decreasing left lung base opacity. Electronically Signed   By: Adrianna Horde M.D.   On: 12/26/2023 12:29   ECHOCARDIOGRAM COMPLETE Result Date: 12/26/2023    ECHOCARDIOGRAM REPORT   Patient Name:   METE STAHLBERG Date of Exam: 12/26/2023 Medical Rec #:  [id number]     Height:       73.0 in Accession #:    [id number]    Weight:       266.0 lb Date of Birth:  23-Feb-1956     BSA:          2.429 m Patient Age:    67 years      BP:           134/77 mmHg Patient Gender: M             HR:           114 bpm. Exam Location:  Inpatient Procedure: 2D Echo, Cardiac Doppler and Color Doppler (Both Spectral and Color            Flow Doppler were utilized during procedure). Indications:    Chemo Z09   History:        Patient has no prior history of Echocardiogram examinations.  Sonographer:    Astrid Blamer Referring Phys: [phone] Paulett Boros IMPRESSIONS  1. Left ventricular ejection fraction, by estimation, is 55 to 60%. The left ventricle has normal function. The left ventricle has no regional wall motion abnormalities. Left ventricular diastolic parameters are consistent with Grade I diastolic dysfunction (impaired relaxation).  2. Right ventricular systolic function is normal. The right ventricular size is normal.  3. Large pericardial effusion. The pericardial effusion is circumferential. There is no evidence of cardiac tamponade.  4. The mitral valve is normal in structure. Trivial mitral valve regurgitation. No evidence of mitral stenosis.  5. The aortic valve is tricuspid. There is mild calcification of the aortic valve. Aortic valve regurgitation is not visualized. Aortic valve sclerosis/calcification is present, without any evidence of aortic stenosis.  6. The inferior vena cava is dilated in size with >50% respiratory variability, suggesting right atrial pressure of 8 mmHg. Conclusion(s)/Recommendation(s): There is a moderate to large circumfrential pericardial effusion without over tamponade. Will contact patient  for cardiology follow-up. FINDINGS  Left Ventricle: Left ventricular ejection fraction, by estimation, is 55 to 60%. The left ventricle has normal function. The left ventricle has no regional wall motion abnormalities. The left ventricular internal cavity size was normal in size. There is  no left ventricular hypertrophy. Left ventricular diastolic parameters are consistent with Grade I diastolic dysfunction (impaired relaxation). Right Ventricle: The right ventricular size is normal. No increase in right ventricular wall thickness. Right ventricular systolic function is normal. Left Atrium: Left atrial size was normal in size. Right Atrium: Right atrial size was normal in size.  Pericardium: A large pericardial effusion is present. The pericardial effusion is circumferential. There is no evidence of cardiac tamponade. Mitral Valve: The mitral valve is normal in structure. Trivial mitral valve regurgitation. No evidence of mitral valve stenosis. Tricuspid Valve: The tricuspid valve is normal in structure. Tricuspid valve regurgitation is not demonstrated. No evidence of tricuspid stenosis. Aortic Valve: The aortic valve is tricuspid. There is mild calcification of the aortic valve. Aortic valve regurgitation is not visualized. Aortic valve sclerosis/calcification is present, without any evidence of aortic stenosis. Aortic valve mean gradient measures 3.0 mmHg. Aortic valve peak gradient measures 4.7 mmHg. Aortic valve area, by VTI measures 2.53 cm. Pulmonic Valve: The pulmonic valve was not well visualized. Pulmonic valve regurgitation is not visualized. No evidence of pulmonic stenosis. Aorta: The aortic root is normal in size and structure. Venous: The inferior vena cava is dilated in size with greater than 50% respiratory variability, suggesting right atrial pressure of 8 mmHg. IAS/Shunts: No atrial level shunt detected by color flow Doppler.  LEFT VENTRICLE PLAX 2D LVIDd:         3.70 cm LVIDs:         2.70 cm LV PW:         1.00 cm LV IVS:        0.80 cm LVOT diam:     2.00 cm LV SV:         44 LV SV Index:   18 LVOT Area:     3.14 cm  RIGHT VENTRICLE RV S prime:     19.10 cm/s TAPSE (M-mode): 2.4 cm LEFT ATRIUM             Index        RIGHT ATRIUM           Index LA Vol (A2C):   31.7 ml 13.05 ml/m  RA Area:     17.00 cm LA Vol (A4C):   26.3 ml 10.83 ml/m  RA Volume:   49.70 ml  20.46 ml/m LA Biplane Vol: 28.9 ml 11.90 ml/m  AORTIC VALVE AV Area (Vmax):    2.45 cm AV Area (Vmean):   2.41 cm AV Area (VTI):     2.53 cm AV Vmax:           108.00 cm/s AV Vmean:          80.800 cm/s AV VTI:            0.175 m AV Peak Grad:      4.7 mmHg AV Mean Grad:      3.0 mmHg LVOT Vmax:          84.20 cm/s LVOT Vmean:        62.100 cm/s LVOT VTI:          0.141 m LVOT/AV VTI ratio: 0.81  AORTA Ao Root diam: 3.20 cm  SHUNTS Systemic VTI:  0.14 m Systemic Diam:  2.00 cm Jules Oar MD Electronically signed by Jules Oar MD Signature Date/Time: 12/26/2023/10:24:16 AM    Final      Assessment and Plan:   Large pericardial effusion - without signs of tamponade on echocardiogram - pt reports orthopnea and is tripoding on exam with cough - he is tachycardic with hypertension - pericardial effusion present prior to starting chemo - suspect may be related to metastatic lung cancer, but with symptoms we may need to consider transferring to [facility] for consideration for pericardiocentesis    Stage IV NSCLC with bone mets - currently receiving radiation - due to start chemo next week   COPD - no longer smokes cigarettes - recently treated with COPD exacerbation  Risk Assessment/Risk Scores:     For questions or updates, please contact [facility] Please consult [email or web address] for contact info under    Signed, Lamond Pilot, [place]  12/26/2023 12:04 PM  ADDENDUM:   Patient seen and examined with Lamond Pilot, PA .  I personally taken a history, examined the patient, reviewed relevant notes,  laboratory data / imaging studies.  I performed a substantive portion of this encounter and formulated the important aspects of the plan.  I agree with the APP's note, impression, and recommendations; however, I have edited the note to reflect changes where needed.  Patient is diagnosed with lung cancer in January 2025 and currently undergoing radiation therapy.  When he had his port placed earlier this month he was noted to be hypertensive and therefore an echocardiogram was ordered for further evaluation.  Otherwise he has no prior cardiovascular history.  He had an echocardiogram earlier today which notes preserved LVEF with reported large pericardial effusion.  On  personal review the effusion is moderate to large but circumferential nonetheless.  No tamponade physiology per report.  Of note, images are suboptimal.  Patient was referred to the ED for further evaluation and management.  Cardiology consulted.  Patient is accompanied by his wife at bedside.  He denies anginal chest pain but endorses shortness of breath that is getting progressive over the last several weeks.  He endorses orthopnea, PND.  No lower extremity swelling.  Patient prefers to sit upright in a tripod position for symptom relief.  PHYSICAL EXAM: Today's Vitals   12/26/23 1020 12/26/23 1021 12/26/23 1200  BP:  (!) 167/109 (!) 157/100  Pulse:  (!) 109 (!) 114  Resp:  20 20  Temp:  97.7 F (36.5 C)   TempSrc:  Oral   SpO2:  94% 94%  Weight: 118.4 kg    Height: 6\' 1"  (1.854 m)    PainSc: 5      Body mass index is 34.43 kg/m.   Net IO Since Admission: No IO data has been entered for this period [12/26/23 1301]  Filed Weights   12/26/23 1020  Weight: 118.4 kg    General: Age appropriate, hemodynamically stable, mild distress due to dyspnea HEENT: Moist membranes, trachea midline, JVP, port present Lungs: Decreased breath sounds bilaterally, no rales or rhonchi Heart: Tachycardic, positive S1-S2, no murmurs rubs or gallops appreciated secondary to tachycardia Abdomen: Soft, nontender, nondistended, positive bowel sounds in all 4 quadrants Extremities: Cool to touch, trace pitting edema, no cyanosis or clubbing Neuro: Moves all 4 extremities, alert oriented x 4  EKG: (personally reviewed by me) 12/26/2023 ST 116bpm, without underlying injury pattern, PACs  Telemetry: (personally reviewed by me) Sinus tachycardia  MRI w/ and /wo contrast:  April 2025 1. Small foci of  diffusion-weighted signal abnormality within the anterior right frontal lobe, right parietooccipital lobes, left parietal lobe and right cerebellar hemisphere (measuring up to 5 mm). There is no  appreciable corresponding pathologic enhancement at these sites, and these likely reflect acute/subacute infarcts (involvement of multiple vascular territories concerning for an embolic process). However, a short-interval follow-up brain MRI is recommended in 6 weeks to ensure expected evolution and to exclude poorly enhancing metastases. 2. Background T2 FLAIR hyperintense signal changes within the cerebral white matter (moderate) and pons (mild), nonspecific but most often secondary to chronic small vessel ischemia. 3. 10 mm left maxillary sinus mucous retention cyst.   Impression:  Pericardial effusion Progressive dyspnea-at rest and with effort related activities Hypertension urgency Stage IV adenocarcinoma of the lung with metastasis to the bone COPD Former smoker-quit 10 years ago Acute/subacute infarcts-MRI brain April 2025  Recommendations:  Patient had an echocardiogram electively for newly discovered benign essential hypertension.  Echocardiogram notes preserved LVEF with moderate to large pericardial effusion on personal review.  No obvious findings of tamponade physiology but image quality is suboptimal.  Patient presents to the hospital for further evaluation and management.  Clinically he is quite symptomatic with shortness of breath at rest and with also effort related activities.  Patient states that he prefers to sit upright at the side of the bed for comfort.  He also endorses orthopnea and PND.  He has been diagnosed with stage IV adenocarcinoma of the lung with metastasis.  He also has had MRI of the brain in April 2025 which noted at that time acute/subacute infarcts.  For cardiovascular standpoint he denies true anginal chest pain.  No prior CAD, DVT/PE.  Patient is tachycardic on physical examination and hypertensive.  Clinically he may benefit from pericardiocentesis after being evaluated by interventional cardiology.  This would be both therapeutic and  diagnostic.  I have reached out to Dr. Addie Holstein will await the phone call.  With the degree of pericardial effusion I suspect that he would be tachycardic as well as hypotensive.  However he is hypertensive.  He is not on antihypertensive medications at home.  He also denies use of recreational drugs besides marijuana.  Will check UDS.  Would also like to rule out PE prior to any elective procedures as he is at high pretest probability given his stage IV cancer and dyspnea.  Renal function is acceptable.  D-dimer likely positive given his comorbidities.    Recommend admission to medicine -given his complex noncardiac history and comorbidities.  And given the acuity would better be served inpatient at [facility] as opposed to [facility].  Plan of care discussed with the patient, wife, ER physician Dr. Synetta Eves, reached out to interventional cardiology.  Cardiology will follow.  Further recommendations to follow as the case evolves.  CRITICAL CARE Performed by: Olinda Bertrand   Total critical care time: 42 minutes   Critical care time was exclusive of separately billable procedures and treating other patients.   Critical care was necessary to treat or prevent imminent or life-threatening deterioration with severe symptomatic dyspnea-working diagnosis secondary to underlying lung cancer and pericardial effusion.   Critical care was time spent personally by me on the following activities: development of treatment plan with patient and/or surrogate as well as nursing, discussions with consultants, evaluation of patient's response to treatment, examination of patient, obtaining history from patient or surrogate, ordering and performing treatments and interventions, ordering and review of laboratory studies, ordering and review of radiographic studies, pulse oximetry and  re-evaluation of patient's condition.  This note was created using a voice recognition software as a result there may be grammatical errors  inadvertently enclosed that do not reflect the nature of this encounter. Every attempt is made to correct such errors.   Awilda Bogus, [facility]  [facility]  Pager: [phone] Office: [phone] 12/26/2023 1:01 PM

## 2023-12-26 NOTE — Progress Notes (Addendum)
       Overnight   NAME: Jose Joseph MRN: [id number] DOB : [birth year]    Date of Service   12/26/2023   HPI/Events of Note    Notified by RN for A-fib RVR on admitted patient awaiting transfer to [facility]- (Main facility) for Cardiology Services.  Spoke with ER Physician and Overnight Attending. Overnight Cardiology Fellow advises do not give any IV Lasix and do not restart any IV heparin  drip.   Cardizem bolus was ordered by ER Physician. Rate has decreased, however patient is still with HR > 120 bpm and in Afib.  Nursing is contacting bed placement for ETA of transport/Room availability. Bedside : A&O x4 SPO2 and Vitals maintained, 1-2 LPM O2 [place] for assistance. Has a port placed on 12/14/23 and pending Chemo 01/01/24.    Interventions/ Plan   Begin Cardizem Drip IV Fluid bolus  Begin  - IVF (Maintenance) Labs- pending Continue all previous Attending and Cardiology(specialty) orders.      Denece Finger BSN MSNA MSN ACNPC-AG Acute Care Nurse Practitioner [facility]

## 2023-12-26 NOTE — Progress Notes (Incomplete)
 New onset of A-fib RVR Pericardial effusion -Patient being admitted for pericardial effusion awaiting to transfer to [facility] to be seen by CT surgery for pericardial window in the daytime.   Overnight, while in the ED bedside monitor showed atrial fibrillation and EKG obtained which showed A-fib RVR heart rate 178. -Echo showed grade 1 diastolic heart failure, preserved EF 55 to 60%, and large pericardial effusion without any evidence of tamponade.  - In the ED patient received Cardizem 20 mg bolus and currently on Cardizem drip.  Blood pressure is borderline soft.  Status post 500 mL of LR bolus and continue maintenance fluid LR 100 cc/h - Spoke with on-call cardiology fellow Dr. Jeryl Moris who agrees to continue IV Cardizem drip and recommended if blood pressure allows start oral metoprolol 12.5 or 25 mg every 6 hours.  Recommended need to avoid any IV Lasix.  Also agrees to hold off any IV heparin  drip as tentative plan for pericardial window by CT surgery possibly in the daytime.  -Checking TSH, BMP, mag, troponin and BNP level. -Waiting for transfer to progressive unit at [facility]. -[facility] will  be the primary  and cardiology service will continue to follow as consulting service.  Jose Rio, MD [facility] 12/27/2023, 12:41 AM

## 2023-12-26 NOTE — ED Provider Notes (Signed)
 Pt is currently waiting for transfer to [facility].  I was asked to evaluate pt as nurse noted tachycardia.  Cardiac monitor suggests a fib rvr.  Pt denies any acute changes.  He wonders when he is going to be admitted at [facility].  EKG ordered.  Dose of cardizem ordered.  Nurse will contact hospitalist service that is admitting pt to the hospital.   Trish Furl, MD 12/26/23 [id number]

## 2023-12-26 NOTE — Progress Notes (Signed)
 New Hematology/Oncology Consult   Requesting JX:[id number] Rai       Reason for Consult: Non-small cell lung cancer  HPI: Jose Joseph was found to have a right lung mass and lung nodules on imaging in [place] in December 2024.  He was living in [place] at the time and returned to [place]  for further evaluation. He was referred to Dr. Baldwin Levee.  A PET on 10/19/2023 revealed hypermetabolic pulmonary nodules and a spiculated right upper lobe lesion felt to represent a primary tumor.  There are hypermetabolic mediastinal and hilar nodes in addition to metastatic disease involving the sternum, left sixth rib, and right iliac bone. He underwent a bronchoscopy and biopsy of a right upper lung nodule on 11/21/2023.  The cytology revealed adenocarcinoma, TTF-1 positive and p40 negative.  Level 4R and 11R lymph node biopsies lymph node also returned as adenocarcinoma.   He was referred to Dr. Cheree Cords.  Additional testing found the tumor to have a BRAF V600 E alteration, and the PD-L1 TPS score returned at 95%.  He is scheduled to begin treatment with encorafenib  and binimetinib  on 01/01/2024.  He was referred to Dr. Jeryl Moris to consider palliative radiation to the dominant right lung mass and bone lesions.  He began radiation 12/15/2023. Jose Joseph had an echocardiogram today.  This revealed a moderate to large pericardial effusion without tamponade.  He was referred to the emergency room for further evaluation.     Past Medical History:  Diagnosis Date   Arthritis    Asthma    Complication of anesthesia    after his neck surgery pt. trying to get up and walk out   Depression    Dyspnea    when he was smoking   History of kidney stones    2021   Pneumonia    during COVID in 2021  :   Past Surgical History:  Procedure Laterality Date   BRONCHIAL BIOPSY  11/21/2023   Procedure: BRONCHOSCOPY, WITH BIOPSY;  Surgeon: Denson Flake, MD;  Location: [facility] ENDOSCOPY;  Service: Pulmonary;;   BRONCHIAL  NEEDLE ASPIRATION BIOPSY  11/21/2023   Procedure: BRONCHOSCOPY, WITH NEEDLE ASPIRATION BIOPSY;  Surgeon: Denson Flake, MD;  Location: [facility] ENDOSCOPY;  Service: Pulmonary;;   COLONOSCOPY WITH PROPOFOL  N/A 01/19/2023   Procedure: COLONOSCOPY WITH PROPOFOL ;  Surgeon: Suzette Espy, MD;  Location: [facility] ENDO SUITE;  Service: Endoscopy;  Laterality: N/A;  200pm, asa 2   ENDOBRONCHIAL ULTRASOUND Bilateral 11/21/2023   Procedure: ENDOBRONCHIAL ULTRASOUND (EBUS);  Surgeon: Denson Flake, MD;  Location: [facility] ENDOSCOPY;  Service: Pulmonary;  Laterality: Bilateral;   HERNIA REPAIR     IR IMAGING GUIDED PORT INSERTION  12/14/2023   KNEE ARTHROCENTESIS     NECK SURGERY     POLYPECTOMY  01/19/2023   Procedure: POLYPECTOMY;  Surgeon: Suzette Espy, MD;  Location: [facility] ENDO SUITE;  Service: Endoscopy;;   TOTAL HIP ARTHROPLASTY Left 01/15/2021   Procedure: LEFT TOTAL HIP ARTHROPLASTY ANTERIOR APPROACH;  Surgeon: Adah Acron, MD;  Location: [facility] OR;  Service: Orthopedics;  Laterality: Left;   VIDEO BRONCHOSCOPY WITH ENDOBRONCHIAL NAVIGATION Bilateral 11/21/2023   Procedure: VIDEO BRONCHOSCOPY WITH ENDOBRONCHIAL NAVIGATION;  Surgeon: Denson Flake, MD;  Location: [facility] ENDOSCOPY;  Service: Pulmonary;  Laterality: Bilateral;   WRIST ARTHROPLASTY    :   Current Facility-Administered Medications:    azithromycin  (ZITHROMAX ) 500 mg in sodium chloride  0.9 % 250 mL IVPB, 500 mg, Intravenous, Q24H, Rai, Ripudeep K, MD, Last Rate: 250 mL/hr at  12/26/23 1657, 500 mg at 12/26/23 1657   cefTRIAXone (ROCEPHIN) 1 g in sodium chloride  0.9 % 100 mL IVPB, 1 g, Intravenous, Q24H, Rai, Ripudeep K, MD, Stopped at 12/26/23 1653  Current Outpatient Medications:    albuterol  (VENTOLIN  HFA) 108 (90 Base) MCG/ACT inhaler, Inhale 2 puffs into the lungs every 6 (six) hours as needed for wheezing or shortness of breath., Disp: 8 g, Rfl: 2   amoxicillin -clavulanate (AUGMENTIN ) 875-125 MG tablet, Take 1 tablet by mouth 2 (two) times  Joseph., Disp: 14 tablet, Rfl: 0   budeson-glycopyrrolate-formoterol (BREZTRI  AEROSPHERE) 160-9-4.8 MCG/ACT AERO inhaler, Inhale 2 puffs into the lungs in the morning and at bedtime., Disp: 10.2 g, Rfl: 3   cetirizine (ZYRTEC) 10 MG tablet, Take 10 mg by mouth in the morning., Disp: , Rfl:    citalopram  (CELEXA ) 20 MG tablet, Take 20 mg by mouth in the morning., Disp: , Rfl:    fexofenadine (ALLEGRA) 180 MG tablet, Take 180 mg by mouth in the morning., Disp: , Rfl:    Fluticasone-Umeclidin-Vilant (TRELEGY ELLIPTA ) 100-62.5-25 MCG/ACT AEPB, Inhale 1 puff into the lungs Joseph., Disp: 1 each, Rfl: 6   predniSONE  (DELTASONE ) 10 MG tablet, Take 40mg  Joseph for 3 days, then 30mg  Joseph for 3 days, then 20mg  Joseph for 3 days, then 10mg  Joseph for 3 days, then stop, Disp: 30 tablet, Rfl: 0   binimetinib  (MEKTOVI ) 15 MG tablet, Take 3 tablets (45 mg total) by mouth 2 (two) times Joseph., Disp: 180 tablet, Rfl: 2   encorafenib  (BRAFTOVI ) 75 MG capsule, Take 6 capsules (450 mg total) by mouth Joseph., Disp: 180 capsule, Rfl: 2   Fluticasone-Umeclidin-Vilant (TRELEGY ELLIPTA ) 100-62.5-25 MCG/ACT AEPB, Inhale 1 puff into the lungs Joseph at 2 PM., Disp: , Rfl:    prochlorperazine  (COMPAZINE ) 10 MG tablet, Take 1 tablet (10 mg total) by mouth every 6 (six) hours as needed. (Patient not taking: Reported on 12/26/2023), Disp: 60 tablet, Rfl: 2:  :   Allergies  Allergen Reactions   Bee Venom Anaphylaxis    syncope  :  Family history: Sister had renal cell cancer, brother-prostate cancer, brother-pancreas cancer, maternal uncle-lung cancer, paternal aunt-"cancer "  SOCIAL HISTORY: He lives with his wife in [place].  He quit smoking cigarettes 10 years ago.  He reports rare alcohol use.  He is retired after working in [employer].  Review of Systems:  Positives include: Dyspnea, cough, "fuzzy "vision in the left eye  A complete ROS was otherwise negative.   Physical Exam:  Blood pressure (!) 157/100,  pulse (!) 114, temperature 98.1 F (36.7 C), resp. rate 20, height 6\' 1"  (1.854 m), weight 261 lb (118.4 kg), SpO2 94%.  HEENT: No thrush Lungs: Distant breath sounds, no respiratory distress Cardiac: Regular rhythm with premature beats Abdomen: No hepatosplenomegaly  Vascular: No leg edema Lymph nodes: Pea-sized right scalene node, no other cervical, supraclavicular, or axillary nodes Neurologic: Alert and oriented, the motor exam appears intact in the upper and lower extremities bilaterally Musculoskeletal: No spine tenderness  LABS:   Recent Labs    12/26/23 1026  WBC 16.1*  HGB 15.4  HCT 47.3  PLT 192     Recent Labs    12/26/23 1026  NA 139  K 3.4*  CL 102  CO2 25  GLUCOSE 89  BUN 19  CREATININE 0.75  CALCIUM 9.7      RADIOLOGY:  CT Angio Chest Pulmonary Embolism (PE) W or WO Contrast Result Date: 12/26/2023 CLINICAL DATA:  Pulmonary embolism (PE)  suspected, high prob EXAM: CT ANGIOGRAPHY CHEST WITH CONTRAST TECHNIQUE: Multidetector CT imaging of the chest was performed using the standard protocol during bolus administration of intravenous contrast. Multiplanar CT image reconstructions and MIPs were obtained to evaluate the vascular anatomy. RADIATION DOSE REDUCTION: This exam was performed according to the departmental dose-optimization program which includes automated exposure control, adjustment of the mA and/or kV according to patient size and/or use of iterative reconstruction technique. CONTRAST:  75mL OMNIPAQUE IOHEXOL 350 MG/ML SOLN COMPARISON:  Dec 26, 2023, November 17, 2023 FINDINGS: Pulmonary Embolism: No pulmonary embolism. Cardiovascular: No cardiomegaly. Moderate volume pericardial effusion. No aortic aneurysm. Mediastinum/Nodes: Enlarged left supraclavicular lymph node measuring 2.5 x 3.7 cm (axial 7). Multiple mildly enlarged right supraclavicular lymph nodes also present measuring up to 1.1 cm (axial 5). Significant enlargement of conglomerate lymph nodes  along the left mediastinum/AP window, measuring 5.6 x 3.6 cm (axial 51), previously measuring 3.8 x 2.1 cm. Upper left internal mammary lymph nodes have also increased in size in the interim measuring 4.3 by 4 cm (previously 2.2 x 2.5 cm). Increased fullness of the inferior left internal mammary lymph nodes also present. Significant progression of the bilateral hilar lymphadenopathy. For example, anterior right hilar lymph node measures 2.3 cm in short axis dimension (previously 1.7 cm). Lungs/Pleura: The trachea is midline and patent. Diffuse bronchial wall thickening. Patchy perihilar ground-glass airspace opacities in the right upper and right lower lobes and the left upper lobe, lingula, and left lower lobe. Similar size of the spiculated mass in the perihilar right upper lobe, measuring 2.7 x 2.9 cm. Worsening consolidation of the right middle lobe. Overall, similar size and number of scattered pulmonary metastases throughout both lungs. For example, the largest nodule in the superior segment of the right lower lobe measures 1.6 cm (previously 1.5 cm). No pneumothorax. Small right and small to moderate left pleural effusions. Musculoskeletal: Bony cortical destruction in the anterolateral right first rib (axial 18). Bony cortical destruction of the posterolateral left sixth rib with overlying periosteal reaction. Enlarging destructive lesion in the inferior sternum. Upper Abdomen: 1.1 cm nodule in the right adrenal gland. Review of the MIP images confirms the above findings. IMPRESSION: 1. No pulmonary embolism. Diffuse bronchial wall thickening with perihilar ground-glass airspace opacities in both lungs. This could represent changes of developing multifocal pneumonia versus early pulmonary edema. Small right and small to moderate left pleural effusions. 2. Worsening metastatic lymphadenopathy in the lower neck and chest, as delineated above. 3. Similar size and number of scattered bilateral pulmonary  metastases, the largest in the right lower lobe measures 1.6 cm (previously 1.5 cm). 4. Progressive bony metastatic disease, now involving the right anterolateral first rib. increased size of the left posterolateral sixth rib and inferior sternal body lesions. 5. Interval development of a small nodule in the right adrenal gland, measuring 1.1 cm, worrisome for new metastatic disease. Electronically Signed   By: Rance Burrows M.D.   On: 12/26/2023 14:18   DG Chest 2 View Result Date: 12/26/2023 CLINICAL DATA:  Shortness of breath.  Known malignancy EXAM: CHEST - 2 VIEW COMPARISON:  X-ray 12/15/2023 FINDINGS: Stable right IJ chest port. Tip along the central SVC. Stable cardiopericardial silhouette with tortuous aorta ectatic aorta. Underinflated x-rays which are under penetrated. Bronchovascular crowding. The bandlike opacity right perihilar has improved from previous. Slightly more opacity at the medial right lung base however. No pneumothorax or effusion. Overlapping cardiac leads. IMPRESSION: Changing opacity. Improving right mid lung perihilar. Increasing right lung base. Recommend  continued follow-up. Decreasing left lung base opacity. Electronically Signed   By: Adrianna Horde M.D.   On: 12/26/2023 12:29   ECHOCARDIOGRAM COMPLETE Result Date: 12/26/2023    ECHOCARDIOGRAM REPORT   Patient Name:   Jose Joseph Date of Exam: 12/26/2023 Medical Rec #:  [id number]     Height:       73.0 in Accession #:    [id number]    Weight:       266.0 lb Date of Birth:  05-11-1956     BSA:          2.429 m Patient Age:    67 years      BP:           134/77 mmHg Patient Gender: M             HR:           114 bpm. Exam Location:  Inpatient Procedure: 2D Echo, Cardiac Doppler and Color Doppler (Both Spectral and Color            Flow Doppler were utilized during procedure). Indications:    Chemo Z09  History:        Patient has no prior history of Echocardiogram examinations.  Sonographer:    Astrid Blamer Referring Phys:  [phone] Paulett Boros IMPRESSIONS  1. Left ventricular ejection fraction, by estimation, is 55 to 60%. The left ventricle has normal function. The left ventricle has no regional wall motion abnormalities. Left ventricular diastolic parameters are consistent with Grade I diastolic dysfunction (impaired relaxation).  2. Right ventricular systolic function is normal. The right ventricular size is normal.  3. Large pericardial effusion. The pericardial effusion is circumferential. There is no evidence of cardiac tamponade.  4. The mitral valve is normal in structure. Trivial mitral valve regurgitation. No evidence of mitral stenosis.  5. The aortic valve is tricuspid. There is mild calcification of the aortic valve. Aortic valve regurgitation is not visualized. Aortic valve sclerosis/calcification is present, without any evidence of aortic stenosis.  6. The inferior vena cava is dilated in size with >50% respiratory variability, suggesting right atrial pressure of 8 mmHg. Conclusion(s)/Recommendation(s): There is a moderate to large circumfrential pericardial effusion without over tamponade. Will contact patient for cardiology follow-up. FINDINGS  Left Ventricle: Left ventricular ejection fraction, by estimation, is 55 to 60%. The left ventricle has normal function. The left ventricle has no regional wall motion abnormalities. The left ventricular internal cavity size was normal in size. There is  no left ventricular hypertrophy. Left ventricular diastolic parameters are consistent with Grade I diastolic dysfunction (impaired relaxation). Right Ventricle: The right ventricular size is normal. No increase in right ventricular wall thickness. Right ventricular systolic function is normal. Left Atrium: Left atrial size was normal in size. Right Atrium: Right atrial size was normal in size. Pericardium: A large pericardial effusion is present. The pericardial effusion is circumferential. There is no evidence of cardiac  tamponade. Mitral Valve: The mitral valve is normal in structure. Trivial mitral valve regurgitation. No evidence of mitral valve stenosis. Tricuspid Valve: The tricuspid valve is normal in structure. Tricuspid valve regurgitation is not demonstrated. No evidence of tricuspid stenosis. Aortic Valve: The aortic valve is tricuspid. There is mild calcification of the aortic valve. Aortic valve regurgitation is not visualized. Aortic valve sclerosis/calcification is present, without any evidence of aortic stenosis. Aortic valve mean gradient measures 3.0 mmHg. Aortic valve peak gradient measures 4.7 mmHg. Aortic valve area, by VTI measures 2.53 cm. Pulmonic Valve:  The pulmonic valve was not well visualized. Pulmonic valve regurgitation is not visualized. No evidence of pulmonic stenosis. Aorta: The aortic root is normal in size and structure. Venous: The inferior vena cava is dilated in size with greater than 50% respiratory variability, suggesting right atrial pressure of 8 mmHg. IAS/Shunts: No atrial level shunt detected by color flow Doppler.  LEFT VENTRICLE PLAX 2D LVIDd:         3.70 cm LVIDs:         2.70 cm LV PW:         1.00 cm LV IVS:        0.80 cm LVOT diam:     2.00 cm LV SV:         44 LV SV Index:   18 LVOT Area:     3.14 cm  RIGHT VENTRICLE RV S prime:     19.10 cm/s TAPSE (M-mode): 2.4 cm LEFT ATRIUM             Index        RIGHT ATRIUM           Index LA Vol (A2C):   31.7 ml 13.05 ml/m  RA Area:     17.00 cm LA Vol (A4C):   26.3 ml 10.83 ml/m  RA Volume:   49.70 ml  20.46 ml/m LA Biplane Vol: 28.9 ml 11.90 ml/m  AORTIC VALVE AV Area (Vmax):    2.45 cm AV Area (Vmean):   2.41 cm AV Area (VTI):     2.53 cm AV Vmax:           108.00 cm/s AV Vmean:          80.800 cm/s AV VTI:            0.175 m AV Peak Grad:      4.7 mmHg AV Mean Grad:      3.0 mmHg LVOT Vmax:         84.20 cm/s LVOT Vmean:        62.100 cm/s LVOT VTI:          0.141 m LVOT/AV VTI ratio: 0.81  AORTA Ao Root diam: 3.20 cm   SHUNTS Systemic VTI:  0.14 m Systemic Diam: 2.00 cm Jules Oar MD Electronically signed by Jules Oar MD Signature Date/Time: 12/26/2023/10:24:16 AM    Final    DG Chest 2 View Result Date: 12/15/2023 CLINICAL DATA:  Shortness of breath EXAM: CHEST - 2 VIEW COMPARISON:  Chest radiograph November 21, 2023 FINDINGS: Port-A-Cath tip projects over the superior vena cava. Stable cardiac and mediastinal contours. Redemonstrated bandlike consolidation right mid lung. Interval development of consolidative opacities left lung base. Possible small left pleural effusion. Thoracic spine degenerative changes. No pneumothorax. IMPRESSION: New consolidative opacity left lower lung may represent combination of pleural fluid and underlying atelectasis. Underlying infection not excluded. Bandlike consolidation right mid lung may represent atelectasis and/or associated malignant process. Electronically Signed   By: Jone Neither M.D.   On: 12/15/2023 12:30   IR IMAGING GUIDED PORT INSERTION Result Date: 12/14/2023 INDICATION: 68 year old male referred for port catheter EXAM: IMAGE GUIDED PORT CATHETER MEDICATIONS: None ANESTHESIA/SEDATION: Moderate (conscious) sedation was employed during this procedure. A total of Versed  1.0 mg and Fentanyl  50 mcg was administered intravenously. Moderate Sedation Time: 16 minutes. The patient's level of consciousness and vital signs were monitored continuously by radiology nursing throughout the procedure under my direct supervision. FLUOROSCOPY TIME:  Fluoroscopy Time:   (31 mGy). COMPLICATIONS: None PROCEDURE:  Informed written consent was obtained from the patient after a discussion of the risks, benefits, and alternatives to treatment. Questions regarding the procedure were encouraged and answered. The right neck and chest were prepped with chlorhexidine  in a sterile fashion, and a sterile drape was applied covering the operative field. Maximum barrier sterile technique with sterile gowns  and gloves were used for the procedure. A timeout was performed prior to the initiation of the procedure. Ultrasound survey was performed with images stored and sent to PACs. Right IJ vein documented to be patent. The right neck and chest was prepped with chlorhexidine , and draped in the usual sterile fashion using maximum barrier technique (cap and mask, sterile gown, sterile gloves, large sterile sheet, hand hygiene and cutaneous antiseptic). Local anesthesia was attained by infiltration with 1% lidocaine  without epinephrine . Ultrasound demonstrated patency of the right internal jugular vein, and this was documented with an image. Under real-time ultrasound guidance, this vein was accessed with a 21 gauge micropuncture needle and image documentation was performed. A small dermatotomy was made at the access site with an 11 scalpel. A 0.018" wire was advanced into the SVC and used to estimate the length of the internal catheter. The access needle exchanged for a 71F micropuncture vascular sheath. The 0.018" wire was then removed and a 0.035" wire advanced into the IVC. An appropriate location for the subcutaneous reservoir was selected below the clavicle and an incision was made through the skin and underlying soft tissues. The subcutaneous tissues were then dissected using a combination of blunt and sharp surgical technique and a pocket was formed. A single lumen power injectable portacatheter was then tunneled through the subcutaneous tissues from the pocket to the dermatotomy and the port reservoir placed within the subcutaneous pocket. The venous access site was then serially dilated and a peel away vascular sheath placed over the wire. The wire was removed and the port catheter advanced into position under fluoroscopic guidance. The catheter tip is positioned in the cavoatrial junction. This was documented with a spot image. The portacatheter was then tested and found to flush and aspirate well. The port was  flushed with saline followed by 100 units/mL heparinized saline. The pocket was then closed in two layers using first subdermal inverted interrupted absorbable sutures followed by a running subcuticular suture. The epidermis was then sealed with Dermabond. The dermatotomy at the venous access site was also seal with Dermabond. Patient tolerated the procedure well and remained hemodynamically stable throughout. No complications encountered and no significant blood loss encountered IMPRESSION: Status post right IJ port catheter. Signed, Marciano Settles. Rexine Cater, RPVI [facility] Radiology Electronically Signed   By: Myrlene Asper D.O.   On: 12/14/2023 12:48   MR Brain W Wo Contrast Result Date: 12/06/2023 CLINICAL DATA:  Provided history: Malignant neoplasm of unspecified part of unspecified bronchus or lung. Non-small cell lung cancer, staging. EXAM: MRI HEAD WITHOUT AND WITH CONTRAST TECHNIQUE: Multiplanar, multiecho pulse sequences of the brain and surrounding structures were obtained without and with intravenous contrast. CONTRAST:  10mL GADAVIST  GADOBUTROL  1 MMOL/ML IV SOLN COMPARISON:  PET CT 10/19/2023. FINDINGS: Brain: Mild generalized cerebral atrophy. Punctate focus of restricted diffusion within the anterior right frontal lobe without appreciable corresponding enhancement (series 6, image 28). Multiple small foci of diffusion-weighted signal abnormality within the medial right frontoparietal cortex measuring up to 5 mm (for instance as seen on series 6, image 28). No corresponding enhancement appreciated. Punctate focus of diffusion-weighted signal abnormality  within the left parietal lobe white matter without appreciable corresponding enhancement (series 6, image 33). Punctate focus of diffusion-weighted signal abnormality within the right cerebellar hemisphere without appreciable corresponding enhancement (series 6, image 13). Background multifocal T2  FLAIR hyperintense signal abnormality within the cerebral white matter (moderate) and pons (mild). Findings are nonspecific, but most often secondary to chronic small vessel ischemia. No chronic intracranial blood products. No extra-axial fluid collection. No midline shift. Vascular: Maintained flow voids within the proximal large arterial vessels. Skull and upper cervical spine: No focal worrisome marrow lesion. Sinuses/Orbits: No mass or acute finding within the imaged orbits. 10 mm mucous retention cyst within the left maxillary sinus. Impression #1 will be called to the ordering clinician or representative by the Radiologist Assistant, and communication documented in the PACS or [employer]. IMPRESSION: 1. Small foci of diffusion-weighted signal abnormality within the anterior right frontal lobe, right parietooccipital lobes, left parietal lobe and right cerebellar hemisphere (measuring up to 5 mm). There is no appreciable corresponding pathologic enhancement at these sites, and these likely reflect acute/subacute infarcts (involvement of multiple vascular territories concerning for an embolic process). However, a short-interval follow-up brain MRI is recommended in 6 weeks to ensure expected evolution and to exclude poorly enhancing metastases. 2. Background T2 FLAIR hyperintense signal changes within the cerebral white matter (moderate) and pons (mild), nonspecific but most often secondary to chronic small vessel ischemia. 3. 10 mm left maxillary sinus mucous retention cyst. Electronically Signed   By: Bascom Lily D.O.   On: 12/06/2023 13:04    Assessment and Plan:   Stage IV non-small cell lung cancer 10/19/2023 PET-bilateral lung nodules, dominant right upper lobe spiculated lesion, by lateral hilar and mediastinal lymphadenopathy, right supraclavicular lymphadenopathy, hypermetabolic bone lesions at the sternum, left sixth rib, and right iliac Bronchoscopy/biopsy 11/21/2023-right upper lobe nodule  and level 4R/11R lymph nodes-adenocarcinoma, BRAF V600 E, PD-L1 TPS 95% 12/06/2023: MRI brain-small foci of diffusion signal abnormality without corresponding enhancement, likely acute/subacute infarcts Scheduled to begin encorafenib  and binimetinib  01/01/2024 Palliative radiation to the lung, sternum, and ribs beginning 12/15/2023 CT chest 12/26/2023: Enlarged left supraclavicular node, significant enlargement of lymph nodes at the left mediastinum/AP window, left internal mammary nodes and bilateral hilar nodes, groundglass airspace disease in the bilateral lungs, worsening right middle lobe consolidation, progressive bone metastases, new right adrenal nodule, small right and small to moderate left pleural effusion, moderate pericardial effusion  2.  Circumferential pericardial effusion on echocardiogram 12/26/2023 3.  Cough/dyspnea secondary to #1-probable components of effusions and airspace disease  Jose Joseph has stage IV non-small cell lung cancer.  He has developed progressive dyspnea over the past month.  He was found to have a large pericardial effusion on echocardiogram today.  A CT chest is negative for pulmonary embolism, but it reveals progression of lung cancer involving chest lymph nodes in the bones.  He has right middle lobe consolidation and bilateral airspace disease.   It is unclear whether the dyspnea is related to the pericardial effusion or parenchymal lung disease.  The pleural effusions appear small.  Recommendations:  Consider diagnostic/therapeutic pericardiocentesis Antibiotics for probable postobstructive pneumonia Initiate systemic therapy with encorafenib  and binimetinib  as soon as possible (consider discontinuing radiation) Oncology will continue following in the hospital and outpatient follow-up to be scheduled with Dr. Cheree Cords  I discussed the prognosis and treatment options with Jose Joseph.  We discussed CODE STATUS.  He would like to proceed with systemic treatment  of the cancer and requests a full CODE  STATUS.   Coni Deep, MD 12/26/2023, 5:45 PM

## 2023-12-26 NOTE — ED Triage Notes (Addendum)
 Pt to er, pt states that he just came from the echo doc and was told to come to the er because he has fluid around his heart.  Pt talking in full sentences.

## 2023-12-26 NOTE — H&P (Addendum)
 History and Physical  Patient: Jose Joseph WUJ:[id number] DOB: 1956/02/01 DOA: 12/26/2023 DOS: the patient was seen and examined on 12/26/2023 Patient coming from: Home  Chief Complaint:  Chief Complaint  Patient presents with   Shortness of Breath   HPI: Jose Joseph is a 68 y.o. male with PMH significant of stage IV lung NSCLC, diagnosed in January of this year has been following oncology in [place], Dr. Cheree Cords, underwent biopsy on 11/21/2023 which showed adenocarcinoma, metastatic to sternum and rib.  Patient is receiving radiation therapy (Dr. Jeryl Moris), had a port placed on 12/14/2023 and due to start chemo on 01/01/2024.  Due to new chemotherapy and hypertension noticed during port placement, echocardiogram was ordered by his oncologist, which showed a large pericardial effusion without tamponade.  Patient has progressively worsening shortness of breath, chest tightness, dyspnea on exertion for last 2 weeks.  No lower extremity edema, sitting upright.  ED course:  In ED, temp 97.7 F, RR 20-28, heart rate 109-117, BP 167/109, O2 sats 97% 2D echo showed EF of 55 to 60%, grade 1 diastolic dysfunction, normal right ventricular systolic function, large pericardial effusion, circumferential, no evidence of tamponade Cardiology was urgently consulted by EDP.  Patient was seen by Dr. Charle Congo, [facility].  CTA chest was done which showed no acute pulmonary embolism, perihilar groundglass airspace opacities in both lungs could represent changes of developing multifocal pneumonia versus early pulmonary edema.  Small right and small to moderate left pleural effusions.  Worsening metastatic lymphadenopathy in the lower neck and chest.  Scattered bilateral pulmonary metastasis, largest in the right lower lobe.  Progressive bony metastatic disease involving the right anterolateral first rib, sixth rib and inferior sternal body lesions.  Metastatic small  nodule in the right adrenal gland measuring 1.1 cm  Review of Systems: As mentioned in the history of present illness. All other systems reviewed and are negative. Past Medical History:  Diagnosis Date   Arthritis    Asthma    Complication of anesthesia    after his neck surgery pt. trying to get up and walk out   Depression    Dyspnea    when he was smoking   History of kidney stones    2021   Pneumonia    during COVID in 2021   Past Surgical History:  Procedure Laterality Date   BRONCHIAL BIOPSY  11/21/2023   Procedure: BRONCHOSCOPY, WITH BIOPSY;  Surgeon: Denson Flake, MD;  Location[facility] ENDOSCOPY;  Service: Pulmonary;;   BRONCHIAL NEEDLE ASPIRATION BIOPSY  11/21/2023   Procedure: BRONCHOSCOPY, WITH NEEDLE ASPIRATION BIOPSY;  Surgeon: Denson Flake, MD;  Location: [facility] ENDOSCOPY;  Service: Pulmonary;;   COLONOSCOPY WITH PROPOFOL  N/A 01/19/2023   Procedure: COLONOSCOPY WITH PROPOFOL ;  Surgeon: Suzette Espy, MD;  Location: [facility] ENDO SUITE;  Service: Endoscopy;  Laterality: N/A;  200pm, asa 2   ENDOBRONCHIAL ULTRASOUND Bilateral 11/21/2023   Procedure: ENDOBRONCHIAL ULTRASOUND (EBUS);  Surgeon: Denson Flake, MD;  Location: [facility] ENDOSCOPY;  Service: Pulmonary;  Laterality: Bilateral;   HERNIA REPAIR     IR IMAGING GUIDED PORT INSERTION  12/14/2023   KNEE ARTHROCENTESIS     NECK SURGERY     POLYPECTOMY  01/19/2023   Procedure: POLYPECTOMY;  Surgeon: Suzette Espy, MD;  Location: [facility] ENDO SUITE;  Service: Endoscopy;;   TOTAL HIP ARTHROPLASTY Left 01/15/2021   Procedure: LEFT TOTAL HIP ARTHROPLASTY ANTERIOR APPROACH;  Surgeon: Adah Acron, MD;  Location: [facility] OR;  Service: Orthopedics;  Laterality: Left;   VIDEO BRONCHOSCOPY WITH ENDOBRONCHIAL NAVIGATION Bilateral 11/21/2023   Procedure: VIDEO BRONCHOSCOPY WITH ENDOBRONCHIAL NAVIGATION;  Surgeon: Denson Flake, MD;  Location: [facility] ENDOSCOPY;  Service: Pulmonary;  Laterality: Bilateral;   WRIST ARTHROPLASTY     Social History:   reports that he quit smoking about 11 years ago. His smoking use included cigarettes. He started smoking about 46 years ago. He has a 35 pack-year smoking history. He has been exposed to tobacco smoke. He has never used smokeless tobacco. He reports that he does not currently use alcohol after a past usage of about 2.0 standard drinks of alcohol per week. He reports current drug use. Drug: Marijuana. Allergies  Allergen Reactions   Bee Venom Anaphylaxis    syncope   Family History  Problem Relation Age of Onset   Kidney cancer Sister    Prostate cancer Brother    Prior to Admission medications   Medication Sig Start Date End Date Taking? Authorizing Provider  albuterol  (VENTOLIN  HFA) 108 (90 Base) MCG/ACT inhaler Inhale 2 puffs into the lungs every 6 (six) hours as needed for wheezing or shortness of breath. 10/16/23   Raejean Bullock, NP  amoxicillin -clavulanate (AUGMENTIN ) 875-125 MG tablet Take 1 tablet by mouth 2 (two) times daily. 12/21/23   Paulett Boros, MD  binimetinib  (MEKTOVI ) 15 MG tablet Take 3 tablets (45 mg total) by mouth 2 (two) times daily. 12/21/23   Paulett Boros, MD  budeson-glycopyrrolate-formoterol (BREZTRI  AEROSPHERE) 160-9-4.8 MCG/ACT AERO inhaler Inhale 2 puffs into the lungs in the morning and at bedtime. 12/20/23   Raejean Bullock, NP  cetirizine (ZYRTEC) 10 MG tablet Take 10 mg by mouth in the morning.    [provider]  citalopram  (CELEXA ) 20 MG tablet Take 20 mg by mouth in the morning.    [provider]  encorafenib  (BRAFTOVI ) 75 MG capsule Take 6 capsules (450 mg total) by mouth daily. 12/21/23   Katragadda, Sreedhar, MD  fexofenadine (ALLEGRA) 180 MG tablet Take 180 mg by mouth in the morning.    [provider]  Fluticasone-Umeclidin-Vilant (TRELEGY ELLIPTA ) 100-62.5-25 MCG/ACT AEPB Inhale 1 puff into the lungs daily. 11/10/23   Raejean Bullock, NP  Fluticasone-Umeclidin-Vilant (TRELEGY ELLIPTA ) 100-62.5-25 MCG/ACT AEPB Inhale 1  puff into the lungs daily at 2 PM. 11/10/23   Raejean Bullock, NP  predniSONE  (DELTASONE ) 10 MG tablet Take 40mg  daily for 3 days, then 30mg  daily for 3 days, then 20mg  daily for 3 days, then 10mg  daily for 3 days, then stop 12/15/23   Byrum, Robert S, MD  prochlorperazine  (COMPAZINE ) 10 MG tablet Take 1 tablet (10 mg total) by mouth every 6 (six) hours as needed. 12/21/23   Paulett Boros, MD   Physical Exam: Vitals:   12/26/23 1020 12/26/23 1021 12/26/23 1200  BP:  (!) 167/109 (!) 157/100  Pulse:  (!) 109 (!) 114  Resp:  20 20  Temp:  97.7 F (36.5 C)   TempSrc:  Oral   SpO2:  94% 94%  Weight: 118.4 kg    Height: 6\' 1"  (1.854 m)       General: Alert, awake, oriented x3, NAD, uncomfortable due to dyspnea Eyes: pink conjunctiva, anicteric sclera, PERLA HEENT: normocephalic, atraumatic, oropharynx clear Neck: supple, no masses or lymphadenopathy,  JVD+ CVS: Regular rate and rhythm, tachycardia Resp : Decreased breath sounds bilaterally, no wheezing GI : Soft, nontender, nondistended, positive bowel sounds. No hepatomegaly.  Ext: trace lower extremity edema  Musculoskeletal: No  clubbing or cyanosis, positive pedal pulses. No contracture. ROM intact  Neuro: Grossly intact, no focal neurological deficits, strength 5/5 upper and lower extremities bilaterally Psych: alert and oriented x 3, normal mood and affect Skin: no rashes or lesions, warm and dry   Data Reviewed: I have reviewed ED notes, Vitals, Lab results and outpatient records.   Recent Labs  Lab 12/26/23 1026  NA 139  K 3.4*  CL 102  CO2 25  GLUCOSE 89  BUN 19  CREATININE 0.75  CALCIUM 9.7   Recent Labs  Lab 12/26/23 1026  WBC 16.1*  HGB 15.4  HCT 47.3  MCV 93.1  PLT 192    Assessment and Plan Principal Problem:   Pericardial effusion, dyspnea on exertion ?  Multifocal pneumonia vs pulmonary edema -Elective 2D echo done for initiation of new chemotherapy and hypertension showed preserved LVEF,  moderate to large pericardial effusion, no tamponade.  Patient however appears to be symptomatic with shortness of breath, DOE, sitting upright, chest tightness. - CTA chest showed no pulmonary embolism, perihilar groundglass opacities suggestive of pulmonary edema versus developing multifocal pneumonia, right and left pleural effusions. - Cardiology has been consulted, recommended admission to [facility] -Cardiothoracic surgery has been consulted for pericardial window, likely in a.m.  - N.p.o. after midnight.  Defer management to cardiology/CT surgery. - Obtain procalcitonin, blood cultures. He has leukocytosis however he was recently treated for COPD exacerbation with prednisone , inhalers and Zithromax  (by Dr. Baldwin Levee), will empirically place on IV Rocephin and Zithromax .  - Diuretics per cardiology     Active Problems:   Malignant neoplasm of upper lobe of right lung (HCC), stage IV metastatic to ribs and sternum - Patient currently receiving XRT with Dr. Jeryl Moris who was notified about patient's admission - Pain control, oncology notified, discussed with Dr. Scherrie Curt, on-call   Hypertensive urgency -Will place on labetalol IV as needed with parameters     Chronic obstructive pulmonary disease (HCC)  -Currently no acute wheezing,  - Placed on Xopenex nebs as needed, continue Trelegy - Placed on IV Zithromax , Rocephin  Advance Care Planning:   Code Status: Full Code discussed with the patient Consults: Cardiology, CT surgery Family Communication: Patient's son and the daughter-in-law at the bedside Severity of Illness:      The appropriate patient status for this patient is INPATIENT. Inpatient status is judged to be reasonable and necessary in order to provide the required intensity of service to ensure the patient's safety. The patient's presenting symptoms, physical exam findings, and initial radiographic and laboratory data in the context of their chronic comorbidities is felt to place  them at high risk for further clinical deterioration. Furthermore, it is not anticipated that the patient will be medically stable for discharge from the hospital within 2 midnights of admission.   * I certify that at the point of admission it is my clinical judgment that the patient will require inpatient hospital care spanning beyond 2 midnights from the point of admission due to high intensity of service, high risk for further deterioration and high frequency of surveillance required.*    Author: Bertram Brocks, MD 12/26/2023 3:04 PM For on call review [email or web address].

## 2023-12-26 NOTE — ED Provider Notes (Signed)
 [facility] EMERGENCY DEPARTMENT AT [facility] Provider Note   CSN: [id number] Arrival date & time: 12/26/23  1014     History  Chief Complaint  Patient presents with   Shortness of Breath    Jose Joseph is a 68 y.o. male.  HPI 68 yo male ho lung cancer, diagnosed this year on radiation, and to start chemo.  Seen to have port placed and was hypertensive.  Echo ordered and large cardiac effusion noted. Echo ordered by oncology and Dr. Dasie Epps called to see patient.      Home Medications Prior to Admission medications   Medication Sig Start Date End Date Taking? Authorizing Provider  albuterol  (VENTOLIN  HFA) 108 (90 Base) MCG/ACT inhaler Inhale 2 puffs into the lungs every 6 (six) hours as needed for wheezing or shortness of breath. 10/16/23   Raejean Bullock, NP  amoxicillin -clavulanate (AUGMENTIN ) 875-125 MG tablet Take 1 tablet by mouth 2 (two) times daily. 12/21/23   Paulett Boros, MD  binimetinib  (MEKTOVI ) 15 MG tablet Take 3 tablets (45 mg total) by mouth 2 (two) times daily. 12/21/23   Paulett Boros, MD  budeson-glycopyrrolate-formoterol (BREZTRI  AEROSPHERE) 160-9-4.8 MCG/ACT AERO inhaler Inhale 2 puffs into the lungs in the morning and at bedtime. 12/20/23   Raejean Bullock, NP  cetirizine (ZYRTEC) 10 MG tablet Take 10 mg by mouth in the morning.    [provider]  citalopram  (CELEXA ) 20 MG tablet Take 20 mg by mouth in the morning.    [provider]  encorafenib  (BRAFTOVI ) 75 MG capsule Take 6 capsules (450 mg total) by mouth daily. 12/21/23   Katragadda, Sreedhar, MD  fexofenadine (ALLEGRA) 180 MG tablet Take 180 mg by mouth in the morning.    [provider]  Fluticasone-Umeclidin-Vilant (TRELEGY ELLIPTA ) 100-62.5-25 MCG/ACT AEPB Inhale 1 puff into the lungs daily. 11/10/23   Raejean Bullock, NP  Fluticasone-Umeclidin-Vilant (TRELEGY ELLIPTA ) 100-62.5-25 MCG/ACT AEPB Inhale 1 puff into the lungs daily at 2 PM. 11/10/23   Raejean Bullock, NP  predniSONE  (DELTASONE ) 10 MG tablet Take 40mg  daily for 3 days, then 30mg  daily for 3 days, then 20mg  daily for 3 days, then 10mg  daily for 3 days, then stop 12/15/23   Byrum, Robert S, MD  prochlorperazine  (COMPAZINE ) 10 MG tablet Take 1 tablet (10 mg total) by mouth every 6 (six) hours as needed. 12/21/23   Paulett Boros, MD      Allergies    Bee venom    Review of Systems   Review of Systems  Physical Exam Updated Vital Signs BP (!) 157/100   Pulse (!) 114   Temp 97.7 F (36.5 C) (Oral)   Resp 20   Ht 1.854 m (6\' 1" )   Wt 118.4 kg   SpO2 94%   BMI 34.43 kg/m  Physical Exam Vitals reviewed.  HENT:     Head: Normocephalic.     Mouth/Throat:     Mouth: Mucous membranes are moist.  Eyes:     Pupils: Pupils are equal, round, and reactive to light.  Cardiovascular:     Rate and Rhythm: Regular rhythm. Tachycardia present.  Pulmonary:     Effort: Tachypnea present.     Breath sounds: Normal breath sounds.  Musculoskeletal:        General: Normal range of motion.     Cervical back: Normal range of motion.  Skin:    General: Skin is warm and dry.     Capillary Refill: Capillary refill takes  less than 2 seconds.  Neurological:     General: No focal deficit present.     Mental Status: He is alert.  Psychiatric:        Mood and Affect: Mood normal.     ED Results / Procedures / Treatments   Labs (all labs ordered are listed, but only abnormal results are displayed) Labs Reviewed  BASIC METABOLIC PANEL WITH GFR - Abnormal; Notable for the following components:      Result Value   Potassium 3.4 (*)    All other components within normal limits  CBC - Abnormal; Notable for the following components:   WBC 16.1 (*)    All other components within normal limits  RAPID URINE DRUG SCREEN, HOSP PERFORMED - Abnormal; Notable for the following components:   Tetrahydrocannabinol POSITIVE (*)    All other components within normal limits  TROPONIN I (HIGH  SENSITIVITY) - Abnormal; Notable for the following components:   Troponin I (High Sensitivity) 57 (*)    All other components within normal limits  TROPONIN I (HIGH SENSITIVITY) - Abnormal; Notable for the following components:   Troponin I (High Sensitivity) 57 (*)    All other components within normal limits    EKG EKG Interpretation Date/Time:  Tuesday Dec 26 2023 10:34:06 EDT Ventricular Rate:  116 PR Interval:  157 QRS Duration:  92 QT Interval:  313 QTC Calculation: 435 R Axis:   8  Text Interpretation: Sinus tachycardia Atrial premature complexes Low voltage, precordial leads Confirmed by Auston Blush [phone]) on 12/26/2023 12:40:29 PM  Radiology CT Angio Chest Pulmonary Embolism (PE) W or WO Contrast Result Date: 12/26/2023 CLINICAL DATA:  Pulmonary embolism (PE) suspected, high prob EXAM: CT ANGIOGRAPHY CHEST WITH CONTRAST TECHNIQUE: Multidetector CT imaging of the chest was performed using the standard protocol during bolus administration of intravenous contrast. Multiplanar CT image reconstructions and MIPs were obtained to evaluate the vascular anatomy. RADIATION DOSE REDUCTION: This exam was performed according to the departmental dose-optimization program which includes automated exposure control, adjustment of the mA and/or kV according to patient size and/or use of iterative reconstruction technique. CONTRAST:  75mL OMNIPAQUE IOHEXOL 350 MG/ML SOLN COMPARISON:  Dec 26, 2023, November 17, 2023 FINDINGS: Pulmonary Embolism: No pulmonary embolism. Cardiovascular: No cardiomegaly. Moderate volume pericardial effusion. No aortic aneurysm. Mediastinum/Nodes: Enlarged left supraclavicular lymph node measuring 2.5 x 3.7 cm (axial 7). Multiple mildly enlarged right supraclavicular lymph nodes also present measuring up to 1.1 cm (axial 5). Significant enlargement of conglomerate lymph nodes along the left mediastinum/AP window, measuring 5.6 x 3.6 cm (axial 51), previously measuring 3.8 x 2.1  cm. Upper left internal mammary lymph nodes have also increased in size in the interim measuring 4.3 by 4 cm (previously 2.2 x 2.5 cm). Increased fullness of the inferior left internal mammary lymph nodes also present. Significant progression of the bilateral hilar lymphadenopathy. For example, anterior right hilar lymph node measures 2.3 cm in short axis dimension (previously 1.7 cm). Lungs/Pleura: The trachea is midline and patent. Diffuse bronchial wall thickening. Patchy perihilar ground-glass airspace opacities in the right upper and right lower lobes and the left upper lobe, lingula, and left lower lobe. Similar size of the spiculated mass in the perihilar right upper lobe, measuring 2.7 x 2.9 cm. Worsening consolidation of the right middle lobe. Overall, similar size and number of scattered pulmonary metastases throughout both lungs. For example, the largest nodule in the superior segment of the right lower lobe measures 1.6 cm (previously 1.5 cm). No  pneumothorax. Small right and small to moderate left pleural effusions. Musculoskeletal: Bony cortical destruction in the anterolateral right first rib (axial 18). Bony cortical destruction of the posterolateral left sixth rib with overlying periosteal reaction. Enlarging destructive lesion in the inferior sternum. Upper Abdomen: 1.1 cm nodule in the right adrenal gland. Review of the MIP images confirms the above findings. IMPRESSION: 1. No pulmonary embolism. Diffuse bronchial wall thickening with perihilar ground-glass airspace opacities in both lungs. This could represent changes of developing multifocal pneumonia versus early pulmonary edema. Small right and small to moderate left pleural effusions. 2. Worsening metastatic lymphadenopathy in the lower neck and chest, as delineated above. 3. Similar size and number of scattered bilateral pulmonary metastases, the largest in the right lower lobe measures 1.6 cm (previously 1.5 cm). 4. Progressive bony  metastatic disease, now involving the right anterolateral first rib. increased size of the left posterolateral sixth rib and inferior sternal body lesions. 5. Interval development of a small nodule in the right adrenal gland, measuring 1.1 cm, worrisome for new metastatic disease. Electronically Signed   By: Rance Burrows M.D.   On: 12/26/2023 14:18   DG Chest 2 View Result Date: 12/26/2023 CLINICAL DATA:  Shortness of breath.  Known malignancy EXAM: CHEST - 2 VIEW COMPARISON:  X-Jeferson Boozer 12/15/2023 FINDINGS: Stable right IJ chest port. Tip along the central SVC. Stable cardiopericardial silhouette with tortuous aorta ectatic aorta. Underinflated x-rays which are under penetrated. Bronchovascular crowding. The bandlike opacity right perihilar has improved from previous. Slightly more opacity at the medial right lung base however. No pneumothorax or effusion. Overlapping cardiac leads. IMPRESSION: Changing opacity. Improving right mid lung perihilar. Increasing right lung base. Recommend continued follow-up. Decreasing left lung base opacity. Electronically Signed   By: Adrianna Horde M.D.   On: 12/26/2023 12:29   ECHOCARDIOGRAM COMPLETE Result Date: 12/26/2023    ECHOCARDIOGRAM REPORT   Patient Name:   Jose Joseph Date of Exam: 12/26/2023 Medical Rec #:  [id number]     Height:       73.0 in Accession #:    [id number]    Weight:       266.0 lb Date of Birth:  12/16/1955     BSA:          2.429 m Patient Age:    67 years      BP:           134/77 mmHg Patient Gender: M             HR:           114 bpm. Exam Location:  Inpatient Procedure: 2D Echo, Cardiac Doppler and Color Doppler (Both Spectral and Color            Flow Doppler were utilized during procedure). Indications:    Chemo Z09  History:        Patient has no prior history of Echocardiogram examinations.  Sonographer:    Astrid Blamer Referring Phys: [phone] Paulett Boros IMPRESSIONS  1. Left ventricular ejection fraction, by estimation, is 55 to 60%.  The left ventricle has normal function. The left ventricle has no regional wall motion abnormalities. Left ventricular diastolic parameters are consistent with Grade I diastolic dysfunction (impaired relaxation).  2. Right ventricular systolic function is normal. The right ventricular size is normal.  3. Large pericardial effusion. The pericardial effusion is circumferential. There is no evidence of cardiac tamponade.  4. The mitral valve is normal in structure. Trivial mitral valve regurgitation. No evidence  of mitral stenosis.  5. The aortic valve is tricuspid. There is mild calcification of the aortic valve. Aortic valve regurgitation is not visualized. Aortic valve sclerosis/calcification is present, without any evidence of aortic stenosis.  6. The inferior vena cava is dilated in size with >50% respiratory variability, suggesting right atrial pressure of 8 mmHg. Conclusion(s)/Recommendation(s): There is a moderate to large circumfrential pericardial effusion without over tamponade. Will contact patient for cardiology follow-up. FINDINGS  Left Ventricle: Left ventricular ejection fraction, by estimation, is 55 to 60%. The left ventricle has normal function. The left ventricle has no regional wall motion abnormalities. The left ventricular internal cavity size was normal in size. There is  no left ventricular hypertrophy. Left ventricular diastolic parameters are consistent with Grade I diastolic dysfunction (impaired relaxation). Right Ventricle: The right ventricular size is normal. No increase in right ventricular wall thickness. Right ventricular systolic function is normal. Left Atrium: Left atrial size was normal in size. Right Atrium: Right atrial size was normal in size. Pericardium: A large pericardial effusion is present. The pericardial effusion is circumferential. There is no evidence of cardiac tamponade. Mitral Valve: The mitral valve is normal in structure. Trivial mitral valve regurgitation. No  evidence of mitral valve stenosis. Tricuspid Valve: The tricuspid valve is normal in structure. Tricuspid valve regurgitation is not demonstrated. No evidence of tricuspid stenosis. Aortic Valve: The aortic valve is tricuspid. There is mild calcification of the aortic valve. Aortic valve regurgitation is not visualized. Aortic valve sclerosis/calcification is present, without any evidence of aortic stenosis. Aortic valve mean gradient measures 3.0 mmHg. Aortic valve peak gradient measures 4.7 mmHg. Aortic valve area, by VTI measures 2.53 cm. Pulmonic Valve: The pulmonic valve was not well visualized. Pulmonic valve regurgitation is not visualized. No evidence of pulmonic stenosis. Aorta: The aortic root is normal in size and structure. Venous: The inferior vena cava is dilated in size with greater than 50% respiratory variability, suggesting right atrial pressure of 8 mmHg. IAS/Shunts: No atrial level shunt detected by color flow Doppler.  LEFT VENTRICLE PLAX 2D LVIDd:         3.70 cm LVIDs:         2.70 cm LV PW:         1.00 cm LV IVS:        0.80 cm LVOT diam:     2.00 cm LV SV:         44 LV SV Index:   18 LVOT Area:     3.14 cm  RIGHT VENTRICLE RV S prime:     19.10 cm/s TAPSE (M-mode): 2.4 cm LEFT ATRIUM             Index        RIGHT ATRIUM           Index LA Vol (A2C):   31.7 ml 13.05 ml/m  RA Area:     17.00 cm LA Vol (A4C):   26.3 ml 10.83 ml/m  RA Volume:   49.70 ml  20.46 ml/m LA Biplane Vol: 28.9 ml 11.90 ml/m  AORTIC VALVE AV Area (Vmax):    2.45 cm AV Area (Vmean):   2.41 cm AV Area (VTI):     2.53 cm AV Vmax:           108.00 cm/s AV Vmean:          80.800 cm/s AV VTI:            0.175 m AV Peak Grad:  4.7 mmHg AV Mean Grad:      3.0 mmHg LVOT Vmax:         84.20 cm/s LVOT Vmean:        62.100 cm/s LVOT VTI:          0.141 m LVOT/AV VTI ratio: 0.81  AORTA Ao Root diam: 3.20 cm  SHUNTS Systemic VTI:  0.14 m Systemic Diam: 2.00 cm Jules Oar MD Electronically signed by Jules Oar MD Signature Date/Time: 12/26/2023/10:24:16 AM    Final     Procedures .Critical Care  Performed by: Auston Blush, MD Authorized by: Auston Blush, MD   Critical care provider statement:    Critical care time (minutes):  75   Critical care end time:  12/26/2023 2:50 PM   Critical care time was exclusive of:  Separately billable procedures and treating other patients and teaching time   Critical care was necessary to treat or prevent imminent or life-threatening deterioration of the following conditions:  Cardiac failure   Critical care was time spent personally by me on the following activities:  Development of treatment plan with patient or surrogate, discussions with consultants, evaluation of patient's response to treatment, examination of patient, ordering and review of laboratory studies, ordering and review of radiographic studies, ordering and performing treatments and interventions, pulse oximetry, re-evaluation of patient's condition and review of old charts     Medications Ordered in ED Medications  iohexol (OMNIPAQUE) 350 MG/ML injection 75 mL (75 mLs Intravenous Contrast Given 12/26/23 1251)    ED Course/ Medical Decision Making/ A&P                                 Medical Decision Making Amount and/or Complexity of Data Reviewed Labs: ordered. Radiology: ordered.  Risk Decision regarding hospitalization.  68 year old male with recent diagnosis of lung cancer, history of COPD, presents today with report of pericardial effusion and dyspnea.  Patient states he has been short of breath since he had biopsy of approximately an month ago.  Per report, patient went to have his port placed and was noted to be hypertensive.  He ended up having an outpatient echo and a large pericardial effusion was noted.  Cardiology was called and met him here in the ED.  Patient was seen by Dr. Sydney Eve. With patient's history of lung cancer, pericardial effusion, probable new infiltrate  noted on chest x-Artrice Kraker, history of COPD, will add CTA. Cardiology requests that he be admitted to hospitalist but transferred to [facility]. They will place consult note  1 dyspnea likely multifactorial with pericardial effusion without tamponade, lung cancer, possible new infiltrate on chest x-Aniesa Boback, and will assess for PE CTA negative for PE 2 leukocytosis 16,000 with support diagnosis of new infiltrate 3 troponin at 57  Care discussed with Dr. Albert Huff, cardiology Care discussed with Dr. Thelma Fire Dr. Albert Huff and cardiology team advise they are consulting Dr. Deloise Ferries, CT surgery for possible periardial window Dr. Albert Huff advises that oncology will need to be involved in care.  Dr. Thelma Fire added to lengthy chat regarding same.        Final Clinical Impression(s) / ED Diagnoses Final diagnoses:  Pericardial effusion  Malignant neoplasm of lung, unspecified laterality, unspecified part of lung [facility])    Rx / DC Orders ED Discharge Orders     None         Auston Blush, MD 12/26/23 1450

## 2023-12-27 ENCOUNTER — Encounter (HOSPITAL_COMMUNITY): Payer: Self-pay | Admitting: Internal Medicine

## 2023-12-27 ENCOUNTER — Inpatient Hospital Stay (HOSPITAL_COMMUNITY)

## 2023-12-27 ENCOUNTER — Ambulatory Visit

## 2023-12-27 ENCOUNTER — Encounter: Payer: Self-pay | Admitting: Radiation Oncology

## 2023-12-27 ENCOUNTER — Other Ambulatory Visit (HOSPITAL_COMMUNITY): Payer: Self-pay

## 2023-12-27 DIAGNOSIS — Z87891 Personal history of nicotine dependence: Secondary | ICD-10-CM

## 2023-12-27 DIAGNOSIS — Z8673 Personal history of transient ischemic attack (TIA), and cerebral infarction without residual deficits: Secondary | ICD-10-CM | POA: Diagnosis not present

## 2023-12-27 DIAGNOSIS — I160 Hypertensive urgency: Secondary | ICD-10-CM

## 2023-12-27 DIAGNOSIS — C3490 Malignant neoplasm of unspecified part of unspecified bronchus or lung: Secondary | ICD-10-CM

## 2023-12-27 DIAGNOSIS — I4891 Unspecified atrial fibrillation: Secondary | ICD-10-CM | POA: Insufficient documentation

## 2023-12-27 DIAGNOSIS — I3139 Other pericardial effusion (noninflammatory): Secondary | ICD-10-CM | POA: Diagnosis not present

## 2023-12-27 DIAGNOSIS — J449 Chronic obstructive pulmonary disease, unspecified: Secondary | ICD-10-CM

## 2023-12-27 LAB — BASIC METABOLIC PANEL WITH GFR
Anion gap: 10 (ref 5–15)
Anion gap: 10 (ref 5–15)
BUN: 21 mg/dL (ref 8–23)
BUN: 23 mg/dL (ref 8–23)
CO2: 26 mmol/L (ref 22–32)
CO2: 24 mmol/L (ref 22–32)
Calcium: 9.1 mg/dL (ref 8.9–10.3)
Calcium: 9 mg/dL (ref 8.9–10.3)
Chloride: 104 mmol/L (ref 98–111)
Chloride: 104 mmol/L (ref 98–111)
Creatinine, Ser: 1.08 mg/dL (ref 0.61–1.24)
Creatinine, Ser: 0.92 mg/dL (ref 0.61–1.24)
GFR, Estimated: >60 mL/min (ref >60)
GFR, Estimated: >60 mL/min (ref >60)
Glucose, Bld: 130 mg/dL — ABNORMAL HIGH (ref 70–99)
Glucose, Bld: 110 mg/dL — ABNORMAL HIGH (ref 70–99)
Potassium: 3.1 mmol/L — ABNORMAL LOW (ref 3.5–5.1)
Potassium: 3.6 mmol/L (ref 3.5–5.1)
Sodium: 140 mmol/L (ref 135–145)
Sodium: 138 mmol/L (ref 135–145)

## 2023-12-27 LAB — HIV ANTIBODY (ROUTINE TESTING W REFLEX): HIV Screen 4th Generation wRfx: NONREACTIVE

## 2023-12-27 LAB — CBC
HCT: 41.8 % (ref 39.0–52.0)
HCT: 42.6 % (ref 39.0–52.0)
Hemoglobin: 14 g/dL (ref 13.0–17.0)
Hemoglobin: 14.4 g/dL (ref 13.0–17.0)
MCH: 30.7 pg (ref 26.0–34.0)
MCH: 30.3 pg (ref 26.0–34.0)
MCHC: 33.5 g/dL (ref 30.0–36.0)
MCHC: 33.8 g/dL (ref 30.0–36.0)
MCV: 91.7 fL (ref 80.0–100.0)
MCV: 89.5 fL (ref 80.0–100.0)
Platelets: 173 10*3/uL (ref 150–400)
Platelets: 179 10*3/uL (ref 150–400)
RBC: 4.56 MIL/uL (ref 4.22–5.81)
RBC: 4.76 MIL/uL (ref 4.22–5.81)
RDW: 12.8 % (ref 11.5–15.5)
RDW: 12.8 % (ref 11.5–15.5)
WBC: 13.1 10*3/uL — ABNORMAL HIGH (ref 4.0–10.5)
WBC: 15 10*3/uL — ABNORMAL HIGH (ref 4.0–10.5)
nRBC: 0 % (ref 0.0–0.2)
nRBC: 0 % (ref 0.0–0.2)

## 2023-12-27 LAB — COMPREHENSIVE METABOLIC PANEL WITH GFR
ALT: 22 U/L (ref 0–44)
AST: 17 U/L (ref 15–41)
Albumin: 3.1 g/dL — ABNORMAL LOW (ref 3.5–5.0)
Alkaline Phosphatase: 57 U/L (ref 38–126)
Anion gap: 11 (ref 5–15)
BUN: 16 mg/dL (ref 8–23)
CO2: 25 mmol/L (ref 22–32)
Calcium: 9.2 mg/dL (ref 8.9–10.3)
Chloride: 101 mmol/L (ref 98–111)
Creatinine, Ser: 1.02 mg/dL (ref 0.61–1.24)
GFR, Estimated: >60 mL/min (ref >60)
Glucose, Bld: 115 mg/dL — ABNORMAL HIGH (ref 70–99)
Potassium: 3.4 mmol/L — ABNORMAL LOW (ref 3.5–5.1)
Sodium: 137 mmol/L (ref 135–145)
Total Bilirubin: 0.9 mg/dL (ref 0.0–1.2)
Total Protein: 6.6 g/dL (ref 6.5–8.1)

## 2023-12-27 LAB — TYPE AND SCREEN
ABO/RH(D): O POS
Antibody Screen: NEGATIVE

## 2023-12-27 LAB — TROPONIN I (HIGH SENSITIVITY)
Troponin I (High Sensitivity): 53 ng/L — ABNORMAL HIGH (ref <18)
Troponin I (High Sensitivity): 48 ng/L — ABNORMAL HIGH (ref <18)

## 2023-12-27 LAB — HEPARIN LEVEL (UNFRACTIONATED): Heparin Unfractionated: <0.1 [IU]/mL — ABNORMAL LOW (ref 0.30–0.70)

## 2023-12-27 LAB — BRAIN NATRIURETIC PEPTIDE: B Natriuretic Peptide: 71.4 pg/mL (ref 0.0–100.0)

## 2023-12-27 LAB — MAGNESIUM: Magnesium: 1.9 mg/dL (ref 1.7–2.4)

## 2023-12-27 LAB — PROTIME-INR
INR: 1.1 (ref 0.8–1.2)
Prothrombin Time: 14.5 s (ref 11.4–15.2)

## 2023-12-27 LAB — APTT: aPTT: 29 s (ref 24–36)

## 2023-12-27 LAB — TSH: TSH: 1.552 u[IU]/mL (ref 0.350–4.500)

## 2023-12-27 LAB — ABO/RH: ABO/RH(D): O POS

## 2023-12-27 MED ORDER — OXYCODONE HCL 5 MG PO TABS: 5.0000 mg | ORAL_TABLET | ORAL | Status: DC | PRN

## 2023-12-27 MED ORDER — HYDROMORPHONE HCL 1 MG/ML IJ SOLN: 0.5000 mg | INTRAMUSCULAR | Status: DC | PRN

## 2023-12-27 MED ORDER — LOSARTAN POTASSIUM 25 MG PO TABS: 25.0000 mg | ORAL_TABLET | Freq: Every day | ORAL | Status: DC

## 2023-12-27 MED ORDER — LORATADINE 10 MG PO TABS
10.0000 mg | ORAL_TABLET | Freq: Every day | ORAL | Status: DC
  Administered 2023-12-28 – 2024-01-02 (×6): 10 mg via ORAL
  Filled 2023-12-27 (×6): qty 1

## 2023-12-27 MED ORDER — ONDANSETRON HCL 4 MG PO TABS: 4.0000 mg | ORAL_TABLET | Freq: Four times a day (QID) | ORAL | Status: DC | PRN

## 2023-12-27 MED ORDER — ONDANSETRON HCL 4 MG/2ML IJ SOLN
4.0000 mg | Freq: Four times a day (QID) | INTRAMUSCULAR | Status: DC | PRN
Start: 2023-12-27 — End: 2024-01-02
  Administered 2023-12-31 – 2024-01-01 (×5): 4 mg via INTRAVENOUS
  Filled 2023-12-27 (×6): qty 2

## 2023-12-27 MED ORDER — BUDESON-GLYCOPYRROL-FORMOTEROL 160-9-4.8 MCG/ACT IN AERO: 2.0000 | INHALATION_SPRAY | Freq: Two times a day (BID) | RESPIRATORY_TRACT | Status: DC

## 2023-12-27 MED ORDER — CITALOPRAM HYDROBROMIDE 20 MG PO TABS
20.0000 mg | ORAL_TABLET | Freq: Every morning | ORAL | Status: DC
  Administered 2023-12-27 – 2024-01-02 (×7): 20 mg via ORAL
  Filled 2023-12-27 (×7): qty 1

## 2023-12-27 MED ORDER — POTASSIUM CHLORIDE CRYS ER 20 MEQ PO TBCR: 40.0000 meq | EXTENDED_RELEASE_TABLET | ORAL | Status: DC

## 2023-12-27 MED ORDER — POTASSIUM CHLORIDE CRYS ER 20 MEQ PO TBCR
60.0000 meq | EXTENDED_RELEASE_TABLET | ORAL | Status: AC
  Administered 2023-12-27: 60 meq via ORAL
  Filled 2023-12-27: qty 3

## 2023-12-27 MED ORDER — METOPROLOL TARTRATE 12.5 MG HALF TABLET
12.5000 mg | ORAL_TABLET | Freq: Four times a day (QID) | ORAL | Status: DC
  Administered 2023-12-27 – 2023-12-28 (×3): 12.5 mg via ORAL
  Filled 2023-12-27 (×4): qty 1

## 2023-12-27 MED ORDER — POLYETHYLENE GLYCOL 3350 17 G PO PACK: 17.0000 g | PACK | Freq: Every day | ORAL | Status: DC | PRN

## 2023-12-27 MED ORDER — LEVALBUTEROL HCL 0.63 MG/3ML IN NEBU
0.6300 mg | INHALATION_SOLUTION | Freq: Four times a day (QID) | RESPIRATORY_TRACT | Status: DC | PRN
Start: 2023-12-27 — End: 2023-12-27

## 2023-12-27 MED ORDER — LEVALBUTEROL HCL 0.63 MG/3ML IN NEBU: 0.6300 mg | INHALATION_SOLUTION | Freq: Four times a day (QID) | RESPIRATORY_TRACT | Status: DC

## 2023-12-27 MED ORDER — CHLORHEXIDINE GLUCONATE CLOTH 2 % EX PADS
6.0000 | MEDICATED_PAD | Freq: Every day | CUTANEOUS | Status: DC
  Administered 2023-12-28 – 2024-01-02 (×6): 6 via TOPICAL

## 2023-12-27 MED ORDER — ACETAMINOPHEN 325 MG PO TABS: 650.0000 mg | ORAL_TABLET | Freq: Four times a day (QID) | ORAL | Status: DC | PRN

## 2023-12-27 MED ORDER — LABETALOL HCL 5 MG/ML IV SOLN: 10.0000 mg | INTRAVENOUS | Status: DC | PRN

## 2023-12-27 MED ORDER — BENZONATATE 100 MG PO CAPS
100.0000 mg | ORAL_CAPSULE | Freq: Three times a day (TID) | ORAL | Status: DC
  Administered 2023-12-27 – 2024-01-02 (×17): 100 mg via ORAL
  Filled 2023-12-27 (×17): qty 1

## 2023-12-27 MED ORDER — LEVALBUTEROL HCL 0.63 MG/3ML IN NEBU: 0.6300 mg | INHALATION_SOLUTION | Freq: Four times a day (QID) | RESPIRATORY_TRACT | Status: DC | PRN

## 2023-12-27 MED ORDER — SODIUM CHLORIDE 0.9% FLUSH: 3.0000 mL | INTRAVENOUS | Status: DC | PRN

## 2023-12-27 MED ORDER — CHLORHEXIDINE GLUCONATE CLOTH 2 % EX PADS
6.0000 | MEDICATED_PAD | Freq: Every day | CUTANEOUS | Status: DC
Start: 2023-12-27 — End: 2023-12-27
  Administered 2023-12-27: 6 via TOPICAL

## 2023-12-27 MED ORDER — SODIUM CHLORIDE 0.9 % IV SOLN: INTRAVENOUS | Status: DC

## 2023-12-27 MED ORDER — BUDESON-GLYCOPYRROL-FORMOTEROL 160-9-4.8 MCG/ACT IN AERO
2.0000 | INHALATION_SPRAY | Freq: Two times a day (BID) | RESPIRATORY_TRACT | Status: DC
  Filled 2023-12-27: qty 5.9

## 2023-12-27 MED ORDER — SODIUM CHLORIDE 0.9 % IV BOLUS
500.0000 mL | Freq: Once | INTRAVENOUS | Status: AC
  Administered 2023-12-27: 500 mL via INTRAVENOUS

## 2023-12-27 MED ORDER — ACETAMINOPHEN 650 MG RE SUPP: 650.0000 mg | Freq: Four times a day (QID) | RECTAL | Status: DC | PRN

## 2023-12-27 MED ORDER — SODIUM CHLORIDE 0.9% FLUSH
3.0000 mL | Freq: Two times a day (BID) | INTRAVENOUS | Status: DC
  Administered 2023-12-28 – 2024-01-02 (×11): 3 mL via INTRAVENOUS

## 2023-12-27 MED ORDER — SODIUM CHLORIDE 0.9 % IV SOLN: 250.0000 mL | INTRAVENOUS | Status: AC | PRN

## 2023-12-27 MED ORDER — ONDANSETRON HCL 4 MG/2ML IJ SOLN: 4.0000 mg | Freq: Four times a day (QID) | INTRAMUSCULAR | Status: DC | PRN

## 2023-12-27 MED ORDER — METHYLPREDNISOLONE SODIUM SUCC 40 MG IJ SOLR
40.0000 mg | Freq: Every day | INTRAMUSCULAR | Status: DC
  Administered 2023-12-27 – 2023-12-28 (×2): 40 mg via INTRAVENOUS
  Filled 2023-12-27 (×2): qty 1

## 2023-12-27 MED ORDER — METOPROLOL TARTRATE 5 MG/5ML IV SOLN
2.5000 mg | Freq: Once | INTRAVENOUS | Status: AC
Start: 2023-12-27 — End: 2023-12-27
  Administered 2023-12-27: 2.5 mg via INTRAVENOUS
  Filled 2023-12-27: qty 5

## 2023-12-27 MED ORDER — HEPARIN (PORCINE) 25000 UT/250ML-% IV SOLN
1550.0000 [IU]/h | INTRAVENOUS | Status: DC
  Administered 2023-12-27: 1450 [IU]/h via INTRAVENOUS
  Administered 2023-12-28 – 2024-01-01 (×7): 1550 [IU]/h via INTRAVENOUS
  Filled 2023-12-27 (×8): qty 250

## 2023-12-27 MED ORDER — ACETAMINOPHEN 325 MG PO TABS
650.0000 mg | ORAL_TABLET | Freq: Four times a day (QID) | ORAL | Status: DC | PRN
  Administered 2023-12-27 – 2024-01-01 (×4): 650 mg via ORAL
  Filled 2023-12-27 (×4): qty 2

## 2023-12-27 MED ORDER — GUAIFENESIN ER 600 MG PO TB12
1200.0000 mg | ORAL_TABLET | Freq: Two times a day (BID) | ORAL | Status: DC
  Administered 2023-12-27 – 2024-01-02 (×12): 1200 mg via ORAL
  Filled 2023-12-27 (×12): qty 2

## 2023-12-27 MED ORDER — DILTIAZEM HCL 30 MG PO TABS
30.0000 mg | ORAL_TABLET | Freq: Three times a day (TID) | ORAL | Status: DC
  Administered 2023-12-27 – 2023-12-28 (×2): 30 mg via ORAL
  Filled 2023-12-27 (×3): qty 1

## 2023-12-27 MED ORDER — FUROSEMIDE 10 MG/ML IJ SOLN
20.0000 mg | Freq: Every day | INTRAMUSCULAR | Status: DC
  Administered 2023-12-27: 20 mg via INTRAVENOUS
  Filled 2023-12-27: qty 4

## 2023-12-27 MED ORDER — HEPARIN SODIUM (PORCINE) 5000 UNIT/ML IJ SOLN
5000.0000 [IU] | Freq: Three times a day (TID) | INTRAMUSCULAR | Status: DC
  Administered 2023-12-27: 5000 [IU] via SUBCUTANEOUS
  Filled 2023-12-27: qty 1

## 2023-12-27 NOTE — Progress Notes (Signed)
  Radiation Oncology         ([phone]) [phone] ________________________________  Name: Jose Joseph MRN: [id number]  Date: 12/27/2023  DOB: [birth year]  End of Treatment Note  Diagnosis:     Stage IV, cT4N3M1, NSCLC, adenocarcinoma of the RUL with bone metastases      Indication for treatment:  palliative       Radiation treatment dates:   12/18/23-12/26/23  Site/planned dose:   The left 6th rib and right lung including sternum were to receive 30 Gy in 10 fractions. The patient received 7 of the 10 fractions, totaling a dose of 21 Gy to both isocenters.  Narrative: The patient was hospitalized and found to have more progressive metastatic disease. And was counseled on discontinuing radiation by Dr. Scherrie Curt to be able to proceed with systemic therapy. We will end his current treatment plan.  Plan: The patient will be followed by Dr. Scherrie Curt and or Dr. Cheree Cords and we will be happy to revisit additional radiation if needed.     Shelvia Dick, PAC

## 2023-12-27 NOTE — Plan of Care (Signed)

## 2023-12-27 NOTE — Progress Notes (Addendum)
 [facility] PROGRESS NOTE  Patient: Jose Joseph WUJ:[id number]   PCP: Artemisa Bile, MD DOB: 16-Oct-1955   DOA: 12/26/2023   DOS: 12/27/2023    Subjective: Reports shortness of breath.  Reports dyspnea with exertion.  Reports that he feels his chest is tight when he is walking and doing activity.  Reports that he had some wheezing which seems to have resolved for now.  Denies any chest pain.  No nausea no vomiting.  No headache.  No focal deficit.  No bleeding.  Objective:  Vitals:   12/27/23 1653 12/27/23 1659 12/27/23 1705 12/27/23 1730  BP: (!) 106/59 (!) 130/101 123/89 113/84  Pulse: (!) 118 (!) 103 (!) 115 99  Resp: (!) 21 (!) 25 19 (!) 29  Temp:      TempSrc:      SpO2: 91% 93% 94% 95%  Weight:      Height:       Clear to auscultation.  Coarse breath sounds. S1-S2 present irregular. No significant edema lower extremity. Speech clear.  Alert and oriented x 3. Heart rate ranges from 100-140.  Assessment and plan: New onset atrial fibrillation with RVR. Discussed with cardiology. Currently on Cardizem drip, oral Cardizem, oral metoprolol as well as cardiology ordered IV Lopressor. Will discontinue losartan and Lasix to allow room for blood pressure. May require anemia given his soft blood pressure right now if remains tachycardic. IV normal saline bolus 500 mL.  Followed by IV normal saline 125 mill per hour. Cardiology recommended to start the patient on heparin  given high CHA2DS2-VASc score.  Neurology was consulted earlier and recommended that it would be okay to start the patient on therapeutic anticoagulation in the setting of prior stroke. Discussed with patient.  Main concern would be bleeding and worsening of pericardial effusion.  But given high CHA2DS2-VASc score risk of stroke is high. Patient understand the risk and wants to proceed with therapeutic anticoagulation. Will initiate heparin  without bolus. Check type and screen. Management per cardiology.  Large  pericardial effusion. Per my conversation with cardiology currently no plan for intervention. N.p.o. after midnight.  Interventional team will evaluate the patient in the morning. Patient can have diet.  Bilateral pleural effusion. Volume is not significant to be responsible for patient's dyspnea on exertion solely. Ultrasound thoracentesis was earlier ordered.  Hopefully will be done tomorrow.  Stage IV metastatic right upper lobe lung cancer. Was receiving XRT. Dr. Scherrie Curt was consulted.  Primary oncologist is Dr. Cheree Cords. Appreciate oncology assistance.  Recommend to initiate systemic chemotherapy and stop radiation. Prognosis is guarded.  COPD Will provide as needed nebulizer therapy.  Currently no wheezing on my exam.  Continue inhalers.  Continue Mucinex.  Add flutter valve and incentive spirometry for pulmonary toilet. Given history of COPD and CT showing evidence of bronchitis, patient is currently receiving IV antibiotics.  Procalcitonin is negative.  Unsure if this is infection.  Concerned this could be more likely progression of his cancer or  pneumonitis.  (Too early for radiation pneumonitis) will give IV steroids and monitor.  Goals of care conversation. Patient tells me that he wants to fight "whatever this is" and wants to live until 58.  Currently full code.  Patient has provided consent for blood transfusion if indicated. Transfuse for hemoglobin less than 8 or hemodynamic instability.  Discussed with cardiology, discussed with attending at the other facility as well as RN about plan of care.  Author: Charlean Congress, MD [facility] 12/27/2023 6:07 PM   If 7PM-7AM,  please contact night-coverage at [email or web address]

## 2023-12-27 NOTE — ED Notes (Signed)
 [facility] called.

## 2023-12-27 NOTE — Progress Notes (Addendum)
 Rounding Note    Patient Name: Jose Joseph Date of Encounter: 12/27/2023  [facility] Cardiologist: Olinda Bertrand, DO  Chief Complaint:  Chief Complaint  Patient presents with   Shortness of Breath  Reason of consult: Pericardial effusion  Subjective   Less short of breath compared to yesterday. Laying in bed in the left lateral decubitus position. Denies anginal chest pain New onset of A-fib with RVR as of yesterday night 12/26/2023. Not on anticoagulation.  Inpatient Medications    Scheduled Meds:  diltiazem  30 mg Oral Q8H   furosemide  20 mg Intravenous Daily   losartan  25 mg Oral Q2200   Continuous Infusions:  azithromycin  Stopped (12/26/23 1757)   cefTRIAXone (ROCEPHIN)  IV Stopped (12/26/23 1653)   diltiazem (CARDIZEM) infusion 10 mg/hr (12/27/23 1058)   lactated ringers  100 mL/hr at 12/27/23 1254   PRN Meds: acetaminophen , ondansetron  (ZOFRAN ) IV   Vital Signs    Vitals:   12/27/23 0800 12/27/23 1059 12/27/23 1100 12/27/23 1215  BP: 121/82 120/81  116/64  Pulse: 79 97  92  Resp: 18 (!) 23  17  Temp:   97.8 F (36.6 C)   TempSrc:      SpO2: 95% 96%  98%  Weight:      Height:        Intake/Output Summary (Last 24 hours) at 12/27/2023 1401 Last data filed at 12/27/2023 0322 Gross per 24 hour  Intake --  Output 700 ml  Net -700 ml      12/26/2023   10:20 AM 12/21/2023   10:13 AM 12/15/2023   11:42 AM  Last 3 Weights  Weight (lbs) 261 lb 266 lb 264 lb  Weight (kg) 118.389 kg 120.657 kg 119.75 kg      Telemetry    A-fib with controlled ventricular rate- Personally Reviewed  ECG    12/27/2023 atrial fibrillation with rapid ventricular rate, 128 bpm, low voltage, ST-T changes, occasional PVCs- Personally Reviewed  Physical Exam   General: Age appropriate, hemodynamically stable, mild distress due to dyspnea HEENT: Moist membranes, trachea midline, JVP, port present Lungs: Decreased breath sounds bilaterally, no rales or  rhonchi Heart: Irregularly, irregular, tachycardic, variable S1-S2, no murmurs rubs or gallops appreciated secondary to tachycardia Abdomen: Soft, nontender, nondistended, positive bowel sounds in all 4 quadrants Extremities: Cool to touch, trace pitting edema, no cyanosis or clubbing Neuro: Moves all 4 extremities, alert oriented x 4  Labs    High Sensitivity Troponin:   Recent Labs  Lab 12/26/23 1026 12/26/23 1314 12/26/23 2325 12/27/23 0408  TROPONINIHS 57* 57* 53* 48*     Chemistry Recent Labs  Lab 12/26/23 1026 12/26/23 2325 12/27/23 0408  NA 139 140 138  K 3.4* 3.1* 3.6  CL 102 104 104  CO2 25 26 24   GLUCOSE 89 130* 110*  BUN 19 21 23   CREATININE 0.75 1.08 0.92  CALCIUM 9.7 9.1 9.0  MG  --  1.9  --   GFRNONAA >60 >60 >60  ANIONGAP 12 10 10     Lipids No results for input(s): "CHOL", "TRIG", "HDL", "LABVLDL", "LDLCALC", "CHOLHDL" in the last 168 hours.  Hematology Recent Labs  Lab 12/26/23 1026 12/27/23 0408  WBC 16.1* 13.1*  RBC 5.08 4.56  HGB 15.4 14.0  HCT 47.3 41.8  MCV 93.1 91.7  MCH 30.3 30.7  MCHC 32.6 33.5  RDW 12.7 12.8  PLT 192 173   Thyroid  Recent Labs  Lab 12/27/23 0408  TSH 1.552  BNP Recent Labs  Lab 12/27/23 0408  BNP 71.4    DDimer No results for input(s): "DDIMER" in the last 168 hours.   Radiology    MRI w/ and /wo contrast:  April 2025 1. Small foci of diffusion-weighted signal abnormality within the anterior right frontal lobe, right parietooccipital lobes, left parietal lobe and right cerebellar hemisphere (measuring up to 5 mm). There is no appreciable corresponding pathologic enhancement at these sites, and these likely reflect acute/subacute infarcts (involvement of multiple vascular territories concerning for an embolic process). However, a short-interval follow-up brain MRI is recommended in 6 weeks to ensure expected evolution and to exclude poorly enhancing metastases. 2. Background T2 FLAIR hyperintense  signal changes within the cerebral white matter (moderate) and pons (mild), nonspecific but most often secondary to chronic small vessel ischemia. 3. 10 mm left maxillary sinus mucous retention cyst.  CTA PE Study 12/26/2023 1. No pulmonary embolism. Diffuse bronchial wall thickening with perihilar ground-glass airspace opacities in both lungs. This could represent changes of developing multifocal pneumonia versus early pulmonary edema. Small right and small to moderate left pleural effusions. 2. Worsening metastatic lymphadenopathy in the lower neck and chest, as delineated above. 3. Similar size and number of scattered bilateral pulmonary metastases, the largest in the right lower lobe measures 1.6 cm (previously 1.5 cm). 4. Progressive bony metastatic disease, now involving the right anterolateral first rib. increased size of the left posterolateral sixth rib and inferior sternal body lesions. 5. Interval development of a small nodule in the right adrenal gland, measuring 1.1 cm, worrisome for new metastatic disease.  Cardiac Studies   Echo 12/25/23:  1. Left ventricular ejection fraction, by estimation, is 55 to 60%. The  left ventricle has normal function. The left ventricle has no regional  wall motion abnormalities. Left ventricular diastolic parameters are  consistent with Grade I diastolic  dysfunction (impaired relaxation).   2. Right ventricular systolic function is normal. The right ventricular  size is normal.   3. Large pericardial effusion. The pericardial effusion is  circumferential. There is no evidence of cardiac tamponade.   4. The mitral valve is normal in structure. Trivial mitral valve  regurgitation. No evidence of mitral stenosis.   5. The aortic valve is tricuspid. There is mild calcification of the  aortic valve. Aortic valve regurgitation is not visualized. Aortic valve  sclerosis/calcification is present, without any evidence of aortic  stenosis.   6. The  inferior vena cava is dilated in size with >50% respiratory  variability, suggesting right atrial pressure of 8 mmHg.  Patient Profile     68 y.o. male former smoker, COPD, stage IV lung cancer with bone mets, pericardial effusion, pleural effusion, newly discovered atrial fibrillation, documented history of strokes (MRI April 2025).  Assessment & Plan   Assessment and plan: Shortness of breath Multifactorial: Worsening cancer burden, parenchymal disease, pericardial effusion, pleural effusion, hypertensive urgency on arrival, and new onset of A-fib with RVR Management as discussed below  Pericardial effusion: Noted incidentally on an echocardiogram performed for elevated blood pressures. Reported to be a large pericardial effusion on independent review it appears to be moderate to large circumferential Reached out to interventional cardiology yesterday Dr. Addie Holstein who felt that based on location pericardial window may be a better option. I also reached out to cardiothoracic surgery and Dr. Deloise Ferries has reviewed the images and preliminary recommends considering thoracentesis and/or pericardiocentesis as opposed to pericardial window. Patient was supposed to transfer to [facility] but due to bed availability  still remains at [facility]. I have asked attending physician Dr. Betsey Brow to consult radiology to see if ultrasound-guided thoracentesis is an option as it may provide symptomatic relief to the patient. If and when he gets to [facility] we will review the case with interventional cardiology as well as cardiothoracic surgery for further guidance. Clinically he did not have tamponade physiology on his initial echo.  Newly discovered atrial fibrillation with rapid ventricular rate: Rate control: Cardizem drip. Rhythm control: N/A. Thromboembolic prophylaxis: None After discussing the case with the attending physician neurology was contacted to see if he was safe to start anticoagulation systemically  given his recent MRI in April which noted acute/subacute stroke.  In addition, I also spoke to the patient with regards to his CHA2DS2-VASc SCORE which provides the likely probability of thromboembolic event.  Risks, benefits, and alternatives to anticoagulation were discussed.  My concern is that he does have a pericardial effusion which can or cannot become hemorrhagic at some point which can lead to worsening morbidity and mortality.  Patient understands the potential complication but still would like to be on oral anticoagulation for thromboembolic prophylaxis as easily had documented stroke on imaging.  I have asked him to discuss this further with his wife as well as part of informed decision making. Would recommend starting IV heparin  drip and monitoring him clinically and repeating an echocardiogram in 48 hours to reevaluate the size of pericardial effusion and its hemodynamic significance.  Prior to converting him to oral anticoagulation. Will start him on Cardizem orally so that we can wean off drip.  Click Here to Calculate/Change CHADS2VASc Score The patient's CHADS2-VASc score is 5, indicating a 7.2% annual risk of stroke.    Stage IV lung cancer with bone mets: Oncology documentation reviewed.  Given the progression of disease recommends initiating treatment sooner than later.  Pleural effusion: Primary team to investigate if pleural effusions are sizable for thoracentesis.  Hypertensive urgency: Improving. Start losartan 25 mg p.o. daily and Lasix 20 mg IV push daily.  COPD: Management per primary team  Former smoker  For questions or updates, please contact [facility] Please consult [email or web address] for contact info under   Total time spent: 55 minutes.    Signed, Awilda Bogus, [facility]  [facility]  Pager: [phone] Office: [phone] 12/27/2023, 2:01 PM

## 2023-12-27 NOTE — Plan of Care (Signed)
 On-call neurology note Called by Maryan Smalling hospitalist regarding patient who is being admitted for pericardial effusion, needs to get pericardiocentesis.  Had strokes in late April-imaging reviewed-small stroke burden-likely embolic in the setting of hypercoagulability. Cardiology wanted clarification on timing of anticoagulation.  Given the size of the strokes, should be okay to anticoagulate him now. Consider repeat head imaging only if he has new symptoms from a neurological standpoint  -- Tona Francis, MD Neurologist [facility]

## 2023-12-27 NOTE — Plan of Care (Signed)

## 2023-12-27 NOTE — Progress Notes (Signed)
 PHARMACY - ANTICOAGULATION CONSULT NOTE  Pharmacy Consult for heparin  gtt Indication: atrial fibrillation  Allergies  Allergen Reactions   Bee Venom Anaphylaxis    syncope    Patient Measurements: Height: 6\' 1"  (185.4 cm) Weight: 116.5 kg (256 lb 13.4 oz) IBW/kg (Calculated) : 79.9 HEPARIN  DW (KG): 104.9  Vital Signs: Temp: 98 F (36.7 C) (05/14 1628) Temp Source: Oral (05/14 1628) BP: 131/89 (05/14 1807) Pulse Rate: 113 (05/14 1807)  Labs: Recent Labs    12/26/23 1026 12/26/23 1314 12/26/23 2325 12/27/23 0408  HGB 15.4  --   --  14.0  HCT 47.3  --   --  41.8  PLT 192  --   --  173  LABPROT  --   --   --  14.5  INR  --   --   --  1.1  CREATININE 0.75  --  1.08 0.92  TROPONINIHS 57* 57* 53* 48*    Estimated Creatinine Clearance: 101.3 mL/min (by C-G formula based on SCr of 0.92 mg/dL).   Assessment: 68 yo M with PMG stage IV metastatic lung cancer with afib + RVR. Pt also with small stroke burden from stroke in April 2025- likely embolic per neuro. No anticoag PTA. Ok to start anticoagulation per neuro, cardiology, and [facility] primary. Pharmacy consulted to start heparin  infusion (no bolus)   Hgb 14.0, Plt 173   Goal of Therapy:  Heparin  level 0.3-0.7 units/ml Monitor platelets by anticoagulation protocol: Yes   Plan:  No bolus Heparin  1450 units/hr (~13.9units/kg/hr of heparin  dosing weight) 6hr HL Daily HL, CBC F/u s/sx bleeding, long term anticoag plans  Oralee Billow, PharmD, BCCCP Clinical Pharmacist 12/27/2023 6:12 PM

## 2023-12-27 NOTE — Progress Notes (Addendum)
 IP PROGRESS NOTE  Subjective:   Jose Joseph remains in the emergency room overnight.  He continues to have dyspnea.  He appeared significantly dyspneic when I saw him after ambulating to the restroom.  He reports more dyspnea when supine.  He has tenderness at the sternum.  No other pain.  Objective: Vital signs in last 24 hours: Blood pressure 119/85, pulse (!) 109, temperature 98 F (36.7 C), temperature source Oral, resp. rate (!) 25, height 6\' 1"  (1.854 m), weight 261 lb (118.4 kg), SpO2 95%.  Intake/Output from previous day: 05/13 0701 - 05/14 0700 In: -  Out: 700 [Urine:700]  Physical Exam:   Lungs: Decreased breath sounds at the left posterior chest, mild inspiratory wheeze at the right posterior chest Cardiac: Tachycardia, irregular Abdomen: No hepatosplenomegaly, nontender Extremities: Ace pretibial edema bilaterally   Portacath/PICC-without erythema  Lab Results: Recent Labs    12/26/23 1026 12/27/23 0408  WBC 16.1* 13.1*  HGB 15.4 14.0  HCT 47.3 41.8  PLT 192 173    BMET Recent Labs    12/26/23 2325 12/27/23 0408  NA 140 138  K 3.1* 3.6  CL 104 104  CO2 26 24  GLUCOSE 130* 110*  BUN 21 23  CREATININE 1.08 0.92  CALCIUM 9.1 9.0    No results found for: "CEA1", "CEA", "ZOX096", "CA125"  Studies/Results: CT Angio Chest Pulmonary Embolism (PE) W or WO Contrast Result Date: 12/26/2023 CLINICAL DATA:  Pulmonary embolism (PE) suspected, high prob EXAM: CT ANGIOGRAPHY CHEST WITH CONTRAST TECHNIQUE: Multidetector CT imaging of the chest was performed using the standard protocol during bolus administration of intravenous contrast. Multiplanar CT image reconstructions and MIPs were obtained to evaluate the vascular anatomy. RADIATION DOSE REDUCTION: This exam was performed according to the departmental dose-optimization program which includes automated exposure control, adjustment of the mA and/or kV according to patient size and/or use of iterative  reconstruction technique. CONTRAST:  75mL OMNIPAQUE IOHEXOL 350 MG/ML SOLN COMPARISON:  Dec 26, 2023, November 17, 2023 FINDINGS: Pulmonary Embolism: No pulmonary embolism. Cardiovascular: No cardiomegaly. Moderate volume pericardial effusion. No aortic aneurysm. Mediastinum/Nodes: Enlarged left supraclavicular lymph node measuring 2.5 x 3.7 cm (axial 7). Multiple mildly enlarged right supraclavicular lymph nodes also present measuring up to 1.1 cm (axial 5). Significant enlargement of conglomerate lymph nodes along the left mediastinum/AP window, measuring 5.6 x 3.6 cm (axial 51), previously measuring 3.8 x 2.1 cm. Upper left internal mammary lymph nodes have also increased in size in the interim measuring 4.3 by 4 cm (previously 2.2 x 2.5 cm). Increased fullness of the inferior left internal mammary lymph nodes also present. Significant progression of the bilateral hilar lymphadenopathy. For example, anterior right hilar lymph node measures 2.3 cm in short axis dimension (previously 1.7 cm). Lungs/Pleura: The trachea is midline and patent. Diffuse bronchial wall thickening. Patchy perihilar ground-glass airspace opacities in the right upper and right lower lobes and the left upper lobe, lingula, and left lower lobe. Similar size of the spiculated mass in the perihilar right upper lobe, measuring 2.7 x 2.9 cm. Worsening consolidation of the right middle lobe. Overall, similar size and number of scattered pulmonary metastases throughout both lungs. For example, the largest nodule in the superior segment of the right lower lobe measures 1.6 cm (previously 1.5 cm). No pneumothorax. Small right and small to moderate left pleural effusions. Musculoskeletal: Bony cortical destruction in the anterolateral right first rib (axial 18). Bony cortical destruction of the posterolateral left sixth rib with overlying periosteal reaction. Enlarging destructive lesion  in the inferior sternum. Upper Abdomen: 1.1 cm nodule in the right  adrenal gland. Review of the MIP images confirms the above findings. IMPRESSION: 1. No pulmonary embolism. Diffuse bronchial wall thickening with perihilar ground-glass airspace opacities in both lungs. This could represent changes of developing multifocal pneumonia versus early pulmonary edema. Small right and small to moderate left pleural effusions. 2. Worsening metastatic lymphadenopathy in the lower neck and chest, as delineated above. 3. Similar size and number of scattered bilateral pulmonary metastases, the largest in the right lower lobe measures 1.6 cm (previously 1.5 cm). 4. Progressive bony metastatic disease, now involving the right anterolateral first rib. increased size of the left posterolateral sixth rib and inferior sternal body lesions. 5. Interval development of a small nodule in the right adrenal gland, measuring 1.1 cm, worrisome for new metastatic disease. Electronically Signed   By: Rance Burrows M.D.   On: 12/26/2023 14:18   DG Chest 2 View Result Date: 12/26/2023 CLINICAL DATA:  Shortness of breath.  Known malignancy EXAM: CHEST - 2 VIEW COMPARISON:  X-ray 12/15/2023 FINDINGS: Stable right IJ chest port. Tip along the central SVC. Stable cardiopericardial silhouette with tortuous aorta ectatic aorta. Underinflated x-rays which are under penetrated. Bronchovascular crowding. The bandlike opacity right perihilar has improved from previous. Slightly more opacity at the medial right lung base however. No pneumothorax or effusion. Overlapping cardiac leads. IMPRESSION: Changing opacity. Improving right mid lung perihilar. Increasing right lung base. Recommend continued follow-up. Decreasing left lung base opacity. Electronically Signed   By: Adrianna Horde M.D.   On: 12/26/2023 12:29   ECHOCARDIOGRAM COMPLETE Result Date: 12/26/2023    ECHOCARDIOGRAM REPORT   Patient Name:   Jose Joseph Date of Exam: 12/26/2023 Medical Rec #:  [id number]     Height:       73.0 in Accession #:    [id number]     Weight:       266.0 lb Date of Birth:  1956/06/28     BSA:          2.429 m Patient Age:    67 years      BP:           134/77 mmHg Patient Gender: M             HR:           114 bpm. Exam Location:  Inpatient Procedure: 2D Echo, Cardiac Doppler and Color Doppler (Both Spectral and Color            Flow Doppler were utilized during procedure). Indications:    Chemo Z09  History:        Patient has no prior history of Echocardiogram examinations.  Sonographer:    Astrid Blamer Referring Phys: [phone] Paulett Boros IMPRESSIONS  1. Left ventricular ejection fraction, by estimation, is 55 to 60%. The left ventricle has normal function. The left ventricle has no regional wall motion abnormalities. Left ventricular diastolic parameters are consistent with Grade I diastolic dysfunction (impaired relaxation).  2. Right ventricular systolic function is normal. The right ventricular size is normal.  3. Large pericardial effusion. The pericardial effusion is circumferential. There is no evidence of cardiac tamponade.  4. The mitral valve is normal in structure. Trivial mitral valve regurgitation. No evidence of mitral stenosis.  5. The aortic valve is tricuspid. There is mild calcification of the aortic valve. Aortic valve regurgitation is not visualized. Aortic valve sclerosis/calcification is present, without any evidence of aortic stenosis.  6. The  inferior vena cava is dilated in size with >50% respiratory variability, suggesting right atrial pressure of 8 mmHg. Conclusion(s)/Recommendation(s): There is a moderate to large circumfrential pericardial effusion without over tamponade. Will contact patient for cardiology follow-up. FINDINGS  Left Ventricle: Left ventricular ejection fraction, by estimation, is 55 to 60%. The left ventricle has normal function. The left ventricle has no regional wall motion abnormalities. The left ventricular internal cavity size was normal in size. There is  no left ventricular  hypertrophy. Left ventricular diastolic parameters are consistent with Grade I diastolic dysfunction (impaired relaxation). Right Ventricle: The right ventricular size is normal. No increase in right ventricular wall thickness. Right ventricular systolic function is normal. Left Atrium: Left atrial size was normal in size. Right Atrium: Right atrial size was normal in size. Pericardium: A large pericardial effusion is present. The pericardial effusion is circumferential. There is no evidence of cardiac tamponade. Mitral Valve: The mitral valve is normal in structure. Trivial mitral valve regurgitation. No evidence of mitral valve stenosis. Tricuspid Valve: The tricuspid valve is normal in structure. Tricuspid valve regurgitation is not demonstrated. No evidence of tricuspid stenosis. Aortic Valve: The aortic valve is tricuspid. There is mild calcification of the aortic valve. Aortic valve regurgitation is not visualized. Aortic valve sclerosis/calcification is present, without any evidence of aortic stenosis. Aortic valve mean gradient measures 3.0 mmHg. Aortic valve peak gradient measures 4.7 mmHg. Aortic valve area, by VTI measures 2.53 cm. Pulmonic Valve: The pulmonic valve was not well visualized. Pulmonic valve regurgitation is not visualized. No evidence of pulmonic stenosis. Aorta: The aortic root is normal in size and structure. Venous: The inferior vena cava is dilated in size with greater than 50% respiratory variability, suggesting right atrial pressure of 8 mmHg. IAS/Shunts: No atrial level shunt detected by color flow Doppler.  LEFT VENTRICLE PLAX 2D LVIDd:         3.70 cm LVIDs:         2.70 cm LV PW:         1.00 cm LV IVS:        0.80 cm LVOT diam:     2.00 cm LV SV:         44 LV SV Index:   18 LVOT Area:     3.14 cm  RIGHT VENTRICLE RV S prime:     19.10 cm/s TAPSE (M-mode): 2.4 cm LEFT ATRIUM             Index        RIGHT ATRIUM           Index LA Vol (A2C):   31.7 ml 13.05 ml/m  RA Area:      17.00 cm LA Vol (A4C):   26.3 ml 10.83 ml/m  RA Volume:   49.70 ml  20.46 ml/m LA Biplane Vol: 28.9 ml 11.90 ml/m  AORTIC VALVE AV Area (Vmax):    2.45 cm AV Area (Vmean):   2.41 cm AV Area (VTI):     2.53 cm AV Vmax:           108.00 cm/s AV Vmean:          80.800 cm/s AV VTI:            0.175 m AV Peak Grad:      4.7 mmHg AV Mean Grad:      3.0 mmHg LVOT Vmax:         84.20 cm/s LVOT Vmean:        62.100  cm/s LVOT VTI:          0.141 m LVOT/AV VTI ratio: 0.81  AORTA Ao Root diam: 3.20 cm  SHUNTS Systemic VTI:  0.14 m Systemic Diam: 2.00 cm Jules Oar MD Electronically signed by Jules Oar MD Signature Date/Time: 12/26/2023/10:24:16 AM    Final     Medications: I have reviewed the patient's current medications.  Assessment/Plan:  Stage IV non-small cell lung cancer 10/19/2023 PET-bilateral lung nodules, dominant right upper lobe spiculated lesion, by lateral hilar and mediastinal lymphadenopathy, right supraclavicular lymphadenopathy, hypermetabolic bone lesions at the sternum, left sixth rib, and right iliac Bronchoscopy/biopsy 11/21/2023-right upper lobe nodule and level 4R/11R lymph nodes-adenocarcinoma, BRAF V600 E, PD-L1 TPS 95% 12/06/2023: MRI brain-small foci of diffusion signal abnormality without corresponding enhancement, likely acute/subacute infarcts Scheduled to begin encorafenib  and binimetinib  01/01/2024 Palliative radiation to the lung, sternum, and ribs beginning 12/15/2023 CT chest 12/26/2023: Enlarged left supraclavicular node, significant enlargement of lymph nodes at the left mediastinum/AP window, left internal mammary nodes and bilateral hilar nodes, groundglass airspace disease in the bilateral lungs, worsening right middle lobe consolidation, progressive bone metastases, new right adrenal nodule, small right and small to moderate left pleural effusion, moderate pericardial effusion   2.  Circumferential pericardial effusion on echocardiogram 12/26/2023 3.   Cough/dyspnea secondary to #1-probable components of effusions and airspace disease    Jose Joseph has metastatic non-small cell lung cancer.  He continues to have dyspnea and is in rapid atrial fibrillation this morning.  He remains in the emergency room while waiting on transport to [facility].  I recommend beginning systemic therapy as soon as possible.  I think it is unlikely he will gain significant benefit from further radiation.  The dyspnea is likely multifactorial with atrial fibrillation, the pericardial effusion, COPD, and airspace disease contributing.  Recommendations: Management of atrial fibrillation per the medical and cardiology services Consider diagnostic/therapeutic pericardiocentesis Discontinue radiation Continue antibiotics Begin encorafenib /binimetinib  as soon as possible, we will try to arrange for delivery of these medications to his hospital room  LOS: 1 day   Coni Deep, MD   12/27/2023, 8:02 AM

## 2023-12-27 NOTE — ED Notes (Signed)
Called pharmacy for another bag of cardizem

## 2023-12-27 NOTE — Progress Notes (Signed)
 PROGRESS NOTE  Jose Joseph  ZOX:[id number] DOB: 09-18-1955 DOA: 12/26/2023 PCP: Artemisa Bile, MD  Consultants  Brief Narrative: 68 y.o. male with PMH significant of stage IV lung NSCLC, diagnosed in January of this year has been following oncology in [place], Dr. Cheree Cords, underwent biopsy on 11/21/2023 which showed adenocarcinoma, metastatic to sternum and rib.  Patient is receiving radiation therapy (Dr. Jeryl Moris), had a port placed on 12/14/2023 and due to start chemo on 01/01/2024.  Due to new chemotherapy and hypertension noticed during port placement, echocardiogram was ordered by his oncologist, which showed a large pericardial effusion without tamponade.  Patient has progressively worsening shortness of breath, chest tightness, dyspnea on exertion for last 2 weeks.  No lower extremity edema, sitting upright.   Assessment & Plan: Pericardial effusion, dyspnea on exertion -Elective 2D echo done for initiation of new chemotherapy and hypertension showed preserved LVEF, moderate to large pericardial effusion, no tamponade.  Patient however appears to be symptomatic with shortness of breath, DOE, sitting upright, chest tightness.  Has continued throughout ER stay including on my exam. - Cardiology has been consulted, recommended admission to [facility] -Cardiothoracic surgery has been consulted for pericardial window initially, likely this a.m.  However patient was not able to be transferred until later this afternoon. - Discussed with cardiology this afternoon as well and seems more likely pericardiocentesis rather than pericardial window for this patient. - Remains NPO.  A-fib with RVR: - Patient has been on Cardizem drip throughout the day. - Heart rate has generally been well-controlled although still with a few episodes of tachycardia. - Appreciate cardiology input. - Appreciate cardiology input regarding possible initiation of anticoagulation. - Discussed case with Maryan Smalling neurology  after findings of stroke on MRI in April of this year.  Low likelihood for hemorrhagic transformation.  Defer anticoagulation initiation to cardiology.  Pleural effusion: - Small to moderate effusion noted on CT of chest. - Order for IR today for consideration of thoracentesis but patient was transferred before they were able to evaluate him.     Malignant neoplasm of upper lobe of right lung (HCC), stage IV metastatic to ribs and sternum - Patient currently receiving XRT with Dr. Jeryl Moris who was notified about patient's admission - Pain control, oncology notified - Dr. Scherrie Curt is following, appreciate input.   Hypertensive urgency - Much better on Cardizem drip.     Chronic obstructive pulmonary disease (HCC)  -Currently no acute wheezing,  - Placed on Xopenex nebs as needed, continue Trelegy - Placed on IV Zithromax , Rocephin         DVT prophylaxis:  heparin  injection 5,000 Units Start: 12/27/23 1730  Code Status:   Code Status: Full Code Family Communication: Wife and sister at bedside.  Answered all questions. Level of care: Progressive Status is: Inpatient  Consults called: Medical oncology, cardiology, cardiothoracic surgery  Subjective: Patient still with some shortness of breath.  Better when he is propped over table or laying on his side.  Has some chest pain if he lays flat on his back as well as difficulty breathing but this is better when he sits up.  He is hungry and has not yet had anything to eat today.  No other complaints or concerns.  Objective: Vitals:   12/27/23 1215 12/27/23 1345 12/27/23 1628 12/27/23 1633  BP: 116/64 129/80 120/83   Pulse: 92 (!) 101 (!) 138   Resp: 17 (!) 25    Temp:   98 F (36.7 C)   TempSrc:  Oral   SpO2: 98% 96% 93%   Weight:    116.5 kg  Height:    6\' 1"  (1.854 m)    Intake/Output Summary (Last 24 hours) at 12/27/2023 1711 Last data filed at 12/27/2023 0322 Gross per 24 hour  Intake --  Output 700 ml  Net -700 ml    Filed Weights   12/26/23 1020 12/27/23 1633  Weight: 118.4 kg 116.5 kg   Body mass index is 33.89 kg/m.  Gen: 68 y.o. male, Nontoxic.  Initially lying on his side and then sat up to tripod over bedside table.  No distress however Pulm: Lungs fairly clear on my exam.  Has slightly increased respiratory rate. CV: Irregularly irregular but not tachycardic on my exam.  He was in the emergency room and his sister was on the phone and I was not able to hear pericardial rub GI: Abdomen soft, non-tender, non-distended Ext: Warm, no deformities, no pedal edema Skin: No rashes, lesions no ulcers Neuro: Alert and oriented. No focal neurological deficits. Psych: Calm  Judgement and insight appear normal. Mood & affect appropriate.     I have personally reviewed the following labs and images: CBC: Recent Labs  Lab 12/26/23 1026 12/27/23 0408  WBC 16.1* 13.1*  HGB 15.4 14.0  HCT 47.3 41.8  MCV 93.1 91.7  PLT 192 173   BMP &GFR Recent Labs  Lab 12/26/23 1026 12/26/23 2325 12/27/23 0408  NA 139 140 138  K 3.4* 3.1* 3.6  CL 102 104 104  CO2 25 26 24   GLUCOSE 89 130* 110*  BUN 19 21 23   CREATININE 0.75 1.08 0.92  CALCIUM 9.7 9.1 9.0  MG  --  1.9  --    Estimated Creatinine Clearance: 101.3 mL/min (by C-G formula based on SCr of 0.92 mg/dL). Liver & Pancreas: No results for input(s): "AST", "ALT", "ALKPHOS", "BILITOT", "PROT", "ALBUMIN" in the last 168 hours. No results for input(s): "LIPASE", "AMYLASE" in the last 168 hours. No results for input(s): "AMMONIA" in the last 168 hours. Diabetic: No results for input(s): "HGBA1C" in the last 72 hours. No results for input(s): "GLUCAP" in the last 168 hours. Cardiac Enzymes: No results for input(s): "CKTOTAL", "CKMB", "CKMBINDEX", "TROPONINI" in the last 168 hours. No results for input(s): "PROBNP" in the last 8760 hours. Coagulation Profile: Recent Labs  Lab 12/27/23 0408  INR 1.1   Thyroid Function Tests: Recent Labs     12/27/23 0408  TSH 1.552   Lipid Profile: No results for input(s): "CHOL", "HDL", "LDLCALC", "TRIG", "CHOLHDL", "LDLDIRECT" in the last 72 hours. Anemia Panel: No results for input(s): "VITAMINB12", "FOLATE", "FERRITIN", "TIBC", "IRON", "RETICCTPCT" in the last 72 hours. Urine analysis:    Component Value Date/Time   COLORURINE YELLOW 01/12/2021 1306   APPEARANCEUR CLEAR 01/12/2021 1306   APPEARANCEUR Clear 04/10/2020 1334   LABSPEC 1.019 01/12/2021 1306   PHURINE 5.0 01/12/2021 1306   GLUCOSEU NEGATIVE 01/12/2021 1306   HGBUR NEGATIVE 01/12/2021 1306   BILIRUBINUR NEGATIVE 01/12/2021 1306   BILIRUBINUR Negative 04/10/2020 1334   KETONESUR NEGATIVE 01/12/2021 1306   PROTEINUR NEGATIVE 01/12/2021 1306   NITRITE NEGATIVE 01/12/2021 1306   LEUKOCYTESUR NEGATIVE 01/12/2021 1306   Sepsis Labs: Invalid input(s): "PROCALCITONIN", "LACTICIDVEN"  Microbiology: Recent Results (from the past 240 hours)  Culture, blood (Routine X 2) w Reflex to ID Panel     Status: None (Preliminary result)   Collection Time: 12/26/23  4:18 PM   Specimen: BLOOD  Result Value Ref Range Status  Specimen Description   Final    BLOOD RIGHT ANTECUBITAL Performed at [facility], [address] [place], [place] [address]    Special Requests   Final    BOTTLES DRAWN AEROBIC AND ANAEROBIC Blood Culture results may not be optimal due to an inadequate volume of blood received in culture bottles Performed at [facility], [address] [place], [place] [address]    Culture   Final    NO GROWTH < 24 HOURS Performed at [facility], [address], [place], [place] [address]    Report Status PENDING  Incomplete  Culture, blood (Routine X 2) w Reflex to ID Panel     Status: None (Preliminary result)   Collection Time: 12/26/23  4:34 PM   Specimen: BLOOD LEFT WRIST  Result Value Ref Range Status   Specimen Description   Final    BLOOD LEFT WRIST Performed at  [facility], [address] [place], [place] [address]    Special Requests   Final    BOTTLES DRAWN AEROBIC AND ANAEROBIC Blood Culture results may not be optimal due to an inadequate volume of blood received in culture bottles Performed at [facility], [address] [place], [place] [address]    Culture   Final    NO GROWTH < 24 HOURS Performed at [facility], [address], [place], [place] [address]    Report Status PENDING  Incomplete    Radiology Studies: CT HEAD WO CONTRAST ( ) Result Date: 12/27/2023 CLINICAL DATA:  Stroke, follow up repeat head CT to ensure no further strokes prior to starting [facility] for a fib/rvr EXAM: CT HEAD WITHOUT CONTRAST TECHNIQUE: Contiguous axial images were obtained from the base of the skull through the vertex without intravenous contrast. RADIATION DOSE REDUCTION: This exam was performed according to the departmental dose-optimization program which includes automated exposure control, adjustment of the mA and/or kV according to patient size and/or use of iterative reconstruction technique. COMPARISON:  12/06/2023 FINDINGS: Brain: Previously seen small acute to subacute infarcts on prior MRI not appreciable by CT. No acute intracranial abnormality. Specifically, no hemorrhage, hydrocephalus, mass lesion, acute infarction, or significant intracranial injury. Vascular: No hyperdense vessel or unexpected calcification. Skull: No acute calvarial abnormality. Sinuses/Orbits: No acute findings Other: None IMPRESSION: No acute intracranial abnormality. Electronically Signed   By: Janeece Mechanic M.D.   On: 12/27/2023 14:28    Scheduled Meds:  budesonide-glycopyrrolate-formoterol  2 puff Inhalation BID   citalopram   20 mg Oral q AM   diltiazem  30 mg Oral Q8H   furosemide  20 mg Intravenous Daily   heparin   5,000 Units Subcutaneous Q8H   loratadine   10 mg Oral Daily   losartan  25 mg Oral Q2200   sodium chloride  flush  3 mL  Intravenous Q12H   Continuous Infusions:  sodium chloride      azithromycin  Stopped (12/26/23 1757)   cefTRIAXone (ROCEPHIN)  IV 1 g (12/27/23 1707)   diltiazem (CARDIZEM) infusion 15 mg/hr (12/27/23 1642)   lactated ringers  100 mL/hr at 12/27/23 1456     LOS: 1 day   35 minutes with more than 50% spent in reviewing records, counseling patient/family and coordinating care.  Trenton Frock, MD [facility] [email or web address] 12/27/2023, 5:11 PM

## 2023-12-28 ENCOUNTER — Telehealth: Payer: Self-pay | Admitting: Pharmacy Technician

## 2023-12-28 ENCOUNTER — Other Ambulatory Visit (HOSPITAL_COMMUNITY): Payer: Self-pay

## 2023-12-28 ENCOUNTER — Ambulatory Visit

## 2023-12-28 ENCOUNTER — Encounter: Payer: Self-pay | Admitting: *Deleted

## 2023-12-28 ENCOUNTER — Other Ambulatory Visit: Payer: Self-pay

## 2023-12-28 ENCOUNTER — Encounter (HOSPITAL_COMMUNITY): Payer: Self-pay | Admitting: Internal Medicine

## 2023-12-28 ENCOUNTER — Inpatient Hospital Stay (HOSPITAL_COMMUNITY)

## 2023-12-28 ENCOUNTER — Telehealth: Payer: Self-pay

## 2023-12-28 ENCOUNTER — Other Ambulatory Visit: Payer: Self-pay | Admitting: Oncology

## 2023-12-28 DIAGNOSIS — I4891 Unspecified atrial fibrillation: Secondary | ICD-10-CM

## 2023-12-28 DIAGNOSIS — I480 Paroxysmal atrial fibrillation: Secondary | ICD-10-CM

## 2023-12-28 DIAGNOSIS — R0609 Other forms of dyspnea: Secondary | ICD-10-CM | POA: Diagnosis not present

## 2023-12-28 DIAGNOSIS — I3139 Other pericardial effusion (noninflammatory): Secondary | ICD-10-CM | POA: Diagnosis not present

## 2023-12-28 DIAGNOSIS — Z87891 Personal history of nicotine dependence: Secondary | ICD-10-CM | POA: Diagnosis not present

## 2023-12-28 DIAGNOSIS — I4819 Other persistent atrial fibrillation: Secondary | ICD-10-CM

## 2023-12-28 DIAGNOSIS — C3491 Malignant neoplasm of unspecified part of right bronchus or lung: Secondary | ICD-10-CM

## 2023-12-28 LAB — BASIC METABOLIC PANEL WITH GFR
Anion gap: 12 (ref 5–15)
BUN: 26 mg/dL — ABNORMAL HIGH (ref 8–23)
CO2: 26 mmol/L (ref 22–32)
Calcium: 9.4 mg/dL (ref 8.9–10.3)
Chloride: 103 mmol/L (ref 98–111)
Creatinine, Ser: 1.05 mg/dL (ref 0.61–1.24)
GFR, Estimated: >60 mL/min (ref >60)
Glucose, Bld: 118 mg/dL — ABNORMAL HIGH (ref 70–99)
Potassium: 3.5 mmol/L (ref 3.5–5.1)
Sodium: 141 mmol/L (ref 135–145)

## 2023-12-28 LAB — CBC
HCT: 41.3 % (ref 39.0–52.0)
Hemoglobin: 13.8 g/dL (ref 13.0–17.0)
MCH: 30.7 pg (ref 26.0–34.0)
MCHC: 33.4 g/dL (ref 30.0–36.0)
MCV: 91.8 fL (ref 80.0–100.0)
Platelets: 181 10*3/uL (ref 150–400)
RBC: 4.5 MIL/uL (ref 4.22–5.81)
RDW: 12.8 % (ref 11.5–15.5)
WBC: 13.2 10*3/uL — ABNORMAL HIGH (ref 4.0–10.5)
nRBC: 0 % (ref 0.0–0.2)

## 2023-12-28 LAB — MAGNESIUM: Magnesium: 1.9 mg/dL (ref 1.7–2.4)

## 2023-12-28 LAB — PROTEIN, PLEURAL OR PERITONEAL FLUID: Total protein, fluid: 3.4 g/dL

## 2023-12-28 LAB — BODY FLUID CELL COUNT WITH DIFFERENTIAL
Eos, Fluid: 0 %
Lymphs, Fluid: 73 %
Monocyte-Macrophage-Serous Fluid: 1 % — ABNORMAL LOW (ref 50–90)
Neutrophil Count, Fluid: 26 % — ABNORMAL HIGH (ref 0–25)
Total Nucleated Cell Count, Fluid: 698 uL (ref 0–1000)

## 2023-12-28 LAB — LACTATE DEHYDROGENASE, PLEURAL OR PERITONEAL FLUID: LD, Fluid: 146 U/L — ABNORMAL HIGH (ref 3–23)

## 2023-12-28 LAB — HEPARIN LEVEL (UNFRACTIONATED)
Heparin Unfractionated: 0.21 [IU]/mL — ABNORMAL LOW (ref 0.30–0.70)
Heparin Unfractionated: 0.3 [IU]/mL (ref 0.30–0.70)
Heparin Unfractionated: 0.32 [IU]/mL (ref 0.30–0.70)

## 2023-12-28 MED ORDER — ARFORMOTEROL TARTRATE 15 MCG/2ML IN NEBU
15.0000 ug | INHALATION_SOLUTION | Freq: Two times a day (BID) | RESPIRATORY_TRACT | Status: DC
  Administered 2023-12-29 – 2024-01-02 (×9): 15 ug via RESPIRATORY_TRACT
  Filled 2023-12-28 (×10): qty 2

## 2023-12-28 MED ORDER — DABRAFENIB MESYLATE 50 MG PO CAPS: 150.0000 mg | ORAL_CAPSULE | Freq: Two times a day (BID) | ORAL | 0 refills | Status: DC

## 2023-12-28 MED ORDER — POTASSIUM CHLORIDE CRYS ER 20 MEQ PO TBCR
40.0000 meq | EXTENDED_RELEASE_TABLET | Freq: Once | ORAL | Status: AC
  Administered 2023-12-28: 40 meq via ORAL
  Filled 2023-12-28: qty 2

## 2023-12-28 MED ORDER — ENCORAFENIB 75 MG PO CAPS: 450.0000 mg | ORAL_CAPSULE | Freq: Every day | ORAL | Status: DC

## 2023-12-28 MED ORDER — TRAMETINIB DIMETHYL SULFOXIDE 2 MG PO TABS: 2.0000 mg | ORAL_TABLET | Freq: Every day | ORAL | 0 refills | Status: DC

## 2023-12-28 MED ORDER — LIDOCAINE HCL 1 % IJ SOLN
INTRAMUSCULAR | Status: AC
  Filled 2023-12-28: qty 20

## 2023-12-28 MED ORDER — METOPROLOL TARTRATE 25 MG PO TABS
25.0000 mg | ORAL_TABLET | Freq: Three times a day (TID) | ORAL | Status: DC
  Administered 2023-12-28 – 2023-12-29 (×3): 25 mg via ORAL
  Filled 2023-12-28 (×3): qty 1

## 2023-12-28 MED ORDER — REVEFENACIN 175 MCG/3ML IN SOLN
175.0000 ug | Freq: Every day | RESPIRATORY_TRACT | Status: DC
  Administered 2023-12-29 – 2024-01-02 (×5): 175 ug via RESPIRATORY_TRACT
  Filled 2023-12-28 (×6): qty 3

## 2023-12-28 MED ORDER — BINIMETINIB 15 MG PO TABS
45.0000 mg | ORAL_TABLET | Freq: Two times a day (BID) | ORAL | Status: DC
  Administered 2023-12-28 – 2024-01-02 (×10): 45 mg via ORAL
  Filled 2023-12-28 (×12): qty 3

## 2023-12-28 MED ORDER — ENCORAFENIB 75 MG PO CAPS
450.0000 mg | ORAL_CAPSULE | Freq: Every day | ORAL | Status: DC
  Administered 2023-12-28 – 2024-01-01 (×5): 450 mg via ORAL
  Filled 2023-12-28 (×5): qty 6

## 2023-12-28 MED ORDER — DILTIAZEM HCL 60 MG PO TABS
60.0000 mg | ORAL_TABLET | Freq: Three times a day (TID) | ORAL | Status: DC
  Administered 2023-12-28: 60 mg via ORAL
  Filled 2023-12-28: qty 1

## 2023-12-28 MED ORDER — BUDESONIDE 0.25 MG/2ML IN SUSP
0.2500 mg | Freq: Two times a day (BID) | RESPIRATORY_TRACT | Status: DC
  Administered 2023-12-29 – 2024-01-02 (×9): 0.25 mg via RESPIRATORY_TRACT
  Filled 2023-12-28 (×10): qty 2

## 2023-12-28 NOTE — TOC Initial Note (Signed)
 Transition of Care [facility]) - Initial/Assessment Note    Patient Details  Name: Jose Joseph MRN: [id number] Date of Birth: January 29, 1956  Transition of [facility]) CM/SW Contact:    Juliane Och, LCSW Phone Number: 12/28/2023, 11:02 AM  Clinical Narrative:                  11:02 AM CSW introduced self and role to patient at patient's bedside. Patient stated he resides with his spouse and adult son. Patient stated that his spouse could provide transportation and home support if needed upon discharge. Patient denied SNF/HH/DME history. Per chart review, patient has a PCP and insurance. No TOC needs were identified at this time. TOC will continue to follow and be available to assist.   Expected Discharge Plan: Home/Self Care Barriers to Discharge: Continued Medical Work up   Patient Goals and CMS Choice Patient states their goals for this hospitalization and ongoing recovery are:: to return home          Expected Discharge Plan and Services       Living arrangements for the past 2 months: Single Family Home                                      Prior Living Arrangements/Services Living arrangements for the past 2 months: Single Family Home Lives with:: Spouse, Adult Children Patient language and need for interpreter reviewed:: Yes Do you feel safe going back to the place where you live?: Yes      Need for Family Participation in Patient Care: No (Comment) Care giver support system in place?: Yes (comment)   Criminal Activity/Legal Involvement Pertinent to Current Situation/Hospitalization: No - Comment as needed  Activities of Daily Living   ADL Screening (condition at time of admission) Independently performs ADLs?: Yes (appropriate for developmental age) Is the patient deaf or have difficulty hearing?: No Does the patient have difficulty seeing, even when wearing glasses/contacts?: No Does the patient have difficulty concentrating, remembering, or making  decisions?: No  Permission Sought/Granted Permission sought to share information with : Family Supports Permission granted to share information with : No (Contact information on chart)  Share Information with NAME: Camerron Garnier     Permission granted to share info w Relationship: Spouse  Permission granted to share info w Contact Information: [phone]  Emotional Assessment Appearance:: Appears stated age Attitude/Demeanor/Rapport: Engaged Affect (typically observed): Accepting, Adaptable, Appropriate, Calm, Pleasant, Stable Orientation: : Oriented to Self, Oriented to Place, Oriented to  Time, Oriented to Situation Alcohol / Substance Use: Not Applicable Psych Involvement: No (comment)  Admission diagnosis:  Pericardial effusion [I31.39] Malignant neoplasm of lung, unspecified laterality, unspecified part of lung (HCC) [C34.90] Patient Active Problem List   Diagnosis Date Noted   Atrial fibrillation with rapid ventricular response (HCC) 12/27/2023   Malignant neoplasm of lung (HCC) 12/27/2023   Dyspnea on exertion 12/26/2023   Pericardial effusion 12/26/2023   Hypertensive urgency 12/26/2023   Lung cancer metastatic to bone (HCC) 12/26/2023   Former smoker 12/26/2023   History of stroke 12/26/2023   Chronic obstructive pulmonary disease (HCC) 12/26/2023   COPD with acute exacerbation (HCC) 12/15/2023   Malignant neoplasm of upper lobe of right lung (HCC) 11/30/2023   Pulmonary nodules 11/21/2023   Mediastinal adenopathy 11/21/2023   Status post total replacement of left hip 01/15/2021   Elevated PSA 04/10/2020   Pain in left hip 02/26/2020  Pain in right shoulder 02/26/2020   PCP:  Artemisa Bile, MD Pharmacy:   [facility] [phone] - [place], [place] - [address] AT [facility] OF [address] [place] [address] Phone: [phone] Fax: [phone]     Social Drivers of Health (SDOH) Social History: SDOH Screenings   Food  Insecurity: No Food Insecurity (12/27/2023)  Housing: Low Risk  (12/27/2023)  Transportation Needs: No Transportation Needs (12/27/2023)  Utilities: Not At Risk (12/27/2023)  Depression (PHQ2-9): Low Risk  (12/12/2023)  Social Connections: Patient Declined (12/27/2023)  Tobacco Use: Medium Risk (12/26/2023)   SDOH Interventions:     Readmission Risk Interventions     No data to display

## 2023-12-28 NOTE — Progress Notes (Signed)
 PATIENT NAVIGATOR PROGRESS NOTE  Name: Jose Joseph Date: 12/28/2023 MRN: [id number]  DOB: 1955-09-05   Reason for visit: Begin encorafenib /binimetinib  as soon as possible, we will try to arrange for delivery of these medications to his hospital room   Comments:  spoke with oral chemo pharmacist and medications due to be delivered today to pt home.  Spoke with pt and his wife and they will bring meds to hospital and talk with pharmacist to help facilitate administration    Time spent counseling/coordinating care: 30-45 minutes

## 2023-12-28 NOTE — Radiation Completion Notes (Signed)
 Patient Name: Jose Joseph, Jose Joseph MRN: [id number] Date of Birth: Oct 16, 1955 Referring Physician: Racheal Buddle, M.D. Date of Service: 2023-12-28 Radiation Oncologist: Johna Myers, M.D. [facility] [place] [facility]                             RADIATION ONCOLOGY END OF TREATMENT NOTE     Diagnosis: C79.51 Secondary malignant neoplasm of bone Staging on 2023-11-30: Malignant neoplasm of upper lobe of right lung (HCC) T=cT4, N=cN3, M=cM1 Intent: Palliative     ==========DELIVERED PLANS==========  First Treatment Date: 2023-12-15 Last Treatment Date: 2023-12-26   Plan Name: Lung_R Site: Lung, Right Technique: 3D Mode: Photon Dose Per Fraction: 3 Gy Prescribed Dose (Delivered / Prescribed): 21 Gy / 30 Gy Prescribed Fxs (Delivered / Prescribed): 7 / 10   Plan Name: Chest_L_rib6 Site: Ribs, Left Technique: Isodose Plan Mode: Photon Dose Per Fraction: 3 Gy Prescribed Dose (Delivered / Prescribed): 21 Gy / 30 Gy Prescribed Fxs (Delivered / Prescribed): 7 / 10     ==========ON TREATMENT VISIT DATES========== 2023-12-22     ==========UPCOMING VISITS==========       ==========APPENDIX - ON TREATMENT VISIT NOTES==========   See weekly On Treatment Notes in Epic for details in the Media tab (listed as Progress notes on the On Treatment Visit Dates listed above).

## 2023-12-28 NOTE — Progress Notes (Signed)
 IP PROGRESS NOTE  Subjective:   Jose Joseph continues to have dyspnea and orthopnea.  He was sitting on the side of the bed when I saw him at approximately 6:30 AM.  Objective: Vital signs in last 24 hours: Blood pressure (!) 96/59, pulse 84, temperature (!) 97.5 F (36.4 C), temperature source Oral, resp. rate 19, height 6\' 1"  (1.854 m), weight 256 lb 13.4 oz (116.5 kg), SpO2 92%.  Intake/Output from previous day: 05/14 0701 - 05/15 0700 In: 4930.1 [P.O.:500; I.V.:3585.8; IV Piggyback:844.3] Out: 975 [Urine:975]  Physical Exam:   Lungs: Distant breath sounds Cardiac: Tachycardia, irregular    Portacath/PICC-without erythema  Lab Results: Recent Labs    12/27/23 1809 12/28/23 0159  WBC 15.0* 13.2*  HGB 14.4 13.8  HCT 42.6 41.3  PLT 179 181    BMET Recent Labs    12/27/23 1809 12/28/23 0159  NA 137 141  K 3.4* 3.5  CL 101 103  CO2 25 26  GLUCOSE 115* 118*  BUN 16 26*  CREATININE 1.02 1.05  CALCIUM 9.2 9.4    No results found for: "CEA1", "CEA", "AVW098", "CA125"  Studies/Results: CT HEAD WO CONTRAST ( ) Result Date: 12/27/2023 CLINICAL DATA:  Stroke, follow up repeat head CT to ensure no further strokes prior to starting [facility] for a fib/rvr EXAM: CT HEAD WITHOUT CONTRAST TECHNIQUE: Contiguous axial images were obtained from the base of the skull through the vertex without intravenous contrast. RADIATION DOSE REDUCTION: This exam was performed according to the departmental dose-optimization program which includes automated exposure control, adjustment of the mA and/or kV according to patient size and/or use of iterative reconstruction technique. COMPARISON:  12/06/2023 FINDINGS: Brain: Previously seen small acute to subacute infarcts on prior MRI not appreciable by CT. No acute intracranial abnormality. Specifically, no hemorrhage, hydrocephalus, mass lesion, acute infarction, or significant intracranial injury. Vascular: No hyperdense vessel or unexpected  calcification. Skull: No acute calvarial abnormality. Sinuses/Orbits: No acute findings Other: None IMPRESSION: No acute intracranial abnormality. Electronically Signed   By: Janeece Mechanic M.D.   On: 12/27/2023 14:28   CT Angio Chest Pulmonary Embolism (PE) W or WO Contrast Result Date: 12/26/2023 CLINICAL DATA:  Pulmonary embolism (PE) suspected, high prob EXAM: CT ANGIOGRAPHY CHEST WITH CONTRAST TECHNIQUE: Multidetector CT imaging of the chest was performed using the standard protocol during bolus administration of intravenous contrast. Multiplanar CT image reconstructions and MIPs were obtained to evaluate the vascular anatomy. RADIATION DOSE REDUCTION: This exam was performed according to the departmental dose-optimization program which includes automated exposure control, adjustment of the mA and/or kV according to patient size and/or use of iterative reconstruction technique. CONTRAST:  75mL OMNIPAQUE IOHEXOL 350 MG/ML SOLN COMPARISON:  Dec 26, 2023, November 17, 2023 FINDINGS: Pulmonary Embolism: No pulmonary embolism. Cardiovascular: No cardiomegaly. Moderate volume pericardial effusion. No aortic aneurysm. Mediastinum/Nodes: Enlarged left supraclavicular lymph node measuring 2.5 x 3.7 cm (axial 7). Multiple mildly enlarged right supraclavicular lymph nodes also present measuring up to 1.1 cm (axial 5). Significant enlargement of conglomerate lymph nodes along the left mediastinum/AP window, measuring 5.6 x 3.6 cm (axial 51), previously measuring 3.8 x 2.1 cm. Upper left internal mammary lymph nodes have also increased in size in the interim measuring 4.3 by 4 cm (previously 2.2 x 2.5 cm). Increased fullness of the inferior left internal mammary lymph nodes also present. Significant progression of the bilateral hilar lymphadenopathy. For example, anterior right hilar lymph node measures 2.3 cm in short axis dimension (previously 1.7 cm). Lungs/Pleura: The trachea is midline  and patent. Diffuse bronchial wall  thickening. Patchy perihilar ground-glass airspace opacities in the right upper and right lower lobes and the left upper lobe, lingula, and left lower lobe. Similar size of the spiculated mass in the perihilar right upper lobe, measuring 2.7 x 2.9 cm. Worsening consolidation of the right middle lobe. Overall, similar size and number of scattered pulmonary metastases throughout both lungs. For example, the largest nodule in the superior segment of the right lower lobe measures 1.6 cm (previously 1.5 cm). No pneumothorax. Small right and small to moderate left pleural effusions. Musculoskeletal: Bony cortical destruction in the anterolateral right first rib (axial 18). Bony cortical destruction of the posterolateral left sixth rib with overlying periosteal reaction. Enlarging destructive lesion in the inferior sternum. Upper Abdomen: 1.1 cm nodule in the right adrenal gland. Review of the MIP images confirms the above findings. IMPRESSION: 1. No pulmonary embolism. Diffuse bronchial wall thickening with perihilar ground-glass airspace opacities in both lungs. This could represent changes of developing multifocal pneumonia versus early pulmonary edema. Small right and small to moderate left pleural effusions. 2. Worsening metastatic lymphadenopathy in the lower neck and chest, as delineated above. 3. Similar size and number of scattered bilateral pulmonary metastases, the largest in the right lower lobe measures 1.6 cm (previously 1.5 cm). 4. Progressive bony metastatic disease, now involving the right anterolateral first rib. increased size of the left posterolateral sixth rib and inferior sternal body lesions. 5. Interval development of a small nodule in the right adrenal gland, measuring 1.1 cm, worrisome for new metastatic disease. Electronically Signed   By: Rance Burrows M.D.   On: 12/26/2023 14:18   DG Chest 2 View Result Date: 12/26/2023 CLINICAL DATA:  Shortness of breath.  Known malignancy EXAM: CHEST -  2 VIEW COMPARISON:  X-ray 12/15/2023 FINDINGS: Stable right IJ chest port. Tip along the central SVC. Stable cardiopericardial silhouette with tortuous aorta ectatic aorta. Underinflated x-rays which are under penetrated. Bronchovascular crowding. The bandlike opacity right perihilar has improved from previous. Slightly more opacity at the medial right lung base however. No pneumothorax or effusion. Overlapping cardiac leads. IMPRESSION: Changing opacity. Improving right mid lung perihilar. Increasing right lung base. Recommend continued follow-up. Decreasing left lung base opacity. Electronically Signed   By: Adrianna Horde M.D.   On: 12/26/2023 12:29   ECHOCARDIOGRAM COMPLETE Result Date: 12/26/2023    ECHOCARDIOGRAM REPORT   Patient Name:   Jose Joseph Date of Exam: 12/26/2023 Medical Rec #:  [id number]     Height:       73.0 in Accession #:    [id number]    Weight:       266.0 lb Date of Birth:  07-07-1956     BSA:          2.429 m Patient Age:    67 years      BP:           134/77 mmHg Patient Gender: M             HR:           114 bpm. Exam Location:  Inpatient Procedure: 2D Echo, Cardiac Doppler and Color Doppler (Both Spectral and Color            Flow Doppler were utilized during procedure). Indications:    Chemo Z09  History:        Patient has no prior history of Echocardiogram examinations.  Sonographer:    Astrid Blamer Referring Phys: [phone] Paulett Boros IMPRESSIONS  1. Left ventricular ejection fraction, by estimation, is 55 to 60%. The left ventricle has normal function. The left ventricle has no regional wall motion abnormalities. Left ventricular diastolic parameters are consistent with Grade I diastolic dysfunction (impaired relaxation).  2. Right ventricular systolic function is normal. The right ventricular size is normal.  3. Large pericardial effusion. The pericardial effusion is circumferential. There is no evidence of cardiac tamponade.  4. The mitral valve is normal in structure.  Trivial mitral valve regurgitation. No evidence of mitral stenosis.  5. The aortic valve is tricuspid. There is mild calcification of the aortic valve. Aortic valve regurgitation is not visualized. Aortic valve sclerosis/calcification is present, without any evidence of aortic stenosis.  6. The inferior vena cava is dilated in size with >50% respiratory variability, suggesting right atrial pressure of 8 mmHg. Conclusion(s)/Recommendation(s): There is a moderate to large circumfrential pericardial effusion without over tamponade. Will contact patient for cardiology follow-up. FINDINGS  Left Ventricle: Left ventricular ejection fraction, by estimation, is 55 to 60%. The left ventricle has normal function. The left ventricle has no regional wall motion abnormalities. The left ventricular internal cavity size was normal in size. There is  no left ventricular hypertrophy. Left ventricular diastolic parameters are consistent with Grade I diastolic dysfunction (impaired relaxation). Right Ventricle: The right ventricular size is normal. No increase in right ventricular wall thickness. Right ventricular systolic function is normal. Left Atrium: Left atrial size was normal in size. Right Atrium: Right atrial size was normal in size. Pericardium: A large pericardial effusion is present. The pericardial effusion is circumferential. There is no evidence of cardiac tamponade. Mitral Valve: The mitral valve is normal in structure. Trivial mitral valve regurgitation. No evidence of mitral valve stenosis. Tricuspid Valve: The tricuspid valve is normal in structure. Tricuspid valve regurgitation is not demonstrated. No evidence of tricuspid stenosis. Aortic Valve: The aortic valve is tricuspid. There is mild calcification of the aortic valve. Aortic valve regurgitation is not visualized. Aortic valve sclerosis/calcification is present, without any evidence of aortic stenosis. Aortic valve mean gradient measures 3.0 mmHg. Aortic  valve peak gradient measures 4.7 mmHg. Aortic valve area, by VTI measures 2.53 cm. Pulmonic Valve: The pulmonic valve was not well visualized. Pulmonic valve regurgitation is not visualized. No evidence of pulmonic stenosis. Aorta: The aortic root is normal in size and structure. Venous: The inferior vena cava is dilated in size with greater than 50% respiratory variability, suggesting right atrial pressure of 8 mmHg. IAS/Shunts: No atrial level shunt detected by color flow Doppler.  LEFT VENTRICLE PLAX 2D LVIDd:         3.70 cm LVIDs:         2.70 cm LV PW:         1.00 cm LV IVS:        0.80 cm LVOT diam:     2.00 cm LV SV:         44 LV SV Index:   18 LVOT Area:     3.14 cm  RIGHT VENTRICLE RV S prime:     19.10 cm/s TAPSE (M-mode): 2.4 cm LEFT ATRIUM             Index        RIGHT ATRIUM           Index LA Vol (A2C):   31.7 ml 13.05 ml/m  RA Area:     17.00 cm LA Vol (A4C):   26.3 ml 10.83 ml/m  RA Volume:  49.70 ml  20.46 ml/m LA Biplane Vol: 28.9 ml 11.90 ml/m  AORTIC VALVE AV Area (Vmax):    2.45 cm AV Area (Vmean):   2.41 cm AV Area (VTI):     2.53 cm AV Vmax:           108.00 cm/s AV Vmean:          80.800 cm/s AV VTI:            0.175 m AV Peak Grad:      4.7 mmHg AV Mean Grad:      3.0 mmHg LVOT Vmax:         84.20 cm/s LVOT Vmean:        62.100 cm/s LVOT VTI:          0.141 m LVOT/AV VTI ratio: 0.81  AORTA Ao Root diam: 3.20 cm  SHUNTS Systemic VTI:  0.14 m Systemic Diam: 2.00 cm Jules Oar MD Electronically signed by Jules Oar MD Signature Date/Time: 12/26/2023/10:24:16 AM    Final     Medications: I have reviewed the patient's current medications.  Assessment/Plan:  Stage IV non-small cell lung cancer 10/19/2023 PET-bilateral lung nodules, dominant right upper lobe spiculated lesion, by lateral hilar and mediastinal lymphadenopathy, right supraclavicular lymphadenopathy, hypermetabolic bone lesions at the sternum, left sixth rib, and right iliac Bronchoscopy/biopsy  11/21/2023-right upper lobe nodule and level 4R/11R lymph nodes-adenocarcinoma, BRAF V600 E, PD-L1 TPS 95% 12/06/2023: MRI brain-small foci of diffusion signal abnormality without corresponding enhancement, likely acute/subacute infarcts Scheduled to begin encorafenib  and binimetinib  01/01/2024 Palliative radiation to the lung, sternum, and ribs beginning 12/15/2023 CT chest 12/26/2023: Enlarged left supraclavicular node, significant enlargement of lymph nodes at the left mediastinum/AP window, left internal mammary nodes and bilateral hilar nodes, groundglass airspace disease in the bilateral lungs, worsening right middle lobe consolidation, progressive bone metastases, new right adrenal nodule, small right and small to moderate left pleural effusion, moderate pericardial effusion   2.  Circumferential pericardial effusion on echocardiogram 12/26/2023 3.  Cough/dyspnea secondary to #1-probable components of effusions and airspace disease    Jose Joseph has metastatic non-small cell lung cancer.  He continues to have dyspnea and orthopnea.  He is scheduled for a thoracentesis today.   I recommend beginning systemic therapy as soon as possible.  I think it is unlikely he will gain significant benefit from further radiation.  We have contacted the Cancer center pharmacy.  The plan is to begin encorafenib  and binimetinib  today.  The medications are supposed to be delivered to his home today.  We we will ask his family to bring them to the hospital.  The dyspnea is likely multifactorial with atrial fibrillation, the pericardial effusion, COPD, and airspace disease contributing. Decision on draining the pericardial fluid per cardiology Recommendations: Management of atrial fibrillation per the medical and cardiology services Diagnostic/therapeutic thoracentesis Management of pericardial effusion per cardiology Continue antibiotics Begin encorafenib /binimetinib  as soon as possible, today if the medications  arrive   LOS: 2 days   Coni Deep, MD   12/28/2023, 7:05 AM

## 2023-12-28 NOTE — Consult Note (Cosign Needed)
 [address]       [place] [address]             [phone]        Orlinda [facility] Medical Record #[id number] Date of Birth: 03-27-1956  Referring: Olinda Bertrand Primary Care: Artemisa Bile, MD Primary Cardiologist:Sunit Albert Huff, DO  Chief Complaint:    Chief Complaint  Patient presents with   Shortness of Breath   History of Present Illness:      Jose Joseph is a 68 yo male with known history of COPD and Stage IV NSCLC.  This was recently diagnosed in January of this year.  He was traveling in [place] with his wife at which time he developed a cold.  CT scan was obtained as part of the workup and showed evidence of malignancy.  Upon return to [facility] he underwent workup by oncology which consisted of a biopsy which was performed on 11/21/2023 and showed adenocarcinoma.  He was found to have evidence of metastasis to his sternum and rib.  He is currently undergoing treatment with radiation to his bone mets.  Port was placed on 5/1 during which time he was noted to be hypertensive.  He suffered an exacerbation of COPD and required treatment with a medrol  dose pack and ABX.  Due to upcoming chemotherapy and new diagnosis of HTN, Echocardiogram was ordered.  This showed a large pericardial effusion without evidence of tamponade.  He was contacted and sent to the Emergency Department on 5/13.  Urgent cardiology consultation was requested and he was evaluated by Dr. Albert Huff at which time patient admitted to difficulty laying flat for several weeks.  He was tachycardic and tripoding on exam.  He was also noted to be hypertensive on exam.  It was felt patient would benefit from pericardiocentesis and admission to medicine.  It was also recommended the patient be transferred to [facility] for further care.  CTA of the chest revealed moderate size left pleural effusion.  He developed Atrial Fibrillation with RVR while in the ED and was treated with Cardizem drip and oral  Lopressor.  He was started on anticoagulation given history of stroke in the past.  [facility] the patient underwent Thoracentesis with removal of 600 cc of fluid from the left chest.  Interventional cardiology was consulted and imaging was reviewed by Dr. Addie Holstein who felt that due to location pericardial window would be the better option.  Currently, the patient's shortness of breath is improved since arrival.  He states he feels much better.  He remains on Heparin  drip for stroke prophylaxis.  He is a former smoker, having quit about 12 years ago.  He denies chest pain.  Past Medical History:  Diagnosis Date   Arthritis    Asthma    Atrial fibrillation (HCC)    Cancer (HCC)    Complication of anesthesia    after his neck surgery pt. trying to get up and walk out   COPD (chronic obstructive pulmonary disease) (HCC)    Depression    History of kidney stones    2021   Hypertension    Metastatic lung carcinoma, right (HCC) 08/2023   Stroke [facility])     Past Surgical History:  Procedure Laterality Date   BRONCHIAL BIOPSY  11/21/2023   Procedure: BRONCHOSCOPY, WITH BIOPSY;  Surgeon: Denson Flake, MD;  Location: [facility] ENDOSCOPY;  Service: Pulmonary;;   BRONCHIAL NEEDLE ASPIRATION BIOPSY  11/21/2023  Procedure: BRONCHOSCOPY, WITH NEEDLE ASPIRATION BIOPSY;  Surgeon: Denson Flake, MD;  Location: [facility] ENDOSCOPY;  Service: Pulmonary;;   COLONOSCOPY WITH PROPOFOL  N/A 01/19/2023   Procedure: COLONOSCOPY WITH PROPOFOL ;  Surgeon: Suzette Espy, MD;  Location: [facility] ENDO SUITE;  Service: Endoscopy;  Laterality: N/A;  200pm, asa 2   ENDOBRONCHIAL ULTRASOUND Bilateral 11/21/2023   Procedure: ENDOBRONCHIAL ULTRASOUND (EBUS);  Surgeon: Denson Flake, MD;  Location: [facility] ENDOSCOPY;  Service: Pulmonary;  Laterality: Bilateral;   HERNIA REPAIR     IR IMAGING GUIDED PORT INSERTION  12/14/2023   IR THORACENTESIS ASP PLEURAL SPACE W/IMG GUIDE  12/28/2023   KNEE ARTHROCENTESIS     NECK SURGERY      POLYPECTOMY  01/19/2023   Procedure: POLYPECTOMY;  Surgeon: Suzette Espy, MD;  Location: [facility] ENDO SUITE;  Service: Endoscopy;;   TOTAL HIP ARTHROPLASTY Left 01/15/2021   Procedure: LEFT TOTAL HIP ARTHROPLASTY ANTERIOR APPROACH;  Surgeon: Adah Acron, MD;  Location: [facility] OR;  Service: Orthopedics;  Laterality: Left;   VIDEO BRONCHOSCOPY WITH ENDOBRONCHIAL NAVIGATION Bilateral 11/21/2023   Procedure: VIDEO BRONCHOSCOPY WITH ENDOBRONCHIAL NAVIGATION;  Surgeon: Denson Flake, MD;  Location: [facility] ENDOSCOPY;  Service: Pulmonary;  Laterality: Bilateral;   WRIST ARTHROPLASTY      Social History   Tobacco Use  Smoking Status Former   Current packs/day: 0.00   Average packs/day: 1 pack/day for 35.0 years (35.0 ttl pk-yrs)   Types: Cigarettes   Start date: 81   Quit date: 2014   Years since quitting: 11.3   Passive exposure: Past  Smokeless Tobacco Never    Social History   Substance and Sexual Activity  Alcohol Use Not Currently   Alcohol/week: 2.0 standard drinks of alcohol   Types: 2 Cans of beer per week     Allergies  Allergen Reactions   Bee Venom Anaphylaxis    syncope    Current Facility-Administered Medications  Medication Dose Route Frequency Provider Last Rate Last Admin   0.9 %  sodium chloride  infusion  250 mL Intravenous PRN Rai, Ripudeep K, MD       0.9 %  sodium chloride  infusion   Intravenous Continuous Patel, Pranav M, MD 75 mL/hr at 12/28/23 0905 Rate Change at 12/28/23 1478   acetaminophen  (TYLENOL ) tablet 650 mg  650 mg Oral Q6H PRN Rai, Ripudeep K, MD   650 mg at 12/28/23 2956   Or   acetaminophen  (TYLENOL ) suppository 650 mg  650 mg Rectal Q6H PRN Rai, Ripudeep K, MD       arformoterol (BROVANA) nebulizer solution 15 mcg  15 mcg Nebulization BID Patel, Pranav M, MD       azithromycin  (ZITHROMAX ) 500 mg in sodium chloride  0.9 % 250 mL IVPB  500 mg Intravenous Q24H Rai, Ripudeep K, MD   Stopped at 12/26/23 1757   benzonatate (TESSALON) capsule 100 mg  100  mg Oral TID Patel, Pranav M, MD   100 mg at 12/28/23 2130   binimetinib  (MEKTOVI ) tablet 45 mg  45 mg Oral BID Sherrill, Gary B, MD       budesonide (PULMICORT) nebulizer solution 0.25 mg  0.25 mg Nebulization BID Patel, Pranav M, MD       cefTRIAXone (ROCEPHIN) 1 g in sodium chloride  0.9 % 100 mL IVPB  1 g Intravenous Q24H Rai, Ripudeep K, MD 200 mL/hr at 12/28/23 0927 1 g at 12/28/23 8657   Chlorhexidine  Gluconate Cloth 2 % PADS 6 each  6 each Topical Daily Patel, Pranav M,  MD   6 each at 12/28/23 0925   citalopram  (CELEXA ) tablet 20 mg  20 mg Oral q AM Rai, Ripudeep K, MD   20 mg at 12/28/23 0921   diltiazem (CARDIZEM) 125 mg in dextrose  5% 125 mL (1 mg/mL) infusion  5-15 mg/hr Intravenous Titrated Daniels, James K, NP 5 mL/hr at 12/28/23 0800 5 mg/hr at 12/28/23 0800   diltiazem (CARDIZEM) tablet 60 mg  60 mg Oral Q8H Tolia, Sunit, DO       encorafenib  (BRAFTOVI ) capsule 450 mg  450 mg Oral Daily Sherrill, Gary B, MD       guaiFENesin (MUCINEX) 12 hr tablet 1,200 mg  1,200 mg Oral BID Patel, Pranav M, MD   1,200 mg at 12/28/23 0919   heparin  ADULT infusion 100 units/mL (25000 units/250mL)  1,550 Units/hr Intravenous Continuous Patel, Pranav M, MD 15.5 mL/hr at 12/28/23 0800 1,550 Units/hr at 12/28/23 0800   HYDROmorphone  (DILAUDID ) injection 0.5-1 mg  0.5-1 mg Intravenous Q2H PRN Rai, Ripudeep K, MD       levalbuterol (XOPENEX) nebulizer solution 0.63 mg  0.63 mg Nebulization Q6H PRN Patel, Pranav M, MD       loratadine  (CLARITIN ) tablet 10 mg  10 mg Oral Daily Rai, Ripudeep K, MD   10 mg at 12/28/23 0920   methylPREDNISolone  sodium succinate (SOLU-MEDROL ) 40 mg/mL injection 40 mg  40 mg Intravenous Daily Patel, Pranav M, MD   40 mg at 12/28/23 0913   ondansetron  (ZOFRAN ) tablet 4 mg  4 mg Oral Q6H PRN Rai, Ripudeep K, MD       Or   ondansetron  (ZOFRAN ) injection 4 mg  4 mg Intravenous Q6H PRN Rai, Ripudeep K, MD       oxyCODONE  (Oxy IR/ROXICODONE ) immediate release tablet 5 mg  5 mg Oral Q4H  PRN Rai, Ripudeep K, MD       polyethylene glycol (MIRALAX  / GLYCOLAX ) packet 17 g  17 g Oral Daily PRN Rai, Ripudeep K, MD       revefenacin (YUPELRI) nebulizer solution 175 mcg  175 mcg Nebulization Daily Kraig Peru, MD       sodium chloride  flush (NS) 0.9 % injection 3 mL  3 mL Intravenous Q12H Rai, Ripudeep K, MD   3 mL at 12/28/23 0920   sodium chloride  flush (NS) 0.9 % injection 3 mL  3 mL Intravenous PRN Rai, Ripudeep K, MD        Medications Prior to Admission  Medication Sig Dispense Refill Last Dose/Taking   albuterol  (VENTOLIN  HFA) 108 (90 Base) MCG/ACT inhaler Inhale 2 puffs into the lungs every 6 (six) hours as needed for wheezing or shortness of breath. 8 g 2 12/26/2023 Morning   amoxicillin -clavulanate (AUGMENTIN ) 875-125 MG tablet Take 1 tablet by mouth 2 (two) times daily. 14 tablet 0 12/25/2023 Bedtime   budeson-glycopyrrolate-formoterol (BREZTRI  AEROSPHERE) 160-9-4.8 MCG/ACT AERO inhaler Inhale 2 puffs into the lungs in the morning and at bedtime. 10.2 g 3 12/25/2023 Bedtime   cetirizine (ZYRTEC) 10 MG tablet Take 10 mg by mouth in the morning.   12/25/2023 Morning   citalopram  (CELEXA ) 20 MG tablet Take 20 mg by mouth in the morning.   12/25/2023 Morning   fexofenadine (ALLEGRA) 180 MG tablet Take 180 mg by mouth in the morning.   12/25/2023 Morning   Fluticasone-Umeclidin-Vilant (TRELEGY ELLIPTA ) 100-62.5-25 MCG/ACT AEPB Inhale 1 puff into the lungs daily. 1 each 6 12/26/2023 Morning   predniSONE  (DELTASONE ) 10 MG tablet Take 40mg  daily for 3 days,  then 30mg  daily for 3 days, then 20mg  daily for 3 days, then 10mg  daily for 3 days, then stop 30 tablet 0 12/25/2023 Evening   binimetinib  (MEKTOVI ) 15 MG tablet Take 3 tablets (45 mg total) by mouth 2 (two) times daily. 180 tablet 2    encorafenib  (BRAFTOVI ) 75 MG capsule Take 6 capsules (450 mg total) by mouth daily. 180 capsule 2    Fluticasone-Umeclidin-Vilant (TRELEGY ELLIPTA ) 100-62.5-25 MCG/ACT AEPB Inhale 1 puff into the lungs  daily at 2 PM.      prochlorperazine  (COMPAZINE ) 10 MG tablet Take 1 tablet (10 mg total) by mouth every 6 (six) hours as needed. (Patient not taking: Reported on 12/26/2023) 60 tablet 2 Not Taking    Family History  Problem Relation Age of Onset   Kidney cancer Sister    Prostate cancer Brother      Review of Systems:     Cardiac Review of Systems: Y or  [    ]= no  Chest Pain [ N   ]  Resting SOB [  Y, improved ] Exertional SOB  [  ]  Orthopnea [  ]   Pedal Edema [ Y  ]    Palpitations [  ] Syncope  [  ]   Presyncope [   ]  General Review of Systems: [Y] = yes [  ]=no Constitional: recent weight change [  ]; anorexia [  ]; fatigue [  ]; nausea [ N ]; night sweats [  ]; fever [  ]; or chills [  ]                                                               Dental: Last Dentist visit:   Eye : blurred vision [  ]; diplopia [   ]; vision changes [  ];  Amaurosis fugax[  ]; Resp: cough [ Y ];  wheezing[  ];  hemoptysis[  ]; shortness of breath[ Y ]; paroxysmal nocturnal dyspnea[ Y ]; dyspnea on exertion[  ]; or orthopnea[  ];  GI:  gallstones[  ], vomiting[N  ];  dysphagia[  ]; melena[  ];  hematochezia [  ]; heartburn[  ];   Hx of  Colonoscopy[  ]; GU: kidney stones [  ]; hematuria[  ];   dysuria [  ];  nocturia[  ];  history of     obstruction [  ]; urinary frequency [  ]             Skin: rash, swelling[  N];, hair loss[  ];  peripheral edema[  ];  or itching[  ]; Musculosketetal: myalgias[  ];  joint swelling[  ];  joint erythema[  ];  joint pain[  ];  back pain[  ];  Heme/Lymph: bruising[  ];  bleeding[  ];  anemia[  ];  Neuro: TIA[  ];  headaches[  ];  stroke[Y  ];  vertigo[  ];  seizures[  ];   paresthesias[  ];  difficulty walking[  ];  Psych:depression[  ]; anxiety[  ];  Endocrine: diabetes[ N ];  thyroid dysfunction[ N ];  Physical Exam: BP 100/64 (BP Location: Right Arm)   Pulse (!) 102   Temp 98.2 F (36.8 C) (Oral)   Resp 19  Ht 6\' 1"  (1.854 m)   Wt 116.5 kg   SpO2  97%   BMI 33.89 kg/m    General appearance: alert, cooperative, and tripoding Head: Normocephalic, without obvious abnormality, atraumatic Neck: no adenopathy, no carotid bruit, no JVD, supple, symmetrical, trachea midline, and thyroid not enlarged, symmetric, no tenderness/mass/nodules Resp: diminished breath sounds bibasilar Cardio: regular rate and rhythm GI: soft, non-tender; bowel sounds normal; no masses,  no organomegaly Extremities: edema pitting edema Neurologic: Grossly normal  Diagnostic Studies & Laboratory data:     Recent Radiology Findings:   DG Chest 1 View Result Date: 12/28/2023 CLINICAL DATA:  Status post thoracentesis. EXAM: CHEST  1 VIEW COMPARISON:  Dec 26, 2023. FINDINGS: Stable cardiomediastinal silhouette. Right internal jugular Port-A-Cath is unchanged. Stable right perihilar opacity is noted concerning for atelectasis and possible underlying neoplasm. No pneumothorax status post thoracentesis. Minimal left basilar subsegmental atelectasis. Bony thorax is unremarkable. IMPRESSION: Stable right perihilar opacity is noted concerning for atelectasis and possible underlying neoplasm. No pneumothorax status post thoracentesis. Electronically Signed   By: Rosalene Colon M.D.   On: 12/28/2023 08:59   IR THORACENTESIS ASP PLEURAL SPACE W/IMG GUIDE Result Date: 12/28/2023 INDICATION: 68 year old male with left pleural effusion for diagnostic and therapeutic thoracentesis. EXAM: ULTRASOUND GUIDED LEFT THORACENTESIS MEDICATIONS: 10 mL 1% lidocaine  COMPLICATIONS: None immediate. PROCEDURE: An ultrasound guided thoracentesis was thoroughly discussed with the patient and questions answered. The benefits, risks, alternatives and complications were also discussed. The patient understands and wishes to proceed with the procedure. Written consent was obtained. Ultrasound was performed to localize and mark an adequate pocket of fluid in the left chest. The area was then prepped and  draped in the normal sterile fashion. 1% Lidocaine  was used for local anesthesia. Under ultrasound guidance a 6 Fr Safe-T-Centesis catheter was introduced. Thoracentesis was performed. The catheter was removed and a dressing applied. FINDINGS: A total of approximately 600 mL of clear, yellow fluid was removed. Samples were sent to the laboratory as requested by the clinical team. IMPRESSION: Successful ultrasound guided left thoracentesis yielding 600 mL of pleural fluid. Performed By Lorinda Root, PA-C Electronically Signed   By: Erica Hau M.D.   On: 12/28/2023 08:46   CT HEAD WO CONTRAST ( ) Result Date: 12/27/2023 CLINICAL DATA:  Stroke, follow up repeat head CT to ensure no further strokes prior to starting [facility] for a fib/rvr EXAM: CT HEAD WITHOUT CONTRAST TECHNIQUE: Contiguous axial images were obtained from the base of the skull through the vertex without intravenous contrast. RADIATION DOSE REDUCTION: This exam was performed according to the departmental dose-optimization program which includes automated exposure control, adjustment of the mA and/or kV according to patient size and/or use of iterative reconstruction technique. COMPARISON:  12/06/2023 FINDINGS: Brain: Previously seen small acute to subacute infarcts on prior MRI not appreciable by CT. No acute intracranial abnormality. Specifically, no hemorrhage, hydrocephalus, mass lesion, acute infarction, or significant intracranial injury. Vascular: No hyperdense vessel or unexpected calcification. Skull: No acute calvarial abnormality. Sinuses/Orbits: No acute findings Other: None IMPRESSION: No acute intracranial abnormality. Electronically Signed   By: Janeece Mechanic M.D.   On: 12/27/2023 14:28   CT Angio Chest Pulmonary Embolism (PE) W or WO Contrast Result Date: 12/26/2023 CLINICAL DATA:  Pulmonary embolism (PE) suspected, high prob EXAM: CT ANGIOGRAPHY CHEST WITH CONTRAST TECHNIQUE: Multidetector CT imaging of the chest was performed  using the standard protocol during bolus administration of intravenous contrast. Multiplanar CT image reconstructions and MIPs were obtained to evaluate  the vascular anatomy. RADIATION DOSE REDUCTION: This exam was performed according to the departmental dose-optimization program which includes automated exposure control, adjustment of the mA and/or kV according to patient size and/or use of iterative reconstruction technique. CONTRAST:  75mL OMNIPAQUE IOHEXOL 350 MG/ML SOLN COMPARISON:  Dec 26, 2023, November 17, 2023 FINDINGS: Pulmonary Embolism: No pulmonary embolism. Cardiovascular: No cardiomegaly. Moderate volume pericardial effusion. No aortic aneurysm. Mediastinum/Nodes: Enlarged left supraclavicular lymph node measuring 2.5 x 3.7 cm (axial 7). Multiple mildly enlarged right supraclavicular lymph nodes also present measuring up to 1.1 cm (axial 5). Significant enlargement of conglomerate lymph nodes along the left mediastinum/AP window, measuring 5.6 x 3.6 cm (axial 51), previously measuring 3.8 x 2.1 cm. Upper left internal mammary lymph nodes have also increased in size in the interim measuring 4.3 by 4 cm (previously 2.2 x 2.5 cm). Increased fullness of the inferior left internal mammary lymph nodes also present. Significant progression of the bilateral hilar lymphadenopathy. For example, anterior right hilar lymph node measures 2.3 cm in short axis dimension (previously 1.7 cm). Lungs/Pleura: The trachea is midline and patent. Diffuse bronchial wall thickening. Patchy perihilar ground-glass airspace opacities in the right upper and right lower lobes and the left upper lobe, lingula, and left lower lobe. Similar size of the spiculated mass in the perihilar right upper lobe, measuring 2.7 x 2.9 cm. Worsening consolidation of the right middle lobe. Overall, similar size and number of scattered pulmonary metastases throughout both lungs. For example, the largest nodule in the superior segment of the right lower  lobe measures 1.6 cm (previously 1.5 cm). No pneumothorax. Small right and small to moderate left pleural effusions. Musculoskeletal: Bony cortical destruction in the anterolateral right first rib (axial 18). Bony cortical destruction of the posterolateral left sixth rib with overlying periosteal reaction. Enlarging destructive lesion in the inferior sternum. Upper Abdomen: 1.1 cm nodule in the right adrenal gland. Review of the MIP images confirms the above findings. IMPRESSION: 1. No pulmonary embolism. Diffuse bronchial wall thickening with perihilar ground-glass airspace opacities in both lungs. This could represent changes of developing multifocal pneumonia versus early pulmonary edema. Small right and small to moderate left pleural effusions. 2. Worsening metastatic lymphadenopathy in the lower neck and chest, as delineated above. 3. Similar size and number of scattered bilateral pulmonary metastases, the largest in the right lower lobe measures 1.6 cm (previously 1.5 cm). 4. Progressive bony metastatic disease, now involving the right anterolateral first rib. increased size of the left posterolateral sixth rib and inferior sternal body lesions. 5. Interval development of a small nodule in the right adrenal gland, measuring 1.1 cm, worrisome for new metastatic disease. Electronically Signed   By: Rance Burrows M.D.   On: 12/26/2023 14:18     I have independently reviewed the above radiologic studies and discussed with the patient   Recent Lab Findings: Lab Results  Component Value Date   WBC 13.2 (H) 12/28/2023   HGB 13.8 12/28/2023   HCT 41.3 12/28/2023   PLT 181 12/28/2023   GLUCOSE 118 (H) 12/28/2023   ALT 22 12/27/2023   AST 17 12/27/2023   NA 141 12/28/2023   K 3.5 12/28/2023   CL 103 12/28/2023   CREATININE 1.05 12/28/2023   BUN 26 (H) 12/28/2023   CO2 26 12/28/2023   TSH 1.552 12/27/2023   INR 1.1 12/27/2023   Assessment / Plan:      Stage IV NSCLC-new diagnosis.. radiation  for brain mets, chemo to start ASAP Moderate left Pleural  Effusion- S/P thoracentesis today with removal of 600 cc.. breathing improved Atrial Fibrillation- maintaining NSR currently, on Heparin  drip for stroke prophyalxis Large Pericardial Effusion- plan is to repeat Echocardiogram.. if no evidence of tamponade favor by cards is to manage conservatively... if tamponade requesting Pericardial Window Creation as Dr. Addie Holstein was consulted and feels this would be better approach over Pericardiocentesis   Plan: patient feels much better since admission.  Will await repeat Echocardiogram results.  Once obtained, Dr. Deloise Ferries can review and determine if surgery would be indicated and if patient is a candidate.    I  spent 20 minutes counseling the patient face to face.  Talin Rozeboom, PA-C 12/28/2023 12:30 PM    Agree with above. If patient is feeling better after drainage of both effusions, hopefully we will not need to do anything to his pericardium.  As stated before, it is not very impressive on cross-sectional imaging.  Will follow him symptomatically to determine if he needs surgery or not.  Harrell Ala Alice

## 2023-12-28 NOTE — Progress Notes (Addendum)
 Oral Oncology Pharmacist Encounter  Received notification that patient is to start binimetinib  and encorafenib  as soon as possible. Patient currently admitted and patients family will bring in both medications. Pharmacist at [facility] notified of medications and that patients family members will be bringing both medications. Both prescriptions were added to the inpatient orders per Dr. Scherrie Curt.   Inpatient medication orders include:  - binimetinib  45mg  PO BID - encorafenib  450mg  daily   Patient is currently on diltiazem until patient is more controlled but is being transitioned over to oral metoprolol. Discussed with inpatient pharmacist and patient transitioned from diltiazem to metoprolol due to drug interaction with the encorafenib .   Arcenia Scarbro, PharmD Hematology/Oncology Clinical Pharmacist [facility] [phone]

## 2023-12-28 NOTE — Plan of Care (Signed)

## 2023-12-28 NOTE — Progress Notes (Signed)
 PHARMACY -  Re: drug interactions  Patient with orders for new binimetinib  and encorafenib .  Encorafenib  has a major drug interaction with both Xarelto and Eliquis.  Would recommend Lovenox 1 mg /kg q 12 hrs injections  when ready to switch from IV heparin  after procedures, with consideration of alternative chemo regimen in the future.  Also binimetinib  has significant interaction with diltiazem - discussed with Dr. Albert Huff, will attempt to optimize Lopressor dosing instead.  [name], [name], BCCP Clinical Pharmacist  12/28/2023 2:15 PM   [facility] pharmacy phone numbers are listed on [email or web address]

## 2023-12-28 NOTE — Progress Notes (Signed)
 PHARMACY - ANTICOAGULATION Pharmacy Consult for heparin   Indication: atrial fibrillation Brief A/P: Heparin  level subtherapeutic Increase Heparin  rate  Allergies  Allergen Reactions   Bee Venom Anaphylaxis    syncope    Patient Measurements: Height: 6\' 1"  (185.4 cm) Weight: 116.5 kg (256 lb 13.4 oz) IBW/kg (Calculated) : 79.9 HEPARIN  DW (KG): 104.9  Vital Signs: Temp: 97.6 F (36.4 C) (05/14 2355) Temp Source: Oral (05/14 2355) BP: 96/59 (05/15 0000) Pulse Rate: 100 (05/15 0200)  Labs: Recent Labs    12/26/23 1314 12/26/23 2325 12/27/23 0408 12/27/23 1809 12/28/23 0159  HGB  --   --  14.0 14.4 13.8  HCT  --   --  41.8 42.6 41.3  PLT  --   --  173 179 181  APTT  --   --   --  29  --   LABPROT  --   --  14.5  --   --   INR  --   --  1.1  --   --   HEPARINUNFRC  --   --   --  <0.10* 0.21*  CREATININE  --  1.08 0.92 1.02  --   TROPONINIHS 57* 53* 48*  --   --     Estimated Creatinine Clearance: 91.4 mL/min (by C-G formula based on SCr of 1.02 mg/dL).   Assessment: 68 y.o. male with Afib for heparin   Goal of Therapy:  Heparin  level 0.3-0.7 units/ml Monitor platelets by anticoagulation protocol: Yes   Plan:  Increase Heparin  1550 units/hr Check heparin  level in 8 hours.  Claudine Cullens, PharmD, BCPS  12/28/2023 3:10 AM

## 2023-12-28 NOTE — Progress Notes (Signed)
 Rounding Note    Patient Name: Jose Joseph Date of Encounter: 12/28/2023  [facility] Cardiologist: Olinda Bertrand, DO  Chief Complaint:  Chief Complaint  Patient presents with   Shortness of Breath  Reason of consult: Pericardial effusion  Subjective   Status post thoracentesis. Shortness of breath is better compared to his presentation to the ED Still requiring nasal cannula oxygen. Remains on Cardizem drip and rate controlled A-fib No family at bedside  Inpatient Medications    Scheduled Meds:  arformoterol  15 mcg Nebulization BID   benzonatate  100 mg Oral TID   binimetinib   45 mg Oral BID   budesonide (PULMICORT) nebulizer solution  0.25 mg Nebulization BID   Chlorhexidine  Gluconate Cloth  6 each Topical Daily   citalopram   20 mg Oral q AM   diltiazem  60 mg Oral Q8H   encorafenib   450 mg Oral Daily   guaiFENesin  1,200 mg Oral BID   loratadine   10 mg Oral Daily   methylPREDNISolone  (SOLU-MEDROL ) injection  40 mg Intravenous Daily   revefenacin  175 mcg Nebulization Daily   sodium chloride  flush  3 mL Intravenous Q12H   Continuous Infusions:  sodium chloride      sodium chloride  75 mL/hr at 12/28/23 1200   azithromycin  Stopped (12/26/23 1757)   cefTRIAXone (ROCEPHIN)  IV Stopped (12/28/23 0957)   diltiazem (CARDIZEM) infusion 5 mg/hr (12/28/23 1200)   heparin  1,550 Units/hr (12/28/23 1200)   PRN Meds: sodium chloride , acetaminophen  **OR** acetaminophen , HYDROmorphone  (DILAUDID ) injection, levalbuterol, ondansetron  **OR** ondansetron  (ZOFRAN ) IV, oxyCODONE , polyethylene glycol, sodium chloride  flush   Vital Signs    Vitals:   12/28/23 0600 12/28/23 0736 12/28/23 0904 12/28/23 1128  BP:  (!) 115/90 115/69 100/64  Pulse: 84 90 (!) 104 (!) 102  Resp: 19 20 (!) 24 19  Temp:  97.6 F (36.4 C)  98.2 F (36.8 C)  TempSrc:  Oral  Oral  SpO2: 92% 92% 91% 93%  Weight:      Height:        Intake/Output Summary (Last 24 hours) at 12/28/2023  1237 Last data filed at 12/28/2023 1200 Gross per 24 hour  Intake 5349.05 ml  Output 1175 ml  Net 4174.05 ml      12/27/2023    4:33 PM 12/26/2023   10:20 AM 12/21/2023   10:13 AM  Last 3 Weights  Weight (lbs) 256 lb 13.4 oz 261 lb 266 lb  Weight (kg) 116.5 kg 118.389 kg 120.657 kg      Telemetry    A-fib with controlled ventricular rate- Personally Reviewed  ECG    12/27/2023 atrial fibrillation with rapid ventricular rate, 128 bpm, low voltage, ST-T changes, occasional PVCs- Personally Reviewed  Physical Exam   General: Age appropriate, hemodynamically stable, mild distress due to dyspnea HEENT: Moist membranes, trachea midline, JVP, port present Lungs: Decreased breath sounds bilaterally, no rales or rhonchi Heart: Irregularly, irregular, tachycardic, variable S1-S2, no murmurs rubs or gallops appreciated secondary to tachycardia Abdomen: Soft, nontender, nondistended, positive bowel sounds in all 4 quadrants Extremities: Cool to touch, trace pitting edema, no cyanosis or clubbing Neuro: Moves all 4 extremities, alert oriented x 4  Labs    High Sensitivity Troponin:   Recent Labs  Lab 12/26/23 1026 12/26/23 1314 12/26/23 2325 12/27/23 0408  TROPONINIHS 57* 57* 53* 48*     Chemistry Recent Labs  Lab 12/26/23 2325 12/27/23 0408 12/27/23 1809 12/28/23 0159  NA 140 138 137 141  K 3.1* 3.6  3.4* 3.5  CL 104 104 101 103  CO2 26 24 25 26   GLUCOSE 130* 110* 115* 118*  BUN 21 23 16  26*  CREATININE 1.08 0.92 1.02 1.05  CALCIUM 9.1 9.0 9.2 9.4  MG 1.9  --   --  1.9  PROT  --   --  6.6  --   ALBUMIN  --   --  3.1*  --   AST  --   --  17  --   ALT  --   --  22  --   ALKPHOS  --   --  57  --   BILITOT  --   --  0.9  --   GFRNONAA >60 >60 >60 >60  ANIONGAP 10 10 11 12     Lipids No results for input(s): "CHOL", "TRIG", "HDL", "LABVLDL", "LDLCALC", "CHOLHDL" in the last 168 hours.  Hematology Recent Labs  Lab 12/27/23 0408 12/27/23 1809 12/28/23 0159  WBC  13.1* 15.0* 13.2*  RBC 4.56 4.76 4.50  HGB 14.0 14.4 13.8  HCT 41.8 42.6 41.3  MCV 91.7 89.5 91.8  MCH 30.7 30.3 30.7  MCHC 33.5 33.8 33.4  RDW 12.8 12.8 12.8  PLT 173 179 181   Thyroid  Recent Labs  Lab 12/27/23 0408  TSH 1.552    BNP Recent Labs  Lab 12/27/23 0408  BNP 71.4    DDimer No results for input(s): "DDIMER" in the last 168 hours.   Radiology    MRI w/ and /wo contrast:  April 2025 1. Small foci of diffusion-weighted signal abnormality within the anterior right frontal lobe, right parietooccipital lobes, left parietal lobe and right cerebellar hemisphere (measuring up to 5 mm). There is no appreciable corresponding pathologic enhancement at these sites, and these likely reflect acute/subacute infarcts (involvement of multiple vascular territories concerning for an embolic process). However, a short-interval follow-up brain MRI is recommended in 6 weeks to ensure expected evolution and to exclude poorly enhancing metastases. 2. Background T2 FLAIR hyperintense signal changes within the cerebral white matter (moderate) and pons (mild), nonspecific but most often secondary to chronic small vessel ischemia. 3. 10 mm left maxillary sinus mucous retention cyst.  CTA PE Study 12/26/2023 1. No pulmonary embolism. Diffuse bronchial wall thickening with perihilar ground-glass airspace opacities in both lungs. This could represent changes of developing multifocal pneumonia versus early pulmonary edema. Small right and small to moderate left pleural effusions. 2. Worsening metastatic lymphadenopathy in the lower neck and chest, as delineated above. 3. Similar size and number of scattered bilateral pulmonary metastases, the largest in the right lower lobe measures 1.6 cm (previously 1.5 cm). 4. Progressive bony metastatic disease, now involving the right anterolateral first rib. increased size of the left posterolateral sixth rib and inferior sternal body lesions. 5.  Interval development of a small nodule in the right adrenal gland, measuring 1.1 cm, worrisome for new metastatic disease.  Cardiac Studies   Echo 12/25/23:  1. Left ventricular ejection fraction, by estimation, is 55 to 60%. The  left ventricle has normal function. The left ventricle has no regional  wall motion abnormalities. Left ventricular diastolic parameters are  consistent with Grade I diastolic  dysfunction (impaired relaxation).   2. Right ventricular systolic function is normal. The right ventricular  size is normal.   3. Large pericardial effusion. The pericardial effusion is  circumferential. There is no evidence of cardiac tamponade.   4. The mitral valve is normal in structure. Trivial mitral valve  regurgitation. No evidence  of mitral stenosis.   5. The aortic valve is tricuspid. There is mild calcification of the  aortic valve. Aortic valve regurgitation is not visualized. Aortic valve  sclerosis/calcification is present, without any evidence of aortic  stenosis.   6. The inferior vena cava is dilated in size with >50% respiratory  variability, suggesting right atrial pressure of 8 mmHg.  Patient Profile     68 y.o. male former smoker, COPD, stage IV lung cancer with bone mets, pericardial effusion, pleural effusion, newly discovered atrial fibrillation, documented history of strokes (MRI April 2025).  Assessment & Plan   Assessment and plan: Shortness of breath Improving since arrival to the ED Multifactorial: Worsening cancer burden, parenchymal disease, pericardial effusion, pleural effusion status post thoracentesis 12/28/2023, hypertensive urgency on arrival, and new onset of A-fib with RVR Management as discussed below  Pericardial effusion: Noted incidentally on an echocardiogram performed for elevated blood pressures. Reported to be a large pericardial effusion on independent review it appears to be moderate to large circumferential Reached out to  interventional cardiology when he presented to ED. Dr. Addie Holstein reviewed the images and felt that based on location pericardial window may be a better option. Reached out CT surgery and Dr. Deloise Ferries reviewed his CT while patient was at [facility] and felt he would benefit from thoracentesis and/or pericardiocentesis as opposed to pericardial window. Of note addressing the pericardial effusion was strictly for symptom control as he was quite dyspneic, oxygen requirement, patient favoring to sit up and tripod position. Dyspnea has somewhat improved s/p thoracentesis this morning and w/ better rate control strategy (I.e. when I walked in I saw him laying flat for the first time).  Will repeat echocardiogram 48hrs after starting IV heparin  (for his Afib).  If the repeat echo does not illustrate tamponade physiology or worsening effusion and symptomatically he is improving I would favor conservative management with regards to pericardial effusion w/ serial echocardiogram for follow up. I understand the technically difficulties of pericardial window given his parenchymal disease, worsening cancer burden, and known metastatic cancer. IF improving he is better off starting the tx for lung cancer as it may further improve his dyspnea. However if the effusion is worsening or there is hemodynamic compromise then addressing the pericardial effusion (for diagnostic and therapeutic purposes) during his hospitalization would be more imperative after discussing the options w/ patient and wife.   Newly discovered atrial fibrillation with controlled ventricular rate: Was in RVR earlier this admission  Rate control: Cardizem & Lopressor. Rhythm control: N/A. Thromboembolic prophylaxis: IV heparin  drip His recent MRI in April 2025 illustrates findings of acute and subacute strokes.  Neurology was reached out by attending physician at [facility] who okayed initiating anticoagulation with close monitoring. Given his chads  Vascor oral anticoagulation is ideally favored to prevent thromboembolic events.  Risks, benefits, and alternatives to oral anticoagulation were also discussed as part of shared decision making.  In addition, given the pericardial effusion I did voice my concerns that being on anticoagulation predisposes him to having hemorrhagic pericardial effusion which may lead to worsening morbidity or mortality.  Patient verbalized understanding and voiced that based on imaging he has had strokes already and would like to avoid additional reoccurrences.  He understands the risks of being on anticoagulation and agreed starting anticoagulation.  He echoed the same with attending physician at [facility] health. Currently on IV heparin  drip. Does not endorse evidence of bleeding We will repeat a limited echocardiogram in 48 hours to reevaluate pericardial  effusion and tamponade physiology.  If overall stable may transition to oral anticoagulation with outpatient monitoring.  If he has worsening pericardial effusion this will need to be reevaluated and addressed.  Ventricular rates are better controlled now compared to the last 24 hours. Currently on low-dose Cardizem and Lopressor orally.  To simplify medical therapy will discontinue Lopressor 12.5 mg p.o. every 6 hours and increase Cardizem from 30 mg p.o. every 8 to 60 mg p.o. every 8 with holding parameters.  Instructed RN to wean off Cardizem drip as tolerated based on hemodynamics.    Click Here to Calculate/Change CHADS2VASc Score The patient's CHADS2-VASc score is 5, indicating a 7.2% annual risk of stroke.    Stage IV lung cancer with bone mets: Oncology documentation reviewed.  Morning progress notes are pending.  But in summary recommending discontinuation of radiation therapy and starting encorafenib /binimetinib  during this hospitalization.    Pleural effusion: Status post left thoracentesis 12/28/2023 600 cc of clear/yellow fluid removed specimen sent to lab  according to the progress note  Hypertensive urgency: Resolved  Benign essential hypertension: Started on antihypertensive medications and currently on Cardizem drip likely the cause of soft blood pressures.  Was started on losartan 25 mg p.o. daily and Lasix 20 mg IV push daily.  Will discontinue losartan for now to prevent hypotension.  Monitor BP and renal function UDS positive for marijuana  COPD: Management per primary team  Former smoker  Plan of care discussed with the patient, primary team, and also reached out to CT surgery coordinator as part of today's encounter.  Plan of care also discussed with nursing staff.  Prescription drug management.  Coordination of care.  Telemetry independently reviewed along with labs from 12/28/2023.  Strongly encourage consulting palliative care to discuss goals of care given his stage IV lung cancer with bone metastases with pericardial effusion and pleural effusion and A-fib with RVR.  If other providers on the care team agree would advise attending physician to consider this as well if patient is agreeable.   For questions or updates, please contact [facility] Please consult [email or web address] for contact info under   Total time spent: 55 minutes.    Signed, Awilda Bogus, [facility]  [facility]  Pager: [phone] Office: [phone] 12/28/2023, 12:37 PM

## 2023-12-28 NOTE — Hospital Course (Addendum)
 Patient with PMH of multiple colon cancer with metastasis to sternum and ribs undergoing radiation therapy, COPD/asthma, depression, former smoker, obesity presented to the hospital with complaints of shortness of breath and abnormal echocardiogram. Shortness of breath specifically occurring on exertion ongoing for 2 weeks. Found to have large pericardial effusion without tamponade, A-fib with RVR and pleural effusion. Cardiology, medical oncology and IR were consulted. CT surgery also reviewed patient's echocardiogram. Currently plan is for conservative management.  Repeat echocardiogram on 5/17 shows much smaller pericardial effusion.  Now plan for DCCV 5/19  Assessment and Plan: Large pericardial effusion. Currently no tamponade physiology. Underwent outpatient echocardiogram before initiation of chemotherapy and with new onset hypertension and was found to have large circumferential pericardial effusion. Was recommended to go to ER for further workup. Seen by cardiology and CT surgery as well as interventional cardiology. Repeat echocardiogram on 5/17 shows pericardial effusion much smaller in size.  No further workup for the effusion.  New onset A-fib with RVR: After admission patient was found to be in RVR. No prior history. Currently on IV heparin  and IV Cardizem  as well as oral metoprolol  with as needed IV metoprolol . CHADVASC score is relatively high. Currently on IV heparin . Cardizem  and amiodarone will not be an option long-term given interaction with chemotherapy. DOACs will not be an option for long-term given interaction with his chemotherapy. Lovenox for stroke prevention once stable for transition. Will discuss with pharmacy if once a day regimen is appropriate for the patient. Patient's wife is an [employer] fundraiser who can provide the injections. Management per cardiology.   Bilateral small to moderate pleural effusion: Small to moderate effusion noted on CT of chest. IR performed  left pleural effusion thoracentesis.  600 mL removed. No hemorrhage in the fluid.  LDH elevated meeting criteria for exudative effusion.  Cultures negative.  Cytology positive for adenocarcinoma.  COPD with exacerbation with bronchitis Reporting 2 weeks of progressively worsening shortness of breath and cough. Uses inhalers at home. Was started on IV antibiotic. Procalcitonin is negative. Too early for radiation pneumonitis.  Wheezing appears to be improved. Continue steroids antibiotic Mucinex  and other medications for cough. Currently receiving Nebulizer therapy.  Malignant neoplasm of upper lobe of right lung stage IV metastatic to ribs and sternum Patient currently receiving XRT with Dr. Jeryl Moris who was notified about patient's admission Oncology consult appreciated. Plan is to initiate systemic chemotherapy Multiple drug interaction. Unable to use Cardizem , amiodarone as well as DOAC's. Pleural effusion cytology came back positive for adenocarcinoma.  Patient aware.   Hypertensive urgency On admission blood pressure was 167/109 with heart rate in 120s Currently blood pressure significantly better.  Obesity Class 1 Body mass index is 33.89 kg/m.  Placing the pt at higher risk of poor outcomes.  Mild hypokalemia. Replaced.  Acute diastolic CHF. Mild.  Has some swelling in his lower EXTR. Echocardiogram EF 55 to 60%.   Grade 1 diastolic dysfunction.  Large circumferential pericardial effusion. As needed IV Lasix  right now.  Will defer to cardiology for further management.

## 2023-12-28 NOTE — Telephone Encounter (Signed)
 Oral Oncology Patient Advocate Encounter  Prior Authorization for Ronn Cohn has been approved.    PA# [id number] Effective dates: 12/28/2023 through 06/25/2024  Patients co-pay is $1,338.76.   Patty Benjaman Branch, CPhT Oncology Pharmacy Patient Advocate [facility] Direct Number: [phone] Fax: [phone]

## 2023-12-28 NOTE — Progress Notes (Signed)
 PHARMACY - ANTICOAGULATION Pharmacy Consult for heparin   Indication: atrial fibrillation   Allergies  Allergen Reactions   Bee Venom Anaphylaxis    syncope    Patient Measurements: Height: 6\' 1"  (185.4 cm) Weight: 116.5 kg (256 lb 13.4 oz) IBW/kg (Calculated) : 79.9 HEPARIN  DW (KG): 104.9  Vital Signs: Temp: 97.6 F (36.4 C) (05/15 2000) Temp Source: Oral (05/15 2000) BP: 125/82 (05/15 2000) Pulse Rate: 94 (05/15 2000)  Labs: Recent Labs    12/26/23 1314 12/26/23 2325 12/27/23 0408 12/27/23 0408 12/27/23 1809 12/28/23 0159 12/28/23 1200 12/28/23 1954  HGB  --   --  14.0  --  14.4 13.8  --   --   HCT  --   --  41.8  --  42.6 41.3  --   --   PLT  --   --  173  --  179 181  --   --   APTT  --   --   --   --  29  --   --   --   LABPROT  --   --  14.5  --   --   --   --   --   INR  --   --  1.1  --   --   --   --   --   HEPARINUNFRC  --   --   --    < > <0.10* 0.21* 0.30 0.32  CREATININE  --  1.08 0.92  --  1.02 1.05  --   --   TROPONINIHS 57* 53* 48*  --   --   --   --   --    < > = values in this interval not displayed.    Estimated Creatinine Clearance: 88.8 mL/min (by C-G formula based on SCr of 1.05 mg/dL).   Assessment: 68 yo M with PMG stage IV metastatic lung cancer with afib + RVR. Pt also with small stroke burden from stroke in April 2025- likely embolic per neuro. No anticoag PTA. Ok to start anticoagulation per neuro, cardiology, and TRH primary. Pharmacy consulted to start heparin  infusion (no bolus).  Heparin  level this evening is within goal range at 0.32 on 1550 units/hr.  No known issues with IV infusion, no overt bleeding or complications noted.  Goal of Therapy:  Heparin  level 0.3-0.7 units/ml Monitor platelets by anticoagulation protocol: Yes   Plan:  Continue IV heparin  at current rate of 1550 units/hr Daily heparin  level and CBC Monitor for s/sx of bleeding  F/u plans for oral anticoagulation eventually.  There does appear to be  significant drug interaction between encorafenib  and Xarelto/Eliquis.  Thank you for involving pharmacy in this patient's care.  Caroline Cinnamon, PharmD, BCPS Clinical Pharmacist Clinical phone for 12/28/2023 is [phone] 12/28/2023 8:48 PM

## 2023-12-28 NOTE — Progress Notes (Signed)
 [facility] Progress Note Patient: Jose Joseph JJK:[id number] DOB: February 17, 1956 DOA: 12/26/2023  DOS: the patient was seen and examined on 12/28/2023  Brief Hospital Course: Patient with PMH of multiple colon cancer with metastasis to sternum and ribs undergoing radiation therapy, COPD/asthma, depression, former smoker, obesity presented to the hospital with complaints of shortness of breath and abnormal echocardiogram. Shortness of breath specifically occurring on exertion ongoing for 2 weeks. Found to have large pericardial effusion without tamponade, A-fib with RVR and pleural effusion. Cardiology, medical oncology and IR were consulted. CT surgery also reviewed patient's echocardiogram.  Assessment and Plan: Large pericardial effusion. Currently no tamponade physiology. Underwent outpatient echocardiogram before initiation of chemotherapy and with new onset hypertension and was found to have large circumferential pericardial effusion. Was recommended to go to ER for further workup. Seen by cardiology.  Images were reviewed with interventional cardiology as well as CT surgery. Will follow cardiology recommendation.  Currently n.p.o. in case he has a plan for pericardiocentesis. Per CT surgery not a good candidate for pericardial window for now.   New onset A-fib with RVR: After admission patient was found to be in RVR. No prior history. Started on Cardizem bolus followed by Cardizem infusion. Remains in RVR and therefore metoprolol was added as well. CHADVASC score is relatively high. Cardiology recommended to initiate the patient on IV heparin . IV heparin  was initiated without a bolus given risk for worsening of his pericardial effusion. Rate appears to be well-controlled overnight. Management per cardiology.   Bilateral small to moderate pleural effusion: Small to moderate effusion noted on CT of chest. IR performed left pleural effusion thoracentesis.  600 mL removed. No  hemorrhage in the fluid. Will await analysis although appears to be transudative.  COPD with exacerbation with bronchitis Reporting 2 weeks of progressively worsening shortness of breath and cough. Has intermittent wheezing on exam. Uses inhalers at home. Was started on IV antibiotic. Procalcitonin is negative. Will continue the antibiotics for now. Steroids added. Continue Mucinex and cough supportive medication. Switching inhalers to nebulizer therapy for now monitor response. Too early for radiation pneumonitis   Malignant neoplasm of upper lobe of right lung stage IV metastatic to ribs and sternum Patient currently receiving XRT with Dr. Jeryl Moris who was notified about patient's admission Oncology consult appreciated. Plan is to initiate systemic chemotherapy now as soon as possible.   Hypertensive urgency On admission blood pressure was 167/109 with heart rate in 120s  After Cardizem blood pressure improved. Was actually hypotensive on 5/14 evening.  Requiring IV fluid bolus. Currently we keep IV fluid for now until cardiology decides on the n.p.o. status. Reduce the rate since blood pressure is improved. Defer to cardiology with regards to Cardizem and metoprolol.  Obesity Class 1 Body mass index is 33.89 kg/m.  Placing the pt at higher risk of poor outcomes.  Mild hypokalemia. Replaced.  Acute diastolic CHF. Mild.  Has some swelling in his lower EXTR. Echocardiogram EF 55 to 60%.  Grade 1 diastolic dysfunction.  Large circumferential pericardial effusion. Received IV Lasix by cardiology. Currently due to hypotension patient was given IV fluid. Therefore IV Lasix is on hold. Will defer to cardiology with regards to reinitiation of Lasix.   Subjective: Shortness of breath improving.  No nausea no vomiting no fever no chills.  No chest pain.  Physical Exam: General: in Mild distress, No Rash Cardiovascular: S1 and S2 Present, No Murmur Respiratory: Good respiratory  effort, Bilateral Air entry present. No Crackles, bilateral expiratory wheezes Abdomen:  Bowel Sound present, No tenderness Extremities: Bilateral trace edema Neuro: Alert and oriented x3, no new focal deficit  Data Reviewed: I have Reviewed nursing notes, Vitals, and Lab results. Since last encounter, pertinent lab results CBC and BMP   . I have ordered test including CBC and BMP  .   Disposition: Status is: Inpatient Remains inpatient appropriate because: Further management by cardiology.   Family Communication: Wife at bedside.  Patient allowed to discuss medical plan in front of family. Level of care: Progressive continue. Vitals:   12/28/23 0419 12/28/23 0600 12/28/23 0736 12/28/23 0904  BP:   (!) 115/90 115/69  Pulse:  84 90 (!) 104  Resp:  19 20 (!) 24  Temp: (!) 97.5 F (36.4 C)  97.6 F (36.4 C)   TempSrc: Oral  Oral   SpO2:  92% 92% 91%  Weight:      Height:         Author: Charlean Congress, MD 12/28/2023 9:58 AM  Please look on [email or web address] to find out who is on call.

## 2023-12-28 NOTE — Telephone Encounter (Signed)
 Oral Oncology Pharmacist Encounter  Received new prescription for Tafinlar (dabrafenib) and Mekinist (trametinib) for the treatment of stage IV adenocarcinoma of the lung, BRAF V600E positive mutation, planned duration until disease progression or unacceptable toxicity. Patient is having to switch from Braftovi / Mektovi  due to patient needing anticoagulation after patient was hospitalized and will need to be started on either eliquis or xarelto at discharge.   Labs from 12/28/2023 assessed, no interventions needed. Pending prescriptions to be ordered by oncologist.  Current medication list in Epic reviewed, DDIs with Tafinlar (dabrafenib) identified: - citalopram : dabrafenib may increase Qtc prolonging effects of citalopram . Patient will need to be monitored for Qtc interval prolongation. No baseline dose adjustment needed.  - Eliquis (cat B): dabrafenib may decrease the concentration of eliquis although no actions are needed and severity is relatively minor versus alternative medication options. - Trelegy Ellipta  (cat B): No action needed. QT-prolonging agents may increase Qtc prolonging effects. Patient will need to be monitored for Qtc prolongation as stated above.  No significant/ relevant DDIs with Tmekinist (trametinib) identified.   Evaluated chart and no patient barriers to medication adherence noted.   Prescription has been e-scribed to the [facility] for benefits analysis and approval.  Oral Oncology Clinic will continue to follow for insurance authorization, copayment issues, initial counseling and start date.  Marice Guidone, PharmD Hematology/Oncology Clinical Pharmacist [facility] [phone] 12/28/2023 2:25 PM

## 2023-12-28 NOTE — Telephone Encounter (Signed)
 Oral Oncology Patient Advocate Encounter   Received notification that prior authorization for Tafinlar is required.   PA submitted on 12/28/2023 Key [id number] Status is pending     Patty Benjaman Branch, CPhT Oncology Pharmacy Patient Advocate [facility] Direct Number: [phone] Fax: [phone]

## 2023-12-28 NOTE — Procedures (Signed)
 PROCEDURE SUMMARY:  Successful US  guided left thoracentesis. Yielded 600 mL of clear, yellow fluid. Patient tolerated procedure well. No immediate complications. EBL = trace  Specimen was sent for labs.  Post procedure chest X-ray pending.   Pasty Bongo PA-C 12/28/2023 8:44 AM

## 2023-12-28 NOTE — Telephone Encounter (Signed)
 Oral Oncology Patient Advocate Encounter  Patient might need to change meds. He now has afib requiring anticoagulation and the combo interacts with doacs.  Will create new encounter for new drug(s) once we get more information from MD.  Thresa Floor, CPhT Oncology Pharmacy Patient Advocate [facility] Direct Number: [phone] Fax: [phone]

## 2023-12-28 NOTE — Telephone Encounter (Signed)
 Oral Oncology Patient Advocate Encounter   Received notification that prior authorization for Mekinist is required.   PA submitted on 12/29/2023 Key [id number] Status is pending     Patty Benjaman Branch, CPhT Oncology Pharmacy Patient Advocate [facility] Direct Number: [phone] Fax: [phone]

## 2023-12-28 NOTE — Progress Notes (Signed)
 Mobility Specialist Progress Note;   12/28/23 0956  Mobility  Activity Ambulated with assistance in hallway  Level of Assistance Contact guard assist, steadying assist  Assistive Device None  Distance Ambulated (ft) 75 ft  Activity Response Tolerated well  Mobility Referral Yes  Mobility visit 1 Mobility  Mobility Specialist Start Time (ACUTE ONLY) 0956  Mobility Specialist Stop Time (ACUTE ONLY) 1005  Mobility Specialist Time Calculation (min) (ACUTE ONLY) 9 min   Pt agreeable to mobility. On 2LO2 upon arrival. Required light MinG assistance during ambulation for safety. Ambulated on 2LO2, SPO2 90%> throughout. Pt returned back to sitting on EoB with all needs met. Wife in room.   Janit Meline Mobility Specialist Please contact via SecureChat or [employer] [phone]

## 2023-12-28 NOTE — Progress Notes (Signed)
 PHARMACY - ANTICOAGULATION Pharmacy Consult for heparin   Indication: atrial fibrillation   Allergies  Allergen Reactions   Bee Venom Anaphylaxis    syncope    Patient Measurements: Height: 6\' 1"  (185.4 cm) Weight: 116.5 kg (256 lb 13.4 oz) IBW/kg (Calculated) : 79.9 HEPARIN  DW (KG): 104.9  Vital Signs: Temp: 98.2 F (36.8 C) (05/15 1128) Temp Source: Oral (05/15 1128) BP: 124/93 (05/15 1241) Pulse Rate: 102 (05/15 1128)  Labs: Recent Labs    12/26/23 1314 12/26/23 2325 12/27/23 0408 12/27/23 1809 12/28/23 0159 12/28/23 1200  HGB  --   --  14.0 14.4 13.8  --   HCT  --   --  41.8 42.6 41.3  --   PLT  --   --  173 179 181  --   APTT  --   --   --  29  --   --   LABPROT  --   --  14.5  --   --   --   INR  --   --  1.1  --   --   --   HEPARINUNFRC  --   --   --  <0.10* 0.21* 0.30  CREATININE  --  1.08 0.92 1.02 1.05  --   TROPONINIHS 57* 53* 48*  --   --   --     Estimated Creatinine Clearance: 88.8 mL/min (by C-G formula based on SCr of 1.05 mg/dL).   Assessment: 68 yo M with PMG stage IV metastatic lung cancer with afib + RVR. Pt also with small stroke burden from stroke in April 2025- likely embolic per neuro. No anticoag PTA. Ok to start anticoagulation per neuro, cardiology, and TRH primary. Pharmacy consulted to start heparin  infusion (no bolus)   Heparin  level this afternoon is within goal range at 0.3.  No known issues with IV infusion, no overt bleeding or complications noted.  Goal of Therapy:  Heparin  level 0.3-0.7 units/ml Monitor platelets by anticoagulation protocol: Yes   Plan:  Continue IV heparin  at current rate of 1550 units/hr Repeat heparin  level in 6 hrs to confirm. Daily heparin  level and CBC.  F/u plans for oral anticoagulation eventually.  There does appear to be significant drug interaction between encorafenib  and Xarelto/Eliquis.  [name], [name], BCCP Clinical Pharmacist  12/28/2023 1:19 PM   [facility] pharmacy phone  numbers are listed on [email or web address]

## 2023-12-29 ENCOUNTER — Other Ambulatory Visit (HOSPITAL_COMMUNITY): Payer: Self-pay

## 2023-12-29 ENCOUNTER — Telehealth: Payer: Self-pay | Admitting: Pharmacy Technician

## 2023-12-29 ENCOUNTER — Ambulatory Visit

## 2023-12-29 ENCOUNTER — Telehealth (HOSPITAL_COMMUNITY): Payer: Self-pay

## 2023-12-29 DIAGNOSIS — R0609 Other forms of dyspnea: Secondary | ICD-10-CM | POA: Diagnosis not present

## 2023-12-29 DIAGNOSIS — I4891 Unspecified atrial fibrillation: Secondary | ICD-10-CM | POA: Diagnosis not present

## 2023-12-29 DIAGNOSIS — I3139 Other pericardial effusion (noninflammatory): Secondary | ICD-10-CM | POA: Diagnosis not present

## 2023-12-29 DIAGNOSIS — J449 Chronic obstructive pulmonary disease, unspecified: Secondary | ICD-10-CM | POA: Diagnosis not present

## 2023-12-29 LAB — CBC
HCT: 39.6 % (ref 39.0–52.0)
Hemoglobin: 13 g/dL (ref 13.0–17.0)
MCH: 30.4 pg (ref 26.0–34.0)
MCHC: 32.8 g/dL (ref 30.0–36.0)
MCV: 92.7 fL (ref 80.0–100.0)
Platelets: 168 10*3/uL (ref 150–400)
RBC: 4.27 MIL/uL (ref 4.22–5.81)
RDW: 12.7 % (ref 11.5–15.5)
WBC: 11.2 10*3/uL — ABNORMAL HIGH (ref 4.0–10.5)
nRBC: 0 % (ref 0.0–0.2)

## 2023-12-29 LAB — BASIC METABOLIC PANEL WITH GFR
Anion gap: 8 (ref 5–15)
BUN: 34 mg/dL — ABNORMAL HIGH (ref 8–23)
CO2: 25 mmol/L (ref 22–32)
Calcium: 8.8 mg/dL — ABNORMAL LOW (ref 8.9–10.3)
Chloride: 105 mmol/L (ref 98–111)
Creatinine, Ser: 1.1 mg/dL (ref 0.61–1.24)
GFR, Estimated: >60 mL/min (ref >60)
Glucose, Bld: 108 mg/dL — ABNORMAL HIGH (ref 70–99)
Potassium: 3.9 mmol/L (ref 3.5–5.1)
Sodium: 138 mmol/L (ref 135–145)

## 2023-12-29 LAB — LACTATE DEHYDROGENASE: LDH: 201 U/L — ABNORMAL HIGH (ref 98–192)

## 2023-12-29 LAB — HEPARIN LEVEL (UNFRACTIONATED): Heparin Unfractionated: 0.41 [IU]/mL (ref 0.30–0.70)

## 2023-12-29 LAB — MAGNESIUM: Magnesium: 2.2 mg/dL (ref 1.7–2.4)

## 2023-12-29 MED ORDER — POTASSIUM CHLORIDE CRYS ER 20 MEQ PO TBCR
20.0000 meq | EXTENDED_RELEASE_TABLET | Freq: Once | ORAL | Status: AC
  Administered 2023-12-29: 20 meq via ORAL
  Filled 2023-12-29: qty 1

## 2023-12-29 MED ORDER — METOPROLOL TARTRATE 5 MG/5ML IV SOLN
2.5000 mg | Freq: Once | INTRAVENOUS | Status: AC
Start: 2023-12-29 — End: 2023-12-29

## 2023-12-29 MED ORDER — METOPROLOL TARTRATE 5 MG/5ML IV SOLN
INTRAVENOUS | Status: AC
  Filled 2023-12-29: qty 5

## 2023-12-29 MED ORDER — PREDNISONE 10 MG PO TABS
10.0000 mg | ORAL_TABLET | Freq: Every day | ORAL | Status: DC
  Administered 2024-01-02: 10 mg via ORAL
  Filled 2023-12-29: qty 1

## 2023-12-29 MED ORDER — METOPROLOL TARTRATE 5 MG/5ML IV SOLN
INTRAVENOUS | Status: AC
Start: 2023-12-29 — End: 2023-12-29
  Administered 2023-12-29: 2.5 mg via INTRAVENOUS
  Filled 2023-12-29: qty 5

## 2023-12-29 MED ORDER — METOPROLOL TARTRATE 50 MG PO TABS: 50.0000 mg | ORAL_TABLET | Freq: Three times a day (TID) | ORAL | Status: DC

## 2023-12-29 MED ORDER — FUROSEMIDE 10 MG/ML IJ SOLN
20.0000 mg | Freq: Once | INTRAMUSCULAR | Status: AC
  Administered 2023-12-29: 20 mg via INTRAVENOUS
  Filled 2023-12-29: qty 2

## 2023-12-29 MED ORDER — DABRAFENIB MESYLATE 75 MG PO CAPS: 150.0000 mg | ORAL_CAPSULE | Freq: Two times a day (BID) | ORAL | 0 refills | Status: DC

## 2023-12-29 MED ORDER — CEFADROXIL 500 MG PO CAPS
500.0000 mg | ORAL_CAPSULE | Freq: Two times a day (BID) | ORAL | Status: AC
  Administered 2023-12-29 – 2023-12-31 (×4): 500 mg via ORAL
  Filled 2023-12-29 (×4): qty 1

## 2023-12-29 MED ORDER — METOPROLOL TARTRATE 50 MG PO TABS
50.0000 mg | ORAL_TABLET | Freq: Two times a day (BID) | ORAL | Status: DC
  Administered 2023-12-29 – 2024-01-02 (×9): 50 mg via ORAL
  Filled 2023-12-29 (×9): qty 1

## 2023-12-29 MED ORDER — METOPROLOL TARTRATE 5 MG/5ML IV SOLN
2.5000 mg | INTRAVENOUS | Status: DC | PRN
  Administered 2023-12-29: 2.5 mg via INTRAVENOUS

## 2023-12-29 MED ORDER — PREDNISONE 20 MG PO TABS
20.0000 mg | ORAL_TABLET | Freq: Every day | ORAL | Status: AC
  Administered 2023-12-31 – 2024-01-01 (×2): 20 mg via ORAL
  Filled 2023-12-29 (×2): qty 1

## 2023-12-29 MED ORDER — PREDNISONE 20 MG PO TABS
30.0000 mg | ORAL_TABLET | Freq: Every day | ORAL | Status: AC
  Administered 2023-12-29 – 2023-12-30 (×2): 30 mg via ORAL
  Filled 2023-12-29 (×2): qty 1

## 2023-12-29 NOTE — Progress Notes (Addendum)
 Rounding Note    Patient Name: Jose Joseph Date of Encounter: 12/29/2023  [facility] Cardiologist: Olinda Bertrand, DO  Chief Complaint:  Chief Complaint  Patient presents with   Shortness of Breath  Reason of consult: Pericardial effusion  Subjective   Denies chest pain. Shortness of breath improved when compared to when he arrived to the ED Remains in A-fib  Inpatient Medications    Scheduled Meds:  arformoterol  15 mcg Nebulization BID   benzonatate  100 mg Oral TID   binimetinib   45 mg Oral BID   budesonide (PULMICORT) nebulizer solution  0.25 mg Nebulization BID   Chlorhexidine  Gluconate Cloth  6 each Topical Daily   citalopram   20 mg Oral q AM   encorafenib   450 mg Oral Daily   guaiFENesin  1,200 mg Oral BID   loratadine   10 mg Oral Daily   metoprolol tartrate  50 mg Oral BID   predniSONE   30 mg Oral Q breakfast   Followed by   Cecily Cohen ON 12/31/2023] predniSONE   20 mg Oral Q breakfast   Followed by   Cecily Cohen ON 01/02/2024] predniSONE   10 mg Oral Q breakfast   revefenacin  175 mcg Nebulization Daily   sodium chloride  flush  3 mL Intravenous Q12H   Continuous Infusions:  azithromycin  250 mL/hr at 12/29/23 1200   cefTRIAXone (ROCEPHIN)  IV Stopped (12/29/23 1026)   diltiazem (CARDIZEM) infusion 5 mg/hr (12/29/23 1450)   heparin  1,550 Units/hr (12/29/23 1200)   PRN Meds: acetaminophen  **OR** acetaminophen , HYDROmorphone  (DILAUDID ) injection, levalbuterol, ondansetron  **OR** ondansetron  (ZOFRAN ) IV, oxyCODONE , polyethylene glycol, sodium chloride  flush   Vital Signs    Vitals:   12/29/23 0525 12/29/23 0714 12/29/23 0935 12/29/23 1100  BP: (!) 136/113 104/83 103/68 106/80  Pulse: 99 (!) 116 (!) 112 87  Resp:  20  (!) 24  Temp:  97.9 F (36.6 C)  98.1 F (36.7 C)  TempSrc:  Oral  Oral  SpO2:  95%  95%  Weight:      Height:        Intake/Output Summary (Last 24 hours) at 12/29/2023 1527 Last data filed at 12/29/2023 1500 Gross per 24 hour   Intake 2234.69 ml  Output 1050 ml  Net 1184.69 ml      12/27/2023    4:33 PM 12/26/2023   10:20 AM 12/21/2023   10:13 AM  Last 3 Weights  Weight (lbs) 256 lb 13.4 oz 261 lb 266 lb  Weight (kg) 116.5 kg 118.389 kg 120.657 kg      Telemetry    A-fib with controlled ventricular rate- Personally Reviewed  ECG    12/27/2023 atrial fibrillation with rapid ventricular rate, 128 bpm, low voltage, ST-T changes, occasional PVCs- Personally Reviewed  Physical Exam   General: Age appropriate, hemodynamically stable, mild distress due to dyspnea HEENT: Moist membranes, trachea midline, JVP, port present Lungs: Decreased breath sounds bilaterally, no rales or rhonchi Heart: Irregularly, irregular, tachycardic, variable S1-S2, no murmurs rubs or gallops appreciated secondary to tachycardia Abdomen: Soft, nontender, nondistended, positive bowel sounds in all 4 quadrants Extremities: Cool to touch, trace pitting edema, no cyanosis or clubbing Neuro: Moves all 4 extremities, alert oriented x 4  Labs    High Sensitivity Troponin:   Recent Labs  Lab 12/26/23 1026 12/26/23 1314 12/26/23 2325 12/27/23 0408  TROPONINIHS 57* 57* 53* 48*     Chemistry Recent Labs  Lab 12/26/23 2325 12/27/23 0408 12/27/23 1809 12/28/23 0159 12/29/23 0536  NA 140   < >  137 141 138  K 3.1*   < > 3.4* 3.5 3.9  CL 104   < > 101 103 105  CO2 26   < > 25 26 25   GLUCOSE 130*   < > 115* 118* 108*  BUN 21   < > 16 26* 34*  CREATININE 1.08   < > 1.02 1.05 1.10  CALCIUM 9.1   < > 9.2 9.4 8.8*  MG 1.9  --   --  1.9 2.2  PROT  --   --  6.6  --   --   ALBUMIN  --   --  3.1*  --   --   AST  --   --  17  --   --   ALT  --   --  22  --   --   ALKPHOS  --   --  57  --   --   BILITOT  --   --  0.9  --   --   GFRNONAA >60   < > >60 >60 >60  ANIONGAP 10   < > 11 12 8    < > = values in this interval not displayed.    Lipids No results for input(s): "CHOL", "TRIG", "HDL", "LABVLDL", "LDLCALC", "CHOLHDL" in the  last 168 hours.  Hematology Recent Labs  Lab 12/27/23 1809 12/28/23 0159 12/29/23 0536  WBC 15.0* 13.2* 11.2*  RBC 4.76 4.50 4.27  HGB 14.4 13.8 13.0  HCT 42.6 41.3 39.6  MCV 89.5 91.8 92.7  MCH 30.3 30.7 30.4  MCHC 33.8 33.4 32.8  RDW 12.8 12.8 12.7  PLT 179 181 168   Thyroid  Recent Labs  Lab 12/27/23 0408  TSH 1.552    BNP Recent Labs  Lab 12/27/23 0408  BNP 71.4    DDimer No results for input(s): "DDIMER" in the last 168 hours.   Radiology    MRI w/ and /wo contrast:  April 2025 1. Small foci of diffusion-weighted signal abnormality within the anterior right frontal lobe, right parietooccipital lobes, left parietal lobe and right cerebellar hemisphere (measuring up to 5 mm). There is no appreciable corresponding pathologic enhancement at these sites, and these likely reflect acute/subacute infarcts (involvement of multiple vascular territories concerning for an embolic process). However, a short-interval follow-up brain MRI is recommended in 6 weeks to ensure expected evolution and to exclude poorly enhancing metastases. 2. Background T2 FLAIR hyperintense signal changes within the cerebral white matter (moderate) and pons (mild), nonspecific but most often secondary to chronic small vessel ischemia. 3. 10 mm left maxillary sinus mucous retention cyst.  CTA PE Study 12/26/2023 1. No pulmonary embolism. Diffuse bronchial wall thickening with perihilar ground-glass airspace opacities in both lungs. This could represent changes of developing multifocal pneumonia versus early pulmonary edema. Small right and small to moderate left pleural effusions. 2. Worsening metastatic lymphadenopathy in the lower neck and chest, as delineated above. 3. Similar size and number of scattered bilateral pulmonary metastases, the largest in the right lower lobe measures 1.6 cm (previously 1.5 cm). 4. Progressive bony metastatic disease, now involving the right anterolateral first  rib. increased size of the left posterolateral sixth rib and inferior sternal body lesions. 5. Interval development of a small nodule in the right adrenal gland, measuring 1.1 cm, worrisome for new metastatic disease.  Cardiac Studies   Echo 12/25/23:  1. Left ventricular ejection fraction, by estimation, is 55 to 60%. The  left ventricle has normal function. The left ventricle has  no regional  wall motion abnormalities. Left ventricular diastolic parameters are  consistent with Grade I diastolic  dysfunction (impaired relaxation).   2. Right ventricular systolic function is normal. The right ventricular  size is normal.   3. Large pericardial effusion. The pericardial effusion is  circumferential. There is no evidence of cardiac tamponade.   4. The mitral valve is normal in structure. Trivial mitral valve  regurgitation. No evidence of mitral stenosis.   5. The aortic valve is tricuspid. There is mild calcification of the  aortic valve. Aortic valve regurgitation is not visualized. Aortic valve  sclerosis/calcification is present, without any evidence of aortic  stenosis.   6. The inferior vena cava is dilated in size with >50% respiratory  variability, suggesting right atrial pressure of 8 mmHg.  Patient Profile     68 y.o. male former smoker, COPD, stage IV lung cancer with bone mets, pericardial effusion, pleural effusion, newly discovered atrial fibrillation, documented history of strokes (MRI April 2025).  Assessment & Plan   Assessment and plan: Shortness of breath Improving since arrival to the ED Multifactorial: Worsening cancer burden, parenchymal disease, pericardial effusion, pleural effusion status post thoracentesis 12/28/2023, hypertensive urgency on arrival, and new onset of A-fib with RVR Management as discussed below  Pericardial effusion: Noted incidentally on an echocardiogram performed for elevated blood pressures. Reported to be a large pericardial effusion  on independent review it appears to be moderate to large circumferential Reached out to interventional cardiology when he presented to ED. Dr. Addie Holstein reviewed the images and felt that based on location pericardial window may be a better option. Reached out CT surgery and Dr. Deloise Ferries reviewed his CT while patient was at [facility] and felt he would benefit from thoracentesis and/or pericardiocentesis as opposed to pericardial window. Addressing the pericardial effusion was strictly for symptom control as he was quite dyspneic, oxygen requirement, patient favoring to sit up and tripod position. Dyspnea has somewhat improved s/p thoracentesis and w/ better rate control strategy  Spoke to pharmacy he will complete his 48hr of IVHeparin later today at 8pm.  Will arrange a limited echocardiogram for tomorrow to re-evaluate his pericardial effusion and its significance.  If the repeat echo does not illustrate tamponade physiology or worsening effusion and symptomatically he is improving I would favor conservative management with regards to pericardial effusion w/ serial echocardiogram for follow up. I understand the technically difficulties of pericardial window given his parenchymal disease, worsening cancer burden, and known metastatic cancer.  However if the effusion is worsening or there is hemodynamic compromise then addressing the pericardial effusion (for diagnostic and therapeutic purposes) during his hospitalization would be more imperative after discussing the options w/ patient and wife.  CT surgery team is aware and have seen the patient - preliminary note reviewed.   Newly discovered atrial fibrillation with controlled ventricular rate: Was in RVR earlier this admission  Rate control: Cardizem & Lopressor. Rhythm control: N/A. Thromboembolic prophylaxis: IV heparin  drip His recent MRI in April 2025 illustrates findings of acute and subacute strokes.  Neurology was reached out by attending  physician at [facility] who okayed initiating anticoagulation with close monitoring. Given his chads Vascor oral anticoagulation is ideally favored to prevent thromboembolic events.  Risks, benefits, and alternatives to oral anticoagulation were discussed as part of shared decision making.  In addition, given the pericardial effusion I did voice my concerns that being on anticoagulation predisposes him to having hemorrhagic pericardial effusion which may lead to worsening morbidity or  mortality.  Patient verbalized understanding and voiced that based on imaging he has had strokes already and would like to avoid additional reoccurrences.  He understands the risks of being on anticoagulation and agreed starting anticoagulation.  He echoed the same with attending physician at [facility] health. Currently on IV heparin  drip. Does not endorse evidence of bleeding We will repeat a limited echocardiogram in 48 hours to reevaluate pericardial effusion and tamponade physiology.  If overall stable may transition to oral anticoagulation with outpatient monitoring.  If he has worsening pericardial effusion this will need to be reevaluated and addressed.  Increase Lopressor to 50 mg p.o. twice daily due to elevated ventricular rates. Recommended starting Cardizem drip at 5 for better rate control and easy to wean. Not an ideal candidate for cardioversion at this time as his underlying dyspnea will predispose him to ERAF.  Will focus on rate control strategy Ms. Carney (PharmD) and oncology pharmacist have recommended that we transition him to Lovenox at time of d/c as oppose to [facility] to minimize side effect profile / interactions with the current cancer treatment. They will reconsider [facility] as outpatient once cancer treatment changes. I have asked pharmacy to document in the chart from care coordination perspective. Dr. Lydia Sams is aware as well.   Click Here to Calculate/Change CHADS2VASc Score The patient's CHADS2-VASc score is  5, indicating a 7.2% annual risk of stroke.    Stage IV lung cancer with bone mets: Oncology documentation reviewed. Started encorafenib  and binimetinib  12/28/2023  Pleural effusion: Status post left thoracentesis 12/28/2023 600 cc of clear/yellow fluid removed specimen sent to lab according to the progress note  Hypertensive urgency: Resolved  Benign essential hypertension: Started on antihypertensive medications but held as he was placed on Cardizem gtt. Re-evaluate closer to d/c   COPD: Management per primary team  Former smoker   For questions or updates, please contact [facility] Please consult [email or web address] for contact info under     Signed, Olinda Bertrand, DO, [facility]  [facility]  Pager: [phone] Office: [phone] 12/29/2023, 3:27 PM

## 2023-12-29 NOTE — Care Management Important Message (Signed)
 Important Message  Patient Details  Name: Jose Joseph MRN: [id number] Date of Birth: 11/05/1955   Important Message Given:  Yes - Medicare IM     Wynonia Hedges 12/29/2023, 1:26 PM

## 2023-12-29 NOTE — Telephone Encounter (Signed)
 Oral Oncology Patient Advocate Encounter   Began application for assistance for Mekinist & Tafinlar through [employer].   Application will be submitted upon completion of necessary supporting documentation.   [employer]PA[employer] phone number: [phone] Fax number: [phone]   I have sent PAP application via email to Lorean Rodes (for MD signature) and [name] (for pt signature).  I will still need patient's medicare extra help denial letter and income information before I am able to submit.   Patty Benjaman Branch, CPhT Oncology Pharmacy Patient Advocate [facility] Direct Number: [phone] Fax: [phone]

## 2023-12-29 NOTE — Progress Notes (Signed)
 PHARMACY - ANTICOAGULATION Pharmacy Consult for heparin   Indication: atrial fibrillation   Allergies  Allergen Reactions   Bee Venom Anaphylaxis    syncope    Patient Measurements: Height: 6\' 1"  (185.4 cm) Weight: 116.5 kg (256 lb 13.4 oz) IBW/kg (Calculated) : 79.9 HEPARIN  DW (KG): 104.9  Vital Signs: Temp: 97.9 F (36.6 C) (05/16 0714) Temp Source: Oral (05/16 0714) BP: 103/68 (05/16 0935) Pulse Rate: 112 (05/16 0935)  Labs: Recent Labs    12/26/23 1314 12/26/23 2325 12/26/23 2325 12/27/23 0408 12/27/23 0408 12/27/23 1809 12/28/23 0159 12/28/23 1200 12/28/23 1954 12/29/23 0536  HGB  --   --    < > 14.0  --  14.4 13.8  --   --  13.0  HCT  --   --    < > 41.8  --  42.6 41.3  --   --  39.6  PLT  --   --    < > 173  --  179 181  --   --  168  APTT  --   --   --   --   --  29  --   --   --   --   LABPROT  --   --   --  14.5  --   --   --   --   --   --   INR  --   --   --  1.1  --   --   --   --   --   --   HEPARINUNFRC  --   --   --   --    < > <0.10* 0.21* 0.30 0.32 0.41  CREATININE  --  1.08   < > 0.92  --  1.02 1.05  --   --  1.10  TROPONINIHS 57* 53*  --  48*  --   --   --   --   --   --    < > = values in this interval not displayed.    Estimated Creatinine Clearance: 84.7 mL/min (by C-G formula based on SCr of 1.1 mg/dL).   Assessment: 68 yo M with PMG stage IV metastatic lung cancer with afib + RVR. Pt also with small stroke burden from stroke in April 2025- likely embolic per neuro. No anticoag PTA. Ok to start anticoagulation per neuro, cardiology, and TRH primary. Pharmacy consulted to start heparin  infusion (no bolus).  Heparin  level is within goal range at 0.41 on 1550 units/hr.  No known issues with IV infusion, no overt bleeding or complications noted.  Goal of Therapy:  Heparin  level 0.3-0.7 units/ml Monitor platelets by anticoagulation protocol: Yes   Plan:  Continue IV heparin  at current rate of 1550 units/hr Daily heparin  level and  CBC Monitor for s/sx of bleeding  F/u plans for oral anticoagulation eventually.  There does appear to be significant drug interaction between encorafenib  and Xarelto/Eliquis, will likely need to use lovenox 1 mg/kg q 12 hrs until plans for alternative chemo therapy can be made.  Will check copays.  Thank you for involving pharmacy in this patient's care.  [name], [name], BCCP Clinical Pharmacist  12/29/2023 10:59 AM   [facility] pharmacy phone numbers are listed on [email or web address]

## 2023-12-29 NOTE — Telephone Encounter (Signed)
 Pharmacy Patient Advocate Encounter  Insurance verification completed.    The patient is insured through [employer]. Patient has Medicare and is not eligible for a copay card, but may be able to apply for patient assistance or Medicare RX Payment Plan (Patient Must reach out to their plan, if eligible for payment plan), if available.    Ran test claim for Lovenox and the current 30 day co-pay is $203.71.  Ran test claim for Eliquis and the current 30 day co-pay is $114.04.  This test claim was processed through [facility]- copay amounts may vary at other pharmacies due to pharmacy/plan contracts, or as the patient moves through the different stages of their insurance plan.

## 2023-12-29 NOTE — Telephone Encounter (Signed)
 Oral Oncology Patient Advocate Encounter  Prior Authorization for Mekinist has been approved.    PA# [id number] Effective dates: 12/29/2023 through 06/26/2024  Patients co-pay is $1,338.76.    Patty Benjaman Branch, CPhT Oncology Pharmacy Patient Advocate [facility] Direct Number: [phone] Fax: [phone]

## 2023-12-29 NOTE — Plan of Care (Signed)

## 2023-12-29 NOTE — Progress Notes (Signed)
 [facility] Progress Note Patient: Jose Joseph WUJ:[id number] DOB: [birth year] DOA: 12/26/2023  DOS: the patient was seen and examined on 12/29/2023  Brief Hospital Course: Patient with PMH of multiple colon cancer with metastasis to sternum and ribs undergoing radiation therapy, COPD/asthma, depression, former smoker, obesity presented to the hospital with complaints of shortness of breath and abnormal echocardiogram. Shortness of breath specifically occurring on exertion ongoing for 2 weeks. Found to have large pericardial effusion without tamponade, A-fib with RVR and pleural effusion. Cardiology, medical oncology and IR were consulted. CT surgery also reviewed patient's echocardiogram. Currently plan is for conservative management.  Repeat echocardiogram on 5/17.  Assessment and Plan: Large pericardial effusion. Currently no tamponade physiology. Underwent outpatient echocardiogram before initiation of chemotherapy and with new onset hypertension and was found to have large circumferential pericardial effusion. Was recommended to go to ER for further workup. Seen by cardiology and CT surgery as well as interventional cardiology. CT surgery will consider pericardial window creation if patient develops tamponade. Repeat echocardiogram on 5/17 ordered.  Final plan after the repeat echocardiogram.  New onset A-fib with RVR: After admission patient was found to be in RVR. No prior history. Started on Cardizem bolus followed by Cardizem infusion. Remains in RVR and therefore metoprolol was added as well. CHADVASC score is relatively high. Cardiology recommended to initiate the patient on IV heparin . IV heparin  was initiated without a bolus given risk for worsening of his pericardial effusion. Intermittently rate fluctuates between 9240. IV Lopressor as needed added. Lopressor dose increased. Cardizem on long-term will have significant interaction with his chemotherapy regimen  therefore trying to avoid it. Amiodarone will also have significant interaction for prolonged QT with his chemotherapy and therefore help to avoid that as well. Rate appears to be well-controlled overnight. Management per cardiology.   Bilateral small to moderate pleural effusion: Small to moderate effusion noted on CT of chest. IR performed left pleural effusion thoracentesis.  600 mL removed. No hemorrhage in the fluid.  LDH elevated meeting criteria for exudative effusion.  Follow-up on cytology.  Less likely infection.  COPD with exacerbation with bronchitis Reporting 2 weeks of progressively worsening shortness of breath and cough. Has intermittent wheezing on exam. Uses inhalers at home. Was started on IV antibiotic. Procalcitonin is negative. Switch antibiotics to oral. Steroids added. Continue Mucinex and cough supportive medication. Switching inhalers to nebulizer therapy for now monitor response. Too early for radiation pneumonitis   Malignant neoplasm of upper lobe of right lung stage IV metastatic to ribs and sternum Patient currently receiving XRT with Dr. Jeryl Moris who was notified about patient's admission Oncology consult appreciated. Plan is to initiate systemic chemotherapy Multiple drug interaction.  Unable to use Cardizem, amiodarone as well as DOAC's.   Hypertensive urgency On admission blood pressure was 167/109 with heart rate in 120s  After Cardizem blood pressure improved. Was actually hypotensive on 5/14 evening.  Requiring IV fluid bolus. Currently we keep IV fluid for now until cardiology decides on the n.p.o. status. Reduce the rate since blood pressure is improved. Defer to cardiology with regards to Cardizem and metoprolol.  Obesity Class 1 Body mass index is 33.89 kg/m.  Placing the pt at higher risk of poor outcomes.  Mild hypokalemia. Replaced.  Acute diastolic CHF. Mild.  Has some swelling in his lower EXTR. Echocardiogram EF 55 to 60%.   Grade 1 diastolic dysfunction.  Large circumferential pericardial effusion. As needed IV Lasix right now.   Subjective: No nausea no vomiting.  Breathing  better.  No chest pain.  Physical Exam: General: in Mild distress, No Rash Cardiovascular: S1 and S2 Present, No Murmur Respiratory: Good respiratory effort, Bilateral Air entry present. No Crackles, No wheezes Abdomen: Bowel Sound present, No tenderness Extremities: Bilateral edema Neuro: Alert and oriented x3, no new focal deficit  Data Reviewed: I have Reviewed nursing notes, Vitals, and Lab results. Since last encounter, pertinent lab results CBC and BMP   . I have ordered test including CBC and BMP  . I have discussed pt's care plan and test results with cardiology  .  Ordered echocardiogram and chest x-ray  Disposition: Status is: Inpatient Remains inpatient appropriate because: Monitor for improvement in respiratory status and rate control  Place and maintain sequential compression device Start: 12/29/23 0855 Place TED hose Start: 12/29/23 0855   Family Communication: Family at bedside Level of care: Progressive   Vitals:   12/29/23 0714 12/29/23 0935 12/29/23 1100 12/29/23 1555  BP: 104/83 103/68 106/80 101/68  Pulse: (!) 116 (!) 112 87 (!) 111  Resp: 20  (!) 24 19  Temp: 97.9 F (36.6 C)  98.1 F (36.7 C) 97.7 F (36.5 C)  TempSrc: Oral  Oral Oral  SpO2: 95%  95% 96%  Weight:      Height:         Author: Charlean Congress, MD 12/29/2023 5:45 PM  Please look on [email or web address] to find out who is on call.

## 2023-12-29 NOTE — Progress Notes (Signed)
 Mobility Specialist Progress Note;   12/29/23 0956  Mobility  Activity Ambulated with assistance in hallway  Level of Assistance Contact guard assist, steadying assist  Assistive Device None  Distance Ambulated (ft) 100 ft  Activity Response Tolerated well  Mobility Referral Yes  Mobility visit 1 Mobility  Mobility Specialist Start Time (ACUTE ONLY) 0956  Mobility Specialist Stop Time (ACUTE ONLY) 1007  Mobility Specialist Time Calculation (min) (ACUTE ONLY) 11 min   Pt agreeable to mobility. On 3LO2 upon arrival. Required MinG assistance during ambulation for safety. Ambulated on 3LO2, SPO2 90%> throughout. HR up to 140 bpm w/ activity. Took 1x standing rest break d/t fatigue. Pt returned safely back to bed with all needs met. Wife in room.   Janit Meline Mobility Specialist Please contact via SecureChat or [employer] [phone]

## 2023-12-29 NOTE — Progress Notes (Signed)
 IP PROGRESS NOTE  Subjective:   Jose Joseph reports improvement in dyspnea following the thoracentesis yesterday.  He is able to sleep in the bed last night.  He has mild discomfort at the right low anterolateral chest wall.  He started encorafenib  and binimetinib  yesterday.  No apparent side effects. Objective: Vital signs in last 24 hours: Blood pressure 106/80, pulse 87, temperature 98.1 F (36.7 C), temperature source Oral, resp. rate (!) 24, height 6\' 1"  (1.854 m), weight 256 lb 13.4 oz (116.5 kg), SpO2 95%.  Intake/Output from previous day: 05/15 0701 - 05/16 0700 In: 1761.5 [P.O.:717; I.V.:694.5; IV Piggyback:350] Out: 350 [Urine:350]  Physical Exam: HEENT: No thrush Lungs: Distant breath sounds, no respiratory distress Cardiac: Irregular Abdomen: Nontender, no hepatomegaly EXTR: Trace lower leg edema bilaterally   Portacath/PICC-without erythema  Lab Results: Recent Labs    12/28/23 0159 12/29/23 0536  WBC 13.2* 11.2*  HGB 13.8 13.0  HCT 41.3 39.6  PLT 181 168    BMET Recent Labs    12/28/23 0159 12/29/23 0536  NA 141 138  K 3.5 3.9  CL 103 105  CO2 26 25  GLUCOSE 118* 108*  BUN 26* 34*  CREATININE 1.05 1.10  CALCIUM 9.4 8.8*    No results found for: "CEA1", "CEA", "ZOX096", "CA125"  Studies/Results: DG Chest 1 View Result Date: 12/28/2023 CLINICAL DATA:  Status post thoracentesis. EXAM: CHEST  1 VIEW COMPARISON:  Dec 26, 2023. FINDINGS: Stable cardiomediastinal silhouette. Right internal jugular Port-A-Cath is unchanged. Stable right perihilar opacity is noted concerning for atelectasis and possible underlying neoplasm. No pneumothorax status post thoracentesis. Minimal left basilar subsegmental atelectasis. Bony thorax is unremarkable. IMPRESSION: Stable right perihilar opacity is noted concerning for atelectasis and possible underlying neoplasm. No pneumothorax status post thoracentesis. Electronically Signed   By: Rosalene Colon M.D.   On: 12/28/2023  08:59   IR THORACENTESIS ASP PLEURAL SPACE W/IMG GUIDE Result Date: 12/28/2023 INDICATION: 68 year old male with left pleural effusion for diagnostic and therapeutic thoracentesis. EXAM: ULTRASOUND GUIDED LEFT THORACENTESIS MEDICATIONS: 10 mL 1% lidocaine  COMPLICATIONS: None immediate. PROCEDURE: An ultrasound guided thoracentesis was thoroughly discussed with the patient and questions answered. The benefits, risks, alternatives and complications were also discussed. The patient understands and wishes to proceed with the procedure. Written consent was obtained. Ultrasound was performed to localize and mark an adequate pocket of fluid in the left chest. The area was then prepped and draped in the normal sterile fashion. 1% Lidocaine  was used for local anesthesia. Under ultrasound guidance a 6 Fr Safe-T-Centesis catheter was introduced. Thoracentesis was performed. The catheter was removed and a dressing applied. FINDINGS: A total of approximately 600 mL of clear, yellow fluid was removed. Samples were sent to the laboratory as requested by the clinical team. IMPRESSION: Successful ultrasound guided left thoracentesis yielding 600 mL of pleural fluid. Performed By Lorinda Root, PA-C Electronically Signed   By: Erica Hau M.D.   On: 12/28/2023 08:46   CT HEAD WO CONTRAST ( ) Result Date: 12/27/2023 CLINICAL DATA:  Stroke, follow up repeat head CT to ensure no further strokes prior to starting [facility] for a fib/rvr EXAM: CT HEAD WITHOUT CONTRAST TECHNIQUE: Contiguous axial images were obtained from the base of the skull through the vertex without intravenous contrast. RADIATION DOSE REDUCTION: This exam was performed according to the departmental dose-optimization program which includes automated exposure control, adjustment of the mA and/or kV according to patient size and/or use of iterative reconstruction technique. COMPARISON:  12/06/2023 FINDINGS: Brain:  Previously seen small acute to subacute infarcts  on prior MRI not appreciable by CT. No acute intracranial abnormality. Specifically, no hemorrhage, hydrocephalus, mass lesion, acute infarction, or significant intracranial injury. Vascular: No hyperdense vessel or unexpected calcification. Skull: No acute calvarial abnormality. Sinuses/Orbits: No acute findings Other: None IMPRESSION: No acute intracranial abnormality. Electronically Signed   By: Janeece Mechanic M.D.   On: 12/27/2023 14:28    Medications: I have reviewed the patient's current medications.  Assessment/Plan:  Stage IV non-small cell lung cancer 10/19/2023 PET-bilateral lung nodules, dominant right upper lobe spiculated lesion, by lateral hilar and mediastinal lymphadenopathy, right supraclavicular lymphadenopathy, hypermetabolic bone lesions at the sternum, left sixth rib, and right iliac Bronchoscopy/biopsy 11/21/2023-right upper lobe nodule and level 4R/11R lymph nodes-adenocarcinoma, BRAF V600 E, PD-L1 TPS 95% 12/06/2023: MRI brain-small foci of diffusion signal abnormality without corresponding enhancement, likely acute/subacute infarcts Scheduled to begin encorafenib  and binimetinib  01/01/2024 Palliative radiation to the lung, sternum, and ribs beginning 12/15/2023 CT chest 12/26/2023: Enlarged left supraclavicular node, significant enlargement of lymph nodes at the left mediastinum/AP window, left internal mammary nodes and bilateral hilar nodes, groundglass airspace disease in the bilateral lungs, worsening right middle lobe consolidation, progressive bone metastases, new right adrenal nodule, small right and small to moderate left pleural effusion, moderate pericardial effusion   2.  Circumferential pericardial effusion on echocardiogram 12/26/2023 3.  Cough/dyspnea secondary to #1-probable components of effusions and airspace disease Left thoracentesis 12/28/2023    Jose Joseph has metastatic non-small cell lung cancer.  He underwent a thoracentesis yesterday.  Dyspnea has  improved.  He began treatment with encorafenib  and binimetinib  yesterday, but there are significant drug interactions between these agents and his planned anticoagulation/cardiac regimen.  Treatment will be changed to dabrafenib and trametinib at discharge.  Orders were placed for these drugs yesterday.  I discussed this regimen and potential toxicities with Jose Joseph.  He agrees to proceed.   The dyspnea is likely multifactorial with atrial fibrillation, the pericardial effusion, COPD, and airspace disease contributing. Decision on draining the pericardial fluid per cardiology  Recommendations: Management of atrial fibrillation per the medical and cardiology services Continue encorafenib /binimetinib  in the hospital, plan for a change to dabrafenib and trametinib at discharge, monitor for adverse side effects by pharmacy while hospitalized Management of pericardial effusion per cardiology Continue antibiotics Please call oncology as needed, I will check on him 01/01/2024, outpatient follow-up will be scheduled with Dr. Cheree Cords   LOS: 3 days   Coni Deep, MD   12/29/2023, 1:17 PM

## 2023-12-29 NOTE — Plan of Care (Signed)
  Problem: Education: Goal: Knowledge of General Education information will improve Description: Including pain rating scale, medication(s)/side effects and non-pharmacologic comfort measures Outcome: Progressing   Problem: Clinical Measurements: Goal: Respiratory complications will improve Outcome: Progressing Goal: Cardiovascular complication will be avoided Outcome: Progressing   Problem: Activity: Goal: Risk for activity intolerance will decrease Outcome: Progressing   Problem: Skin Integrity: Goal: Risk for impaired skin integrity will decrease Outcome: Progressing

## 2023-12-29 NOTE — Plan of Care (Signed)
     Referral received for Jose Joseph: goals of care discussion. Chart reviewed and updates received from Dr. Lydia Sams. Patient assessed. Patient would like his wife Ronny Colas present for goals of care discussions as she is a Engineer, civil (consulting).   I was able to speak with patient's wife Ronny Colas. GOC meeting scheduled for 12/30/2023 @ 0830. Family is aware we will meet at patient's bedside.   Detailed note and recommendations to follow once GOC has been completed.   Thank you for your referral and allowing PMT to assist in [name]'s care.   Joaquim Muir, NP Palliative Medicine Team  Team Phone # [phone]   NO CHARGE

## 2023-12-30 ENCOUNTER — Inpatient Hospital Stay (HOSPITAL_COMMUNITY)

## 2023-12-30 DIAGNOSIS — Z7189 Other specified counseling: Secondary | ICD-10-CM | POA: Diagnosis not present

## 2023-12-30 DIAGNOSIS — I3139 Other pericardial effusion (noninflammatory): Secondary | ICD-10-CM | POA: Diagnosis not present

## 2023-12-30 DIAGNOSIS — Z515 Encounter for palliative care: Secondary | ICD-10-CM | POA: Diagnosis not present

## 2023-12-30 DIAGNOSIS — I4891 Unspecified atrial fibrillation: Secondary | ICD-10-CM | POA: Diagnosis not present

## 2023-12-30 DIAGNOSIS — I358 Other nonrheumatic aortic valve disorders: Secondary | ICD-10-CM

## 2023-12-30 LAB — BASIC METABOLIC PANEL WITH GFR
Anion gap: 10 (ref 5–15)
BUN: 36 mg/dL — ABNORMAL HIGH (ref 8–23)
CO2: 24 mmol/L (ref 22–32)
Calcium: 8.2 mg/dL — ABNORMAL LOW (ref 8.9–10.3)
Chloride: 102 mmol/L (ref 98–111)
Creatinine, Ser: 1.19 mg/dL (ref 0.61–1.24)
GFR, Estimated: >60 mL/min (ref >60)
Glucose, Bld: 105 mg/dL — ABNORMAL HIGH (ref 70–99)
Potassium: 3.2 mmol/L — ABNORMAL LOW (ref 3.5–5.1)
Sodium: 136 mmol/L (ref 135–145)

## 2023-12-30 LAB — CBC
HCT: 37.3 % — ABNORMAL LOW (ref 39.0–52.0)
Hemoglobin: 12.4 g/dL — ABNORMAL LOW (ref 13.0–17.0)
MCH: 30.6 pg (ref 26.0–34.0)
MCHC: 33.2 g/dL (ref 30.0–36.0)
MCV: 92.1 fL (ref 80.0–100.0)
Platelets: 186 10*3/uL (ref 150–400)
RBC: 4.05 MIL/uL — ABNORMAL LOW (ref 4.22–5.81)
RDW: 13.1 % (ref 11.5–15.5)
WBC: 8.3 10*3/uL (ref 4.0–10.5)
nRBC: 0 % (ref 0.0–0.2)

## 2023-12-30 LAB — ECHOCARDIOGRAM LIMITED
Height: 73 in
Weight: 4109.37 [oz_av]

## 2023-12-30 LAB — MAGNESIUM: Magnesium: 2.2 mg/dL (ref 1.7–2.4)

## 2023-12-30 LAB — HEPARIN LEVEL (UNFRACTIONATED): Heparin Unfractionated: 0.41 [IU]/mL (ref 0.30–0.70)

## 2023-12-30 MED ORDER — POTASSIUM CHLORIDE CRYS ER 20 MEQ PO TBCR
40.0000 meq | EXTENDED_RELEASE_TABLET | ORAL | Status: AC
  Administered 2023-12-30 (×2): 40 meq via ORAL
  Filled 2023-12-30 (×2): qty 2

## 2023-12-30 NOTE — Progress Notes (Signed)
 PHARMACY - ANTICOAGULATION Pharmacy Consult for heparin   Indication: atrial fibrillation   Allergies  Allergen Reactions   Bee Venom Anaphylaxis    syncope    Patient Measurements: Height: 6\' 1"  (185.4 cm) Weight: 116.5 kg (256 lb 13.4 oz) IBW/kg (Calculated) : 79.9 HEPARIN  DW (KG): 104.9  Vital Signs: Temp: 97.7 F (36.5 C) (05/17 0344) Temp Source: Oral (05/17 0344) BP: 96/66 (05/17 0344) Pulse Rate: 100 (05/17 0344)  Labs: Recent Labs    12/27/23 1809 12/28/23 0159 12/28/23 1200 12/28/23 1954 12/29/23 0536 12/30/23 0530  HGB 14.4 13.8  --   --  13.0 12.4*  HCT 42.6 41.3  --   --  39.6 37.3*  PLT 179 181  --   --  168 186  APTT 29  --   --   --   --   --   HEPARINUNFRC <0.10* 0.21*   < > 0.32 0.41 0.41  CREATININE 1.02 1.05  --   --  1.10  --    < > = values in this interval not displayed.    Estimated Creatinine Clearance: 84.7 mL/min (by C-G formula based on SCr of 1.1 mg/dL).   Assessment: 68 yo M with PMG stage IV metastatic lung cancer with afib + RVR. Pt also with small stroke burden from stroke in April 2025- likely embolic per neuro. No anticoag PTA. Ok to start anticoagulation per neuro, cardiology, and [facility] primary. Pharmacy consulted to start heparin  infusion (no bolus).  Heparin  level is within goal range at 0.41 on 1550 units/hr. CBC is stable. No known issues with IV infusion, no overt bleeding or complications noted.  Goal of Therapy:  Heparin  level 0.3-0.7 units/ml Monitor platelets by anticoagulation protocol: Yes   Plan:  Continue IV heparin  at current rate of 1550 units/hr Daily heparin  level and CBC Monitor for s/sx of bleeding  F/u plans for oral anticoagulation eventually.  There does appear to be significant drug interaction between encorafenib  and Xarelto/Eliquis, will likely need to use lovenox 1 mg/kg q 12 hrs until plans for alternative chemo therapy can be made.  Thank you for involving pharmacy in this patient's  care.  Albino Alu, PharmD PGY2 Cardiology Pharmacy Resident  12/30/2023 6:23 AM   [facility] pharmacy phone numbers are listed on [email or web address]

## 2023-12-30 NOTE — Progress Notes (Signed)
 Mobility Specialist: Progress Note   12/30/23 1538  Mobility  Activity Ambulated with assistance in hallway  Level of Assistance Standby assist, set-up cues, supervision of patient - no hands on  Assistive Device None  Distance Ambulated (ft) 150 ft  Activity Response Tolerated well  Mobility Referral Yes  Mobility visit 1 Mobility  Mobility Specialist Start Time (ACUTE ONLY) 1355  Mobility Specialist Stop Time (ACUTE ONLY) 1412  Mobility Specialist Time Calculation (min) (ACUTE ONLY) 17 min    During Mobility: SpO2 95-96% 2LO2, HR 115  Pt was pleasant and agreeable to mobility session - received in bed. Feeling much better today compared to other days. SV throughout. Took 1x standing break d/t fatigue and SOB. SpO2 95% 2LO2. Returned to room without fault. Left on EOB with all needs met, call bell in reach.   Deloria Fetch Mobility Specialist Please contact via SecureChat or Rehab office at [phone]

## 2023-12-30 NOTE — Progress Notes (Signed)
 Progress Note  Patient Name: ISSAAC Joseph Date of Encounter: 12/30/2023  Primary Cardiologist: Olinda Bertrand, DO  Subjective   SOB improved.  Telemetry reviewed, in atrial fibrillation, rates controlled last night in 70s, this morning ranges between 90 and 100s.  Inpatient Medications    Scheduled Meds:  arformoterol   15 mcg Nebulization BID   benzonatate   100 mg Oral TID   binimetinib   45 mg Oral BID   budesonide  (PULMICORT ) nebulizer solution  0.25 mg Nebulization BID   cefadroxil   500 mg Oral BID   Chlorhexidine  Gluconate Cloth  6 each Topical Daily   citalopram   20 mg Oral q AM   encorafenib   450 mg Oral Daily   guaiFENesin   1,200 mg Oral BID   loratadine   10 mg Oral Daily   metoprolol  tartrate  50 mg Oral BID   [START ON 12/31/2023] predniSONE   20 mg Oral Q breakfast   Followed by   Cecily Cohen ON 01/02/2024] predniSONE   10 mg Oral Q breakfast   revefenacin   175 mcg Nebulization Daily   sodium chloride  flush  3 mL Intravenous Q12H   Continuous Infusions:  diltiazem  (CARDIZEM ) infusion 5 mg/hr (12/30/23 0940)   heparin  1,550 Units/hr (12/30/23 0600)   PRN Meds: acetaminophen  **OR** acetaminophen , HYDROmorphone  (DILAUDID ) injection, levalbuterol , metoprolol  tartrate, ondansetron  **OR** ondansetron  (ZOFRAN ) IV, oxyCODONE , polyethylene glycol, sodium chloride  flush   Vital Signs    Vitals:   12/30/23 0758 12/30/23 0820 12/30/23 0934 12/30/23 1100  BP: 99/69  113/76   Pulse: 99 99 (!) 104   Resp: 17 18  20   Temp: 97.6 F (36.4 C)     TempSrc: Oral   Axillary  SpO2:    94%  Weight:      Height:        Intake/Output Summary (Last 24 hours) at 12/30/2023 1142 Last data filed at 12/30/2023 1059 Gross per 24 hour  Intake 1249.08 ml  Output 1825 ml  Net -575.92 ml   Filed Weights   12/26/23 1020 12/27/23 1633  Weight: 118.4 kg 116.5 kg    Telemetry     Personally reviewed.  In atrial fibrillation.  ECG    Not performed today.  Physical Exam   GEN: No  acute distress.   Neck: Not examined due to body habitus. Cardiac: RRR, no murmur, rub, or gallop.  Respiratory: Nonlabored. Clear to auscultation bilaterally. GI: Soft, nontender, bowel sounds present. MS: No edema; No deformity. Neuro:  Nonfocal. Psych: Alert and oriented x 3. Normal affect.  Labs    Chemistry Recent Labs  Lab 12/27/23 1809 12/28/23 0159 12/29/23 0536 12/30/23 0530  NA 137 141 138 136  K 3.4* 3.5 3.9 3.2*  CL 101 103 105 102  CO2 25 26 25 24   GLUCOSE 115* 118* 108* 105*  BUN 16 26* 34* 36*  CREATININE 1.02 1.05 1.10 1.19  CALCIUM 9.2 9.4 8.8* 8.2*  PROT 6.6  --   --   --   ALBUMIN 3.1*  --   --   --   AST 17  --   --   --   ALT 22  --   --   --   ALKPHOS 57  --   --   --   BILITOT 0.9  --   --   --   GFRNONAA >60 >60 >60 >60  ANIONGAP 11 12 8 10      Hematology Recent Labs  Lab 12/28/23 0159 12/29/23 0536 12/30/23 0530  WBC 13.2* 11.2*  8.3  RBC 4.50 4.27 4.05*  HGB 13.8 13.0 12.4*  HCT 41.3 39.6 37.3*  MCV 91.8 92.7 92.1  MCH 30.7 30.4 30.6  MCHC 33.4 32.8 33.2  RDW 12.8 12.7 13.1  PLT 181 168 186    Cardiac Enzymes Recent Labs  Lab 12/26/23 1026 12/26/23 1314 12/26/23 2325 12/27/23 0408  TROPONINIHS 57* 57* 53* 48*    BNP Recent Labs  Lab 12/27/23 0408  BNP 71.4     DDimerNo results for input(s): "DDIMER" in the last 168 hours.   Radiology    No results found.   Assessment & Plan   Moderate to large pericardial effusion: SOB improved compared to prior. IC rec pericardial window. CTS rec pericardiocentesis.  Technically difficult in obtaining pericardial window given his metastatic cancer. Ongoing discussions. Will follow-up on the limited echocardiogram to rule out any tamponade physiology.  Atrial fibrillation with RVR, new onset: On diltiazem  drip 5 mg/h, will increase to 7.5 mg/h.  Telemetry reviewed, in atrial fibrillation with RVR, 90-100s.  Rates controlled last night, in 70s.  Continue heparin  drip.  Upon  discharge, recommend subcutaneous Lovenox rather than DOAC per oncology and pharmacy.   Signed, Lasalle Pointer, MD  12/30/2023, 11:42 AM

## 2023-12-30 NOTE — Progress Notes (Signed)
 [facility] Progress Note Patient: Jose Joseph IHK:[id number] DOB: 1956/01/03 DOA: 12/26/2023  DOS: the patient was seen and examined on 12/30/2023  Brief Hospital Course: Patient with PMH of multiple colon cancer with metastasis to sternum and ribs undergoing radiation therapy, COPD/asthma, depression, former smoker, obesity presented to the hospital with complaints of shortness of breath and abnormal echocardiogram. Shortness of breath specifically occurring on exertion ongoing for 2 weeks. Found to have large pericardial effusion without tamponade, A-fib with RVR and pleural effusion. Cardiology, medical oncology and IR were consulted. CT surgery also reviewed patient's echocardiogram. Currently plan is for conservative management.  Repeat echocardiogram on 5/17.  Awaiting results.  Assessment and Plan: Large pericardial effusion. Currently no tamponade physiology. Underwent outpatient echocardiogram before initiation of chemotherapy and with new onset hypertension and was found to have large circumferential pericardial effusion. Was recommended to go to ER for further workup. Seen by cardiology and CT surgery as well as interventional cardiology. Repeat echocardiogram on 5/17 ordered.  Final plan after the repeat echocardiogram. See recommendation and note from cardiology.  New onset A-fib with RVR: After admission patient was found to be in RVR. No prior history. Started on Cardizem  bolus followed by Cardizem  infusion. Remains in RVR and therefore metoprolol  was added as well. CHADVASC score is relatively high. Currently on IV heparin . On IV Cardizem . IV Lopressor  as needed. Oral Lopressor . Cardizem  and amiodarone will not be an option long-term given interaction with chemotherapy. DOACs will not be an option for long-term given interaction with his chemotherapy. Lovenox for stroke prevention once stable for transition. Will discuss with pharmacy once a day regimen is  appropriate for the patient. Patient's wife is an [employer] fundraiser who can provide the injections. Management per cardiology.   Bilateral small to moderate pleural effusion: Small to moderate effusion noted on CT of chest. IR performed left pleural effusion thoracentesis.  600 mL removed. No hemorrhage in the fluid.  LDH elevated meeting criteria for exudative effusion.  Follow-up on cytology.  Less likely infection.  COPD with exacerbation with bronchitis Reporting 2 weeks of progressively worsening shortness of breath and cough. Uses inhalers at home. Was started on IV antibiotic. Procalcitonin is negative. Too early for radiation pneumonitis.  Wheezing appears to be improved. Continue steroids antibiotic Mucinex  and other medications for cough. Currently receiving Nebulizer therapy.   Malignant neoplasm of upper lobe of right lung stage IV metastatic to ribs and sternum Patient currently receiving XRT with Dr. Jeryl Moris who was notified about patient's admission Oncology consult appreciated. Plan is to initiate systemic chemotherapy Multiple drug interaction.  Unable to use Cardizem , amiodarone as well as DOAC's.   Hypertensive urgency On admission blood pressure was 167/109 with heart rate in 120s  Currently blood pressure significantly better.  Obesity Class 1 Body mass index is 33.89 kg/m.  Placing the pt at higher risk of poor outcomes.  Mild hypokalemia. Replaced.  Acute diastolic CHF. Mild.  Has some swelling in his lower EXTR. Echocardiogram EF 55 to 60%.   Grade 1 diastolic dysfunction.  Large circumferential pericardial effusion. As needed IV Lasix  right now.   Subjective: No nausea no vomiting no fever no chills.  Breathing okay.  Cough better.  Physical Exam: In mild distress. No rash. S1-S2 present.  Irregular no murmur. Basal crackles. No wheezing. No edema.  Data Reviewed: I have Reviewed nursing notes, Vitals, and Lab results. Since last encounter, pertinent lab  results CBC and BMP   . I have ordered test including CBC and  BMP  . I have discussed pt's care plan and test results with palliative care  .   Disposition: Status is: Inpatient Remains inpatient appropriate because: Monitor for improvement in respiratory status and further workup per cardiology  Place and maintain sequential compression device Start: 12/29/23 0855 Place TED hose Start: 12/29/23 0855   Family Communication: No one at bedside Level of care: Progressive   Vitals:   12/30/23 0758 12/30/23 0820 12/30/23 0934 12/30/23 1100  BP: 99/69  113/76   Pulse: 99 99 (!) 104   Resp: 17 18  20   Temp: 97.6 F (36.4 C)     TempSrc: Oral   Axillary  SpO2:    94%  Weight:      Height:         Author: Charlean Congress, MD 12/30/2023 2:20 PM  Please look on [email or web address] to find out who is on call.

## 2023-12-30 NOTE — Progress Notes (Signed)
  Echocardiogram 2D Echocardiogram has been performed.  Jose Joseph 12/30/2023, 4:20 PM

## 2023-12-30 NOTE — Consult Note (Signed)
 Palliative Care Consult Note                                  Date: 12/30/2023   Patient Name: Jose Joseph  DOB: [birth year]  MRN: [id number]  Age / Sex: 68 y.o., male  PCP: Artemisa Bile, MD Referring Physician: Kraig Peru, MD  Reason for Consultation: Establishing goals of care  HPI/Patient Profile: 68 y.o. male  with past medical history of stage IV non-small cell lung cancer with metastasis to sternum and ribs (undergoing radiation therapy), COPD, depression, and HTN admitted on 12/26/2023 after he had an echo which showed a large pericardial effusion without tamponade. Patient had two weeks of progressive shortening of breath, chest tightness, and dyspnea on exertion.   Patient found to be in A-fib with RVR. Patient had 600 mL removed left thoracentesis.  Past Medical History:  Diagnosis Date   Arthritis    Asthma    Atrial fibrillation (HCC)    Cancer (HCC)    Complication of anesthesia    after his neck surgery pt. trying to get up and walk out   COPD (chronic obstructive pulmonary disease) (HCC)    Depression    History of kidney stones    2021   Hypertension    Metastatic lung carcinoma, right (HCC) 08/2023   Stroke [facility])     Subjective:   I have reviewed medical records including EPIC notes, labs and imaging, received update from Dr. Lydia Sams, assessed the patient and then met with the patient and his wife Ronny Colas to discuss diagnosis prognosis, GOC, EOL wishes, disposition and options.  I introduced Palliative Medicine as specialized medical care for people living with serious illness. It focuses on providing relief from symptoms and stress of a serious illness. The goal is to improve quality of life for both the patient and the family.  Today's Discussion: Patient shared his understanding of his chronic conditions and current hospitalization. Dr. Lydia Sams came in and gave patient an update. The patient's a-fib was  controlled overnight. Patient reported his breathing has improved during this hospitalization and since the thoracentesis. Plan is for patient to have another echocardiogram to monitor pericardial effusion. Cancer therapy has been adjusted due to significant interactions between these agents and planned anticoagulation/cardiac medications. Patient shares he understands that he has a lot going on. He also shared he is encouraged by the prognosis given to him by oncology.  We discussed the importance of advanced directives. Patient would like to complete HCPOA document while hospitalized. He would like his wife and son to be decision makers. Spiritual care consult placed. We discussed code status. Recommended consideration of DNR status, understanding evidenced-based poor outcomes in similar hospitalized patients, as the cause of the arrest is likely associated with chronic/terminal disease rather than a reversible acute cardio-pulmonary event. Patient would like to remain full code. We discussed scopes of care. I shared my concern around intubation considering the patient's lung cancer and COPD. Patient would like to remain full scope of care. The patient shared he wants to do what is necessary to live-- unless he is "debilitated." I asked him to expand on what he meant by debilitated and he eventually said brain dead. I encouraged him to further consider what quality of life would be acceptable to him in the future as this can help guide his family if they are ever decision makers for him.  Patient shared that  Ronny Colas brings him joy. Ronny Colas is from [place] and they have spent several winters in [place]. The patient has one son and Ronny Colas has three. Prior to admission the patient lived with Ronny Colas and her disabled son. He was able to complete ADLs independently but required additional time. In the weeks leading up to this hospitalization his appetite was not good but it has improved since he has been  here.  Discussed the importance of continued conversation with family and the medical providers regarding overall plan of care and treatment options, ensuring decisions are within the context of the patient's values and GOCs.  Questions and concerns were addressed. Hard Choices booklet left for review. The patient and family were encouraged to call with questions or concerns. PMT will continue to support holistically.  Review of Systems  Constitutional:  Positive for appetite change, fatigue and unexpected weight change.  Respiratory:  Positive for shortness of breath.     Objective:   Primary Diagnoses: Present on Admission:  Dyspnea on exertion  Pericardial effusion  Lung cancer metastatic to bone [facility])  Chronic obstructive pulmonary disease (HCC)  Malignant neoplasm of upper lobe of right lung [facility])   Physical Exam Vitals reviewed.  Constitutional:      General: He is not in acute distress. HENT:     Head: Normocephalic and atraumatic.  Cardiovascular:     Rate and Rhythm: Tachycardia present.  Pulmonary:     Effort: Pulmonary effort is normal.  Neurological:     Mental Status: He is alert and oriented to person, place, and time.  Psychiatric:        Mood and Affect: Mood normal.        Behavior: Behavior normal.     Vital Signs:  BP 99/69 (BP Location: Right Arm)   Pulse 99   Temp 97.6 F (36.4 C) (Oral)   Resp 18   Ht 6\' 1"  (1.854 m)   Wt 116.5 kg   SpO2 97%   BMI 33.89 kg/m   Palliative Assessment/Data: 70%    Advanced Care Planning:   Existing Vynca/ACP Documentation: None  Primary Decision Maker: PATIENT  Code Status/Advance Care Planning: Full code  Assessment & Plan:   SUMMARY OF RECOMMENDATIONS   Full code Full scope Encouraged patient to continue discussions re: goals of care with family Spiritual care consult for HCPOA completion Continued PMT support  Discussed with: Dr. Lydia Sams  Time Total: 75 minutes    Thank you for  allowing us  to participate in the care of Orlinda Blackbird PMT will continue to support holistically.  Signed by: Joaquim Muir, NP Palliative Medicine Team  Team Phone # [phone] (Nights/Weekends)  12/30/2023, 9:29 AM

## 2023-12-31 DIAGNOSIS — Z7189 Other specified counseling: Secondary | ICD-10-CM | POA: Diagnosis not present

## 2023-12-31 DIAGNOSIS — I4891 Unspecified atrial fibrillation: Secondary | ICD-10-CM

## 2023-12-31 DIAGNOSIS — C3411 Malignant neoplasm of upper lobe, right bronchus or lung: Secondary | ICD-10-CM

## 2023-12-31 DIAGNOSIS — Z515 Encounter for palliative care: Secondary | ICD-10-CM | POA: Diagnosis not present

## 2023-12-31 DIAGNOSIS — I3139 Other pericardial effusion (noninflammatory): Secondary | ICD-10-CM | POA: Diagnosis not present

## 2023-12-31 LAB — BASIC METABOLIC PANEL WITH GFR
Anion gap: 7 (ref 5–15)
BUN: 27 mg/dL — ABNORMAL HIGH (ref 8–23)
CO2: 26 mmol/L (ref 22–32)
Calcium: 8.3 mg/dL — ABNORMAL LOW (ref 8.9–10.3)
Chloride: 103 mmol/L (ref 98–111)
Creatinine, Ser: 1.2 mg/dL (ref 0.61–1.24)
GFR, Estimated: >60 mL/min (ref >60)
Glucose, Bld: 92 mg/dL (ref 70–99)
Potassium: 3.8 mmol/L (ref 3.5–5.1)
Sodium: 136 mmol/L (ref 135–145)

## 2023-12-31 LAB — CBC
HCT: 37.5 % — ABNORMAL LOW (ref 39.0–52.0)
Hemoglobin: 12.4 g/dL — ABNORMAL LOW (ref 13.0–17.0)
MCH: 30.3 pg (ref 26.0–34.0)
MCHC: 33.1 g/dL (ref 30.0–36.0)
MCV: 91.7 fL (ref 80.0–100.0)
Platelets: 187 10*3/uL (ref 150–400)
RBC: 4.09 MIL/uL — ABNORMAL LOW (ref 4.22–5.81)
RDW: 13.1 % (ref 11.5–15.5)
WBC: 6.6 10*3/uL (ref 4.0–10.5)
nRBC: 0 % (ref 0.0–0.2)

## 2023-12-31 LAB — BODY FLUID CULTURE W GRAM STAIN
Culture: NO GROWTH
Gram Stain: NONE SEEN

## 2023-12-31 LAB — CULTURE, BLOOD (ROUTINE X 2)
Culture: NO GROWTH
Culture: NO GROWTH

## 2023-12-31 LAB — HEPARIN LEVEL (UNFRACTIONATED): Heparin Unfractionated: 0.3 [IU]/mL (ref 0.30–0.70)

## 2023-12-31 LAB — MAGNESIUM: Magnesium: 2.3 mg/dL (ref 1.7–2.4)

## 2023-12-31 MED ORDER — SODIUM CHLORIDE 0.9% FLUSH: 3.0000 mL | INTRAVENOUS | Status: DC | PRN

## 2023-12-31 MED ORDER — SODIUM CHLORIDE 0.9% FLUSH
3.0000 mL | Freq: Two times a day (BID) | INTRAVENOUS | Status: DC
  Administered 2023-12-31: 3 mL via INTRAVENOUS

## 2023-12-31 NOTE — H&P (View-Only) (Signed)
 Progress Note  Patient Name: Jose Joseph Date of Encounter: 12/31/2023  Primary Cardiologist: Olinda Bertrand, DO  Subjective   SOB improved.  Telemetry reviewed, in A-fib, heart rate ranging from 90 to 100s.  When moving in bed, it is increasing up to 1 20-1 30s.  Currently on diltiazem  drip 7.5 mg/h.  Inpatient Medications    Scheduled Meds:  arformoterol   15 mcg Nebulization BID   benzonatate   100 mg Oral TID   binimetinib   45 mg Oral BID   budesonide  (PULMICORT ) nebulizer solution  0.25 mg Nebulization BID   Chlorhexidine  Gluconate Cloth  6 each Topical Daily   citalopram   20 mg Oral q AM   encorafenib   450 mg Oral Daily   guaiFENesin   1,200 mg Oral BID   loratadine   10 mg Oral Daily   metoprolol  tartrate  50 mg Oral BID   predniSONE   20 mg Oral Q breakfast   Followed by   Cecily Cohen ON 01/02/2024] predniSONE   10 mg Oral Q breakfast   revefenacin   175 mcg Nebulization Daily   sodium chloride  flush  3 mL Intravenous Q12H   Continuous Infusions:  diltiazem  (CARDIZEM ) infusion 7.5 mg/hr (12/31/23 0438)   heparin  1,550 Units/hr (12/31/23 0755)   PRN Meds: acetaminophen  **OR** acetaminophen , HYDROmorphone  (DILAUDID ) injection, levalbuterol , metoprolol  tartrate, ondansetron  **OR** ondansetron  (ZOFRAN ) IV, oxyCODONE , polyethylene glycol, sodium chloride  flush   Vital Signs    Vitals:   12/31/23 0454 12/31/23 0753 12/31/23 0801 12/31/23 0804  BP: 101/75  (!) 105/57 (!) 105/57  Pulse: 74 73  (!) 103  Resp: 16 20  15   Temp: 97.8 F (36.6 C)   97.6 F (36.4 C)  TempSrc: Oral   Oral  SpO2: 94%     Weight:      Height:        Intake/Output Summary (Last 24 hours) at 12/31/2023 0939 Last data filed at 12/31/2023 0805 Gross per 24 hour  Intake 1052.14 ml  Output 1650 ml  Net -597.86 ml   Filed Weights   12/26/23 1020 12/27/23 1633  Weight: 118.4 kg 116.5 kg    Telemetry     Personally reviewed.  In atrial fibrillation.  ECG    Not performed today.  Physical  Exam   GEN: No acute distress.   Neck: Not examined due to body habitus. Cardiac: RRR, no murmur, rub, or gallop.  Respiratory: Nonlabored. Clear to auscultation bilaterally. GI: Soft, nontender, bowel sounds present. MS: No edema; No deformity. Neuro:  Nonfocal. Psych: Alert and oriented x 3. Normal affect.  Labs    Chemistry Recent Labs  Lab 12/27/23 1809 12/28/23 0159 12/29/23 0536 12/30/23 0530 12/31/23 0445  NA 137   < > 138 136 136  K 3.4*   < > 3.9 3.2* 3.8  CL 101   < > 105 102 103  CO2 25   < > 25 24 26   GLUCOSE 115*   < > 108* 105* 92  BUN 16   < > 34* 36* 27*  CREATININE 1.02   < > 1.10 1.19 1.20  CALCIUM 9.2   < > 8.8* 8.2* 8.3*  PROT 6.6  --   --   --   --   ALBUMIN 3.1*  --   --   --   --   AST 17  --   --   --   --   ALT 22  --   --   --   --   [facility]  57  --   --   --   --   BILITOT 0.9  --   --   --   --   GFRNONAA >60   < > >60 >60 >60  ANIONGAP 11   < > 8 10 7    < > = values in this interval not displayed.     Hematology Recent Labs  Lab 12/29/23 0536 12/30/23 0530 12/31/23 0445  WBC 11.2* 8.3 6.6  RBC 4.27 4.05* 4.09*  HGB 13.0 12.4* 12.4*  HCT 39.6 37.3* 37.5*  MCV 92.7 92.1 91.7  MCH 30.4 30.6 30.3  MCHC 32.8 33.2 33.1  RDW 12.7 13.1 13.1  PLT 168 186 187    Cardiac Enzymes Recent Labs  Lab 12/26/23 1026 12/26/23 1314 12/26/23 2325 12/27/23 0408  TROPONINIHS 57* 57* 53* 48*    BNP Recent Labs  Lab 12/27/23 0408  BNP 71.4     DDimerNo results for input(s): "DDIMER" in the last 168 hours.   Radiology    ECHOCARDIOGRAM LIMITED Result Date: 12/30/2023    ECHOCARDIOGRAM LIMITED REPORT   Patient Name:   Jose Joseph Date of Exam: 12/30/2023 Medical Rec #:  [id number]     Height:       73.0 in Accession #:    [id number]    Weight:       256.8 lb Date of Birth:  February 17, 1956     BSA:          2.393 m Patient Age:    67 years      BP:           113/76 mmHg Patient Gender: M             HR:           91 bpm. Exam Location:   Inpatient Procedure: Limited Color Doppler, Cardiac Doppler and Limited Echo (Both            Spectral and Color Flow Doppler were utilized during procedure). Indications:    pericardial effusion  History:        Patient has prior history of Echocardiogram examinations, most                 recent 12/26/2023. COPD and lung cancer, Arrythmias:Atrial                 Fibrillation; Risk Factors:Hypertension and Former Smoker.  Sonographer:    Dione Franks RDCS Referring Phys: [id number] PRANAV M PATEL IMPRESSIONS  1. Left ventricular ejection fraction, by estimation, is 55 to 60%. The left ventricle has normal function.  2. A small pericardial effusion is present. The pericardial effusion is anterior to the right ventricle. There is no evidence of cardiac tamponade.  3. The aortic valve is tricuspid. There is mild calcification of the aortic valve. Aortic valve regurgitation is not visualized.  4. The inferior vena cava is dilated in size with >50% respiratory variability, suggesting right atrial pressure of 8 mmHg. Comparison(s): Prior echo: large effusion. FINDINGS  Left Ventricle: Left ventricular ejection fraction, by estimation, is 55 to 60%. The left ventricle has normal function. Pericardium: A small pericardial effusion is present. The pericardial effusion is anterior to the right ventricle. There is no evidence of cardiac tamponade. Tricuspid Valve: Tricuspid valve regurgitation is not demonstrated. Aortic Valve: The aortic valve is tricuspid. There is mild calcification of the aortic valve. Aortic valve regurgitation is not visualized. Venous: The inferior vena cava is dilated in size with greater than  50% respiratory variability, suggesting right atrial pressure of 8 mmHg. Additional Comments: Spectral Doppler performed. Color Doppler performed.  IVC IVC diam: 2.30 cm AORTIC VALVE LVOT Vmax:   85.20 cm/s LVOT Vmean:  57.500 cm/s LVOT VTI:    0.127 m  SHUNTS Systemic VTI: 0.13 m Dorothye Gathers MD Electronically  signed by Dorothye Gathers MD Signature Date/Time: 12/30/2023/4:34:03 PM    Final      Assessment & Plan   Moderate to large pericardial effusion: SOB significantly improved compared to admission. IC rec pericardial window. CTS rec pericardiocentesis. Technically difficult in obtaining pericardial window given his metastatic cancer. Limited echocardiogram yesterday showed small pericardial effusion.  Atrial fibrillation with RVR, new onset: On diltiazem  drip 7.5 mg/h.  Continues to be in atrial fibrillation with HR ranging from 90-100s, elevating to 120 and 130s with moving in the bed.  Keep n.p.o. after midnight for TEE guided DCCV tomorrow.  Patient agreeable to the plan.  Continue heparin  drip.  Upon discharge, recommend subcutaneous Lovenox started on DOAC per oncology and pharmacy given his cancer.  No contraindications to TEE or DCCV.  Informed consent for TEE to DCCV Risks and benefits of the procedure explained to the patient.  Risks include, but not limited to, infection, pneumonia, sore throat, esophageal rupture, skin erythema, cardiac arrest, possibly PPM dilation.  Patient comprehended these risks and agreed to proceed with the procedure.   Signed, Lasalle Pointer, MD  12/31/2023, 9:39 AM

## 2023-12-31 NOTE — Progress Notes (Signed)
 Mobility Specialist: Progress Note   12/31/23 1512  Mobility  Activity Ambulated with assistance in hallway  Level of Assistance Standby assist, set-up cues, supervision of patient - no hands on  Assistive Device None  Distance Ambulated (ft) 180 ft  Activity Response Tolerated well  Mobility Referral Yes  Mobility visit 1 Mobility  Mobility Specialist Start Time (ACUTE ONLY) 1452  Mobility Specialist Stop Time (ACUTE ONLY) 1503  Mobility Specialist Time Calculation (min) (ACUTE ONLY) 11 min    During Mobility: SpO2 95% 2LO2, HR 112  Pt pleasant and agreeable to mobility session - received in bed. SV throughout. VSS on 2LO2. Feeling a little fatigued at EOS but otherwise no complaints. Returned to room without fault. Left on EOB with all needs met, call bell in reach.   Deloria Fetch Mobility Specialist Please contact via SecureChat or Rehab office at [phone]

## 2023-12-31 NOTE — Progress Notes (Signed)
 Daily Progress Note   Patient Name: Jose Joseph       Date: 12/31/2023 DOB: [birth year]  Age: 68 y.o. MRN#: [id number] Attending Physician: Jose Peru, MD Primary Care Physician: Jose Bile, MD Admit Date: 12/26/2023  Reason for Consultation/Follow-up: Establishing goals of care   Length of Stay: 5  Current Medications: Scheduled Meds:   arformoterol   15 mcg Nebulization BID   benzonatate   100 mg Oral TID   binimetinib   45 mg Oral BID   budesonide  (PULMICORT ) nebulizer solution  0.25 mg Nebulization BID   Chlorhexidine  Gluconate Cloth  6 each Topical Daily   citalopram   20 mg Oral q AM   encorafenib   450 mg Oral Daily   guaiFENesin   1,200 mg Oral BID   loratadine   10 mg Oral Daily   metoprolol  tartrate  50 mg Oral BID   predniSONE   20 mg Oral Q breakfast   Followed by   Jose Joseph ON 01/02/2024] predniSONE   10 mg Oral Q breakfast   revefenacin   175 mcg Nebulization Daily   sodium chloride  flush  3 mL Intravenous Q12H    Continuous Infusions:  diltiazem  (CARDIZEM ) infusion 7.5 mg/hr (12/31/23 0438)   heparin  1,550 Units/hr (12/31/23 0755)    PRN Meds: acetaminophen  **OR** acetaminophen , HYDROmorphone  (DILAUDID ) injection, levalbuterol , metoprolol  tartrate, ondansetron  **OR** ondansetron  (ZOFRAN ) IV, oxyCODONE , polyethylene glycol, sodium chloride  flush  Physical Exam Vitals reviewed.  Constitutional:      General: He is not in acute distress.    Appearance: He is not ill-appearing.  HENT:     Head: Normocephalic and atraumatic.  Cardiovascular:     Rate and Rhythm: Normal rate.  Pulmonary:     Effort: Pulmonary effort is normal.  Skin:    General: Skin is warm and dry.  Neurological:     Mental Status: He is alert and oriented to person, place, and time.   Psychiatric:        Mood and Affect: Mood normal.        Behavior: Behavior normal.             Vital Signs: BP (!) 105/57 (BP Location: Right Arm)   Pulse (!) 103   Temp 97.6 F (36.4 C) (Oral)   Resp 15   Ht 6\' 1"  (1.854 m)   Wt 116.5 kg  SpO2 94%   BMI 33.89 kg/m  SpO2: SpO2: 94 % O2 Device: O2 Device: Nasal Cannula O2 Flow Rate: O2 Flow Rate (L/min): 2 L/min    Patient Active Problem List   Diagnosis Date Noted   Atrial fibrillation with controlled ventricular rate (HCC) 12/28/2023   Atrial fibrillation with RVR (HCC) 12/27/2023   Malignant neoplasm of lung (HCC) 12/27/2023   Dyspnea on exertion 12/26/2023   Pericardial effusion 12/26/2023   Hypertensive urgency 12/26/2023   Lung cancer metastatic to bone (HCC) 12/26/2023   Former smoker 12/26/2023   History of stroke 12/26/2023   Chronic obstructive pulmonary disease (HCC) 12/26/2023   COPD with acute exacerbation (HCC) 12/15/2023   Malignant neoplasm of upper lobe of right lung (HCC) 11/30/2023   Pulmonary nodules 11/21/2023   Mediastinal adenopathy 11/21/2023   Status post total replacement of left hip 01/15/2021   Elevated PSA 04/10/2020   Pain in left hip 02/26/2020   Pain in right shoulder 02/26/2020    Palliative Care Assessment & Plan   Patient Profile: 68 y.o. male  with past medical history of stage IV non-small cell lung cancer with metastasis to sternum and ribs (undergoing radiation therapy), COPD, depression, and HTN admitted on 12/26/2023 after he had an echo which showed a large pericardial effusion without tamponade. Patient had two weeks of progressive shortening of breath, chest tightness, and dyspnea on exertion.    Patient found to be in A-fib with RVR. Patient had 600 mL removed left thoracentesis.    Today's Discussion: Patient in bed in NAD. [name] is at bedside. He reports he did not have a lot of sleep overnight due to some nausea. He believes this nausea is from his cancer  therapy pills. He received Zofran  for the nausea which provided some relief. He is hopeful that taking the Zofran  and eating something before his pills will help his nausea. We discussed cancer therapies, symptom management, and resources available if they have further questions/concerns.  The patient and his wife do not have any questions or concerns from our discussion yesterday. They are still reviewing the blank HCPOA and living will documents I left yesterday and are glad they will be able to complete these and have them notarized. He is thinking of completing the living will as well. He shared that he currently wants all life sustaining therapies-- but would likely not want his life sustained if he were "brain dead." It seems he would need a significant decline before making changes to code status or scope of care. He remains full code and full scope of care.   Questions addressed. Encouraged patient and family to contact PMT with questions or needs.    Recommendations/Plan: Full code Full scope Encouraged patient to continue discussions re: goals of care with family Spiritual care consult for HCPOA completion PMT support as needed    Code Status:    Code Status Orders  (From admission, onward)           Start     Ordered   12/26/23 1502  Full code  Continuous       Question:  By:  Answer:  Consent: discussion documented in EHR   12/26/23 1502           Extensive chart review has been completed prior to seeing the patient including labs, vital signs, imaging, progress/consult notes, orders, medications, and available advance directive documents.  Care plan was discussed with bedside RN and Dr. Lydia Joseph  Time spent: 57  minutes  Thank you for allowing the Palliative Medicine Team to assist in the care of this patient.   Jose Drum, NP  Please contact Palliative Medicine Team phone at [phone] for questions and concerns.

## 2023-12-31 NOTE — Progress Notes (Signed)
 Progress Note  Patient Name: Jose Joseph Date of Encounter: 12/31/2023  Primary Cardiologist: Olinda Bertrand, DO  Subjective   SOB improved.  Telemetry reviewed, in A-fib, heart rate ranging from 90 to 100s.  When moving in bed, it is increasing up to 1 20-1 30s.  Currently on diltiazem  drip 7.5 mg/h.  Inpatient Medications    Scheduled Meds:  arformoterol   15 mcg Nebulization BID   benzonatate   100 mg Oral TID   binimetinib   45 mg Oral BID   budesonide  (PULMICORT ) nebulizer solution  0.25 mg Nebulization BID   Chlorhexidine  Gluconate Cloth  6 each Topical Daily   citalopram   20 mg Oral q AM   encorafenib   450 mg Oral Daily   guaiFENesin   1,200 mg Oral BID   loratadine   10 mg Oral Daily   metoprolol  tartrate  50 mg Oral BID   predniSONE   20 mg Oral Q breakfast   Followed by   Cecily Cohen ON 01/02/2024] predniSONE   10 mg Oral Q breakfast   revefenacin   175 mcg Nebulization Daily   sodium chloride  flush  3 mL Intravenous Q12H   Continuous Infusions:  diltiazem  (CARDIZEM ) infusion 7.5 mg/hr (12/31/23 0438)   heparin  1,550 Units/hr (12/31/23 0755)   PRN Meds: acetaminophen  **OR** acetaminophen , HYDROmorphone  (DILAUDID ) injection, levalbuterol , metoprolol  tartrate, ondansetron  **OR** ondansetron  (ZOFRAN ) IV, oxyCODONE , polyethylene glycol, sodium chloride  flush   Vital Signs    Vitals:   12/31/23 0454 12/31/23 0753 12/31/23 0801 12/31/23 0804  BP: 101/75  (!) 105/57 (!) 105/57  Pulse: 74 73  (!) 103  Resp: 16 20  15   Temp: 97.8 F (36.6 C)   97.6 F (36.4 C)  TempSrc: Oral   Oral  SpO2: 94%     Weight:      Height:        Intake/Output Summary (Last 24 hours) at 12/31/2023 0939 Last data filed at 12/31/2023 0805 Gross per 24 hour  Intake 1052.14 ml  Output 1650 ml  Net -597.86 ml   Filed Weights   12/26/23 1020 12/27/23 1633  Weight: 118.4 kg 116.5 kg    Telemetry     Personally reviewed.  In atrial fibrillation.  ECG    Not performed today.  Physical  Exam   GEN: No acute distress.   Neck: Not examined due to body habitus. Cardiac: RRR, no murmur, rub, or gallop.  Respiratory: Nonlabored. Clear to auscultation bilaterally. GI: Soft, nontender, bowel sounds present. MS: No edema; No deformity. Neuro:  Nonfocal. Psych: Alert and oriented x 3. Normal affect.  Labs    Chemistry Recent Labs  Lab 12/27/23 1809 12/28/23 0159 12/29/23 0536 12/30/23 0530 12/31/23 0445  NA 137   < > 138 136 136  K 3.4*   < > 3.9 3.2* 3.8  CL 101   < > 105 102 103  CO2 25   < > 25 24 26   GLUCOSE 115*   < > 108* 105* 92  BUN 16   < > 34* 36* 27*  CREATININE 1.02   < > 1.10 1.19 1.20  CALCIUM 9.2   < > 8.8* 8.2* 8.3*  PROT 6.6  --   --   --   --   ALBUMIN 3.1*  --   --   --   --   AST 17  --   --   --   --   ALT 22  --   --   --   --   [facility]  57  --   --   --   --   BILITOT 0.9  --   --   --   --   GFRNONAA >60   < > >60 >60 >60  ANIONGAP 11   < > 8 10 7    < > = values in this interval not displayed.     Hematology Recent Labs  Lab 12/29/23 0536 12/30/23 0530 12/31/23 0445  WBC 11.2* 8.3 6.6  RBC 4.27 4.05* 4.09*  HGB 13.0 12.4* 12.4*  HCT 39.6 37.3* 37.5*  MCV 92.7 92.1 91.7  MCH 30.4 30.6 30.3  MCHC 32.8 33.2 33.1  RDW 12.7 13.1 13.1  PLT 168 186 187    Cardiac Enzymes Recent Labs  Lab 12/26/23 1026 12/26/23 1314 12/26/23 2325 12/27/23 0408  TROPONINIHS 57* 57* 53* 48*    BNP Recent Labs  Lab 12/27/23 0408  BNP 71.4     DDimerNo results for input(s): "DDIMER" in the last 168 hours.   Radiology    ECHOCARDIOGRAM LIMITED Result Date: 12/30/2023    ECHOCARDIOGRAM LIMITED REPORT   Patient Name:   Jose Joseph Date of Exam: 12/30/2023 Medical Rec #:  [id number]     Height:       73.0 in Accession #:    [id number]    Weight:       256.8 lb Date of Birth:  February 17, 1956     BSA:          2.393 m Patient Age:    67 years      BP:           113/76 mmHg Patient Gender: M             HR:           91 bpm. Exam Location:   Inpatient Procedure: Limited Color Doppler, Cardiac Doppler and Limited Echo (Both            Spectral and Color Flow Doppler were utilized during procedure). Indications:    pericardial effusion  History:        Patient has prior history of Echocardiogram examinations, most                 recent 12/26/2023. COPD and lung cancer, Arrythmias:Atrial                 Fibrillation; Risk Factors:Hypertension and Former Smoker.  Sonographer:    Dione Franks RDCS Referring Phys: [id number] PRANAV M PATEL IMPRESSIONS  1. Left ventricular ejection fraction, by estimation, is 55 to 60%. The left ventricle has normal function.  2. A small pericardial effusion is present. The pericardial effusion is anterior to the right ventricle. There is no evidence of cardiac tamponade.  3. The aortic valve is tricuspid. There is mild calcification of the aortic valve. Aortic valve regurgitation is not visualized.  4. The inferior vena cava is dilated in size with >50% respiratory variability, suggesting right atrial pressure of 8 mmHg. Comparison(s): Prior echo: large effusion. FINDINGS  Left Ventricle: Left ventricular ejection fraction, by estimation, is 55 to 60%. The left ventricle has normal function. Pericardium: A small pericardial effusion is present. The pericardial effusion is anterior to the right ventricle. There is no evidence of cardiac tamponade. Tricuspid Valve: Tricuspid valve regurgitation is not demonstrated. Aortic Valve: The aortic valve is tricuspid. There is mild calcification of the aortic valve. Aortic valve regurgitation is not visualized. Venous: The inferior vena cava is dilated in size with greater than  50% respiratory variability, suggesting right atrial pressure of 8 mmHg. Additional Comments: Spectral Doppler performed. Color Doppler performed.  IVC IVC diam: 2.30 cm AORTIC VALVE LVOT Vmax:   85.20 cm/s LVOT Vmean:  57.500 cm/s LVOT VTI:    0.127 m  SHUNTS Systemic VTI: 0.13 m Dorothye Gathers MD Electronically  signed by Dorothye Gathers MD Signature Date/Time: 12/30/2023/4:34:03 PM    Final      Assessment & Plan   Moderate to large pericardial effusion: SOB significantly improved compared to admission. IC rec pericardial window. CTS rec pericardiocentesis. Technically difficult in obtaining pericardial window given his metastatic cancer. Limited echocardiogram yesterday showed small pericardial effusion.  Atrial fibrillation with RVR, new onset: On diltiazem  drip 7.5 mg/h.  Continues to be in atrial fibrillation with HR ranging from 90-100s, elevating to 120 and 130s with moving in the bed.  Keep n.p.o. after midnight for TEE guided DCCV tomorrow.  Patient agreeable to the plan.  Continue heparin  drip.  Upon discharge, recommend subcutaneous Lovenox started on DOAC per oncology and pharmacy given his cancer.  No contraindications to TEE or DCCV.  Informed consent for TEE to DCCV Risks and benefits of the procedure explained to the patient.  Risks include, but not limited to, infection, pneumonia, sore throat, esophageal rupture, skin erythema, cardiac arrest, possibly PPM dilation.  Patient comprehended these risks and agreed to proceed with the procedure.   Signed, Lasalle Pointer, MD  12/31/2023, 9:39 AM

## 2023-12-31 NOTE — Progress Notes (Signed)
 PHARMACY - ANTICOAGULATION Pharmacy Consult for heparin   Indication: atrial fibrillation   Allergies  Allergen Reactions   Bee Venom Anaphylaxis    syncope    Patient Measurements: Height: 6\' 1"  (185.4 cm) Weight: 116.5 kg (256 lb 13.4 oz) IBW/kg (Calculated) : 79.9 HEPARIN  DW (KG): 104.9  Vital Signs: Temp: 97.8 F (36.6 C) (05/18 0454) Temp Source: Oral (05/18 0454) BP: 101/75 (05/18 0454) Pulse Rate: 74 (05/18 0454)  Labs: Recent Labs    12/29/23 0536 12/30/23 0530 12/31/23 0445  HGB 13.0 12.4* 12.4*  HCT 39.6 37.3* 37.5*  PLT 168 186 187  HEPARINUNFRC 0.41 0.41 0.30  CREATININE 1.10 1.19 1.20    Estimated Creatinine Clearance: 77.7 mL/min (by C-G formula based on SCr of 1.2 mg/dL).   Assessment: 68 yo M with PMH stage IV metastatic lung cancer with afib + RVR. Pt also with small stroke burden from stroke in April 2025- likely embolic per neuro. No anticoag PTA. Ok to start anticoagulation per neuro, cardiology, and [facility] primary. Pharmacy consulted to start heparin  infusion (no bolus).  Heparin  level is within goal range at 0.30 on 1550 units/hr. CBC is stable. No known issues with IV infusion, no overt bleeding or complications noted.  Goal of Therapy:  Heparin  level 0.3-0.7 units/ml Monitor platelets by anticoagulation protocol: Yes   Plan:  Continue IV heparin  at current rate of 1550 units/hr Daily heparin  level and CBC Monitor for s/sx of bleeding  F/u plans for oral anticoagulation eventually.  There does appear to be significant drug interaction between encorafenib  and Xarelto/Eliquis, will likely need to use lovenox 1 mg/kg q 12 hrs until plans for alternative chemo therapy can be made.  Thank you for involving pharmacy in this patient's care.  Albino Alu, PharmD PGY2 Cardiology Pharmacy Resident  12/31/2023 6:46 AM   [facility] pharmacy phone numbers are listed on [email or web address]

## 2023-12-31 NOTE — Progress Notes (Signed)
 [facility] Progress Note Patient: Jose Joseph WUJ:[id number] DOB: 1955/10/04 DOA: 12/26/2023  DOS: the patient was seen and examined on 12/31/2023  Brief Hospital Course: Patient with PMH of multiple colon cancer with metastasis to sternum and ribs undergoing radiation therapy, COPD/asthma, depression, former smoker, obesity presented to the hospital with complaints of shortness of breath and abnormal echocardiogram. Shortness of breath specifically occurring on exertion ongoing for 2 weeks. Found to have large pericardial effusion without tamponade, A-fib with RVR and pleural effusion. Cardiology, medical oncology and IR were consulted. CT surgery also reviewed patient's echocardiogram. Currently plan is for conservative management.  Repeat echocardiogram on 5/17 shows much smaller pericardial effusion.  Now plan for DCCV 5/19  Assessment and Plan: Large pericardial effusion. Currently no tamponade physiology. Underwent outpatient echocardiogram before initiation of chemotherapy and with new onset hypertension and was found to have large circumferential pericardial effusion. Was recommended to go to ER for further workup. Seen by cardiology and CT surgery as well as interventional cardiology. Repeat echocardiogram on 5/17 shows pericardial effusion much smaller in size.  No further workup for the effusion.  New onset A-fib with RVR: After admission patient was found to be in RVR. No prior history. Currently on IV heparin  and IV Cardizem  as well as oral metoprolol  with as needed IV metoprolol . CHADVASC score is relatively high. Currently on IV heparin . Cardizem  and amiodarone will not be an option long-term given interaction with chemotherapy. DOACs will not be an option for long-term given interaction with his chemotherapy. Lovenox for stroke prevention once stable for transition. Will discuss with pharmacy if once a day regimen is appropriate for the patient. Patient's wife is  an Char[employer] fundraiser who can provide the injections. Management per cardiology. Given that the patient remains with RVR plan is for cardioversion tomorrow.   Bilateral small to moderate pleural effusion: Small to moderate effusion noted on CT of chest. IR performed left pleural effusion thoracentesis.  600 mL removed. No hemorrhage in the fluid.  LDH elevated meeting criteria for exudative effusion.  Follow-up on cytology.  Less likely infection.  Cultures are negative.  COPD with exacerbation with bronchitis Reporting 2 weeks of progressively worsening shortness of breath and cough. Uses inhalers at home. Was started on IV antibiotic. Procalcitonin is negative. Too early for radiation pneumonitis.  Wheezing appears to be improved. Continue steroids antibiotic Mucinex  and other medications for cough. Currently receiving Nebulizer therapy.  Malignant neoplasm of upper lobe of right lung stage IV metastatic to ribs and sternum Patient currently receiving XRT with Dr. Jeryl Moris who was notified about patient's admission Oncology consult appreciated. Plan is to initiate systemic chemotherapy Multiple drug interaction. Unable to use Cardizem , amiodarone as well as DOAC's.   Hypertensive urgency On admission blood pressure was 167/109 with heart rate in 120s Currently blood pressure significantly better.  Obesity Class 1 Body mass index is 33.89 kg/m.  Placing the pt at higher risk of poor outcomes.  Mild hypokalemia. Replaced.  Acute diastolic CHF. Mild.  Has some swelling in his lower EXTR. Echocardiogram EF 55 to 60%.   Grade 1 diastolic dysfunction.  Large circumferential pericardial effusion. As needed IV Lasix  right now.   Subjective: No nausea no vomiting.  No fevers or chills.  Feeling better.  Physical Exam: General: in Mild distress, No Rash Cardiovascular: S1 and S2 Present, No Murmur Respiratory: Good respiratory effort, Bilateral Air entry present. No Crackles, No  wheezes Abdomen: Bowel Sound present, No tenderness Extremities: No edema Neuro: Alert and oriented  x3, no new focal deficit  Data Reviewed: I have Reviewed nursing notes, Vitals, and Lab results. Since last encounter, pertinent lab results CBC and BMP   . I have ordered test including CBC BMP heparin  level  .   Disposition: Status is: Inpatient Remains inpatient appropriate because: Monitor for improvement in rate control  Place and maintain sequential compression device Start: 12/29/23 0855 Place TED hose Start: 12/29/23 0855   Family Communication: No one at bedside Level of care: Progressive   Vitals:   12/31/23 0801 12/31/23 0804 12/31/23 1053 12/31/23 1531  BP: (!) 105/57 (!) 105/57 100/78 100/80  Pulse:  (!) 103    Resp:  15    Temp:  97.6 F (36.4 C) 98.4 F (36.9 C) 97.8 F (36.6 C)  TempSrc:  Oral Oral Oral  SpO2:      Weight:      Height:         Author: Charlean Congress, MD 12/31/2023 5:30 PM  Please look on [email or web address] to find out who is on call.

## 2023-12-31 NOTE — Progress Notes (Signed)
 Pt reporting nausea after taking chemo medication and requesting Zofran  before future administrations.

## 2023-12-31 NOTE — Plan of Care (Signed)
   Problem: Coping: Goal: Level of anxiety will decrease Outcome: Progressing

## 2024-01-01 ENCOUNTER — Encounter (HOSPITAL_COMMUNITY): Payer: Self-pay | Admitting: Cardiovascular Disease

## 2024-01-01 ENCOUNTER — Inpatient Hospital Stay (HOSPITAL_COMMUNITY)

## 2024-01-01 ENCOUNTER — Encounter (HOSPITAL_COMMUNITY)
Admission: EM | Disposition: A | Discharge status: Home / Self Care | Payer: Self-pay | Source: Home / Self Care | Attending: Internal Medicine

## 2024-01-01 DIAGNOSIS — Z87891 Personal history of nicotine dependence: Secondary | ICD-10-CM | POA: Diagnosis not present

## 2024-01-01 DIAGNOSIS — I3139 Other pericardial effusion (noninflammatory): Secondary | ICD-10-CM

## 2024-01-01 DIAGNOSIS — I4819 Other persistent atrial fibrillation: Secondary | ICD-10-CM

## 2024-01-01 DIAGNOSIS — I10 Essential (primary) hypertension: Secondary | ICD-10-CM

## 2024-01-01 DIAGNOSIS — I340 Nonrheumatic mitral (valve) insufficiency: Secondary | ICD-10-CM | POA: Diagnosis not present

## 2024-01-01 DIAGNOSIS — J449 Chronic obstructive pulmonary disease, unspecified: Secondary | ICD-10-CM

## 2024-01-01 DIAGNOSIS — I4891 Unspecified atrial fibrillation: Secondary | ICD-10-CM | POA: Diagnosis not present

## 2024-01-01 LAB — HEPATIC FUNCTION PANEL
ALT: 31 U/L (ref 0–44)
AST: 18 U/L (ref 15–41)
Albumin: 2.8 g/dL — ABNORMAL LOW (ref 3.5–5.0)
Alkaline Phosphatase: 57 U/L (ref 38–126)
Bilirubin, Direct: <0.1 mg/dL (ref 0.0–0.2)
Total Bilirubin: 0.5 mg/dL (ref 0.0–1.2)
Total Protein: 5.9 g/dL — ABNORMAL LOW (ref 6.5–8.1)

## 2024-01-01 LAB — BASIC METABOLIC PANEL WITH GFR
Anion gap: 10 (ref 5–15)
BUN: 26 mg/dL — ABNORMAL HIGH (ref 8–23)
CO2: 26 mmol/L (ref 22–32)
Calcium: 8.8 mg/dL — ABNORMAL LOW (ref 8.9–10.3)
Chloride: 101 mmol/L (ref 98–111)
Creatinine, Ser: 1.38 mg/dL — ABNORMAL HIGH (ref 0.61–1.24)
GFR, Estimated: 55 mL/min — ABNORMAL LOW (ref >60)
Glucose, Bld: 82 mg/dL (ref 70–99)
Potassium: 3.9 mmol/L (ref 3.5–5.1)
Sodium: 137 mmol/L (ref 135–145)

## 2024-01-01 LAB — CBC
HCT: 40.2 % (ref 39.0–52.0)
Hemoglobin: 12.8 g/dL — ABNORMAL LOW (ref 13.0–17.0)
MCH: 29.8 pg (ref 26.0–34.0)
MCHC: 31.8 g/dL (ref 30.0–36.0)
MCV: 93.7 fL (ref 80.0–100.0)
Platelets: 189 10*3/uL (ref 150–400)
RBC: 4.29 MIL/uL (ref 4.22–5.81)
RDW: 13.1 % (ref 11.5–15.5)
WBC: 6.1 10*3/uL (ref 4.0–10.5)
nRBC: 0 % (ref 0.0–0.2)

## 2024-01-01 LAB — HEPARIN LEVEL (UNFRACTIONATED): Heparin Unfractionated: 0.4 [IU]/mL (ref 0.30–0.70)

## 2024-01-01 LAB — MAGNESIUM: Magnesium: 2.3 mg/dL (ref 1.7–2.4)

## 2024-01-01 LAB — ECHO TEE

## 2024-01-01 LAB — CYTOLOGY - NON PAP

## 2024-01-01 SURGERY — TRANSESOPHAGEAL ECHOCARDIOGRAM (TEE) (CATHLAB)
Anesthesia: Monitor Anesthesia Care

## 2024-01-01 MED ORDER — PROPOFOL 10 MG/ML IV BOLUS
INTRAVENOUS | Status: DC | PRN
  Administered 2024-01-01: 70 mg via INTRAVENOUS
  Administered 2024-01-01: 30 mg via INTRAVENOUS

## 2024-01-01 MED ORDER — POTASSIUM CHLORIDE CRYS ER 20 MEQ PO TBCR
20.0000 meq | EXTENDED_RELEASE_TABLET | Freq: Once | ORAL | Status: AC
  Administered 2024-01-01: 20 meq via ORAL
  Filled 2024-01-01: qty 1

## 2024-01-01 MED ORDER — SODIUM CHLORIDE 0.9 % IV SOLN: INTRAVENOUS | Status: DC | PRN

## 2024-01-01 MED ORDER — LIDOCAINE 2% (20 MG/ML) 5 ML SYRINGE
INTRAMUSCULAR | Status: DC | PRN
  Administered 2024-01-01: 100 mg via INTRAVENOUS

## 2024-01-01 MED ORDER — PHENYLEPHRINE 80 MCG/ML (10ML) SYRINGE FOR IV PUSH (FOR BLOOD PRESSURE SUPPORT)
PREFILLED_SYRINGE | INTRAVENOUS | Status: DC | PRN
  Administered 2024-01-01 (×3): 160 ug via INTRAVENOUS

## 2024-01-01 SURGICAL SUPPLY — 1 items: PAD DEFIB RADIO PHYSIO CONN (PAD) ×1 IMPLANT

## 2024-01-01 NOTE — Interval H&P Note (Signed)
 History and Physical Interval Note:  01/01/2024 8:53 AM  Jose Joseph  has presented today for surgery, with the diagnosis of afib.  The various methods of treatment have been discussed with the patient and family. After consideration of risks, benefits and other options for treatment, the patient has consented to  Procedure(s): TRANSESOPHAGEAL ECHOCARDIOGRAM (N/A) CARDIOVERSION (N/A) as a surgical intervention.  The patient's history has been reviewed, patient examined, no change in status, stable for surgery.  I have reviewed the patient's chart and labs.  Questions were answered to the patient's satisfaction.     [name]

## 2024-01-01 NOTE — Progress Notes (Signed)
 [facility] Progress Note Patient: Jose Joseph ZOX:[id number] DOB: February 26, 1956 DOA: 12/26/2023  DOS: the patient was seen and examined on 01/01/2024  Brief Hospital Course: Patient with PMH of multiple colon cancer with metastasis to sternum and ribs undergoing radiation therapy, COPD/asthma, depression, former smoker, obesity presented to the hospital with complaints of shortness of breath and abnormal echocardiogram. Shortness of breath specifically occurring on exertion ongoing for 2 weeks. Found to have large pericardial effusion without tamponade, A-fib with RVR and pleural effusion. Cardiology, medical oncology and IR were consulted. CT surgery also reviewed patient's echocardiogram. Currently plan is for conservative management.  Repeat echocardiogram on 5/17 shows much smaller pericardial effusion.  Now plan for DCCV 5/19  Assessment and Plan: Large pericardial effusion. Currently no tamponade physiology. Underwent outpatient echocardiogram before initiation of chemotherapy and with new onset hypertension and was found to have large circumferential pericardial effusion. Was recommended to go to ER for further workup. Seen by cardiology and CT surgery as well as interventional cardiology. Repeat echocardiogram on 5/17 shows pericardial effusion much smaller in size.  No further workup for the effusion.  New onset A-fib with RVR: After admission patient was found to be in RVR. No prior history. Currently on IV heparin  and IV Cardizem  as well as oral metoprolol  with as needed IV metoprolol . CHADVASC score is relatively high. Currently on IV heparin . Cardizem  and amiodarone will not be an option long-term given interaction with chemotherapy. DOACs will not be an option for long-term given interaction with his chemotherapy. Lovenox for stroke prevention once stable for transition. Will discuss with pharmacy if once a day regimen is appropriate for the patient. Patient's wife is  an Char[employer] fundraiser who can provide the injections. Management per cardiology.   Bilateral small to moderate pleural effusion: Small to moderate effusion noted on CT of chest. IR performed left pleural effusion thoracentesis.  600 mL removed. No hemorrhage in the fluid.  LDH elevated meeting criteria for exudative effusion.  Cultures negative.  Cytology positive for adenocarcinoma.  COPD with exacerbation with bronchitis Reporting 2 weeks of progressively worsening shortness of breath and cough. Uses inhalers at home. Was started on IV antibiotic. Procalcitonin is negative. Too early for radiation pneumonitis.  Wheezing appears to be improved. Continue steroids antibiotic Mucinex  and other medications for cough. Currently receiving Nebulizer therapy.  Malignant neoplasm of upper lobe of right lung stage IV metastatic to ribs and sternum Patient currently receiving XRT with Dr. Jeryl Moris who was notified about patient's admission Oncology consult appreciated. Plan is to initiate systemic chemotherapy Multiple drug interaction. Unable to use Cardizem , amiodarone as well as DOAC's. Pleural effusion cytology came back positive for adenocarcinoma.  Patient aware.   Hypertensive urgency On admission blood pressure was 167/109 with heart rate in 120s Currently blood pressure significantly better.  Obesity Class 1 Body mass index is 33.89 kg/m.  Placing the pt at higher risk of poor outcomes.  Mild hypokalemia. Replaced.  Acute diastolic CHF. Mild.  Has some swelling in his lower EXTR. Echocardiogram EF 55 to 60%.   Grade 1 diastolic dysfunction.  Large circumferential pericardial effusion. As needed IV Lasix  right now.  Will defer to cardiology for further management.   Subjective: No nausea or vomiting.  Breathing better.  Swelling in the legs seen.  Physical Exam: General: in Mild distress, No Rash Cardiovascular: S1 and S2 Present, No Murmur Respiratory: Good respiratory effort, Bilateral  Air entry present. No Crackles, No wheezes Abdomen: Bowel Sound present, No tenderness Extremities: Bilateral edema  Neuro: Alert and oriented x3, no new focal deficit  Data Reviewed: I have Reviewed nursing notes, Vitals, and Lab results. Since last encounter, pertinent lab results CBC and BMP   . I have ordered test including CBC and BMP  .   Disposition: Status is: Inpatient Remains inpatient appropriate because: Monitor for improvement in respiratory status  Place and maintain sequential compression device Start: 12/29/23 0855 Place TED hose Start: 12/29/23 0855   Family Communication: No one at bedside Level of care: Progressive   Vitals:   01/01/24 1141 01/01/24 1558 01/01/24 1926 01/01/24 1936  BP: 109/73 103/83 122/77   Pulse: 77 72 78   Resp: 16 (!) 23 10   Temp: 97.8 F (36.6 C) 97.8 F (36.6 C) 98 F (36.7 C)   TempSrc: Oral Oral Oral   SpO2: 92% 93% 91% 93%  Weight:      Height:         Author: Charlean Congress, MD 01/01/2024 7:45 PM  Please look on [email or web address] to find out who is on call.

## 2024-01-01 NOTE — Anesthesia Preprocedure Evaluation (Signed)
 Anesthesia Evaluation  Patient identified by MRN, date of birth, ID band Patient awake    Reviewed: Allergy & Precautions, NPO status , Patient's Chart, lab work & pertinent test results  Airway Mallampati: III  TM Distance: >3 FB Neck ROM: Full    Dental  (+) Dental Advisory Given   Pulmonary shortness of breath, asthma , COPD, former smoker   breath sounds clear to auscultation       Cardiovascular hypertension, Pt. on medications + dysrhythmias Atrial Fibrillation  Rhythm:Regular Rate:Normal     Neuro/Psych    Depression    CVA    GI/Hepatic negative GI ROS, Neg liver ROS,,,  Endo/Other  negative endocrine ROS    Renal/GU negative Renal ROS     Musculoskeletal  (+) Arthritis ,    Abdominal  (+) + obese  Peds  Hematology negative hematology ROS (+)   Anesthesia Other Findings   Reproductive/Obstetrics                             Anesthesia Physical Anesthesia Plan  ASA: 3  Anesthesia Plan: MAC   Post-op Pain Management: Minimal or no pain anticipated   Induction: Intravenous  PONV Risk Score and Plan: 1 and Treatment may vary due to age or medical condition and Propofol  infusion  Airway Management Planned: Nasal Cannula  Additional Equipment: None  Intra-op Plan:   Post-operative Plan:   Informed Consent: I have reviewed the patients History and Physical, chart, labs and discussed the procedure including the risks, benefits and alternatives for the proposed anesthesia with the patient or authorized representative who has indicated his/her understanding and acceptance.     Dental advisory given  Plan Discussed with: CRNA  Anesthesia Plan Comments:        Anesthesia Quick Evaluation

## 2024-01-01 NOTE — Progress Notes (Signed)
 IP PROGRESS NOTE  Subjective:   Jose Joseph continues to have improvement in dyspnea.  He had an episode of nausea Saturday night.  He continues encorafenib  and binimetinib .  He is scheduled for.  Cardioversion today. Objective: Vital signs in last 24 hours: Blood pressure 109/73, pulse 77, temperature 97.8 F (36.6 C), temperature source Oral, resp. rate 16, height 6\' 1"  (1.854 m), weight 256 lb 13.4 oz (116.5 kg), SpO2 92%.  Intake/Output from previous day: 05/18 0701 - 05/19 0700 In: 480 [P.O.:480] Out: 1300 [Urine:1300]  Physical Exam: HEENT: No thrush Lungs: Distant breath sounds, no respiratory distress, bilateral end expiratory rhonchi Cardiac: Irregular Abdomen: Nontender, no hepatomegaly Extremities: No leg edema Musculoskeletal: Mild tenderness at the left low lateral chest wall and sternum  Portacath/PICC-without erythema  Lab Results: Recent Labs    12/31/23 0445 01/01/24 0538  WBC 6.6 6.1  HGB 12.4* 12.8*  HCT 37.5* 40.2  PLT 187 189    BMET Recent Labs    12/31/23 0445 01/01/24 0538  NA 136 137  K 3.8 3.9  CL 103 101  CO2 26 26  GLUCOSE 92 82  BUN 27* 26*  CREATININE 1.20 1.38*  CALCIUM 8.3* 8.8*    No results found for: "CEA1", "CEA", "ZOX096", "CA125"  Studies/Results: ECHO TEE Result Date: 01/01/2024    TRANSESOPHOGEAL ECHO REPORT   Patient Name:   Jose Joseph Date of Exam: 01/01/2024 Medical Rec #:  [id number]     Height:       73.0 in Accession #:    [id number]    Weight:       256.8 lb Date of Birth:  July 29, 1956     BSA:          2.393 m Patient Age:    67 years      BP:           123/82 mmHg Patient Gender: M             HR:           94 bpm. Exam Location:  Inpatient Procedure: Transesophageal Echo, Cardiac Doppler and Color Doppler (Both            Spectral and Color Flow Doppler were utilized during procedure). Indications:     Atrial Fibrillation  History:         Patient has prior history of Echocardiogram examinations, most                   recent 12/30/2023. COPD and Stroke, Arrythmias:Atrial                  Fibrillation, Signs/Symptoms:Dyspnea; Risk Factors:Hypertension                  and Former Smoker.  Sonographer:     Terrilee Few RCS Referring Phys:  [id number] Casimer Clear Diagnosing Phys: Luana Rumple MD PROCEDURE: After discussion of the risks and benefits of a TEE, an informed consent was obtained from the patient. The transesophogeal probe was passed without difficulty through the esophogus of the patient. Imaged were obtained with the patient in a left lateral decubitus position. Sedation performed by different physician. The patient was monitored while under deep sedation. Anesthestetic sedation was provided intravenously by Anesthesiology: 100mg  of Propofol , 100mg  of Lidocaine . The patient developed no complications during the procedure. A successful direct current cardioversion was performed at 200 joules with 1 attempt.  IMPRESSIONS  1. Left ventricular ejection fraction, by estimation, is 50  to 55%. The left ventricle has low normal function. The left ventricle demonstrates global hypokinesis.  2. Right ventricular systolic function is normal. The right ventricular size is normal.  3. No left atrial/left atrial appendage thrombus was detected. The LAA emptying velocity was 65 cm/s.  4. A small pericardial effusion is present. The pericardial effusion is circumferential. There is no evidence of cardiac tamponade.  5. The mitral valve is normal in structure. Trivial mitral valve regurgitation. No evidence of mitral stenosis.  6. The aortic valve is tricuspid. Aortic valve regurgitation is mild. No aortic stenosis is present.  7. There is borderline dilatation of the ascending aorta, measuring 38 mm. There is mild (Grade II) plaque involving the descending aorta. FINDINGS  Left Ventricle: Left ventricular ejection fraction, by estimation, is 50 to 55%. The left ventricle has low normal function. The left ventricle  demonstrates global hypokinesis. The left ventricular internal cavity size was normal in size. There is no left ventricular hypertrophy. Right Ventricle: The right ventricular size is normal. No increase in right ventricular wall thickness. Right ventricular systolic function is normal. Left Atrium: Left atrial size was normal in size. No left atrial/left atrial appendage thrombus was detected. The LAA emptying velocity was 65 cm/s. Right Atrium: Right atrial size was normal in size. Pericardium: A small pericardial effusion is present. The pericardial effusion is circumferential. There is no evidence of cardiac tamponade. Mitral Valve: The mitral valve is normal in structure. Trivial mitral valve regurgitation. No evidence of mitral valve stenosis. Tricuspid Valve: The tricuspid valve is normal in structure. Tricuspid valve regurgitation is mild . No evidence of tricuspid stenosis. Aortic Valve: The aortic valve is tricuspid. Aortic valve regurgitation is mild. No aortic stenosis is present. Pulmonic Valve: The pulmonic valve was grossly normal. Pulmonic valve regurgitation is trivial. No evidence of pulmonic stenosis. Aorta: The aortic root is normal in size and structure. There is borderline dilatation of the ascending aorta, measuring 38 mm. There is mild (Grade II) plaque involving the descending aorta. IAS/Shunts: No atrial level shunt detected by color flow Doppler. Additional Comments: Spectral Doppler performed. Luana Rumple MD Electronically signed by Luana Rumple MD Signature Date/Time: 01/01/2024/9:32:05 AM    Final    EP STUDY Result Date: 01/01/2024 See surgical note for result.  ECHOCARDIOGRAM LIMITED Result Date: 12/30/2023    ECHOCARDIOGRAM LIMITED REPORT   Patient Name:   Jose Joseph Date of Exam: 12/30/2023 Medical Rec #:  [id number]     Height:       73.0 in Accession #:    [id number]    Weight:       256.8 lb Date of Birth:  [birth year]     BSA:          2.393 m Patient Age:    67 years       BP:           113/76 mmHg Patient Gender: M             HR:           91 bpm. Exam Location:  Inpatient Procedure: Limited Color Doppler, Cardiac Doppler and Limited Echo (Both            Spectral and Color Flow Doppler were utilized during procedure). Indications:    pericardial effusion  History:        Patient has prior history of Echocardiogram examinations, most  recent 12/26/2023. COPD and lung cancer, Arrythmias:Atrial                 Fibrillation; Risk Factors:Hypertension and Former Smoker.  Sonographer:    Dione Franks RDCS Referring Phys: [id number] PRANAV M PATEL IMPRESSIONS  1. Left ventricular ejection fraction, by estimation, is 55 to 60%. The left ventricle has normal function.  2. A small pericardial effusion is present. The pericardial effusion is anterior to the right ventricle. There is no evidence of cardiac tamponade.  3. The aortic valve is tricuspid. There is mild calcification of the aortic valve. Aortic valve regurgitation is not visualized.  4. The inferior vena cava is dilated in size with >50% respiratory variability, suggesting right atrial pressure of 8 mmHg. Comparison(s): Prior echo: large effusion. FINDINGS  Left Ventricle: Left ventricular ejection fraction, by estimation, is 55 to 60%. The left ventricle has normal function. Pericardium: A small pericardial effusion is present. The pericardial effusion is anterior to the right ventricle. There is no evidence of cardiac tamponade. Tricuspid Valve: Tricuspid valve regurgitation is not demonstrated. Aortic Valve: The aortic valve is tricuspid. There is mild calcification of the aortic valve. Aortic valve regurgitation is not visualized. Venous: The inferior vena cava is dilated in size with greater than 50% respiratory variability, suggesting right atrial pressure of 8 mmHg. Additional Comments: Spectral Doppler performed. Color Doppler performed.  IVC IVC diam: 2.30 cm AORTIC VALVE LVOT Vmax:   85.20 cm/s LVOT  Vmean:  57.500 cm/s LVOT VTI:    0.127 m  SHUNTS Systemic VTI: 0.13 m Dorothye Gathers MD Electronically signed by Dorothye Gathers MD Signature Date/Time: 12/30/2023/4:34:03 PM    Final     Medications: I have reviewed the patient's current medications.  Assessment/Plan:  Stage IV non-small cell lung cancer 10/19/2023 PET-bilateral lung nodules, dominant right upper lobe spiculated lesion, by lateral hilar and mediastinal lymphadenopathy, right supraclavicular lymphadenopathy, hypermetabolic bone lesions at the sternum, left sixth rib, and right iliac Bronchoscopy/biopsy 11/21/2023-right upper lobe nodule and level 4R/11R lymph nodes-adenocarcinoma, BRAF V600 E, PD-L1 TPS 95% 12/06/2023: MRI brain-small foci of diffusion signal abnormality without corresponding enhancement, likely acute/subacute infarcts Scheduled to begin encorafenib  and binimetinib  01/01/2024 Palliative radiation to the lung, sternum, and ribs beginning 12/15/2023 CT chest 12/26/2023: Enlarged left supraclavicular node, significant enlargement of lymph nodes at the left mediastinum/AP window, left internal mammary nodes and bilateral hilar nodes, groundglass airspace disease in the bilateral lungs, worsening right middle lobe consolidation, progressive bone metastases, new right adrenal nodule, small right and small to moderate left pleural effusion, moderate pericardial effusion Encorafenib /binimetinib  15 2025   2.  Circumferential pericardial effusion on echocardiogram 12/26/2023 3.  Cough/dyspnea secondary to #1-probable components of effusions and airspace disease Left thoracentesis 12/28/2023    Jose Joseph has metastatic non-small cell lung cancer.  His clinical status has improved following the thoracentesis 12/28/2023. The dyspnea is likely multifactorial with atrial fibrillation, the pericardial effusion, COPD, and airspace disease contributing.  The pericardial effusion was small on an echocardiogram 12/30/2023.  He appears to be  tolerating the encorafenib  and binimetinib  well.  Pharmacy is working on converting to dabrafenib and trametinib  at discharge, though this may be cost prohibitive.   Recommendations: Management of atrial fibrillation per the medical and cardiology services Continue encorafenib /binimetinib  in the hospital, plan for a change to dabrafenib and trametinib  at discharge (if these drugs can be obtained via the outpatient cancer center pharmacy), monitor for adverse side effects by pharmacy while hospitalized Management of pericardial effusion per cardiology  Check liver panel Outpatient follow-up with Dr. Cheree Cords   LOS: 6 days   Coni Deep, MD   01/01/2024, 1:17 PM

## 2024-01-01 NOTE — Op Note (Signed)
 Procedure: Electrical Cardioversion Indications:  Atrial Fibrillation  Procedure Details:  Consent: Risks of procedure as well as the alternatives and risks of each were explained to the (patient/caregiver).  Consent for procedure obtained.  Time Out: Verified patient identification, verified procedure, site/side was marked, verified correct patient position, special equipment/implants available, medications/allergies/relevent history reviewed, required imaging and test results available.  Performed  Patient placed on cardiac monitor, pulse oximetry, supplemental oxygen  as necessary.  Sedation given: IV propofol , Dr. Annabell Key Pacer pads placed anterior and posterior chest.  Cardioverted 1 time(s).  Cardioversion with synchronized biphasic 200J shock.  Evaluation: Findings: Post procedure EKG shows: NSR Complications: None Patient did tolerate procedure well.  Time Spent Directly with the Patient:  30 minutes   Jose Joseph 01/01/2024, 9:03 AM

## 2024-01-01 NOTE — Progress Notes (Signed)
 Mobility Specialist Progress Note;   01/01/24 1540  Mobility  Activity Refused mobility   Checked on pt twice today. First attempt pt was receiving breathing treatment and deferred till later on, 2nd attempt pt was asleep. Will f/u tomorrow.  Janit Meline Mobility Specialist Please contact via SecureChat or [employer] [phone]

## 2024-01-01 NOTE — Progress Notes (Signed)
 Echocardiogram Echocardiogram Transesophageal has been performed.  Jose Joseph 01/01/2024, 9:09 AM

## 2024-01-01 NOTE — Transfer of Care (Signed)
 Immediate Anesthesia Transfer of Care Note  Patient: Jose Joseph  Procedure(s) Performed: TRANSESOPHAGEAL ECHOCARDIOGRAM CARDIOVERSION  Patient Location: Cath Lab  Anesthesia Type:MAC  Level of Consciousness: awake, alert , and oriented  Airway & Oxygen  Therapy: Patient Spontanous Breathing and Patient connected to nasal cannula oxygen   Post-op Assessment: Report given to RN and Post -op Vital signs reviewed and stable  Post vital signs: Reviewed and stable  Last Vitals:  Vitals Value Taken Time  BP    Temp    Pulse 74 01/01/24 0910  Resp 21 01/01/24 0910  SpO2 93 % 01/01/24 0910  Vitals shown include unfiled device data.  Last Pain:  Vitals:   01/01/24 0808  TempSrc:   PainSc: 0-No pain         Complications: No notable events documented.

## 2024-01-01 NOTE — Progress Notes (Signed)
 SBP 87/64. Patient on cardizem  gtt at 7.5. Per instructions gtt discontinued and Dr. Leandrew Proctor, cardiologist, paged to advise further.Aaron Aas

## 2024-01-01 NOTE — Anesthesia Postprocedure Evaluation (Signed)
 Anesthesia Post Note  Patient: ROBEY MASSMANN  Procedure(s) Performed: TRANSESOPHAGEAL ECHOCARDIOGRAM CARDIOVERSION     Patient location during evaluation: PACU Anesthesia Type: MAC Level of consciousness: awake and alert Pain management: pain level controlled Vital Signs Assessment: post-procedure vital signs reviewed and stable Respiratory status: spontaneous breathing, nonlabored ventilation and respiratory function stable Cardiovascular status: blood pressure returned to baseline and stable Postop Assessment: no apparent nausea or vomiting Anesthetic complications: no   No notable events documented.  Last Vitals:  Vitals:   01/01/24 0942 01/01/24 0957  BP: 111/71 95/76  Pulse: 79   Resp: 18   Temp:    SpO2: 91%     Last Pain:  Vitals:   01/01/24 1022  TempSrc:   PainSc: 2                  Earvin Goldberg

## 2024-01-01 NOTE — Op Note (Signed)
 INDICATIONS: AFib pre-cardioversion  PROCEDURE:   Informed consent was obtained prior to the procedure. The risks, benefits and alternatives for the procedure were discussed and the patient comprehended these risks.  Risks include, but are not limited to, cough, sore throat, vomiting, nausea, somnolence, esophageal and stomach trauma or perforation, bleeding, low blood pressure, aspiration, pneumonia, infection, trauma to the teeth and death.    After a procedural time-out, the oropharynx was anesthetized with 20% benzocaine spray.   During this procedure the patient was administered IV propofol  by Anesthesiology, Dr. Annabell Key  The transesophageal probe was inserted in the esophagus and stomach without difficulty and multiple views were obtained.  The patient was kept under observation until the patient left the procedure room.  The patient left the procedure room in stable condition.   Agitated microbubble saline contrast was not administered.  COMPLICATIONS:    There were no immediate complications.  FINDINGS:  No LA thrombus. Mildly reduced LV systolic function, global hypokinesis. Small-medium sized pericardial effusion.  RECOMMENDATIONS:     Proceed with cardioversion  Time Spent Directly with the Patient:  30 minutes   Jose Joseph 01/01/2024, 9:04 AM

## 2024-01-01 NOTE — Plan of Care (Signed)

## 2024-01-01 NOTE — Progress Notes (Signed)
 This chaplain responded to PMT NP-Dawn's referral for assisting the Pt. in completing HCPOA and Living Will. The Pt. is sleeping at the time of the visit. The chaplain understands the Pt. did not sleep well the previous night. This chaplain will plan a revisit.  Chaplain Kathleene Papas [phone]

## 2024-01-01 NOTE — Plan of Care (Signed)
 Cross Cover Note   Paged re Bps overnight 97/64, asymptomatic and sleeping. Per hold parameters, dilt gtt held given SBP < 90.  Assessment: asymptomatic hypotension 2/2 sleeping and short acting PO metoprolol  tartrate given this evening now at peak serum dose.  Plan: -reduce dilt gtt to 5 and restart now -I have reduced hold parameter of dilt gtt to SBP < 85   Avonne Boettcher, MD Cardiology

## 2024-01-01 NOTE — Progress Notes (Signed)
 PHARMACY - ANTICOAGULATION Pharmacy Consult for heparin   Indication: atrial fibrillation   Allergies  Allergen Reactions   Bee Venom Anaphylaxis    syncope    Patient Measurements: Height: 6\' 1"  (185.4 cm) Weight: 116.5 kg (256 lb 13.4 oz) IBW/kg (Calculated) : 79.9 HEPARIN  DW (KG): 104.9  Vital Signs: Temp: 97.8 F (36.6 C) (05/19 0313) Temp Source: Oral (05/19 0313) BP: 96/70 (05/19 0313) Pulse Rate: 70 (05/19 0313)  Labs: Recent Labs    12/30/23 0530 12/31/23 0445 01/01/24 0538  HGB 12.4* 12.4* 12.8*  HCT 37.3* 37.5* 40.2  PLT 186 187 189  HEPARINUNFRC 0.41 0.30 0.40  CREATININE 1.19 1.20 1.38*    Estimated Creatinine Clearance: 67.5 mL/min (A) (by C-G formula based on SCr of 1.38 mg/dL (H)).   Assessment: 68 yo M with PMH stage IV metastatic lung cancer with afib + RVR. Pt also with small stroke burden from stroke in April 2025- likely embolic per neuro. No anticoag PTA. Ok to start anticoagulation per neuro, cardiology, and TRH primary. Pharmacy consulted to start heparin  infusion (no bolus).  Heparin  level is within goal range at 0.40 on 1550 units/hr. CBC is stable. No known issues with IV infusion, no overt bleeding or complications noted.  Goal of Therapy:  Heparin  level 0.3-0.7 units/ml Monitor platelets by anticoagulation protocol: Yes   Plan:  Continue IV heparin  at current rate of 1550 units/hr Daily heparin  level and CBC Monitor for s/sx of bleeding  F/u plans for oral anticoagulation eventually.  There does appear to be significant drug interaction between encorafenib  and Xarelto /Eliquis, will likely need to use lovenox 1 mg/kg q 12 hrs until plans for alternative chemo therapy can be made.  Thank you for involving pharmacy in this patient's care.  Cecillia Cogan, PharmD Clinical Pharmacist 01/01/2024  7:07 AM

## 2024-01-02 ENCOUNTER — Other Ambulatory Visit (HOSPITAL_COMMUNITY): Payer: Self-pay

## 2024-01-02 ENCOUNTER — Encounter: Payer: Self-pay | Admitting: *Deleted

## 2024-01-02 ENCOUNTER — Other Ambulatory Visit: Payer: Self-pay | Admitting: *Deleted

## 2024-01-02 DIAGNOSIS — C3491 Malignant neoplasm of unspecified part of right bronchus or lung: Secondary | ICD-10-CM

## 2024-01-02 DIAGNOSIS — I4819 Other persistent atrial fibrillation: Principal | ICD-10-CM

## 2024-01-02 DIAGNOSIS — I3139 Other pericardial effusion (noninflammatory): Secondary | ICD-10-CM | POA: Diagnosis not present

## 2024-01-02 LAB — CBC
HCT: 38.8 % — ABNORMAL LOW (ref 39.0–52.0)
Hemoglobin: 12.6 g/dL — ABNORMAL LOW (ref 13.0–17.0)
MCH: 30.5 pg (ref 26.0–34.0)
MCHC: 32.5 g/dL (ref 30.0–36.0)
MCV: 93.9 fL (ref 80.0–100.0)
Platelets: 185 10*3/uL (ref 150–400)
RBC: 4.13 MIL/uL — ABNORMAL LOW (ref 4.22–5.81)
RDW: 13 % (ref 11.5–15.5)
WBC: 6.5 10*3/uL (ref 4.0–10.5)
nRBC: 0 % (ref 0.0–0.2)

## 2024-01-02 LAB — BASIC METABOLIC PANEL WITH GFR
Anion gap: 6 (ref 5–15)
BUN: 27 mg/dL — ABNORMAL HIGH (ref 8–23)
CO2: 28 mmol/L (ref 22–32)
Calcium: 8.5 mg/dL — ABNORMAL LOW (ref 8.9–10.3)
Chloride: 103 mmol/L (ref 98–111)
Creatinine, Ser: 1.27 mg/dL — ABNORMAL HIGH (ref 0.61–1.24)
GFR, Estimated: >60 mL/min (ref >60)
Glucose, Bld: 77 mg/dL (ref 70–99)
Potassium: 3.9 mmol/L (ref 3.5–5.1)
Sodium: 137 mmol/L (ref 135–145)

## 2024-01-02 LAB — HEPARIN LEVEL (UNFRACTIONATED): Heparin Unfractionated: 0.31 [IU]/mL (ref 0.30–0.70)

## 2024-01-02 LAB — MAGNESIUM: Magnesium: 2.3 mg/dL (ref 1.7–2.4)

## 2024-01-02 MED ORDER — FUROSEMIDE 10 MG/ML IJ SOLN
20.0000 mg | Freq: Once | INTRAMUSCULAR | Status: DC
  Filled 2024-01-02: qty 2

## 2024-01-02 MED ORDER — FUROSEMIDE 20 MG PO TABS
20.0000 mg | ORAL_TABLET | Freq: Every day | ORAL | 0 refills | Status: DC | PRN
  Filled 2024-01-02: qty 30, 30d supply, fill #0

## 2024-01-02 MED ORDER — ENOXAPARIN SODIUM 120 MG/0.8ML IJ SOSY
1.0000 mg/kg | PREFILLED_SYRINGE | Freq: Two times a day (BID) | INTRAMUSCULAR | 0 refills | Status: DC
  Filled 2024-01-02: qty 8, 5d supply, fill #0
  Filled 2024-01-02: qty 40, 26d supply, fill #0

## 2024-01-02 MED ORDER — HEPARIN SOD (PORK) LOCK FLUSH 100 UNIT/ML IV SOLN
500.0000 [IU] | INTRAVENOUS | Status: AC | PRN
  Administered 2024-01-02: 500 [IU]

## 2024-01-02 MED ORDER — ENCORAFENIB 75 MG PO CAPS: 450.0000 mg | ORAL_CAPSULE | Freq: Every day | ORAL | Status: DC

## 2024-01-02 MED ORDER — POLYETHYLENE GLYCOL 3350 17 GM/SCOOP PO POWD
17.0000 g | Freq: Every day | ORAL | 0 refills | Status: DC | PRN
  Filled 2024-01-02: qty 238, 14d supply, fill #0

## 2024-01-02 MED ORDER — METOPROLOL TARTRATE 50 MG PO TABS
50.0000 mg | ORAL_TABLET | Freq: Two times a day (BID) | ORAL | 0 refills | Status: DC
  Filled 2024-01-02: qty 60, 30d supply, fill #0

## 2024-01-02 MED ORDER — BINIMETINIB 15 MG PO TABS: 45.0000 mg | ORAL_TABLET | Freq: Two times a day (BID) | ORAL | Status: DC

## 2024-01-02 MED ORDER — ENOXAPARIN SODIUM 120 MG/0.8ML IJ SOSY
120.0000 mg | PREFILLED_SYRINGE | Freq: Two times a day (BID) | INTRAMUSCULAR | Status: DC
  Administered 2024-01-02: 120 mg via SUBCUTANEOUS
  Filled 2024-01-02: qty 0.8

## 2024-01-02 MED ORDER — BENZONATATE 100 MG PO CAPS
100.0000 mg | ORAL_CAPSULE | Freq: Three times a day (TID) | ORAL | 0 refills | Status: DC
  Filled 2024-01-02: qty 20, 7d supply, fill #0

## 2024-01-02 MED ORDER — BREZTRI AEROSPHERE 160-9-4.8 MCG/ACT IN AERO
2.0000 | INHALATION_SPRAY | Freq: Two times a day (BID) | RESPIRATORY_TRACT | 1 refills | Status: DC
  Filled 2024-01-02: qty 10.7, 30d supply, fill #0

## 2024-01-02 MED ORDER — ALBUTEROL SULFATE HFA 108 (90 BASE) MCG/ACT IN AERS
1.0000 | INHALATION_SPRAY | RESPIRATORY_TRACT | 1 refills | Status: DC | PRN
  Filled 2024-01-02: qty 18, 33d supply, fill #0

## 2024-01-02 MED ORDER — GUAIFENESIN ER 600 MG PO TB12
1200.0000 mg | ORAL_TABLET | Freq: Two times a day (BID) | ORAL | 0 refills | Status: DC
  Filled 2024-01-02: qty 60, 15d supply, fill #0

## 2024-01-02 NOTE — Progress Notes (Signed)
 Oncology Discharge Planning Note  [facility] Address: [address], [place], [place] [address] Hours of Operation:  [place] [place], Monday - Friday  Clinic Contact Information:  [phone]  Oncology Care Team: Medical Oncologist:  Scherrie Curt   Patient Details: Name:  Jose Joseph, Jose Joseph MRN:   [id number] DOB:   August 18, 1955 Reason for Current Admission: @PPROB @  Discharge Planning Narrative: Notification of admission received by inpatient team for Jose Joseph.  Discharge follow-up appointments for oncology are current and available on the AVS and MyChart.   Upon discharge from the hospital, hematology/oncology's post discharge plan of care for the outpatient setting is:   May 29,2025 at 0900 with Dr Scherrie Curt  [facility] [address] [place], [place] [address]  [phone]   Jose Joseph will be called within two business days after discharge to review hematology/oncology's plan of care for full understanding.    Outpatient Oncology Specific Care Only: Oncology appointment transportation needs addressed?:  no Oncology medication management for symptom management addressed?:  not applicable Chemo Alert Card reviewed?:  not applicable Immunotherapy Alert Card reviewed?:  not applicable

## 2024-01-02 NOTE — Plan of Care (Signed)
   Problem: Education: Goal: Knowledge of General Education information will improve Description Including pain rating scale, medication(s)/side effects and non-pharmacologic comfort measures Outcome: Progressing   Problem: Clinical Measurements: Goal: Ability to maintain clinical measurements within normal limits will improve Outcome: Progressing   Problem: Activity: Goal: Risk for activity intolerance will decrease Outcome: Progressing

## 2024-01-02 NOTE — Progress Notes (Signed)
 Mobility Specialist Progress Note;   01/02/24 0920  Mobility  Activity Ambulated with assistance in hallway  Level of Assistance Standby assist, set-up cues, supervision of patient - no hands on  Assistive Device None  Distance Ambulated (ft) 225 ft  Activity Response Tolerated well  Mobility Referral Yes  Mobility visit 1 Mobility  Mobility Specialist Start Time (ACUTE ONLY) 0920  Mobility Specialist Stop Time (ACUTE ONLY) [id number]  Mobility Specialist Time Calculation (min) (ACUTE ONLY) 7 min   Pt eager for mobility. Required no physical assistance during ambulation, SV. Ambulated on RA, SPO2 91-93% throughout. HR up to 96 bpm w/ activity. Pt returned back to bed with all needs met. Wife in room.   Janit Meline Mobility Specialist Please contact via SecureChat or [employer] [phone]

## 2024-01-02 NOTE — Progress Notes (Signed)
   Patient Name: Jose Joseph Date of Encounter: 01/02/2024 [facility] Cardiologist: Olinda Bertrand, DO   Interval Summary  .    Feels well.  No chest pain or shortness of breath  Vital Signs .    Vitals:   01/01/24 1936 01/01/24 2151 01/01/24 2342 01/02/24 0805  BP:  115/65 (!) 103/57 129/77  Pulse:  89 72 81  Resp:   17 17  Temp:   97.7 F (36.5 C) 97.8 F (36.6 C)  TempSrc:   Oral Oral  SpO2: 93%  94% 93%  Weight:      Height:        Intake/Output Summary (Last 24 hours) at 01/02/2024 0957 Last data filed at 01/02/2024 0806 Gross per 24 hour  Intake --  Output 2400 ml  Net -2400 ml      12/27/2023    4:33 PM 12/26/2023   10:20 AM 12/21/2023   10:13 AM  Last 3 Weights  Weight (lbs) 256 lb 13.4 oz 261 lb 266 lb  Weight (kg) 116.5 kg 118.389 kg 120.657 kg      Telemetry/ECG    Normal sinus rhythm with no recurrent atrial fibrillation- Personally Reviewed  Physical Exam .   GEN: No acute distress.   Neck: No JVD Cardiac: RRR, no murmurs, rubs, or gallops.  Respiratory: Clear to auscultation bilaterally. GI: Soft, nontender, non-distended  MS: No edema  Assessment & Plan .     1.  Persistent atrial fibrillation: Status post TEE/cardioversion yesterday.  Maintaining sinus rhythm.  Discontinue diltiazem  due to drug interaction.  Continue metoprolol  at current dose.  Direct oral anticoagulant drugs have an interaction with his chemotherapeutic agents.  Will likely discharge him on Lovenox.  His wife is a Engineer, civil (consulting) and should be able to administer.  Will check in with pharmacy for once daily dosing recommendations. 2.  Pericardial effusion: Mild to moderate effusion on most recent imaging.  Since he will be on anticoagulation, favor a repeat limited 2D echocardiogram in 4 weeks as an outpatient to make sure his effusion is not increasing in size.  Disposition: Stable for discharge from a cardiac perspective.  Will arrange outpatient follow-up with Dr. Albert Huff.    For questions or updates, please contact  [facility] Please consult [email or web address] for contact info under        Signed, Arnoldo Lapping, MD

## 2024-01-02 NOTE — Progress Notes (Addendum)
 PHARMACY - ANTICOAGULATION Pharmacy Consult for heparin  > lovenox Indication: atrial fibrillation   Allergies  Allergen Reactions   Bee Venom Anaphylaxis    syncope    Patient Measurements: Height: 6\' 1"  (185.4 cm) Weight: 116.5 kg (256 lb 13.4 oz) IBW/kg (Calculated) : 79.9 HEPARIN  DW (KG): 104.9  Vital Signs: Temp: 97.8 F (36.6 C) (05/20 0805) Temp Source: Oral (05/20 0805) BP: 129/77 (05/20 0805) Pulse Rate: 81 (05/20 0805)  Labs: Recent Labs    12/31/23 0445 01/01/24 0538 01/02/24 0430  HGB 12.4* 12.8* 12.6*  HCT 37.5* 40.2 38.8*  PLT 187 189 185  HEPARINUNFRC 0.30 0.40 0.31  CREATININE 1.20 1.38* 1.27*    Estimated Creatinine Clearance: 73.4 mL/min (A) (by C-G formula based on SCr of 1.27 mg/dL (H)).   Assessment: 68 yo M with PMH stage IV metastatic lung cancer with afib + RVR. Pt also with small stroke burden from stroke in April 2025- likely embolic per neuro. No anticoag PTA. Ok to start anticoagulation per neuro, cardiology, and TRH primary. Pharmacy consulted to start heparin  infusion (no bolus).  Heparin  level is within goal range at 0.31 on 1550 units/hr. CBC is stable. No known issues with IV infusion, no overt bleeding or complications noted.  Goal of Therapy:  Heparin  level 0.3-0.7 units/ml Monitor platelets by anticoagulation protocol: Yes   Plan:  Continue IV heparin  at current rate of 1550 units/hr Daily heparin  level and CBC Monitor for s/sx of bleeding  F/u plans for oral anticoagulation eventually.  There does appear to be significant drug interaction between encorafenib  and Xarelto /Eliquis, will likely need to use lovenox 1 mg/kg q 12 hrs until plans for alternative chemo therapy can be made.  Thank you for involving pharmacy in this patient's care.  [name], [name], [facility] Clinical Pharmacist  01/02/2024 8:35 AM   [facility] pharmacy phone numbers are listed on [email or web address]   Addendum: Will switch to lovenox 120 mg q 12 hrs in  anticipation of discharge.    [name], [name], [facility] Clinical Pharmacist  01/02/2024 10:43 AM   [facility] pharmacy phone numbers are listed on [email or web address]

## 2024-01-02 NOTE — Progress Notes (Addendum)
 IP PROGRESS NOTE  Subjective:   [name] underwent cardioversion yesterday.  Dyspnea remains improved.  He has mild discomfort at the bilateral low anterolateral chest wall.  Nausea improves when he takes Zofran  and eats crackers prior to binimetinib  and encorafenib . Objective: Vital signs in last 24 hours: Blood pressure 129/77, pulse 81, temperature 97.8 F (36.6 C), temperature source Oral, resp. rate 17, height 6\' 1"  (1.854 m), weight 256 lb 13.4 oz (116.5 kg), SpO2 93%.  Intake/Output from previous day: 05/19 0701 - 05/20 0700 In: 100 [I.V.:100] Out: 2375 [Urine:2375]  Physical Exam: HEENT: No thrush Lungs: Distant breath sounds, no respiratory distress Cardiac: Regular rhythm Abdomen: Nontender Extremities: No leg edema Musculoskeletal: Mild tenderness at the low sternum  Portacath/PICC-without erythema  Lab Results: Recent Labs    01/01/24 0538 01/02/24 0430  WBC 6.1 6.5  HGB 12.8* 12.6*  HCT 40.2 38.8*  PLT 189 185    BMET Recent Labs    01/01/24 0538 01/02/24 0430  NA 137 137  K 3.9 3.9  CL 101 103  CO2 26 28  GLUCOSE 82 77  BUN 26* 27*  CREATININE 1.38* 1.27*  CALCIUM 8.8* 8.5*    No results found for: "CEA1", "CEA", "ZOX096", "CA125"  Studies/Results: ECHO TEE Result Date: 01/01/2024    TRANSESOPHOGEAL ECHO REPORT   Patient Name:   Jose Joseph Date of Exam: 01/01/2024 Medical Rec #:  [id number]     Height:       73.0 in Accession #:    [id number]    Weight:       256.8 lb Date of Birth:  1956-01-05     BSA:          2.393 m Patient Age:    67 years      BP:           123/82 mmHg Patient Gender: M             HR:           94 bpm. Exam Location:  Inpatient Procedure: Transesophageal Echo, Cardiac Doppler and Color Doppler (Both            Spectral and Color Flow Doppler were utilized during procedure). Indications:     Atrial Fibrillation  History:         Patient has prior history of Echocardiogram examinations, most                  recent  12/30/2023. COPD and Stroke, Arrythmias:Atrial                  Fibrillation, Signs/Symptoms:Dyspnea; Risk Factors:Hypertension                  and Former Smoker.  Sonographer:     Terrilee Few RCS Referring Phys:  [id number] Casimer Clear Diagnosing Phys: Luana Rumple MD PROCEDURE: After discussion of the risks and benefits of a TEE, an informed consent was obtained from the patient. The transesophogeal probe was passed without difficulty through the esophogus of the patient. Imaged were obtained with the patient in a left lateral decubitus position. Sedation performed by different physician. The patient was monitored while under deep sedation. Anesthestetic sedation was provided intravenously by Anesthesiology: 100mg  of Propofol , 100mg  of Lidocaine . The patient developed no complications during the procedure. A successful direct current cardioversion was performed at 200 joules with 1 attempt.  IMPRESSIONS  1. Left ventricular ejection fraction, by estimation, is 50 to 55%. The left ventricle  has low normal function. The left ventricle demonstrates global hypokinesis.  2. Right ventricular systolic function is normal. The right ventricular size is normal.  3. No left atrial/left atrial appendage thrombus was detected. The LAA emptying velocity was 65 cm/s.  4. A small pericardial effusion is present. The pericardial effusion is circumferential. There is no evidence of cardiac tamponade.  5. The mitral valve is normal in structure. Trivial mitral valve regurgitation. No evidence of mitral stenosis.  6. The aortic valve is tricuspid. Aortic valve regurgitation is mild. No aortic stenosis is present.  7. There is borderline dilatation of the ascending aorta, measuring 38 mm. There is mild (Grade II) plaque involving the descending aorta. FINDINGS  Left Ventricle: Left ventricular ejection fraction, by estimation, is 50 to 55%. The left ventricle has low normal function. The left ventricle demonstrates global  hypokinesis. The left ventricular internal cavity size was normal in size. There is no left ventricular hypertrophy. Right Ventricle: The right ventricular size is normal. No increase in right ventricular wall thickness. Right ventricular systolic function is normal. Left Atrium: Left atrial size was normal in size. No left atrial/left atrial appendage thrombus was detected. The LAA emptying velocity was 65 cm/s. Right Atrium: Right atrial size was normal in size. Pericardium: A small pericardial effusion is present. The pericardial effusion is circumferential. There is no evidence of cardiac tamponade. Mitral Valve: The mitral valve is normal in structure. Trivial mitral valve regurgitation. No evidence of mitral valve stenosis. Tricuspid Valve: The tricuspid valve is normal in structure. Tricuspid valve regurgitation is mild . No evidence of tricuspid stenosis. Aortic Valve: The aortic valve is tricuspid. Aortic valve regurgitation is mild. No aortic stenosis is present. Pulmonic Valve: The pulmonic valve was grossly normal. Pulmonic valve regurgitation is trivial. No evidence of pulmonic stenosis. Aorta: The aortic root is normal in size and structure. There is borderline dilatation of the ascending aorta, measuring 38 mm. There is mild (Grade II) plaque involving the descending aorta. IAS/Shunts: No atrial level shunt detected by color flow Doppler. Additional Comments: Spectral Doppler performed. Luana Rumple MD Electronically signed by Luana Rumple MD Signature Date/Time: 01/01/2024/9:32:05 AM    Final    EP STUDY Result Date: 01/01/2024 See surgical note for result.   Medications: I have reviewed the patient's current medications.  Assessment/Plan:  Stage IV non-small cell lung cancer 10/19/2023 PET-bilateral lung nodules, dominant right upper lobe spiculated lesion, by lateral hilar and mediastinal lymphadenopathy, right supraclavicular lymphadenopathy, hypermetabolic bone lesions at the sternum,  left sixth rib, and right iliac Bronchoscopy/biopsy 11/21/2023-right upper lobe nodule and level 4R/11R lymph nodes-adenocarcinoma, BRAF V600 E, PD-L1 TPS 95% 12/06/2023: MRI brain-small foci of diffusion signal abnormality without corresponding enhancement, likely acute/subacute infarcts Scheduled to begin encorafenib  and binimetinib  01/01/2024 Palliative radiation to the lung, sternum, and ribs beginning 12/15/2023 CT chest 12/26/2023: Enlarged left supraclavicular node, significant enlargement of lymph nodes at the left mediastinum/AP window, left internal mammary nodes and bilateral hilar nodes, groundglass airspace disease in the bilateral lungs, worsening right middle lobe consolidation, progressive bone metastases, new right adrenal nodule, small right and small to moderate left pleural effusion, moderate pericardial effusion Encorafenib /binimetinib  12/28/2023   2.  Circumferential pericardial effusion on echocardiogram 12/26/2023 3.  Cough/dyspnea secondary to #1-probable components of effusions and airspace disease Left thoracentesis 12/28/2023-adenocarcinoma 4.   Atrial fibrillation, status post cardioversion 01/01/2024    Jose Joseph has metastatic non-small cell lung cancer.  His clinical status has improved following the thoracentesis 12/28/2023.  The left  pleural effusion is malignant. The dyspnea is likely multifactorial with atrial fibrillation, the pericardial effusion, COPD, and airspace disease contributing.  The pericardial effusion was small on an echocardiogram 12/30/2023.  He appears to be tolerating the encorafenib  and binimetinib  well.  Pharmacy is working on converting to dabrafenib and trametinib  at discharge.   Recommendations: Management of atrial fibrillation per the medical and cardiology services, does he need to continue diltiazem ? Continue encorafenib /binimetinib  in the hospital, plan for a change to dabrafenib and trametinib  when these medications are available, monitor for  adverse side effects and drug interactions by pharmacy  Anticoagulation for atrial fibrillation per cardiology, Lovenox if DOAC cannot be used Outpatient oncology follow-up with a chest x-ray to be scheduled within the next 1-2 weeks   LOS: 7 days   Coni Deep, MD   01/02/2024, 8:46 AM

## 2024-01-02 NOTE — Addendum Note (Signed)
 Addended by: Thaddeus Filippo on: 01/02/2024 10:20 AM   Modules accepted: Orders

## 2024-01-02 NOTE — Discharge Summary (Signed)
 Physician Discharge Summary   Patient: Jose Joseph MRN: [id number] DOB: 20-Jun-1956  Admit date:     12/26/2023  Discharge date: 01/02/2024  Discharge Physician: Charlean Congress  PCP: Artemisa Bile, MD  Recommendations at discharge: Follow-up with cardiology in 1 week. Follow-up with hematology in 1 week. Follow-up with PCP with a BMP and CBC in 1 week.   Follow-up Information     Artemisa Bile, MD. Schedule an appointment as soon as possible for a visit in 1 week(s).   Specialty: Internal Medicine Why: with BMP lab to look at kidney and electrolytes, with CBC lab to look at blood counts Jan 05, 2024 @11 :30 AM Contact information: [address] [place] [address] [phone]         Paulett Boros, MD. Schedule an appointment as soon as possible for a visit in 1 week(s).   Specialty: Hematology Why: Jan 10, 2024 @9AM  Contact information: [address] [place] [address] [phone]         Olinda Bertrand, DO. Schedule an appointment as soon as possible for a visit in 1 month(s).   Specialties: Cardiology, Vascular Surgery Contact information: [address] [place] [address] [phone]                Discharge Diagnoses: Principal Problem:   Pericardial effusion Active Problems:   Malignant neoplasm of upper lobe of right lung (HCC)   Dyspnea on exertion   Hypertensive urgency   Lung cancer metastatic to bone [facility])   Former smoker   History of stroke   Chronic obstructive pulmonary disease (HCC)   Atrial fibrillation with RVR (HCC)   Malignant neoplasm of lung (HCC)   Atrial fibrillation with controlled ventricular rate (HCC)   Persistent atrial fibrillation [facility])  Hospital Course: Patient with PMH of multiple colon cancer with metastasis to sternum and ribs undergoing radiation therapy, COPD/asthma, depression, former smoker, obesity presented to the hospital with complaints of shortness of breath and abnormal  echocardiogram. Shortness of breath specifically occurring on exertion ongoing for 2 weeks. Found to have large pericardial effusion without tamponade, A-fib with RVR and pleural effusion. Cardiology, medical oncology and IR were consulted. CT surgery also reviewed patient's echocardiogram. Currently plan is for conservative management.  Repeat echocardiogram on 5/17 shows much smaller pericardial effusion.  Underwent DCCV 5/19.  Assessment and Plan: Large pericardial effusion Currently no tamponade physiology. Underwent outpatient echocardiogram before initiation of chemotherapy and with new onset hypertension and was found to have large circumferential pericardial effusion. Was recommended to go to ER for further workup. Seen by cardiology and CT surgery as well as interventional cardiology. Repeat echocardiogram on 5/17 shows pericardial effusion much smaller in size. No further workup for the effusion.  New onset A-fib with RVR: After admission patient was found to be in RVR. No prior history. Was on the IV heparin  and IV Cardizem  as well as oral metoprolol  with as needed IV metoprolol . CHADVASC score is relatively high. Cardizem  and amiodarone will not be an option long-term given interaction with chemotherapy. DOACs will not be an option for long-term given interaction with his chemotherapy. Lovenox twice daily for stroke prevention. Patient's wife is an Char[employer] fundraiser who can provide the injections. Management per cardiology. Discussed endocarditis and continue metoprolol .   Malignant pleural effusion bilateral. Small to moderate effusion noted on CT of chest. IR performed left pleural effusion thoracentesis.  600 mL removed. No hemorrhage in the fluid. Cultures negative.   Cytology positive for adenocarcinoma.  COPD  with exacerbation with bronchitis Reporting 2 weeks of progressively worsening shortness of breath and cough. Uses inhalers at home. Was started on IV  antibiotic. Procalcitonin is negative. Too early for radiation pneumonitis.  Wheezing appears to be improved. Treated with steroids and antibiotic. Continue inhalers on discharge.  Malignant neoplasm of upper lobe of right lung stage IV metastatic to ribs and sternum Patient currently receiving XRT with Dr. Jeryl Moris who was notified about patient's admission Oncology consult appreciated. Plan is to initiate systemic chemotherapy Multiple drug interaction. Unable to use Cardizem , amiodarone as well as DOAC's. Pleural effusion cytology came back positive for adenocarcinoma.  Patient aware.   Hypertensive urgency On admission blood pressure was 167/109 with heart rate in 120s Currently blood pressure significantly better.  Obesity Class 1 Body mass index is 33.89 kg/m.  Placing the pt at higher risk of poor outcomes.  Mild hypokalemia. Replaced.  Acute diastolic CHF. Mild.  Has some swelling in his lower EXTR. Echocardiogram EF 55 to 60%.   Grade 1 diastolic dysfunction.  Large circumferential pericardial effusion. Will send home with as needed Lasix .  Consultants:  Oncology Cardiology IR  Procedures performed:  Thoracentesis Echocardiogram TEE and DCCV Limited echocardiogram  DISCHARGE MEDICATION: Allergies as of 01/02/2024       Reactions   Bee Venom Anaphylaxis   syncope        Medication List     STOP taking these medications    amoxicillin -clavulanate 875-125 MG tablet Commonly known as: AUGMENTIN    fexofenadine 180 MG tablet Commonly known as: ALLEGRA   predniSONE  10 MG tablet Commonly known as: DELTASONE    Trelegy Ellipta  100-62.5-25 MCG/ACT Aepb Generic drug: Fluticasone-Umeclidin-Vilant       TAKE these medications    benzonatate  100 MG capsule Commonly known as: TESSALON  Take 1 capsule (100 mg total) by mouth 3 (three) times daily.   binimetinib  15 MG tablet Commonly known as: MEKTOVI  Take 3 tablets (45 mg total) by mouth 2 (two)  times daily.   Breztri  Aerosphere 160-9-4.8 MCG/ACT Aero inhaler Generic drug: budesonide -glycopyrrolate -formoterol  Inhale 2 puffs into the lungs in the morning and at bedtime.   cetirizine 10 MG tablet Commonly known as: ZYRTEC Take 10 mg by mouth in the morning.   citalopram  20 MG tablet Commonly known as: CELEXA  Take 20 mg by mouth in the morning.   encorafenib  75 MG capsule Commonly known as: BRAFTOVI  Take 6 capsules (450 mg total) by mouth daily.   enoxaparin 120 MG/0.8ML injection Commonly known as: LOVENOX Inject 0.78 mLs (117 mg total) into the skin 2 (two) times daily.   furosemide  20 MG tablet Commonly known as: Lasix  Take 1 tablet (20 mg total) by mouth daily as needed (for weight gain of 3 lbs in 1 day or 5 lbs in 1 week).   guaiFENesin  600 MG 12 hr tablet Commonly known as: MUCINEX  Take 2 tablets (1,200 mg total) by mouth 2 (two) times daily.   metoprolol  tartrate 50 MG tablet Commonly known as: LOPRESSOR  Take 1 tablet (50 mg total) by mouth 2 (two) times daily.   polyethylene glycol powder 17 GM/SCOOP powder Commonly known as: GLYCOLAX /MIRALAX  Take 17 g by mouth daily as needed for mild constipation.   prochlorperazine  10 MG tablet Commonly known as: COMPAZINE  Take 1 tablet (10 mg total) by mouth every 6 (six) hours as needed.   Ventolin  HFA 108 (90 Base) MCG/ACT inhaler Generic drug: albuterol  Inhale 1 puff into the lungs every 4 (four) hours as needed for wheezing. What changed:  how much to take when to take this reasons to take this       Disposition: Home Diet recommendation: Cardiac diet  Discharge Exam: Vitals:   01/01/24 2342 01/02/24 0805 01/02/24 1020 01/02/24 1151  BP: (!) 103/57 129/77 (!) 152/92 112/86  Pulse: 72 81 90 72  Resp: 17 17  19   Temp: 97.7 F (36.5 C) 97.8 F (36.6 C)  97.8 F (36.6 C)  TempSrc: Oral Oral  Oral  SpO2: 94% 93%  93%  Weight:      Height:       General: Appear in mild distress; no visible  Abnormal Neck Mass Or lumps, Conjunctiva normal Cardiovascular: S1 and S2 Present, no Murmur, Respiratory: good respiratory effort, Bilateral Air entry present and CTA, no Crackles, no wheezes Abdomen: Bowel Sound present, Non tender  Extremities: trace Pedal edema Neurology: alert and oriented to time, place, and person  Filed Weights   12/26/23 1020 12/27/23 1633  Weight: 118.4 kg 116.5 kg   Condition at discharge: stable  The results of significant diagnostics from this hospitalization (including imaging, microbiology, ancillary and laboratory) are listed below for reference.   Imaging Studies: ECHO TEE Result Date: 01/01/2024    TRANSESOPHOGEAL ECHO REPORT   Patient Name:   Jose Joseph Date of Exam: 01/01/2024 Medical Rec #:  [id number]     Height:       73.0 in Accession #:    [id number]    Weight:       256.8 lb Date of Birth:  [birth year]     BSA:          2.393 m Patient Age:    67 years      BP:           123/82 mmHg Patient Gender: M             HR:           94 bpm. Exam Location:  Inpatient Procedure: Transesophageal Echo, Cardiac Doppler and Color Doppler (Both            Spectral and Color Flow Doppler were utilized during procedure). Indications:     Atrial Fibrillation  History:         Patient has prior history of Echocardiogram examinations, most                  recent 12/30/2023. COPD and Stroke, Arrythmias:Atrial                  Fibrillation, Signs/Symptoms:Dyspnea; Risk Factors:Hypertension                  and Former Smoker.  Sonographer:     Terrilee Few RCS Referring Phys:  [id number] Casimer Clear Diagnosing Phys: Luana Rumple MD PROCEDURE: After discussion of the risks and benefits of a TEE, an informed consent was obtained from the patient. The transesophogeal probe was passed without difficulty through the esophogus of the patient. Imaged were obtained with the patient in a left lateral decubitus position. Sedation performed by different physician. The patient was  monitored while under deep sedation. Anesthestetic sedation was provided intravenously by Anesthesiology: 100mg  of Propofol , 100mg  of Lidocaine . The patient developed no complications during the procedure. A successful direct current cardioversion was performed at 200 joules with 1 attempt.  IMPRESSIONS  1. Left ventricular ejection fraction, by estimation, is 50 to 55%. The left ventricle has low normal function. The left ventricle demonstrates global hypokinesis.  2. Right ventricular  systolic function is normal. The right ventricular size is normal.  3. No left atrial/left atrial appendage thrombus was detected. The LAA emptying velocity was 65 cm/s.  4. A small pericardial effusion is present. The pericardial effusion is circumferential. There is no evidence of cardiac tamponade.  5. The mitral valve is normal in structure. Trivial mitral valve regurgitation. No evidence of mitral stenosis.  6. The aortic valve is tricuspid. Aortic valve regurgitation is mild. No aortic stenosis is present.  7. There is borderline dilatation of the ascending aorta, measuring 38 mm. There is mild (Grade II) plaque involving the descending aorta. FINDINGS  Left Ventricle: Left ventricular ejection fraction, by estimation, is 50 to 55%. The left ventricle has low normal function. The left ventricle demonstrates global hypokinesis. The left ventricular internal cavity size was normal in size. There is no left ventricular hypertrophy. Right Ventricle: The right ventricular size is normal. No increase in right ventricular wall thickness. Right ventricular systolic function is normal. Left Atrium: Left atrial size was normal in size. No left atrial/left atrial appendage thrombus was detected. The LAA emptying velocity was 65 cm/s. Right Atrium: Right atrial size was normal in size. Pericardium: A small pericardial effusion is present. The pericardial effusion is circumferential. There is no evidence of cardiac tamponade. Mitral Valve:  The mitral valve is normal in structure. Trivial mitral valve regurgitation. No evidence of mitral valve stenosis. Tricuspid Valve: The tricuspid valve is normal in structure. Tricuspid valve regurgitation is mild . No evidence of tricuspid stenosis. Aortic Valve: The aortic valve is tricuspid. Aortic valve regurgitation is mild. No aortic stenosis is present. Pulmonic Valve: The pulmonic valve was grossly normal. Pulmonic valve regurgitation is trivial. No evidence of pulmonic stenosis. Aorta: The aortic root is normal in size and structure. There is borderline dilatation of the ascending aorta, measuring 38 mm. There is mild (Grade II) plaque involving the descending aorta. IAS/Shunts: No atrial level shunt detected by color flow Doppler. Additional Comments: Spectral Doppler performed. Luana Rumple MD Electronically signed by Luana Rumple MD Signature Date/Time: 01/01/2024/9:32:05 AM    Final    EP STUDY Result Date: 01/01/2024 See surgical note for result.  ECHOCARDIOGRAM LIMITED Result Date: 12/30/2023    ECHOCARDIOGRAM LIMITED REPORT   Patient Name:   Jose Joseph Date of Exam: 12/30/2023 Medical Rec #:  [id number]     Height:       73.0 in Accession #:    [id number]    Weight:       256.8 lb Date of Birth:  03-Nov-1955     BSA:          2.393 m Patient Age:    67 years      BP:           113/76 mmHg Patient Gender: M             HR:           91 bpm. Exam Location:  Inpatient Procedure: Limited Color Doppler, Cardiac Doppler and Limited Echo (Both            Spectral and Color Flow Doppler were utilized during procedure). Indications:    pericardial effusion  History:        Patient has prior history of Echocardiogram examinations, most                 recent 12/26/2023. COPD and lung cancer, Arrythmias:Atrial  Fibrillation; Risk Factors:Hypertension and Former Smoker.  Sonographer:    Dione Franks RDCS Referring Phys: [id number] Magalie Almon M Hamdi Kley IMPRESSIONS  1. Left ventricular ejection  fraction, by estimation, is 55 to 60%. The left ventricle has normal function.  2. A small pericardial effusion is present. The pericardial effusion is anterior to the right ventricle. There is no evidence of cardiac tamponade.  3. The aortic valve is tricuspid. There is mild calcification of the aortic valve. Aortic valve regurgitation is not visualized.  4. The inferior vena cava is dilated in size with >50% respiratory variability, suggesting right atrial pressure of 8 mmHg. Comparison(s): Prior echo: large effusion. FINDINGS  Left Ventricle: Left ventricular ejection fraction, by estimation, is 55 to 60%. The left ventricle has normal function. Pericardium: A small pericardial effusion is present. The pericardial effusion is anterior to the right ventricle. There is no evidence of cardiac tamponade. Tricuspid Valve: Tricuspid valve regurgitation is not demonstrated. Aortic Valve: The aortic valve is tricuspid. There is mild calcification of the aortic valve. Aortic valve regurgitation is not visualized. Venous: The inferior vena cava is dilated in size with greater than 50% respiratory variability, suggesting right atrial pressure of 8 mmHg. Additional Comments: Spectral Doppler performed. Color Doppler performed.  IVC IVC diam: 2.30 cm AORTIC VALVE LVOT Vmax:   85.20 cm/s LVOT Vmean:  57.500 cm/s LVOT VTI:    0.127 m  SHUNTS Systemic VTI: 0.13 m Dorothye Gathers MD Electronically signed by Dorothye Gathers MD Signature Date/Time: 12/30/2023/4:34:03 PM    Final    DG Chest 1 View Result Date: 12/28/2023 CLINICAL DATA:  Status post thoracentesis. EXAM: CHEST  1 VIEW COMPARISON:  Dec 26, 2023. FINDINGS: Stable cardiomediastinal silhouette. Right internal jugular Port-A-Cath is unchanged. Stable right perihilar opacity is noted concerning for atelectasis and possible underlying neoplasm. No pneumothorax status post thoracentesis. Minimal left basilar subsegmental atelectasis. Bony thorax is unremarkable. IMPRESSION: Stable  right perihilar opacity is noted concerning for atelectasis and possible underlying neoplasm. No pneumothorax status post thoracentesis. Electronically Signed   By: Rosalene Colon M.D.   On: 12/28/2023 08:59   IR THORACENTESIS ASP PLEURAL SPACE W/IMG GUIDE Result Date: 12/28/2023 INDICATION: 68 year old male with left pleural effusion for diagnostic and therapeutic thoracentesis. EXAM: ULTRASOUND GUIDED LEFT THORACENTESIS MEDICATIONS: 10 mL 1% lidocaine  COMPLICATIONS: None immediate. PROCEDURE: An ultrasound guided thoracentesis was thoroughly discussed with the patient and questions answered. The benefits, risks, alternatives and complications were also discussed. The patient understands and wishes to proceed with the procedure. Written consent was obtained. Ultrasound was performed to localize and mark an adequate pocket of fluid in the left chest. The area was then prepped and draped in the normal sterile fashion. 1% Lidocaine  was used for local anesthesia. Under ultrasound guidance a 6 Fr Safe-T-Centesis catheter was introduced. Thoracentesis was performed. The catheter was removed and a dressing applied. FINDINGS: A total of approximately 600 mL of clear, yellow fluid was removed. Samples were sent to the laboratory as requested by the clinical team. IMPRESSION: Successful ultrasound guided left thoracentesis yielding 600 mL of pleural fluid. Performed By Lorinda Root, PA-C Electronically Signed   By: Erica Hau M.D.   On: 12/28/2023 08:46   CT HEAD WO CONTRAST ( ) Result Date: 12/27/2023 CLINICAL DATA:  Stroke, follow up repeat head CT to ensure no further strokes prior to starting [facility] for a fib/rvr EXAM: CT HEAD WITHOUT CONTRAST TECHNIQUE: Contiguous axial images were obtained from the base of the skull through the vertex without  intravenous contrast. RADIATION DOSE REDUCTION: This exam was performed according to the departmental dose-optimization program which includes automated exposure  control, adjustment of the mA and/or kV according to patient size and/or use of iterative reconstruction technique. COMPARISON:  12/06/2023 FINDINGS: Brain: Previously seen small acute to subacute infarcts on prior MRI not appreciable by CT. No acute intracranial abnormality. Specifically, no hemorrhage, hydrocephalus, mass lesion, acute infarction, or significant intracranial injury. Vascular: No hyperdense vessel or unexpected calcification. Skull: No acute calvarial abnormality. Sinuses/Orbits: No acute findings Other: None IMPRESSION: No acute intracranial abnormality. Electronically Signed   By: Janeece Mechanic M.D.   On: 12/27/2023 14:28   CT Angio Chest Pulmonary Embolism (PE) W or WO Contrast Result Date: 12/26/2023 CLINICAL DATA:  Pulmonary embolism (PE) suspected, high prob EXAM: CT ANGIOGRAPHY CHEST WITH CONTRAST TECHNIQUE: Multidetector CT imaging of the chest was performed using the standard protocol during bolus administration of intravenous contrast. Multiplanar CT image reconstructions and MIPs were obtained to evaluate the vascular anatomy. RADIATION DOSE REDUCTION: This exam was performed according to the departmental dose-optimization program which includes automated exposure control, adjustment of the mA and/or kV according to patient size and/or use of iterative reconstruction technique. CONTRAST:  75mL OMNIPAQUE  IOHEXOL  350 MG/ML SOLN COMPARISON:  Dec 26, 2023, November 17, 2023 FINDINGS: Pulmonary Embolism: No pulmonary embolism. Cardiovascular: No cardiomegaly. Moderate volume pericardial effusion. No aortic aneurysm. Mediastinum/Nodes: Enlarged left supraclavicular lymph node measuring 2.5 x 3.7 cm (axial 7). Multiple mildly enlarged right supraclavicular lymph nodes also present measuring up to 1.1 cm (axial 5). Significant enlargement of conglomerate lymph nodes along the left mediastinum/AP window, measuring 5.6 x 3.6 cm (axial 51), previously measuring 3.8 x 2.1 cm. Upper left internal  mammary lymph nodes have also increased in size in the interim measuring 4.3 by 4 cm (previously 2.2 x 2.5 cm). Increased fullness of the inferior left internal mammary lymph nodes also present. Significant progression of the bilateral hilar lymphadenopathy. For example, anterior right hilar lymph node measures 2.3 cm in short axis dimension (previously 1.7 cm). Lungs/Pleura: The trachea is midline and patent. Diffuse bronchial wall thickening. Patchy perihilar ground-glass airspace opacities in the right upper and right lower lobes and the left upper lobe, lingula, and left lower lobe. Similar size of the spiculated mass in the perihilar right upper lobe, measuring 2.7 x 2.9 cm. Worsening consolidation of the right middle lobe. Overall, similar size and number of scattered pulmonary metastases throughout both lungs. For example, the largest nodule in the superior segment of the right lower lobe measures 1.6 cm (previously 1.5 cm). No pneumothorax. Small right and small to moderate left pleural effusions. Musculoskeletal: Bony cortical destruction in the anterolateral right first rib (axial 18). Bony cortical destruction of the posterolateral left sixth rib with overlying periosteal reaction. Enlarging destructive lesion in the inferior sternum. Upper Abdomen: 1.1 cm nodule in the right adrenal gland. Review of the MIP images confirms the above findings. IMPRESSION: 1. No pulmonary embolism. Diffuse bronchial wall thickening with perihilar ground-glass airspace opacities in both lungs. This could represent changes of developing multifocal pneumonia versus early pulmonary edema. Small right and small to moderate left pleural effusions. 2. Worsening metastatic lymphadenopathy in the lower neck and chest, as delineated above. 3. Similar size and number of scattered bilateral pulmonary metastases, the largest in the right lower lobe measures 1.6 cm (previously 1.5 cm). 4. Progressive bony metastatic disease, now  involving the right anterolateral first rib. increased size of the left posterolateral sixth rib and inferior  sternal body lesions. 5. Interval development of a small nodule in the right adrenal gland, measuring 1.1 cm, worrisome for new metastatic disease. Electronically Signed   By: Rance Burrows M.D.   On: 12/26/2023 14:18   DG Chest 2 View Result Date: 12/26/2023 CLINICAL DATA:  Shortness of breath.  Known malignancy EXAM: CHEST - 2 VIEW COMPARISON:  X-ray 12/15/2023 FINDINGS: Stable right IJ chest port. Tip along the central SVC. Stable cardiopericardial silhouette with tortuous aorta ectatic aorta. Underinflated x-rays which are under penetrated. Bronchovascular crowding. The bandlike opacity right perihilar has improved from previous. Slightly more opacity at the medial right lung base however. No pneumothorax or effusion. Overlapping cardiac leads. IMPRESSION: Changing opacity. Improving right mid lung perihilar. Increasing right lung base. Recommend continued follow-up. Decreasing left lung base opacity. Electronically Signed   By: Adrianna Horde M.D.   On: 12/26/2023 12:29   ECHOCARDIOGRAM COMPLETE Result Date: 12/26/2023    ECHOCARDIOGRAM REPORT   Patient Name:   Jose Joseph Date of Exam: 12/26/2023 Medical Rec #:  [id number]     Height:       73.0 in Accession #:    [id number]    Weight:       266.0 lb Date of Birth:  [birth year]     BSA:          2.429 m Patient Age:    67 years      BP:           134/77 mmHg Patient Gender: M             HR:           114 bpm. Exam Location:  Inpatient Procedure: 2D Echo, Cardiac Doppler and Color Doppler (Both Spectral and Color            Flow Doppler were utilized during procedure). Indications:    Chemo Z09  History:        Patient has no prior history of Echocardiogram examinations.  Sonographer:    Astrid Blamer Referring Phys: [phone] Paulett Boros IMPRESSIONS  1. Left ventricular ejection fraction, by estimation, is 55 to 60%. The left ventricle has  normal function. The left ventricle has no regional wall motion abnormalities. Left ventricular diastolic parameters are consistent with Grade I diastolic dysfunction (impaired relaxation).  2. Right ventricular systolic function is normal. The right ventricular size is normal.  3. Large pericardial effusion. The pericardial effusion is circumferential. There is no evidence of cardiac tamponade.  4. The mitral valve is normal in structure. Trivial mitral valve regurgitation. No evidence of mitral stenosis.  5. The aortic valve is tricuspid. There is mild calcification of the aortic valve. Aortic valve regurgitation is not visualized. Aortic valve sclerosis/calcification is present, without any evidence of aortic stenosis.  6. The inferior vena cava is dilated in size with >50% respiratory variability, suggesting right atrial pressure of 8 mmHg. Conclusion(s)/Recommendation(s): There is a moderate to large circumfrential pericardial effusion without over tamponade. Will contact patient for cardiology follow-up. FINDINGS  Left Ventricle: Left ventricular ejection fraction, by estimation, is 55 to 60%. The left ventricle has normal function. The left ventricle has no regional wall motion abnormalities. The left ventricular internal cavity size was normal in size. There is  no left ventricular hypertrophy. Left ventricular diastolic parameters are consistent with Grade I diastolic dysfunction (impaired relaxation). Right Ventricle: The right ventricular size is normal. No increase in right ventricular wall thickness. Right ventricular systolic function is normal. Left Atrium: Left  atrial size was normal in size. Right Atrium: Right atrial size was normal in size. Pericardium: A large pericardial effusion is present. The pericardial effusion is circumferential. There is no evidence of cardiac tamponade. Mitral Valve: The mitral valve is normal in structure. Trivial mitral valve regurgitation. No evidence of mitral valve  stenosis. Tricuspid Valve: The tricuspid valve is normal in structure. Tricuspid valve regurgitation is not demonstrated. No evidence of tricuspid stenosis. Aortic Valve: The aortic valve is tricuspid. There is mild calcification of the aortic valve. Aortic valve regurgitation is not visualized. Aortic valve sclerosis/calcification is present, without any evidence of aortic stenosis. Aortic valve mean gradient measures 3.0 mmHg. Aortic valve peak gradient measures 4.7 mmHg. Aortic valve area, by VTI measures 2.53 cm. Pulmonic Valve: The pulmonic valve was not well visualized. Pulmonic valve regurgitation is not visualized. No evidence of pulmonic stenosis. Aorta: The aortic root is normal in size and structure. Venous: The inferior vena cava is dilated in size with greater than 50% respiratory variability, suggesting right atrial pressure of 8 mmHg. IAS/Shunts: No atrial level shunt detected by color flow Doppler.  LEFT VENTRICLE PLAX 2D LVIDd:         3.70 cm LVIDs:         2.70 cm LV PW:         1.00 cm LV IVS:        0.80 cm LVOT diam:     2.00 cm LV SV:         44 LV SV Index:   18 LVOT Area:     3.14 cm  RIGHT VENTRICLE RV S prime:     19.10 cm/s TAPSE (M-mode): 2.4 cm LEFT ATRIUM             Index        RIGHT ATRIUM           Index LA Vol (A2C):   31.7 ml 13.05 ml/m  RA Area:     17.00 cm LA Vol (A4C):   26.3 ml 10.83 ml/m  RA Volume:   49.70 ml  20.46 ml/m LA Biplane Vol: 28.9 ml 11.90 ml/m  AORTIC VALVE AV Area (Vmax):    2.45 cm AV Area (Vmean):   2.41 cm AV Area (VTI):     2.53 cm AV Vmax:           108.00 cm/s AV Vmean:          80.800 cm/s AV VTI:            0.175 m AV Peak Grad:      4.7 mmHg AV Mean Grad:      3.0 mmHg LVOT Vmax:         84.20 cm/s LVOT Vmean:        62.100 cm/s LVOT VTI:          0.141 m LVOT/AV VTI ratio: 0.81  AORTA Ao Root diam: 3.20 cm  SHUNTS Systemic VTI:  0.14 m Systemic Diam: 2.00 cm Jules Oar MD Electronically signed by Jules Oar MD Signature  Date/Time: 12/26/2023/10:24:16 AM    Final    DG Chest 2 View Result Date: 12/15/2023 CLINICAL DATA:  Shortness of breath EXAM: CHEST - 2 VIEW COMPARISON:  Chest radiograph November 21, 2023 FINDINGS: Port-A-Cath tip projects over the superior vena cava. Stable cardiac and mediastinal contours. Redemonstrated bandlike consolidation right mid lung. Interval development of consolidative opacities left lung base. Possible small left pleural effusion. Thoracic spine degenerative changes. No pneumothorax.  IMPRESSION: New consolidative opacity left lower lung may represent combination of pleural fluid and underlying atelectasis. Underlying infection not excluded. Bandlike consolidation right mid lung may represent atelectasis and/or associated malignant process. Electronically Signed   By: Jone Neither M.D.   On: 12/15/2023 12:30   IR IMAGING GUIDED PORT INSERTION Result Date: 12/14/2023 INDICATION: 68 year old male referred for port catheter EXAM: IMAGE GUIDED PORT CATHETER MEDICATIONS: None ANESTHESIA/SEDATION: Moderate (conscious) sedation was employed during this procedure. A total of Versed  1.0 mg and Fentanyl  50 mcg was administered intravenously. Moderate Sedation Time: 16 minutes. The patient's level of consciousness and vital signs were monitored continuously by radiology nursing throughout the procedure under my direct supervision. FLUOROSCOPY TIME:  Fluoroscopy Time:   (31 mGy). COMPLICATIONS: None PROCEDURE: Informed written consent was obtained from the patient after a discussion of the risks, benefits, and alternatives to treatment. Questions regarding the procedure were encouraged and answered. The right neck and chest were prepped with chlorhexidine  in a sterile fashion, and a sterile drape was applied covering the operative field. Maximum barrier sterile technique with sterile gowns and gloves were used for the procedure. A timeout was performed prior to the initiation of the procedure. Ultrasound survey  was performed with images stored and sent to PACs. Right IJ vein documented to be patent. The right neck and chest was prepped with chlorhexidine , and draped in the usual sterile fashion using maximum barrier technique (cap and mask, sterile gown, sterile gloves, large sterile sheet, hand hygiene and cutaneous antiseptic). Local anesthesia was attained by infiltration with 1% lidocaine  without epinephrine . Ultrasound demonstrated patency of the right internal jugular vein, and this was documented with an image. Under real-time ultrasound guidance, this vein was accessed with a 21 gauge micropuncture needle and image documentation was performed. A small dermatotomy was made at the access site with an 11 scalpel. A 0.018" wire was advanced into the SVC and used to estimate the length of the internal catheter. The access needle exchanged for a 42F micropuncture vascular sheath. The 0.018" wire was then removed and a 0.035" wire advanced into the IVC. An appropriate location for the subcutaneous reservoir was selected below the clavicle and an incision was made through the skin and underlying soft tissues. The subcutaneous tissues were then dissected using a combination of blunt and sharp surgical technique and a pocket was formed. A single lumen power injectable portacatheter was then tunneled through the subcutaneous tissues from the pocket to the dermatotomy and the port reservoir placed within the subcutaneous pocket. The venous access site was then serially dilated and a peel away vascular sheath placed over the wire. The wire was removed and the port catheter advanced into position under fluoroscopic guidance. The catheter tip is positioned in the cavoatrial junction. This was documented with a spot image. The portacatheter was then tested and found to flush and aspirate well. The port was flushed with saline followed by 100 units/mL heparinized saline. The pocket was then closed in two layers using first subdermal  inverted interrupted absorbable sutures followed by a running subcuticular suture. The epidermis was then sealed with Dermabond. The dermatotomy at the venous access site was also seal with Dermabond. Patient tolerated the procedure well and remained hemodynamically stable throughout. No complications encountered and no significant blood loss encountered IMPRESSION: Status post right IJ port catheter. Signed, Marciano Settles. Rexine Cater, RPVI [facility] Electronically Signed   By: Myrlene Asper D.O.   On: 12/14/2023 12:48  MR Brain W Wo Contrast Result Date: 12/06/2023 CLINICAL DATA:  Provided history: Malignant neoplasm of unspecified part of unspecified bronchus or lung. Non-small cell lung cancer, staging. EXAM: MRI HEAD WITHOUT AND WITH CONTRAST TECHNIQUE: Multiplanar, multiecho pulse sequences of the brain and surrounding structures were obtained without and with intravenous contrast. CONTRAST:  10mL GADAVIST  GADOBUTROL  1 MMOL/ML IV SOLN COMPARISON:  PET CT 10/19/2023. FINDINGS: Brain: Mild generalized cerebral atrophy. Punctate focus of restricted diffusion within the anterior right frontal lobe without appreciable corresponding enhancement (series 6, image 28). Multiple small foci of diffusion-weighted signal abnormality within the medial right frontoparietal cortex measuring up to 5 mm (for instance as seen on series 6, image 28). No corresponding enhancement appreciated. Punctate focus of diffusion-weighted signal abnormality within the left parietal lobe white matter without appreciable corresponding enhancement (series 6, image 33). Punctate focus of diffusion-weighted signal abnormality within the right cerebellar hemisphere without appreciable corresponding enhancement (series 6, image 13). Background multifocal T2 FLAIR hyperintense signal abnormality within the cerebral white matter (moderate) and pons (mild). Findings are nonspecific, but  most often secondary to chronic small vessel ischemia. No chronic intracranial blood products. No extra-axial fluid collection. No midline shift. Vascular: Maintained flow voids within the proximal large arterial vessels. Skull and upper cervical spine: No focal worrisome marrow lesion. Sinuses/Orbits: No mass or acute finding within the imaged orbits. 10 mm mucous retention cyst within the left maxillary sinus. Impression #1 will be called to the ordering clinician or representative by the Radiologist Assistant, and communication documented in the PACS or [employer]. IMPRESSION: 1. Small foci of diffusion-weighted signal abnormality within the anterior right frontal lobe, right parietooccipital lobes, left parietal lobe and right cerebellar hemisphere (measuring up to 5 mm). There is no appreciable corresponding pathologic enhancement at these sites, and these likely reflect acute/subacute infarcts (involvement of multiple vascular territories concerning for an embolic process). However, a short-interval follow-up brain MRI is recommended in 6 weeks to ensure expected evolution and to exclude poorly enhancing metastases. 2. Background T2 FLAIR hyperintense signal changes within the cerebral white matter (moderate) and pons (mild), nonspecific but most often secondary to chronic small vessel ischemia. 3. 10 mm left maxillary sinus mucous retention cyst. Electronically Signed   By: Bascom Lily D.O.   On: 12/06/2023 13:04    Microbiology: Results for orders placed or performed during the hospital encounter of 12/26/23  Culture, blood (Routine X 2) w Reflex to ID Panel     Status: None   Collection Time: 12/26/23  4:18 PM   Specimen: BLOOD  Result Value Ref Range Status   Specimen Description   Final    BLOOD RIGHT ANTECUBITAL Performed at [facility], [address] [place], [place] [address]    Special Requests   Final    BOTTLES DRAWN AEROBIC AND ANAEROBIC Blood Culture  results may not be optimal due to an inadequate volume of blood received in culture bottles Performed at [facility], [address], [place], [place] [address]    Culture   Final    NO GROWTH 5 DAYS Performed at [facility], [address], [place], [place] [address]    Report Status 12/31/2023 FINAL  Final  Culture, blood (Routine X 2) w Reflex to ID Panel     Status: None   Collection Time: 12/26/23  4:34 PM   Specimen: BLOOD LEFT WRIST  Result Value Ref Range Status   Specimen Description   Final  BLOOD LEFT WRIST Performed at [facility], [address], [place], [place] [address]    Special Requests   Final    BOTTLES DRAWN AEROBIC AND ANAEROBIC Blood Culture results may not be optimal due to an inadequate volume of blood received in culture bottles Performed at [facility], [address], [place], [place] [address]    Culture   Final    NO GROWTH 5 DAYS Performed at [facility], [address] [place], [place] [address]    Report Status 12/31/2023 FINAL  Final  Body fluid culture w Gram Stain     Status: None   Collection Time: 12/28/23  8:33 AM   Specimen: Lung, Left; Pleural Fluid  Result Value Ref Range Status   Specimen Description FLUID  Final   Special Requests LEFT LUNG  Final   Gram Stain NO WBC SEEN NO ORGANISMS SEEN   Final   Culture   Final    NO GROWTH 3 DAYS Performed at [facility], [address] [place], [place] [address]    Report Status 12/31/2023 FINAL  Final   Labs: CBC: Recent Labs  Lab 12/29/23 0536 12/30/23 0530 12/31/23 0445 01/01/24 0538 01/02/24 0430  WBC 11.2* 8.3 6.6 6.1 6.5  HGB 13.0 12.4* 12.4* 12.8* 12.6*  HCT 39.6 37.3* 37.5* 40.2 38.8*  MCV 92.7 92.1 91.7 93.7 93.9  PLT 168 186 187 189 185   Basic Metabolic Panel: Recent Labs  Lab 12/29/23 0536 12/30/23 0530 12/31/23 0445 01/01/24 0538 01/02/24 0430  NA 138 136 136 137 137  K 3.9 3.2* 3.8 3.9  3.9  CL 105 102 103 101 103  CO2 25 24 26 26 28   GLUCOSE 108* 105* 92 82 77  BUN 34* 36* 27* 26* 27*  CREATININE 1.10 1.19 1.20 1.38* 1.27*  CALCIUM 8.8* 8.2* 8.3* 8.8* 8.5*  MG 2.2 2.2 2.3 2.3 2.3   Liver Function Tests: Recent Labs  Lab 12/27/23 1809 01/01/24 0538  AST 17 18  ALT 22 31  ALKPHOS 57 57  BILITOT 0.9 0.5  PROT 6.6 5.9*  ALBUMIN 3.1* 2.8*   CBG: No results for input(s): "GLUCAP" in the last 168 hours.  Discharge time spent: greater than 30 minutes.  Author: Charlean Congress, MD  [facility] 01/02/2024

## 2024-01-02 NOTE — Plan of Care (Signed)
 AVS Discharge provided and questions answered.  SQ Heparin  administration reviewed with Jose Joseph and his wife, who is a retired Engineer, civil (consulting).  Meds retrieved from Main pharmacy (Chemo meds) and TOC.  IV removed and port deaccessed by IV team.   Problem: Education: Goal: Knowledge of General Education information will improve Description: Including pain rating scale, medication(s)/side effects and non-pharmacologic comfort measures Outcome: Adequate for Discharge   Problem: Health Behavior/Discharge Planning: Goal: Ability to manage health-related needs will improve Outcome: Adequate for Discharge   Problem: Clinical Measurements: Goal: Ability to maintain clinical measurements within normal limits will improve Outcome: Adequate for Discharge Goal: Will remain free from infection Outcome: Adequate for Discharge Goal: Diagnostic test results will improve Outcome: Adequate for Discharge Goal: Respiratory complications will improve Outcome: Adequate for Discharge Goal: Cardiovascular complication will be avoided Outcome: Adequate for Discharge   Problem: Activity: Goal: Risk for activity intolerance will decrease Outcome: Adequate for Discharge   Problem: Nutrition: Goal: Adequate nutrition will be maintained Outcome: Adequate for Discharge   Problem: Coping: Goal: Level of anxiety will decrease Outcome: Adequate for Discharge   Problem: Elimination: Goal: Will not experience complications related to bowel motility Outcome: Adequate for Discharge Goal: Will not experience complications related to urinary retention Outcome: Adequate for Discharge   Problem: Pain Managment: Goal: General experience of comfort will improve and/or be controlled Outcome: Adequate for Discharge   Problem: Safety: Goal: Ability to remain free from injury will improve Outcome: Adequate for Discharge   Problem: Skin Integrity: Goal: Risk for impaired skin integrity will decrease Outcome:  Adequate for Discharge

## 2024-01-03 NOTE — Transitions of Care (Post Inpatient/ED Visit) (Signed)
 01/03/2024  Patient ID: Jose Joseph, male   DOB: April 09, 1956, 68 y.o.   MRN: [id number]  Chart Review for transitions of care.  See [employer] for documentation.  Caci Orren J. Hester Joslin RN, MSN [facility] RN Care Manager Direct Dial: [phone]  Fax: [phone] Website: [email or web address]

## 2024-01-03 NOTE — Telephone Encounter (Signed)
 Oral Oncology Patient Advocate Encounter  I have received patient's income information. I am now waiting on the denial letter from the Medicare Extra Help program. Once I receive that I will be able to submit the patient assistance application.  Patient knows to submit the denial letter via MyChart message like he did for his income info.  Patty Benjaman Branch, CPhT Oncology Pharmacy Patient Advocate [facility] Direct Number: [phone] Fax: [phone]

## 2024-01-09 ENCOUNTER — Other Ambulatory Visit

## 2024-01-09 NOTE — Progress Notes (Signed)
 [facility] [address] [place] [address]   Clinic Day:  01/10/2024  Referring physician: Artemisa Bile, MD  Patient Care Team: Jose Bile, MD as PCP - General (Internal Medicine) Jose Bertrand, DO as PCP - Cardiology (Cardiology) Jose Boros, MD as Medical Oncologist (Medical Oncology) Jose Knuckles, RN as Oncology Nurse Navigator (Medical Oncology)   ASSESSMENT & PLAN:   Assessment:  1.  Stage IV adenocarcinoma of the lung to the bones: - PET/CT (10/19/2023): Bilateral lung nodules with largest right upper lobe nodule measuring 3.2 x 3 cm, bilateral hilar and mediastinal lymphadenopathy, right supraclavicular lymphadenopathy, hypermetabolic bone lesions involving sternum, left sixth rib and right iliac bone. - Bronchoscopy and biopsy on 11/21/2023 - Pathology (11/21/2023): RUL nodule FNA: Adenocarcinoma, positive for TTF-1 and negative for p40.  FNA of lymph node stations 4R,11R consistent with adenocarcinoma. - [employer] (12/06/2020): BRAF V600E, T p53, and NF2 - Caris PD-L1 (22 C3) TPS 95% - XRT to the rib and lung from 12/18/2023 through 12/29/2023 - Braftovi  (Encorafenib ) 450 mg daily and Mektovi  (binimetinib ) 45 mg twice daily started on 12/28/2023  2. Social/Family History: -Lives at home with wife. Resides in [place] 4-5 months each year. Retired from tobacco farm and Public affairs consultant. Quit tobacco use 10 years ago of 1-2 ppd for over 40 years. Possible asbestos exposure when working at [employer]. 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  -Sister died from renal cancer. Brother died from prostate cancer. Another brother died from pancreatic cancer. Maternal uncle died from lung cancer. Paternal aunt died from cancer, type unknown.  Plan:  1.  Stage IV adenocarcinoma of the lung to the bones, BRAF V600E positive: - He was hospitalized at the from 12/26/2023 through 01/02/2024 with pericardial effusion, new onset A-fib/RVR, hypertensive urgency. - He was  evaluated by Dr. Scherrie Joseph.  Radiation was discontinued and he was started on encorafenib  and binimetinib  on 12/28/2023. - His respiratory symptoms have improved. - He does not report any significant GI side effects from Braftovi  and Mektovi . - Labs today: ALT minimally elevated at 50.  Electrolytes are normal.  CBC grossly normal with mild thrombocytopenia of 142. - Continue Braftovi  and Mektovi  as directed.  RTC 3 weeks for follow-up.  May consider switching to dabrafenib and trametinib  which does not have interactions to DOAC and Cardizem  after he sees his cardiologist.  2.  Atrial fibrillation: - New onset A-fib with RVR.  S/p TEE/cardioversion on 01/01/2024.  Cardizem  and amiodarone, DOACs have interactions with Braftovi  and Mektovi .  He is on metoprolol  for rate control. - Hence he was started on Lovenox twice daily for stroke prevention. - Today in the office, he is in normal sinus rhythm.  EKG shows QTc 408 milliseconds. - Will make a referral to cardiology.  3.  Pericardial effusion: - He will need to repeat echocardiogram in 4 weeks.  4.  Abnormal brain MRI: - Brain MRI on 12/06/2023 showed small foci of diffusion weighted abnormalities.  He will need repeat MRI in 8 weeks.   Orders Placed This Encounter  Procedures   EKG 12-Lead   ECHOCARDIOGRAM COMPLETE    Standing Status:   Future    Expiration Date:   01/09/2025    Where should this test be performed:   [place]    Perflutren DEFINITY (image enhancing agent) should be administered unless hypersensitivity or allergy exist:   Administer Perflutren    Reason for exam-Echo:   Chemo  Z09   Total time spent is  40 minutes reviewing hospitalization records and formulating further plan.  Jose Joseph,acting as a Neurosurgeon for Jose Boros, MD.,have documented all relevant documentation on the behalf of Jose Boros, MD,as directed by  Jose Boros, MD while in the presence of Jose Boros, MD.  I,  Jose Boros MD, have reviewed the above documentation for accuracy and completeness, and I agree with the above.     Jose Boros, MD   5/28/20254:27 PM  CHIEF COMPLAINT/PURPOSE OF CONSULT:   Diagnosis: Metastatic adenocarcinoma of the lung to the bones   Cancer Staging  Malignant neoplasm of upper lobe of right lung [facility]) Staging form: Lung, AJCC V9 - Clinical stage from 11/30/2023: cT4, cN3(f), cM1 - Unsigned    Prior Therapy: None  Current Therapy: Braftovi  and Mektovi    HISTORY OF PRESENT ILLNESS:   Oncology History   No history exists.      Jose Joseph is a 68 y.o. male presenting to clinic today for evaluation of RUL lung adenocarcinoma at the request of Jose Ear, NP.  Patient was originally seen by pulmonology on 10/16/23 after imaging from December 2024 done in [place] showed a spiculated lung lesion of 4.3 x 3.5 cm in size with satellite lesions and smaller bilateral lung nodules. He then had an initial PET on 10/19/23 which showed: Findings are most consistent with metastatic lung cancer. There are multiple hypermetabolic pulmonary nodules bilaterally, largest a spiculated nodule inferiorly in the right upper lobe which likely represents the primary malignancy. Hypermetabolic mediastinal and hilar lymph nodes bilaterally, consistent with metastatic disease. Hypermetabolic osseous lesions involving the sternum, left 6th rib and right iliac bone, consistent with metastatic disease. No evidence of metastatic disease in the abdomen or pelvis.   Jose Joseph then had bronchoscopy with biopsies of the lung nodules under Dr. Baldwin Joseph on 11/21/23. Cytology of the RUL lung nodules revealed: adenocarcinoma with tumor cells positive for TTF-1 and negative for p40. Lymph nodes 4R and 11R biopsies showed: adenocarcinoma and lymphoid tissue identified. Lymph node 10R found: no malignant cells, but lymphocytes identified.   Today, he states that he is doing well overall. His appetite level  is at 100%. His energy level is at 50%. He is accompanied by his wife and   Jose Joseph notes he was first aware of lung cancer in January 2025 after a lung infection that required antibiotics and imaging to be done.  He has unintentionally lost 4 pounds in the last 1.5 months. He notes a normal appetite. Jose Joseph denies any prior history of MI's, CVA's, or TIA's.   He reports chest pain attributable to coughing and is treating pain with OTC ibuprofen. His coughing has worsened recently with an onset before bronchoscopy. He also notes occasional wheezing, worsened when lying down. Wheezing and coughing improve when lying on his left side. Jose Joseph also reports occasional band-like lower abdominal pain present on exertion and when lifting weights. He denies any headaches, vision changes, or ankle swellings.   Surgical history includes hip replacements, hernia repair, left knee arthroplasty, neck surgery that improved neuropathy in right shoulder, and right wrist surgery. He denies any peripheral neuropathy. Jose Joseph stays in [place] for 4-5 months of the year, as his wife has a home there.  INTERVAL HISTORY:   Jose Joseph is a 68 y.o. male presenting to the clinic today for follow-up of metastatic adenocarcinoma of right lung. He was last seen by me on 12/21/23.  Since his last visit, he was admitted to the hospital from 12/27/23 to 01/01/24 for a  pericardial effusion found during an outpatient EKG, which had improved after hospitalization. He also developed new onset A-fib with RVR treated with IV heparin , IV Cardizem  and oral metoprolol . Jose Joseph also had bilateral pleural effusion while hospitalized and underwent left thoracentesis with cytology being positive for adenocarcinoma.   Today, he states that he is doing well overall. His appetite level is at 100%. His energy level is at 75%.   PAST MEDICAL HISTORY:   Past Medical History: Past Medical History:  Diagnosis Date   Arthritis    Asthma    Atrial  fibrillation (HCC)    Cancer (HCC)    Complication of anesthesia    after his neck surgery pt. trying to get up and walk out   COPD (chronic obstructive pulmonary disease) (HCC)    Depression    History of kidney stones    2021   Hypertension    Metastatic lung carcinoma, right (HCC) 08/2023   Stroke [facility])     Surgical History: Past Surgical History:  Procedure Laterality Date   BRONCHIAL BIOPSY  11/21/2023   Procedure: BRONCHOSCOPY, WITH BIOPSY;  Surgeon: Denson Flake, MD;  Location: [facility] ENDOSCOPY;  Service: Pulmonary;;   BRONCHIAL NEEDLE ASPIRATION BIOPSY  11/21/2023   Procedure: BRONCHOSCOPY, WITH NEEDLE ASPIRATION BIOPSY;  Surgeon: Denson Flake, MD;  Location: [facility] ENDOSCOPY;  Service: Pulmonary;;   CARDIOVERSION N/A 01/01/2024   Procedure: CARDIOVERSION;  Surgeon: Luana Rumple, MD;  Location: [facility] INVASIVE CV LAB;  Service: Cardiovascular;  Laterality: N/A;   COLONOSCOPY WITH PROPOFOL  N/A 01/19/2023   Procedure: COLONOSCOPY WITH PROPOFOL ;  Surgeon: Suzette Espy, MD;  Location: [facility] ENDO SUITE;  Service: Endoscopy;  Laterality: N/A;  200pm, asa 2   ENDOBRONCHIAL ULTRASOUND Bilateral 11/21/2023   Procedure: ENDOBRONCHIAL ULTRASOUND (EBUS);  Surgeon: Denson Flake, MD;  Location: [facility] ENDOSCOPY;  Service: Pulmonary;  Laterality: Bilateral;   HERNIA REPAIR     IR IMAGING GUIDED PORT INSERTION  12/14/2023   IR THORACENTESIS ASP PLEURAL SPACE W/IMG GUIDE  12/28/2023   KNEE ARTHROCENTESIS     NECK SURGERY     POLYPECTOMY  01/19/2023   Procedure: POLYPECTOMY;  Surgeon: Suzette Espy, MD;  Location: [facility] ENDO SUITE;  Service: Endoscopy;;   TOTAL HIP ARTHROPLASTY Left 01/15/2021   Procedure: LEFT TOTAL HIP ARTHROPLASTY ANTERIOR APPROACH;  Surgeon: Adah Acron, MD;  Location: [facility] OR;  Service: Orthopedics;  Laterality: Left;   TRANSESOPHAGEAL ECHOCARDIOGRAM (CATH LAB) N/A 01/01/2024   Procedure: TRANSESOPHAGEAL ECHOCARDIOGRAM;  Surgeon: Luana Rumple, MD;  Location: [facility] INVASIVE CV  LAB;  Service: Cardiovascular;  Laterality: N/A;   VIDEO BRONCHOSCOPY WITH ENDOBRONCHIAL NAVIGATION Bilateral 11/21/2023   Procedure: VIDEO BRONCHOSCOPY WITH ENDOBRONCHIAL NAVIGATION;  Surgeon: Denson Flake, MD;  Location: [facility] ENDOSCOPY;  Service: Pulmonary;  Laterality: Bilateral;   WRIST ARTHROPLASTY      Social History: Social History   Socioeconomic History   Marital status: Married    Spouse name: Not on file   Number of children: 2   Years of education: Not on file   Highest education level: Not on file  Occupational History   Occupation: retired  Tobacco Use   Smoking status: Former    Current packs/day: 0.00    Average packs/day: 1 pack/day for 35.0 years (35.0 ttl pk-yrs)    Types: Cigarettes    Start date: 52    Quit date: 2014    Years since quitting: 11.4    Passive exposure: Past   Smokeless tobacco: Never  Vaping  Use   Vaping status: Never Used  Substance and Sexual Activity   Alcohol use: Not Currently    Alcohol/week: 2.0 standard drinks of alcohol    Types: 2 Cans of beer per week   Drug use: Yes    Types: Marijuana    Comment: last: 1 week ago   Sexual activity: Not on file  Other Topics Concern   Not on file  Social History Narrative   Not on file   Social Drivers of Health   Financial Resource Strain: Not on file  Food Insecurity: No Food Insecurity (12/27/2023)   Hunger Vital Sign    Worried About Running Out of Food in the Last Year: Never true    Ran Out of Food in the Last Year: Never true  Transportation Needs: No Transportation Needs (12/27/2023)   PRAPARE - Administrator, Civil Service (Medical): No    Lack of Transportation (Non-Medical): No  Physical Activity: Not on file  Stress: Not on file  Social Connections: Patient Declined (12/27/2023)   Social Connection and Isolation Panel [NHANES]    Frequency of Communication with Friends and Family: Patient declined    Frequency of Social Gatherings with Friends and  Family: Patient declined    Attends Religious Services: Patient declined    Database administrator or Organizations: Patient declined    Attends Banker Meetings: Patient declined    Marital Status: Patient declined  Intimate Partner Violence: Not At Risk (12/27/2023)   Humiliation, Afraid, Rape, and Kick questionnaire    Fear of Current or Ex-Partner: No    Emotionally Abused: No    Physically Abused: No    Sexually Abused: No    Family History: Family History  Problem Relation Age of Onset   Kidney cancer Sister    Prostate cancer Brother     Current Medications:  Current Outpatient Medications:    albuterol  (VENTOLIN  HFA) 108 (90 Base) MCG/ACT inhaler, Inhale 1 puff into the lungs every 4 (four) hours as needed for wheezing., Disp: 18 g, Rfl: 1   benzonatate  (TESSALON ) 100 MG capsule, Take 1 capsule (100 mg total) by mouth 3 (three) times daily., Disp: 20 capsule, Rfl: 0   binimetinib  (MEKTOVI ) 15 MG tablet, Take 3 tablets (45 mg total) by mouth 2 (two) times daily., Disp: , Rfl:    budesonide -glycopyrrolate -formoterol  (BREZTRI  AEROSPHERE) 160-9-4.8 MCG/ACT AERO inhaler, Inhale 2 puffs into the lungs in the morning and at bedtime., Disp: 10.7 g, Rfl: 1   cetirizine (ZYRTEC) 10 MG tablet, Take 10 mg by mouth in the morning., Disp: , Rfl:    citalopram  (CELEXA ) 20 MG tablet, Take 20 mg by mouth in the morning., Disp: , Rfl:    encorafenib  (BRAFTOVI ) 75 MG capsule, Take 6 capsules (450 mg total) by mouth daily., Disp: , Rfl:    enoxaparin (LOVENOX) 120 MG/0.8ML injection, Inject 0.78 mLs (117 mg total) into the skin 2 (two) times daily., Disp: 48 mL, Rfl: 0   furosemide  (LASIX ) 20 MG tablet, Take 1 tablet (20 mg total) by mouth daily as needed (for weight gain of 3 lbs in 1 day or 5 lbs in 1 week)., Disp: 30 tablet, Rfl: 0   guaiFENesin  (MUCINEX ) 600 MG 12 hr tablet, Take 2 tablets (1,200 mg total) by mouth 2 (two) times daily., Disp: 60 tablet, Rfl: 0   metoprolol   tartrate (LOPRESSOR ) 50 MG tablet, Take 1 tablet (50 mg total) by mouth 2 (two) times daily., Disp: 60 tablet,  Rfl: 0   polyethylene glycol powder (GLYCOLAX /MIRALAX ) 17 GM/SCOOP powder, Take 17 g by mouth daily as needed for mild constipation., Disp: 238 g, Rfl: 0   prochlorperazine  (COMPAZINE ) 10 MG tablet, Take 1 tablet (10 mg total) by mouth every 6 (six) hours as needed., Disp: 60 tablet, Rfl: 2   Allergies: Allergies  Allergen Reactions   Bee Venom Anaphylaxis    syncope    REVIEW OF SYSTEMS:   Review of Systems  Constitutional:  Negative for chills, fatigue and fever.  HENT:   Negative for lump/mass, mouth sores, nosebleeds, sore throat and trouble swallowing.   Eyes:  Negative for eye problems.  Respiratory:  Positive for cough and shortness of breath.   Cardiovascular:  Negative for chest pain, leg swelling and palpitations.  Gastrointestinal:  Positive for nausea. Negative for abdominal pain, constipation, diarrhea and vomiting.  Genitourinary:  Negative for bladder incontinence, difficulty urinating, dysuria, frequency, hematuria and nocturia.   Musculoskeletal:  Negative for arthralgias, back pain, flank pain, myalgias and neck pain.  Skin:  Negative for itching and rash.  Neurological:  Negative for dizziness, headaches and numbness.  Hematological:  Does not bruise/bleed easily.  Psychiatric/Behavioral:  Positive for sleep disturbance. Negative for depression and suicidal ideas. The patient is not nervous/anxious.   All other systems reviewed and are negative.    VITALS:   Blood pressure 105/68, pulse 68, temperature 97.7 F (36.5 C), temperature source Tympanic, resp. rate 17, height 6\' 1"  (1.854 m), weight 254 lb (115.2 kg), SpO2 97%.  Wt Readings from Last 3 Encounters:  01/10/24 254 lb (115.2 kg)  12/27/23 256 lb 13.4 oz (116.5 kg)  12/21/23 266 lb (120.7 kg)    Body mass index is 33.51 kg/m.  Performance status (ECOG): 1 - Symptomatic but completely  ambulatory  PHYSICAL EXAM:   Physical Exam Vitals and nursing note reviewed. Exam conducted with a chaperone present.  Constitutional:      Appearance: Normal appearance.  Cardiovascular:     Rate and Rhythm: Normal rate and regular rhythm.     Pulses: Normal pulses.     Heart sounds: Normal heart sounds.  Pulmonary:     Effort: Pulmonary effort is normal.     Breath sounds: Normal breath sounds.  Abdominal:     Palpations: Abdomen is soft. There is no hepatomegaly, splenomegaly or mass.     Tenderness: There is no abdominal tenderness.  Musculoskeletal:     Right lower leg: No edema.     Left lower leg: No edema.  Lymphadenopathy:     Cervical: No cervical adenopathy.     Right cervical: No superficial, deep or posterior cervical adenopathy.    Left cervical: No superficial, deep or posterior cervical adenopathy.     Upper Body:     Right upper body: No supraclavicular or axillary adenopathy.     Left upper body: No supraclavicular or axillary adenopathy.  Neurological:     General: No focal deficit present.     Mental Status: He is alert and oriented to person, place, and time.  Psychiatric:        Mood and Affect: Mood normal.        Behavior: Behavior normal.     LABS:   CBC    Component Value Date/Time   WBC 5.6 01/10/2024 0914   RBC 4.73 01/10/2024 0914   HGB 14.1 01/10/2024 0914   HCT 44.3 01/10/2024 0914   PLT 142 (L) 01/10/2024 0914   MCV 93.7 01/10/2024  0914   MCH 29.8 01/10/2024 0914   MCHC 31.8 01/10/2024 0914   RDW 13.2 01/10/2024 0914   LYMPHSABS 0.5 (L) 01/10/2024 0914   MONOABS 0.6 01/10/2024 0914   EOSABS 0.1 01/10/2024 0914   BASOSABS 0.0 01/10/2024 0914    CMP    Component Value Date/Time   NA 135 01/10/2024 0914   K 4.0 01/10/2024 0914   CL 100 01/10/2024 0914   CO2 24 01/10/2024 0914   GLUCOSE 98 01/10/2024 0914   BUN 21 01/10/2024 0914   CREATININE 0.90 01/10/2024 0914   CALCIUM 9.0 01/10/2024 0914   PROT 7.0 01/10/2024 0914    ALBUMIN 3.1 (L) 01/10/2024 0914   AST 33 01/10/2024 0914   ALT 50 (H) 01/10/2024 0914   ALKPHOS 108 01/10/2024 0914   BILITOT 0.3 01/10/2024 0914   GFRNONAA >60 01/10/2024 0914   GFRAA  01/08/2009 1449    >60        The eGFR has been calculated using the MDRD equation. This calculation has not been validated in all clinical situations. eGFR's persistently <60 mL/min signify possible Chronic Kidney Disease.     No results found for: "CEA1", "CEA" / No results found for: "CEA1", "CEA" No results found for: "PSA1" No results found for: "BMW413" No results found for: "CAN125"  No results found for: "TOTALPROTELP", "ALBUMINELP", "A1GS", "A2GS", "BETS", "BETA2SER", "GAMS", "MSPIKE", "SPEI" No results found for: "TIBC", "FERRITIN", "IRONPCTSAT" Lab Results  Component Value Date   LDH 201 (H) 12/29/2023     STUDIES:   ECHO TEE Result Date: 01-29-2024    TRANSESOPHOGEAL ECHO REPORT   Patient Name:   RASHED EDLER Date of Exam: 2024-01-29 Medical Rec #:  [id number]     Height:       73.0 in Accession #:    [id number]    Weight:       256.8 lb Date of Birth:  [birth year]     BSA:          2.393 m Patient Age:    67 years      BP:           123/82 mmHg Patient Gender: M             HR:           94 bpm. Exam Location:  Inpatient Procedure: Transesophageal Echo, Cardiac Doppler and Color Doppler (Both            Spectral and Color Flow Doppler were utilized during procedure). Indications:     Atrial Fibrillation  History:         Patient has prior history of Echocardiogram examinations, most                  recent 12/30/2023. COPD and Stroke, Arrythmias:Atrial                  Fibrillation, Signs/Symptoms:Dyspnea; Risk Factors:Hypertension                  and Former Smoker.  Sonographer:     Terrilee Few RCS Referring Phys:  [id number] Casimer Clear Diagnosing Phys: Luana Rumple MD PROCEDURE: After discussion of the risks and benefits of a TEE, an informed consent was obtained from the  patient. The transesophogeal probe was passed without difficulty through the esophogus of the patient. Imaged were obtained with the patient in a left lateral decubitus position. Sedation performed by different physician. The patient was monitored while under deep sedation. Anesthestetic  sedation was provided intravenously by Anesthesiology: 100mg  of Propofol , 100mg  of Lidocaine . The patient developed no complications during the procedure. A successful direct current cardioversion was performed at 200 joules with 1 attempt.  IMPRESSIONS  1. Left ventricular ejection fraction, by estimation, is 50 to 55%. The left ventricle has low normal function. The left ventricle demonstrates global hypokinesis.  2. Right ventricular systolic function is normal. The right ventricular size is normal.  3. No left atrial/left atrial appendage thrombus was detected. The LAA emptying velocity was 65 cm/s.  4. A small pericardial effusion is present. The pericardial effusion is circumferential. There is no evidence of cardiac tamponade.  5. The mitral valve is normal in structure. Trivial mitral valve regurgitation. No evidence of mitral stenosis.  6. The aortic valve is tricuspid. Aortic valve regurgitation is mild. No aortic stenosis is present.  7. There is borderline dilatation of the ascending aorta, measuring 38 mm. There is mild (Grade II) plaque involving the descending aorta. FINDINGS  Left Ventricle: Left ventricular ejection fraction, by estimation, is 50 to 55%. The left ventricle has low normal function. The left ventricle demonstrates global hypokinesis. The left ventricular internal cavity size was normal in size. There is no left ventricular hypertrophy. Right Ventricle: The right ventricular size is normal. No increase in right ventricular wall thickness. Right ventricular systolic function is normal. Left Atrium: Left atrial size was normal in size. No left atrial/left atrial appendage thrombus was detected. The LAA  emptying velocity was 65 cm/s. Right Atrium: Right atrial size was normal in size. Pericardium: A small pericardial effusion is present. The pericardial effusion is circumferential. There is no evidence of cardiac tamponade. Mitral Valve: The mitral valve is normal in structure. Trivial mitral valve regurgitation. No evidence of mitral valve stenosis. Tricuspid Valve: The tricuspid valve is normal in structure. Tricuspid valve regurgitation is mild . No evidence of tricuspid stenosis. Aortic Valve: The aortic valve is tricuspid. Aortic valve regurgitation is mild. No aortic stenosis is present. Pulmonic Valve: The pulmonic valve was grossly normal. Pulmonic valve regurgitation is trivial. No evidence of pulmonic stenosis. Aorta: The aortic root is normal in size and structure. There is borderline dilatation of the ascending aorta, measuring 38 mm. There is mild (Grade II) plaque involving the descending aorta. IAS/Shunts: No atrial level shunt detected by color flow Doppler. Additional Comments: Spectral Doppler performed. Luana Rumple MD Electronically signed by Luana Rumple MD Signature Date/Time: 01/01/2024/9:32:05 AM    Final    EP STUDY Result Date: 01/01/2024 See surgical note for result.  ECHOCARDIOGRAM LIMITED Result Date: 12/30/2023    ECHOCARDIOGRAM LIMITED REPORT   Patient Name:   MANSA WILLERS Date of Exam: 12/30/2023 Medical Rec #:  [id number]     Height:       73.0 in Accession #:    [id number]    Weight:       256.8 lb Date of Birth:  1955/11/18     BSA:          2.393 m Patient Age:    67 years      BP:           113/76 mmHg Patient Gender: M             HR:           91 bpm. Exam Location:  Inpatient Procedure: Limited Color Doppler, Cardiac Doppler and Limited Echo (Both            Spectral and Color Flow  Doppler were utilized during procedure). Indications:    pericardial effusion  History:        Patient has prior history of Echocardiogram examinations, most                 recent  12/26/2023. COPD and lung cancer, Arrythmias:Atrial                 Fibrillation; Risk Factors:Hypertension and Former Smoker.  Sonographer:    Dione Franks RDCS Referring Phys: [id number] PRANAV M PATEL IMPRESSIONS  1. Left ventricular ejection fraction, by estimation, is 55 to 60%. The left ventricle has normal function.  2. A small pericardial effusion is present. The pericardial effusion is anterior to the right ventricle. There is no evidence of cardiac tamponade.  3. The aortic valve is tricuspid. There is mild calcification of the aortic valve. Aortic valve regurgitation is not visualized.  4. The inferior vena cava is dilated in size with >50% respiratory variability, suggesting right atrial pressure of 8 mmHg. Comparison(s): Prior echo: large effusion. FINDINGS  Left Ventricle: Left ventricular ejection fraction, by estimation, is 55 to 60%. The left ventricle has normal function. Pericardium: A small pericardial effusion is present. The pericardial effusion is anterior to the right ventricle. There is no evidence of cardiac tamponade. Tricuspid Valve: Tricuspid valve regurgitation is not demonstrated. Aortic Valve: The aortic valve is tricuspid. There is mild calcification of the aortic valve. Aortic valve regurgitation is not visualized. Venous: The inferior vena cava is dilated in size with greater than 50% respiratory variability, suggesting right atrial pressure of 8 mmHg. Additional Comments: Spectral Doppler performed. Color Doppler performed.  IVC IVC diam: 2.30 cm AORTIC VALVE LVOT Vmax:   85.20 cm/s LVOT Vmean:  57.500 cm/s LVOT VTI:    0.127 m  SHUNTS Systemic VTI: 0.13 m Dorothye Gathers MD Electronically signed by Dorothye Gathers MD Signature Date/Time: 12/30/2023/4:34:03 PM    Final    DG Chest 1 View Result Date: 12/28/2023 CLINICAL DATA:  Status post thoracentesis. EXAM: CHEST  1 VIEW COMPARISON:  Dec 26, 2023. FINDINGS: Stable cardiomediastinal silhouette. Right internal jugular Port-A-Cath is  unchanged. Stable right perihilar opacity is noted concerning for atelectasis and possible underlying neoplasm. No pneumothorax status post thoracentesis. Minimal left basilar subsegmental atelectasis. Bony thorax is unremarkable. IMPRESSION: Stable right perihilar opacity is noted concerning for atelectasis and possible underlying neoplasm. No pneumothorax status post thoracentesis. Electronically Signed   By: Rosalene Colon M.D.   On: 12/28/2023 08:59   IR THORACENTESIS ASP PLEURAL SPACE W/IMG GUIDE Result Date: 12/28/2023 INDICATION: 68 year old male with left pleural effusion for diagnostic and therapeutic thoracentesis. EXAM: ULTRASOUND GUIDED LEFT THORACENTESIS MEDICATIONS: 10 mL 1% lidocaine  COMPLICATIONS: None immediate. PROCEDURE: An ultrasound guided thoracentesis was thoroughly discussed with the patient and questions answered. The benefits, risks, alternatives and complications were also discussed. The patient understands and wishes to proceed with the procedure. Written consent was obtained. Ultrasound was performed to localize and mark an adequate pocket of fluid in the left chest. The area was then prepped and draped in the normal sterile fashion. 1% Lidocaine  was used for local anesthesia. Under ultrasound guidance a 6 Fr Safe-T-Centesis catheter was introduced. Thoracentesis was performed. The catheter was removed and a dressing applied. FINDINGS: A total of approximately 600 mL of clear, yellow fluid was removed. Samples were sent to the laboratory as requested by the clinical team. IMPRESSION: Successful ultrasound guided left thoracentesis yielding 600 mL of pleural fluid. Performed By Lorinda Root,  PA-C Electronically Signed   By: Erica Hau M.D.   On: 12/28/2023 08:46   CT HEAD WO CONTRAST ( ) Result Date: 12/27/2023 CLINICAL DATA:  Stroke, follow up repeat head CT to ensure no further strokes prior to starting [facility] for a fib/rvr EXAM: CT HEAD WITHOUT CONTRAST TECHNIQUE:  Contiguous axial images were obtained from the base of the skull through the vertex without intravenous contrast. RADIATION DOSE REDUCTION: This exam was performed according to the departmental dose-optimization program which includes automated exposure control, adjustment of the mA and/or kV according to patient size and/or use of iterative reconstruction technique. COMPARISON:  12/06/2023 FINDINGS: Brain: Previously seen small acute to subacute infarcts on prior MRI not appreciable by CT. No acute intracranial abnormality. Specifically, no hemorrhage, hydrocephalus, mass lesion, acute infarction, or significant intracranial injury. Vascular: No hyperdense vessel or unexpected calcification. Skull: No acute calvarial abnormality. Sinuses/Orbits: No acute findings Other: None IMPRESSION: No acute intracranial abnormality. Electronically Signed   By: Janeece Mechanic M.D.   On: 12/27/2023 14:28   CT Angio Chest Pulmonary Embolism (PE) W or WO Contrast Result Date: 12/26/2023 CLINICAL DATA:  Pulmonary embolism (PE) suspected, high prob EXAM: CT ANGIOGRAPHY CHEST WITH CONTRAST TECHNIQUE: Multidetector CT imaging of the chest was performed using the standard protocol during bolus administration of intravenous contrast. Multiplanar CT image reconstructions and MIPs were obtained to evaluate the vascular anatomy. RADIATION DOSE REDUCTION: This exam was performed according to the departmental dose-optimization program which includes automated exposure control, adjustment of the mA and/or kV according to patient size and/or use of iterative reconstruction technique. CONTRAST:  75mL OMNIPAQUE  IOHEXOL  350 MG/ML SOLN COMPARISON:  Dec 26, 2023, November 17, 2023 FINDINGS: Pulmonary Embolism: No pulmonary embolism. Cardiovascular: No cardiomegaly. Moderate volume pericardial effusion. No aortic aneurysm. Mediastinum/Nodes: Enlarged left supraclavicular lymph node measuring 2.5 x 3.7 cm (axial 7). Multiple mildly enlarged right  supraclavicular lymph nodes also present measuring up to 1.1 cm (axial 5). Significant enlargement of conglomerate lymph nodes along the left mediastinum/AP window, measuring 5.6 x 3.6 cm (axial 51), previously measuring 3.8 x 2.1 cm. Upper left internal mammary lymph nodes have also increased in size in the interim measuring 4.3 by 4 cm (previously 2.2 x 2.5 cm). Increased fullness of the inferior left internal mammary lymph nodes also present. Significant progression of the bilateral hilar lymphadenopathy. For example, anterior right hilar lymph node measures 2.3 cm in short axis dimension (previously 1.7 cm). Lungs/Pleura: The trachea is midline and patent. Diffuse bronchial wall thickening. Patchy perihilar ground-glass airspace opacities in the right upper and right lower lobes and the left upper lobe, lingula, and left lower lobe. Similar size of the spiculated mass in the perihilar right upper lobe, measuring 2.7 x 2.9 cm. Worsening consolidation of the right middle lobe. Overall, similar size and number of scattered pulmonary metastases throughout both lungs. For example, the largest nodule in the superior segment of the right lower lobe measures 1.6 cm (previously 1.5 cm). No pneumothorax. Small right and small to moderate left pleural effusions. Musculoskeletal: Bony cortical destruction in the anterolateral right first rib (axial 18). Bony cortical destruction of the posterolateral left sixth rib with overlying periosteal reaction. Enlarging destructive lesion in the inferior sternum. Upper Abdomen: 1.1 cm nodule in the right adrenal gland. Review of the MIP images confirms the above findings. IMPRESSION: 1. No pulmonary embolism. Diffuse bronchial wall thickening with perihilar ground-glass airspace opacities in both lungs. This could represent changes of developing multifocal pneumonia versus early pulmonary edema. Small  right and small to moderate left pleural effusions. 2. Worsening metastatic  lymphadenopathy in the lower neck and chest, as delineated above. 3. Similar size and number of scattered bilateral pulmonary metastases, the largest in the right lower lobe measures 1.6 cm (previously 1.5 cm). 4. Progressive bony metastatic disease, now involving the right anterolateral first rib. increased size of the left posterolateral sixth rib and inferior sternal body lesions. 5. Interval development of a small nodule in the right adrenal gland, measuring 1.1 cm, worrisome for new metastatic disease. Electronically Signed   By: Rance Burrows M.D.   On: 12/26/2023 14:18   DG Chest 2 View Result Date: 12/26/2023 CLINICAL DATA:  Shortness of breath.  Known malignancy EXAM: CHEST - 2 VIEW COMPARISON:  X-ray 12/15/2023 FINDINGS: Stable right IJ chest port. Tip along the central SVC. Stable cardiopericardial silhouette with tortuous aorta ectatic aorta. Underinflated x-rays which are under penetrated. Bronchovascular crowding. The bandlike opacity right perihilar has improved from previous. Slightly more opacity at the medial right lung base however. No pneumothorax or effusion. Overlapping cardiac leads. IMPRESSION: Changing opacity. Improving right mid lung perihilar. Increasing right lung base. Recommend continued follow-up. Decreasing left lung base opacity. Electronically Signed   By: Adrianna Horde M.D.   On: 12/26/2023 12:29   ECHOCARDIOGRAM COMPLETE Result Date: 12/26/2023    ECHOCARDIOGRAM REPORT   Patient Name:   ARTIE MCINTYRE Date of Exam: 12/26/2023 Medical Rec #:  [id number]     Height:       73.0 in Accession #:    [id number]    Weight:       266.0 lb Date of Birth:  1956-06-13     BSA:          2.429 m Patient Age:    67 years      BP:           134/77 mmHg Patient Gender: M             HR:           114 bpm. Exam Location:  Inpatient Procedure: 2D Echo, Cardiac Doppler and Color Doppler (Both Spectral and Color            Flow Doppler were utilized during procedure). Indications:    Chemo Z09   History:        Patient has no prior history of Echocardiogram examinations.  Sonographer:    Astrid Blamer Referring Phys: [phone] Jose Joseph IMPRESSIONS  1. Left ventricular ejection fraction, by estimation, is 55 to 60%. The left ventricle has normal function. The left ventricle has no regional wall motion abnormalities. Left ventricular diastolic parameters are consistent with Grade I diastolic dysfunction (impaired relaxation).  2. Right ventricular systolic function is normal. The right ventricular size is normal.  3. Large pericardial effusion. The pericardial effusion is circumferential. There is no evidence of cardiac tamponade.  4. The mitral valve is normal in structure. Trivial mitral valve regurgitation. No evidence of mitral stenosis.  5. The aortic valve is tricuspid. There is mild calcification of the aortic valve. Aortic valve regurgitation is not visualized. Aortic valve sclerosis/calcification is present, without any evidence of aortic stenosis.  6. The inferior vena cava is dilated in size with >50% respiratory variability, suggesting right atrial pressure of 8 mmHg. Conclusion(s)/Recommendation(s): There is a moderate to large circumfrential pericardial effusion without over tamponade. Will contact patient for cardiology follow-up. FINDINGS  Left Ventricle: Left ventricular ejection fraction, by estimation, is 55 to 60%. The left  ventricle has normal function. The left ventricle has no regional wall motion abnormalities. The left ventricular internal cavity size was normal in size. There is  no left ventricular hypertrophy. Left ventricular diastolic parameters are consistent with Grade I diastolic dysfunction (impaired relaxation). Right Ventricle: The right ventricular size is normal. No increase in right ventricular wall thickness. Right ventricular systolic function is normal. Left Atrium: Left atrial size was normal in size. Right Atrium: Right atrial size was normal in size.  Pericardium: A large pericardial effusion is present. The pericardial effusion is circumferential. There is no evidence of cardiac tamponade. Mitral Valve: The mitral valve is normal in structure. Trivial mitral valve regurgitation. No evidence of mitral valve stenosis. Tricuspid Valve: The tricuspid valve is normal in structure. Tricuspid valve regurgitation is not demonstrated. No evidence of tricuspid stenosis. Aortic Valve: The aortic valve is tricuspid. There is mild calcification of the aortic valve. Aortic valve regurgitation is not visualized. Aortic valve sclerosis/calcification is present, without any evidence of aortic stenosis. Aortic valve mean gradient measures 3.0 mmHg. Aortic valve peak gradient measures 4.7 mmHg. Aortic valve area, by VTI measures 2.53 cm. Pulmonic Valve: The pulmonic valve was not well visualized. Pulmonic valve regurgitation is not visualized. No evidence of pulmonic stenosis. Aorta: The aortic root is normal in size and structure. Venous: The inferior vena cava is dilated in size with greater than 50% respiratory variability, suggesting right atrial pressure of 8 mmHg. IAS/Shunts: No atrial level shunt detected by color flow Doppler.  LEFT VENTRICLE PLAX 2D LVIDd:         3.70 cm LVIDs:         2.70 cm LV PW:         1.00 cm LV IVS:        0.80 cm LVOT diam:     2.00 cm LV SV:         44 LV SV Index:   18 LVOT Area:     3.14 cm  RIGHT VENTRICLE RV S prime:     19.10 cm/s TAPSE (M-mode): 2.4 cm LEFT ATRIUM             Index        RIGHT ATRIUM           Index LA Vol (A2C):   31.7 ml 13.05 ml/m  RA Area:     17.00 cm LA Vol (A4C):   26.3 ml 10.83 ml/m  RA Volume:   49.70 ml  20.46 ml/m LA Biplane Vol: 28.9 ml 11.90 ml/m  AORTIC VALVE AV Area (Vmax):    2.45 cm AV Area (Vmean):   2.41 cm AV Area (VTI):     2.53 cm AV Vmax:           108.00 cm/s AV Vmean:          80.800 cm/s AV VTI:            0.175 m AV Peak Grad:      4.7 mmHg AV Mean Grad:      3.0 mmHg LVOT Vmax:          84.20 cm/s LVOT Vmean:        62.100 cm/s LVOT VTI:          0.141 m LVOT/AV VTI ratio: 0.81  AORTA Ao Root diam: 3.20 cm  SHUNTS Systemic VTI:  0.14 m Systemic Diam: 2.00 cm Jules Oar MD Electronically signed by Jules Oar MD Signature Date/Time: 12/26/2023/10:24:16 AM    Final  DG Chest 2 View Result Date: 12/15/2023 CLINICAL DATA:  Shortness of breath EXAM: CHEST - 2 VIEW COMPARISON:  Chest radiograph November 21, 2023 FINDINGS: Port-A-Cath tip projects over the superior vena cava. Stable cardiac and mediastinal contours. Redemonstrated bandlike consolidation right mid lung. Interval development of consolidative opacities left lung base. Possible small left pleural effusion. Thoracic spine degenerative changes. No pneumothorax. IMPRESSION: New consolidative opacity left lower lung may represent combination of pleural fluid and underlying atelectasis. Underlying infection not excluded. Bandlike consolidation right mid lung may represent atelectasis and/or associated malignant process. Electronically Signed   By: Jone Neither M.D.   On: 12/15/2023 12:30   IR IMAGING GUIDED PORT INSERTION Result Date: 12/14/2023 INDICATION: 68 year old male referred for port catheter EXAM: IMAGE GUIDED PORT CATHETER MEDICATIONS: None ANESTHESIA/SEDATION: Moderate (conscious) sedation was employed during this procedure. A total of Versed  1.0 mg and Fentanyl  50 mcg was administered intravenously. Moderate Sedation Time: 16 minutes. The patient's level of consciousness and vital signs were monitored continuously by radiology nursing throughout the procedure under my direct supervision. FLUOROSCOPY TIME:  Fluoroscopy Time:   (31 mGy). COMPLICATIONS: None PROCEDURE: Informed written consent was obtained from the patient after a discussion of the risks, benefits, and alternatives to treatment. Questions regarding the procedure were encouraged and answered. The right neck and chest were prepped with chlorhexidine  in a  sterile fashion, and a sterile drape was applied covering the operative field. Maximum barrier sterile technique with sterile gowns and gloves were used for the procedure. A timeout was performed prior to the initiation of the procedure. Ultrasound survey was performed with images stored and sent to PACs. Right IJ vein documented to be patent. The right neck and chest was prepped with chlorhexidine , and draped in the usual sterile fashion using maximum barrier technique (cap and mask, sterile gown, sterile gloves, large sterile sheet, hand hygiene and cutaneous antiseptic). Local anesthesia was attained by infiltration with 1% lidocaine  without epinephrine . Ultrasound demonstrated patency of the right internal jugular vein, and this was documented with an image. Under real-time ultrasound guidance, this vein was accessed with a 21 gauge micropuncture needle and image documentation was performed. A small dermatotomy was made at the access site with an 11 scalpel. A 0.018" wire was advanced into the SVC and used to estimate the length of the internal catheter. The access needle exchanged for a 16F micropuncture vascular sheath. The 0.018" wire was then removed and a 0.035" wire advanced into the IVC. An appropriate location for the subcutaneous reservoir was selected below the clavicle and an incision was made through the skin and underlying soft tissues. The subcutaneous tissues were then dissected using a combination of blunt and sharp surgical technique and a pocket was formed. A single lumen power injectable portacatheter was then tunneled through the subcutaneous tissues from the pocket to the dermatotomy and the port reservoir placed within the subcutaneous pocket. The venous access site was then serially dilated and a peel away vascular sheath placed over the wire. The wire was removed and the port catheter advanced into position under fluoroscopic guidance. The catheter tip is positioned in the cavoatrial  junction. This was documented with a spot image. The portacatheter was then tested and found to flush and aspirate well. The port was flushed with saline followed by 100 units/mL heparinized saline. The pocket was then closed in two layers using first subdermal inverted interrupted absorbable sutures followed by a running subcuticular suture. The epidermis was then sealed with Dermabond. The dermatotomy at  the venous access site was also seal with Dermabond. Patient tolerated the procedure well and remained hemodynamically stable throughout. No complications encountered and no significant blood loss encountered IMPRESSION: Status post right IJ port catheter. Signed, Marciano Settles. Rexine Cater, RPVI [facility] Electronically Signed   By: Myrlene Asper D.O.   On: 12/14/2023 12:48

## 2024-01-10 ENCOUNTER — Inpatient Hospital Stay (HOSPITAL_BASED_OUTPATIENT_CLINIC_OR_DEPARTMENT_OTHER): Admitting: Hematology

## 2024-01-10 ENCOUNTER — Inpatient Hospital Stay

## 2024-01-10 ENCOUNTER — Encounter: Payer: Self-pay | Admitting: Hematology

## 2024-01-10 VITALS — BP 105/68 | HR 68 | Temp 97.7°F | Resp 17 | Ht 73.0 in | Wt 254.0 lb

## 2024-01-10 DIAGNOSIS — Z79899 Other long term (current) drug therapy: Secondary | ICD-10-CM

## 2024-01-10 DIAGNOSIS — Z87891 Personal history of nicotine dependence: Secondary | ICD-10-CM | POA: Insufficient documentation

## 2024-01-10 DIAGNOSIS — C7951 Secondary malignant neoplasm of bone: Secondary | ICD-10-CM | POA: Insufficient documentation

## 2024-01-10 DIAGNOSIS — C3491 Malignant neoplasm of unspecified part of right bronchus or lung: Secondary | ICD-10-CM

## 2024-01-10 DIAGNOSIS — D696 Thrombocytopenia, unspecified: Secondary | ICD-10-CM | POA: Insufficient documentation

## 2024-01-10 DIAGNOSIS — C3411 Malignant neoplasm of upper lobe, right bronchus or lung: Secondary | ICD-10-CM | POA: Insufficient documentation

## 2024-01-10 LAB — CBC WITH DIFFERENTIAL/PLATELET
Abs Immature Granulocytes: 0.03 10*3/uL (ref 0.00–0.07)
Basophils Absolute: 0 10*3/uL (ref 0.0–0.1)
Basophils Relative: 0 %
Eosinophils Absolute: 0.1 10*3/uL (ref 0.0–0.5)
Eosinophils Relative: 3 %
HCT: 44.3 % (ref 39.0–52.0)
Hemoglobin: 14.1 g/dL (ref 13.0–17.0)
Immature Granulocytes: 1 %
Lymphocytes Relative: 9 %
Lymphs Abs: 0.5 10*3/uL — ABNORMAL LOW (ref 0.7–4.0)
MCH: 29.8 pg (ref 26.0–34.0)
MCHC: 31.8 g/dL (ref 30.0–36.0)
MCV: 93.7 fL (ref 80.0–100.0)
Monocytes Absolute: 0.6 10*3/uL (ref 0.1–1.0)
Monocytes Relative: 10 %
Neutro Abs: 4.4 10*3/uL (ref 1.7–7.7)
Neutrophils Relative %: 77 %
Platelets: 142 10*3/uL — ABNORMAL LOW (ref 150–400)
RBC: 4.73 MIL/uL (ref 4.22–5.81)
RDW: 13.2 % (ref 11.5–15.5)
WBC: 5.6 10*3/uL (ref 4.0–10.5)
nRBC: 0 % (ref 0.0–0.2)

## 2024-01-10 LAB — COMPREHENSIVE METABOLIC PANEL WITH GFR
ALT: 50 U/L — ABNORMAL HIGH (ref 0–44)
AST: 33 U/L (ref 15–41)
Albumin: 3.1 g/dL — ABNORMAL LOW (ref 3.5–5.0)
Alkaline Phosphatase: 108 U/L (ref 38–126)
Anion gap: 11 (ref 5–15)
BUN: 21 mg/dL (ref 8–23)
CO2: 24 mmol/L (ref 22–32)
Calcium: 9 mg/dL (ref 8.9–10.3)
Chloride: 100 mmol/L (ref 98–111)
Creatinine, Ser: 0.9 mg/dL (ref 0.61–1.24)
GFR, Estimated: >60 mL/min (ref >60)
Glucose, Bld: 98 mg/dL (ref 70–99)
Potassium: 4 mmol/L (ref 3.5–5.1)
Sodium: 135 mmol/L (ref 135–145)
Total Bilirubin: 0.3 mg/dL (ref 0.0–1.2)
Total Protein: 7 g/dL (ref 6.5–8.1)

## 2024-01-10 LAB — MAGNESIUM: Magnesium: 2.1 mg/dL (ref 1.7–2.4)

## 2024-01-10 MED ORDER — HEPARIN SOD (PORK) LOCK FLUSH 100 UNIT/ML IV SOLN
500.0000 [IU] | Freq: Once | INTRAVENOUS | Status: AC
  Administered 2024-01-10: 500 [IU] via INTRAVENOUS

## 2024-01-10 MED ORDER — SODIUM CHLORIDE 0.9% FLUSH
10.0000 mL | INTRAVENOUS | Status: DC | PRN
  Administered 2024-01-10: 10 mL via INTRAVENOUS

## 2024-01-10 NOTE — Addendum Note (Signed)
 Encounter addended by: Karolynn Pack on: 01/10/2024 3:38 PM  Actions taken: Imaging Exam ended

## 2024-01-10 NOTE — Progress Notes (Signed)
 Jose Joseph presented for Portacath access and flush with labs.  Portacath located right chest wall accessed with  H 20 needle.  Good blood return present. Portacath flushed with 20ml NS and 500U/39ml Heparin  and needle removed intact. No bruising or swelling noted at the site. Procedure tolerated well and without incident.     Discharged from clinic ambulatory in stable condition. Alert and oriented x 3. F/U with [facility] as scheduled.

## 2024-01-10 NOTE — Progress Notes (Signed)
 Patient is taking Braktovi and Mektovi  as prescribed. He has not missed any doses and reports no side effects at this time.

## 2024-01-10 NOTE — Addendum Note (Signed)
 Encounter addended by: Karolynn Pack on: 01/10/2024 3:37 PM  Actions taken: Imaging Exam ended

## 2024-01-10 NOTE — Patient Instructions (Addendum)
 [facility] at [facility] Discharge Instructions   You were seen and examined today by Dr. Cheree Cords.  He reviewed the results of your lab work which are normal/stable.   We will see you back in 3 weeks.   Return as scheduled.    Thank you for choosing  Cancer Center at [facility] to provide your oncology and hematology care.  To afford each patient quality time with our provider, please arrive at least 15 minutes before your scheduled appointment time.   If you have a lab appointment with the Cancer Center please come in thru the Main Entrance and check in at the main information desk.  You need to re-schedule your appointment should you arrive 10 or more minutes late.  We strive to give you quality time with our providers, and arriving late affects you and other patients whose appointments are after yours.  Also, if you no show three or more times for appointments you may be dismissed from the clinic at the providers discretion.     Again, thank you for choosing [facility].  Our hope is that these requests will decrease the amount of time that you wait before being seen by our physicians.       _____________________________________________________________  Should you have questions after your visit to [facility], please contact our office at [phone] and follow the prompts.  Our office hours are 8:00 a.m. and 4:30 p.m. Monday - Friday.  Please note that voicemails left after 4:00 p.m. may not be returned until the following business day.  We are closed weekends and major holidays.  You do have access to a nurse 24-7, just call the main number to the clinic [phone] and do not press any options, hold on the line and a nurse will answer the phone.    For prescription refill requests, have your pharmacy contact our office and allow 72 hours.    Due to Covid, you will need to wear a mask upon entering the  hospital. If you do not have a mask, a mask will be given to you at the Main Entrance upon arrival. For doctor visits, patients may have 1 support person age 40 or older with them. For treatment visits, patients can not have anyone with them due to social distancing guidelines and our immunocompromised population.

## 2024-01-11 ENCOUNTER — Ambulatory Visit: Admitting: Oncology

## 2024-01-11 ENCOUNTER — Other Ambulatory Visit

## 2024-01-15 ENCOUNTER — Other Ambulatory Visit (HOSPITAL_COMMUNITY): Payer: Self-pay

## 2024-01-18 ENCOUNTER — Encounter: Payer: Self-pay | Admitting: Internal Medicine

## 2024-01-18 ENCOUNTER — Ambulatory Visit: Attending: Internal Medicine | Admitting: Internal Medicine

## 2024-01-18 ENCOUNTER — Telehealth (HOSPITAL_BASED_OUTPATIENT_CLINIC_OR_DEPARTMENT_OTHER): Payer: Self-pay | Admitting: Pharmacist Clinician (PhC)/ Clinical Pharmacy Specialist

## 2024-01-18 VITALS — BP 110/62 | HR 64 | Ht 73.0 in | Wt 253.0 lb

## 2024-01-18 DIAGNOSIS — I4819 Other persistent atrial fibrillation: Secondary | ICD-10-CM | POA: Diagnosis not present

## 2024-01-18 DIAGNOSIS — Z7901 Long term (current) use of anticoagulants: Secondary | ICD-10-CM | POA: Diagnosis present

## 2024-01-18 MED ORDER — METOPROLOL TARTRATE 50 MG PO TABS: 50.0000 mg | ORAL_TABLET | Freq: Two times a day (BID) | ORAL | 3 refills | Status: DC

## 2024-01-18 MED ORDER — ENOXAPARIN SODIUM 120 MG/0.8ML IJ SOSY: 120.0000 mg | PREFILLED_SYRINGE | Freq: Two times a day (BID) | INTRAMUSCULAR | 5 refills | Status: DC

## 2024-01-18 MED ORDER — FUROSEMIDE 20 MG PO TABS: 20.0000 mg | ORAL_TABLET | Freq: Every day | ORAL | 3 refills | Status: DC | PRN

## 2024-01-18 NOTE — Telephone Encounter (Signed)
-----   Message from Nurse Bailey Bolus sent at 01/18/2024 10:32 AM EDT ----- Odilia Bennett,  I am going to forward this to the pharmacy team and see if they can help with our question.   Thanks,  Luane Rumps, RN ----- Message ----- From: Casper Clement, CMA Sent: 01/18/2024  10:30 AM EDT To: Francis Irons, RN  Okay, so how do I get this refilled? It was prescribed by hospitalist and he saw cardiologist today who said refill it. ----- Message ----- From: Francis Irons, RN Sent: 01/18/2024  10:27 AM EDT To: Casper Clement, CMA  Branson Calandra,  The coumadin  clinic does not refill Lovenox  unless they are prescribed while on Warfarin.   Thanks, Luane Rumps, RN ----- Message ----- From: Casper Clement, CMA Sent: 01/18/2024  10:21 AM EDT To: [facility]  Pt needs refills on Lovenox .   Thank you

## 2024-01-18 NOTE — Progress Notes (Signed)
 Cardiology Office Note  Date: 01/18/2024   ID: Jose Joseph, [name] 11-Sep-1955, MRN [id number]  PCP:  Artemisa Bile, MD  Cardiologist:  Olinda Bertrand, DO Electrophysiologist:  None   History of Present Illness: Jose Joseph is a 68 y.o. male  Referred to cardiology clinic to establish care.  For follow-up of persistent A-fib s/p DCCV.  Admitted to [facility] in May 2025 for DOE, imaging noted malignant bilateral pleural effusion s/p thoracentesis (cytology positive for adenocarcinoma), moderate to large pericardial effusion with no intervention and new onset atrial fibrillation with RVR stabilized on diltiazem  drip and eventually underwent TEE guided DCCV.  Currently following with oncology for the management of metastatic lung cancer.  Due to interactions with chemotherapy, DOAC's are not preferred.  On subtherapeutic Lovenox  twice daily and metoprolol  tartrate 50 mg twice daily.  On the TEE, his pericardial effusion is noted to be small to medium.  He has a repeat echocardiogram scheduled next week.  Doing well.  No DOE.  No angina, palpitations, fatigue, leg swelling, syncope, dizziness.  Past Medical History:  Diagnosis Date   Arthritis    Asthma    Atrial fibrillation (HCC)    Cancer (HCC)    Complication of anesthesia    after his neck surgery pt. trying to get up and walk out   COPD (chronic obstructive pulmonary disease) (HCC)    Depression    History of kidney stones    2021   Hypertension    Metastatic lung carcinoma, right (HCC) 08/2023   Stroke [facility])     Past Surgical History:  Procedure Laterality Date   BRONCHIAL BIOPSY  11/21/2023   Procedure: BRONCHOSCOPY, WITH BIOPSY;  Surgeon: Denson Flake, MD;  Location: [facility] ENDOSCOPY;  Service: Pulmonary;;   BRONCHIAL NEEDLE ASPIRATION BIOPSY  11/21/2023   Procedure: BRONCHOSCOPY, WITH NEEDLE ASPIRATION BIOPSY;  Surgeon: Denson Flake, MD;  Location: [facility] ENDOSCOPY;  Service: Pulmonary;;   CARDIOVERSION N/A 01/01/2024   Procedure:  CARDIOVERSION;  Surgeon: Luana Rumple, MD;  Location: [facility] INVASIVE CV LAB;  Service: Cardiovascular;  Laterality: N/A;   COLONOSCOPY WITH PROPOFOL  N/A 01/19/2023   Procedure: COLONOSCOPY WITH PROPOFOL ;  Surgeon: Suzette Espy, MD;  Location: [facility] ENDO SUITE;  Service: Endoscopy;  Laterality: N/A;  200pm, asa 2   ENDOBRONCHIAL ULTRASOUND Bilateral 11/21/2023   Procedure: ENDOBRONCHIAL ULTRASOUND (EBUS);  Surgeon: Denson Flake, MD;  Location: [facility] ENDOSCOPY;  Service: Pulmonary;  Laterality: Bilateral;   HERNIA REPAIR     IR IMAGING GUIDED PORT INSERTION  12/14/2023   IR THORACENTESIS ASP PLEURAL SPACE W/IMG GUIDE  12/28/2023   KNEE ARTHROCENTESIS     NECK SURGERY     POLYPECTOMY  01/19/2023   Procedure: POLYPECTOMY;  Surgeon: Suzette Espy, MD;  Location: [facility] ENDO SUITE;  Service: Endoscopy;;   TOTAL HIP ARTHROPLASTY Left 01/15/2021   Procedure: LEFT TOTAL HIP ARTHROPLASTY ANTERIOR APPROACH;  Surgeon: Adah Acron, MD;  Location: [facility] OR;  Service: Orthopedics;  Laterality: Left;   TRANSESOPHAGEAL ECHOCARDIOGRAM (CATH LAB) N/A 01/01/2024   Procedure: TRANSESOPHAGEAL ECHOCARDIOGRAM;  Surgeon: Luana Rumple, MD;  Location: [facility] INVASIVE CV LAB;  Service: Cardiovascular;  Laterality: N/A;   VIDEO BRONCHOSCOPY WITH ENDOBRONCHIAL NAVIGATION Bilateral 11/21/2023   Procedure: VIDEO BRONCHOSCOPY WITH ENDOBRONCHIAL NAVIGATION;  Surgeon: Denson Flake, MD;  Location: [facility] ENDOSCOPY;  Service: Pulmonary;  Laterality: Bilateral;   WRIST ARTHROPLASTY      Current Outpatient Medications  Medication Sig Dispense Refill   albuterol  (VENTOLIN  HFA) 108 (90  Base) MCG/ACT inhaler Inhale 1 puff into the lungs every 4 (four) hours as needed for wheezing. 18 g 1   benzonatate  (TESSALON ) 100 MG capsule Take 1 capsule (100 mg total) by mouth 3 (three) times daily. 20 capsule 0   binimetinib  (MEKTOVI ) 15 MG tablet Take 3 tablets (45 mg total) by mouth 2 (two) times daily.     budesonide -glycopyrrolate -formoterol   (BREZTRI  AEROSPHERE) 160-9-4.8 MCG/ACT AERO inhaler Inhale 2 puffs into the lungs in the morning and at bedtime. 10.7 g 1   cetirizine (ZYRTEC) 10 MG tablet Take 10 mg by mouth in the morning.     citalopram  (CELEXA ) 20 MG tablet Take 20 mg by mouth in the morning.     encorafenib  (BRAFTOVI ) 75 MG capsule Take 6 capsules (450 mg total) by mouth daily.     enoxaparin  (LOVENOX ) 120 MG/0.8ML injection Inject 0.78 mLs (117 mg total) into the skin 2 (two) times daily. 48 mL 0   furosemide  (LASIX ) 20 MG tablet Take 1 tablet (20 mg total) by mouth daily as needed (for weight gain of 3 lbs in 1 day or 5 lbs in 1 week). 30 tablet 0   guaiFENesin  (MUCINEX ) 600 MG 12 hr tablet Take 2 tablets (1,200 mg total) by mouth 2 (two) times daily. 60 tablet 0   metoprolol  tartrate (LOPRESSOR ) 50 MG tablet Take 1 tablet (50 mg total) by mouth 2 (two) times daily. 60 tablet 0   polyethylene glycol powder (GLYCOLAX /MIRALAX ) 17 GM/SCOOP powder Take 17 g by mouth daily as needed for mild constipation. 238 g 0   prochlorperazine  (COMPAZINE ) 10 MG tablet Take 1 tablet (10 mg total) by mouth every 6 (six) hours as needed. 60 tablet 2   No current facility-administered medications for this visit.   Allergies:  Bee venom   Social History: The patient  reports that he quit smoking about 11 years ago. His smoking use included cigarettes. He started smoking about 46 years ago. He has a 35 pack-year smoking history. He has been exposed to tobacco smoke. He has never used smokeless tobacco. He reports that he does not currently use alcohol after a past usage of about 2.0 standard drinks of alcohol per week. He reports current drug use. Drug: Marijuana.   Family History: The patient's family history includes Kidney cancer in his sister; Prostate cancer in his brother.   ROS:  Please see the history of present illness. Otherwise, complete review of systems is positive for none.  All other systems are reviewed and negative.    Physical Exam: VS:  BP 110/62 (BP Location: Left Arm, Patient Position: Sitting)   Pulse 64   Ht 6\' 1"  (1.854 m)   Wt 253 lb (114.8 kg)   BMI 33.38 kg/m , BMI Body mass index is 33.38 kg/m.  Wt Readings from Last 3 Encounters:  01/18/24 253 lb (114.8 kg)  01/10/24 254 lb (115.2 kg)  12/27/23 256 lb 13.4 oz (116.5 kg)    General: Patient appears comfortable at rest. HEENT: Conjunctiva and lids normal, oropharynx clear with moist mucosa. Neck: Supple, no elevated JVP or carotid bruits, no thyromegaly. Lungs: Clear to auscultation, nonlabored breathing at rest. Cardiac: Regular rate and rhythm, no S3 or significant systolic murmur, no pericardial rub. Abdomen: Soft, nontender, no hepatomegaly, bowel sounds present, no guarding or rebound. Extremities: No pitting edema, distal pulses 2+. Skin: Warm and dry. Musculoskeletal: No kyphosis. Neuropsychiatric: Alert and oriented x3, affect grossly appropriate.  Recent Labwork: 12/27/2023: B Natriuretic Peptide 71.4; TSH  1.552 01/10/2024: ALT 50; AST 33; BUN 21; Creatinine, Ser 0.90; Hemoglobin 14.1; Magnesium 2.1; Platelets 142; Potassium 4.0; Sodium 135  No results found for: "CHOL", "TRIG", "HDL", "CHOLHDL", "VLDL", "LDLCALC", "LDLDIRECT"   Assessment and Plan:  Persistent A-fib s/p DCCV in May 2025: New diagnosis in the setting of malignant bilateral pleural effusion s/p thoracentesis and small to medium pericardial fusion.  EKG post DCCV showed normal sinus rhythm, asymptomatic.  Continue metoprolol  tartrate 50 mg twice daily and Lovenox  therapeutic.  Echocardiogram showed low normal LVEF.  Small to medium pericardial effusion: He has repeat echocardiogram scheduled next week.  Will follow-up on that.  Asymptomatic.    Medication Adjustments/Labs and Tests Ordered: Current medicines are reviewed at length with the patient today.  Concerns regarding medicines are outlined above.    Disposition:  Follow up 6  months  Signed, Shermar Friedland Beauford Bounds, MD, 01/18/2024 10:06 AM    [facility] at [facility] [address] [place], [place] [address]

## 2024-01-18 NOTE — Patient Instructions (Signed)
 Medication Instructions:  Your physician recommends that you continue on your current medications as directed. Please refer to the Current Medication list given to you today.  *If you need a refill on your cardiac medications before your next appointment, please call your pharmacy*  Lab Work: None If you have labs (blood work) drawn today and your tests are completely normal, you will receive your results only by: MyChart Message (if you have MyChart) OR A paper copy in the mail If you have any lab test that is abnormal or we need to change your treatment, we will call you to review the results.  Testing/Procedures: None  Follow-Up: At [facility], you and your health needs are our priority.  As part of our continuing mission to provide you with exceptional heart care, our providers are all part of one team.  This team includes your primary Cardiologist (physician) and Advanced Practice Providers or APPs (Physician Assistants and Nurse Practitioners) who all work together to provide you with the care you need, when you need it.  Your next appointment:   6 month(s)  Provider:   You may see Vishnu Mallipeddi, MD or one of the following Advanced Practice Providers on your designated Care Team:   [place], PA-C  Scotesia Seeley, [place] Theotis Flake, [place]     We recommend signing up for the patient portal called "MyChart".  Sign up information is provided on this After Visit Summary.  MyChart is used to connect with patients for Virtual Visits (Telemedicine).  Patients are able to view lab/test results, encounter notes, upcoming appointments, etc.  Non-urgent messages can be sent to your provider as well.   To learn more about what you can do with MyChart, go to [email or web address].   Other Instructions

## 2024-01-18 NOTE — Telephone Encounter (Signed)
 Pt on enoxaparin  2/2 interactions of chemotherapy with DOACs.  Hospital discharge noted to remove 0.02 ml and give 0.78 ml (117 mg) twice daily.   This is not a recommended practice, as too easy to remove more than the 0.02 ml and therefore under-dose patient.   Spoke with [name], he states they have been doing entire syringe.  Will adjust prescription to 120 mg q12h.

## 2024-01-24 ENCOUNTER — Other Ambulatory Visit: Payer: Self-pay

## 2024-01-24 ENCOUNTER — Other Ambulatory Visit (HOSPITAL_COMMUNITY): Payer: Self-pay

## 2024-01-24 ENCOUNTER — Telehealth: Payer: Self-pay | Admitting: Pharmacy Technician

## 2024-01-24 NOTE — Telephone Encounter (Addendum)
 Oral Oncology Patient Advocate Encounter   Was successful in securing patient a $5000 grant from [employer] (PAF) to provide copayment coverage for Braftovi  and Mektovi .  This will keep the out of pocket expense at $0.     The billing information is as follows and has been shared with [facility].   RxBin: [id number] PCN:  [id number] Member ID: [id number] Group ID: [id number] Dates of Eligibility: 08/15/2023 through 01/23/2025  Jose Joseph (Jose) Benjaman Joseph, CPhT  [facility], [place], [place], [place] Oral Chemotherapy Patient Advocate Phone: [phone]  Fax: [phone]

## 2024-01-24 NOTE — Telephone Encounter (Signed)
 Oral Oncology Patient Advocate Encounter  I contacted Mr. Ferrelli to ask if he has received his approval/denial letter for the Medicare Extra Help program and the patient stated he applied two weeks ago and has not heard a response back. I suggested he contact them via phone to ask about an update and the patient seemed hesitant but agreed to do so.  I explained to him that we would need the letter in order to figure out how to move forward so he can get more medication.  Mr. Timme voiced understanding and knows to call me once he has received an update.  Latacha Texeira (Patty) Benjaman Branch, CPhT  [facility], [place], [place], [place] Oral Chemotherapy Patient Advocate Phone: [phone]  Fax: [phone]

## 2024-01-24 NOTE — Progress Notes (Signed)
 Patient called back to refill his Braftovi  & Mektovi . Patient is switching to Mekinist  & Tafinlar  per chart. Patient will need to speak with pharmacist for initial teaching per Patty. Patty will set him up and pharmacist will follow up with patient.

## 2024-01-24 NOTE — Telephone Encounter (Signed)
 Oral Oncology Patient Advocate Encounter  Funding opened while waiting for LIS. Please see new encounter with grant information.  Jose Joseph (Patty) Benjaman Branch, CPhT  [facility], [place], [place], [place] Oral Chemotherapy Patient Advocate Phone: [phone]  Fax: [phone]

## 2024-01-25 ENCOUNTER — Other Ambulatory Visit: Payer: Self-pay | Admitting: Pharmacist

## 2024-01-25 ENCOUNTER — Other Ambulatory Visit: Payer: Self-pay

## 2024-01-25 ENCOUNTER — Other Ambulatory Visit (HOSPITAL_COMMUNITY): Payer: Self-pay

## 2024-01-25 ENCOUNTER — Other Ambulatory Visit: Payer: Self-pay | Admitting: Pharmacy Technician

## 2024-01-25 DIAGNOSIS — C3490 Malignant neoplasm of unspecified part of unspecified bronchus or lung: Secondary | ICD-10-CM

## 2024-01-25 MED ORDER — BINIMETINIB 15 MG PO TABS
45.0000 mg | ORAL_TABLET | Freq: Two times a day (BID) | ORAL | 0 refills | Status: DC
  Filled 2024-01-25: qty 180, 30d supply, fill #0

## 2024-01-25 MED ORDER — ENCORAFENIB 75 MG PO CAPS
450.0000 mg | ORAL_CAPSULE | Freq: Every day | ORAL | 0 refills | Status: DC
  Filled 2024-01-25: qty 180, 30d supply, fill #0

## 2024-01-25 NOTE — Telephone Encounter (Signed)
 Patty was able to get the patient approved for a grant so he no longer has to wait for mgf assistance approval. Per Margretta Shi, RN DR. Cheree Cords would like to keep Jose Joseph on Mektovi /Braftovi  for now and NOT switch to Mekinist Kyra Phy.   Prescriptions for Mektovi /Braftovi  sent to [facility] (Specialty)

## 2024-01-25 NOTE — Progress Notes (Signed)
 Specialty Pharmacy Refill Coordination Note  Jose Joseph is a 68 y.o. male contacted today regarding refills of specialty medication(s) Binimetinib  (MEKTOVI ); Encorafenib  (BRAFTOVI )   Patient requested [name] at [place] date: 01/26/24   Medication will be filled on 06/13.    Mili Piltz (Patty) Benjaman Branch, CPhT  [facility], [place], [place], [place] Oral Chemotherapy Patient Advocate Phone: [phone]  Fax: [phone]

## 2024-01-26 ENCOUNTER — Other Ambulatory Visit: Payer: Self-pay

## 2024-01-26 ENCOUNTER — Other Ambulatory Visit (HOSPITAL_COMMUNITY): Payer: Self-pay

## 2024-01-26 ENCOUNTER — Ambulatory Visit (HOSPITAL_COMMUNITY)
Admission: RE | Admit: 2024-01-26 | Discharge: 2024-01-26 | Disposition: A | Discharge status: Home / Self Care | Source: Ambulatory Visit | Attending: Internal Medicine | Admitting: Internal Medicine

## 2024-01-26 DIAGNOSIS — Z79899 Other long term (current) drug therapy: Secondary | ICD-10-CM | POA: Insufficient documentation

## 2024-01-26 LAB — ECHOCARDIOGRAM COMPLETE
AR max vel: 3.26 cm2
AV Area VTI: 3.63 cm2
AV Area mean vel: 3.15 cm2
AV Mean grad: 5 mmHg
AV Peak grad: 7.8 mmHg
AV Vena cont: 0.4 cm
Ao pk vel: 1.4 m/s
Area-P 1/2: 2.19 cm2
Calc EF: 51 %
MV VTI: 3.74 cm2
P 1/2 time: 565 ms
S' Lateral: 3.9 cm
Single Plane A2C EF: 54 %
Single Plane A4C EF: 50 %

## 2024-01-26 NOTE — Progress Notes (Signed)
  Radiation Oncology         [id number]) [phone] ________________________________  Name: Jose Joseph MRN: [id number]  Date of Service: 01/29/2024  DOB: 06-25-1956  Post Treatment Telephone Note  Diagnosis:  C79.51 Secondary malignant neoplasm of bone (as documented in provider EOT note)  The patient was not available for call today.   Symptoms of fatigue have improved since completing therapy.  Symptoms of skin changes have improved since completing therapy.  Symptoms of esophagitis have improved since completing therapy.  The patient has scheduled follow up with his medical oncologist Dr. Katragadda for ongoing care, and was encouraged to call if he  develops concerns or questions regarding radiation.   This concludes the interaction.  Avery Bodo, LPN

## 2024-01-29 ENCOUNTER — Ambulatory Visit
Admission: RE | Admit: 2024-01-29 | Discharge: 2024-01-29 | Disposition: A | Discharge status: Home / Self Care | Source: Ambulatory Visit | Attending: Oncology | Admitting: Oncology

## 2024-01-29 DIAGNOSIS — Z87891 Personal history of nicotine dependence: Secondary | ICD-10-CM | POA: Insufficient documentation

## 2024-01-29 DIAGNOSIS — C3411 Malignant neoplasm of upper lobe, right bronchus or lung: Secondary | ICD-10-CM | POA: Insufficient documentation

## 2024-01-29 DIAGNOSIS — C7951 Secondary malignant neoplasm of bone: Secondary | ICD-10-CM | POA: Insufficient documentation

## 2024-01-29 DIAGNOSIS — Z510 Encounter for antineoplastic radiation therapy: Secondary | ICD-10-CM | POA: Insufficient documentation

## 2024-02-05 ENCOUNTER — Inpatient Hospital Stay

## 2024-02-05 ENCOUNTER — Inpatient Hospital Stay: Attending: Hematology | Admitting: Hematology

## 2024-02-05 VITALS — BP 105/67 | HR 66 | Temp 96.7°F | Resp 20 | Wt 272.9 lb

## 2024-02-05 DIAGNOSIS — I4891 Unspecified atrial fibrillation: Secondary | ICD-10-CM | POA: Diagnosis not present

## 2024-02-05 DIAGNOSIS — C3411 Malignant neoplasm of upper lobe, right bronchus or lung: Secondary | ICD-10-CM | POA: Insufficient documentation

## 2024-02-05 DIAGNOSIS — Z87891 Personal history of nicotine dependence: Secondary | ICD-10-CM | POA: Diagnosis not present

## 2024-02-05 DIAGNOSIS — C7951 Secondary malignant neoplasm of bone: Secondary | ICD-10-CM | POA: Diagnosis not present

## 2024-02-05 DIAGNOSIS — Z7901 Long term (current) use of anticoagulants: Secondary | ICD-10-CM | POA: Insufficient documentation

## 2024-02-05 DIAGNOSIS — Z79899 Other long term (current) drug therapy: Secondary | ICD-10-CM

## 2024-02-05 DIAGNOSIS — C3491 Malignant neoplasm of unspecified part of right bronchus or lung: Secondary | ICD-10-CM

## 2024-02-05 LAB — CBC WITH DIFFERENTIAL/PLATELET
Abs Immature Granulocytes: 0.07 10*3/uL (ref 0.00–0.07)
Basophils Absolute: 0 10*3/uL (ref 0.0–0.1)
Basophils Relative: 0 %
Eosinophils Absolute: 0.1 10*3/uL (ref 0.0–0.5)
Eosinophils Relative: 2 %
HCT: 40.4 % (ref 39.0–52.0)
Hemoglobin: 13.3 g/dL (ref 13.0–17.0)
Immature Granulocytes: 1 %
Lymphocytes Relative: 16 %
Lymphs Abs: 0.9 10*3/uL (ref 0.7–4.0)
MCH: 30.7 pg (ref 26.0–34.0)
MCHC: 32.9 g/dL (ref 30.0–36.0)
MCV: 93.3 fL (ref 80.0–100.0)
Monocytes Absolute: 0.6 10*3/uL (ref 0.1–1.0)
Monocytes Relative: 11 %
Neutro Abs: 3.6 10*3/uL (ref 1.7–7.7)
Neutrophils Relative %: 70 %
Platelets: 157 10*3/uL (ref 150–400)
RBC: 4.33 MIL/uL (ref 4.22–5.81)
RDW: 13.6 % (ref 11.5–15.5)
WBC: 5.2 10*3/uL (ref 4.0–10.5)
nRBC: 0 % (ref 0.0–0.2)

## 2024-02-05 LAB — COMPREHENSIVE METABOLIC PANEL WITH GFR
ALT: 14 U/L (ref 0–44)
AST: 13 U/L — ABNORMAL LOW (ref 15–41)
Albumin: 3.2 g/dL — ABNORMAL LOW (ref 3.5–5.0)
Alkaline Phosphatase: 73 U/L (ref 38–126)
Anion gap: 7 (ref 5–15)
BUN: 21 mg/dL (ref 8–23)
CO2: 25 mmol/L (ref 22–32)
Calcium: 8.7 mg/dL — ABNORMAL LOW (ref 8.9–10.3)
Chloride: 103 mmol/L (ref 98–111)
Creatinine, Ser: 0.87 mg/dL (ref 0.61–1.24)
GFR, Estimated: >60 mL/min (ref >60)
Glucose, Bld: 102 mg/dL — ABNORMAL HIGH (ref 70–99)
Potassium: 3.7 mmol/L (ref 3.5–5.1)
Sodium: 135 mmol/L (ref 135–145)
Total Bilirubin: 0.5 mg/dL (ref 0.0–1.2)
Total Protein: 6.2 g/dL — ABNORMAL LOW (ref 6.5–8.1)

## 2024-02-05 LAB — MAGNESIUM: Magnesium: 2.2 mg/dL (ref 1.7–2.4)

## 2024-02-05 MED ORDER — SODIUM CHLORIDE 0.9% FLUSH
10.0000 mL | Freq: Once | INTRAVENOUS | Status: AC
  Administered 2024-02-05: 10 mL via INTRAVENOUS

## 2024-02-05 MED ORDER — WARFARIN SODIUM 5 MG PO TABS: 5.0000 mg | ORAL_TABLET | Freq: Every day | ORAL | 2 refills | Status: DC

## 2024-02-05 MED ORDER — HEPARIN SOD (PORK) LOCK FLUSH 100 UNIT/ML IV SOLN: 500.0000 [IU] | Freq: Once | INTRAVENOUS | Status: AC

## 2024-02-05 NOTE — Patient Instructions (Addendum)
 [facility] at [facility] Discharge Instructions   You were seen and examined today by Dr. Rogers.  He reviewed the results of your lab work which are normal/stable.   We will start you on coumadin . We will check your levels on Friday. Continue the lovenox  shots until Friday.    Return as scheduled.    Thank you for choosing [facility] at [facility] to provide your oncology and hematology care.  To afford each patient quality time with our provider, please arrive at least 15 minutes before your scheduled appointment time.   If you have a lab appointment with the Cancer Center please come in thru the Main Entrance and check in at the main information desk.  You need to re-schedule your appointment should you arrive 10 or more minutes late.  We strive to give you quality time with our providers, and arriving late affects you and other patients whose appointments are after yours.  Also, if you no show three or more times for appointments you may be dismissed from the clinic at the providers discretion.     Again, thank you for choosing [facility].  Our hope is that these requests will decrease the amount of time that you wait before being seen by our physicians.       _____________________________________________________________  Should you have questions after your visit to [facility], please contact our office at [phone] and follow the prompts.  Our office hours are 8:00 a.m. and 4:30 p.m. Monday - Friday.  Please note that voicemails left after 4:00 p.m. may not be returned until the following business day.  We are closed weekends and major holidays.  You do have access to a nurse 24-7, just call the main number to the clinic [phone] and do not press any options, hold on the line and a nurse will answer the phone.    For prescription refill requests, have your pharmacy contact our office and allow 72  hours.    Due to Covid, you will need to wear a mask upon entering the hospital. If you do not have a mask, a mask will be given to you at the Main Entrance upon arrival. For doctor visits, patients may have 1 support person age 5 or older with them. For treatment visits, patients can not have anyone with them due to social distancing guidelines and our immunocompromised population.

## 2024-02-05 NOTE — Progress Notes (Signed)
 Patient is taking Braftovi  and Mektovi  as prescribed. He has not missed any doses and reports no side effects at this time.

## 2024-02-05 NOTE — Progress Notes (Signed)
 [facility] [address], [place] [address]   Clinic Day:  02/05/2024  Referring physician: Sheryle Carwin, MD  Patient Care Team: Sheryle Carwin, MD as PCP - General (Internal Medicine) Mallipeddi, Diannah SQUIBB, MD as PCP - Cardiology (Cardiology) Rogers Hai, MD as Medical Oncologist (Medical Oncology) Celestia Joesph SQUIBB, RN as Oncology Nurse Navigator (Medical Oncology)   ASSESSMENT & PLAN:   Assessment:  1.  Stage IV adenocarcinoma of the lung to the bones: - PET/CT (10/19/2023): Bilateral lung nodules with largest right upper lobe nodule measuring 3.2 x 3 cm, bilateral hilar and mediastinal lymphadenopathy, right supraclavicular lymphadenopathy, hypermetabolic bone lesions involving sternum, left sixth rib and right iliac bone. - Bronchoscopy and biopsy on 11/21/2023 - Pathology (11/21/2023): RUL nodule FNA: Adenocarcinoma, positive for TTF-1 and negative for p40.  FNA of lymph node stations 4R,11R consistent with adenocarcinoma. - [employer]ant[employer] (12/06/2020): BRAF V600E, T p53, and NF2 - Caris PD-L1 (22 C3) TPS 95% - XRT to the rib and lung from 12/18/2023 through 12/29/2023 - Braftovi  (Encorafenib ) 450 mg daily and Mektovi  (binimetinib ) 45 mg twice daily started on 12/28/2023  2. Social/Family History: -Lives at home with wife. Resides in [place] 4-5 months each year. 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 Quit tobacco use 10 years ago of 1-2 ppd for over 40 years. Possible asbestos exposure when working at [employer]. 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  -Sister died from renal cancer. Brother died from prostate cancer. Another brother died from pancreatic cancer. Maternal uncle died from lung cancer. Paternal aunt died from cancer, type unknown.  Plan:  1.  Stage IV adenocarcinoma of the lung to the bones, BRAF V600E positive: - He's feeling well. Tolerating Bragftovi and Mektovi  very well. - labs from today: LFTs and electrolytes are WNL. CBCD  normal. -Vision changes improved. RTC 3 weeks with labs.  2.  Atrial fibrillation: - New onset A-fib with RVR.  S/p TEE/cardioversion on 01/01/2024.  Cardizem  and amiodarone, DOACs have interactions with Braftovi  and Mektovi .  He is on metoprolol  for rate control. - Reviewed Echo on 6/13: LVEF stable. No effusion. - will switch lovenox  to coumadin . He will start coumadin  5mg  tonight. Check INR Friday. If therapeutic may d/c lovenox .  3.  Pericardial effusion: - Echo from 6/13 didn't show effusion.  4.  Abnormal brain MRI: - Brain MRI on 12/06/2023 showed small foci of diffusion weighted abnormalities.  He will need repeat MRI in 8 weeks.   No orders of the defined types were placed in this encounter.    LILLETTE Verneta SAUNDERS Teague,acting as a Neurosurgeon for Hai Rogers, MD.,have documented all relevant documentation on the behalf of Hai Rogers, MD,as directed by  Hai Rogers, MD while in the presence of Hai Rogers, MD.  I, Hai Rogers MD, have reviewed the above documentation for accuracy and completeness, and I agree with the above.     [address] R Teague   6/23/20259:21 AM  CHIEF COMPLAINT/PURPOSE OF CONSULT:   Diagnosis: Metastatic adenocarcinoma of the lung to the bones   Cancer Staging  Malignant neoplasm of upper lobe of right lung [facility]) Staging form: Lung, AJCC V9 - Clinical stage from 11/30/2023: cT4, cN3(f), cM1 - Unsigned    Prior Therapy: None  Current Therapy: Braftovi  and Mektovi    HISTORY OF PRESENT ILLNESS:   Oncology History   No history exists.      Dorman is a 68 y.o. male presenting to clinic today for evaluation of RUL lung adenocarcinoma at the  request of Ruthell Domino, NP.  Patient was originally seen by pulmonology on 10/16/23 after imaging from December 2024 done in [place] showed a spiculated lung lesion of 4.3 x 3.5 cm in size with satellite lesions and smaller bilateral lung nodules. He then had an initial PET on 10/19/23  which showed: Findings are most consistent with metastatic lung cancer. There are multiple hypermetabolic pulmonary nodules bilaterally, largest a spiculated nodule inferiorly in the right upper lobe which likely represents the primary malignancy. Hypermetabolic mediastinal and hilar lymph nodes bilaterally, consistent with metastatic disease. Hypermetabolic osseous lesions involving the sternum, left 6th rib and right iliac bone, consistent with metastatic disease. No evidence of metastatic disease in the abdomen or pelvis.   Caelin then had bronchoscopy with biopsies of the lung nodules under Dr. Shelah on 11/21/23. Cytology of the RUL lung nodules revealed: adenocarcinoma with tumor cells positive for TTF-1 and negative for p40. Lymph nodes 4R and 11R biopsies showed: adenocarcinoma and lymphoid tissue identified. Lymph node 10R found: no malignant cells, but lymphocytes identified.   Today, he states that he is doing well overall. His appetite level is at 100%. His energy level is at 50%. He is accompanied by his wife and   Julis notes he was first aware of lung cancer in January 2025 after a lung infection that required antibiotics and imaging to be done.  He has unintentionally lost 4 pounds in the last 1.5 months. He notes a normal appetite. Karem denies any prior history of MI's, CVA's, or TIA's.   He reports chest pain attributable to coughing and is treating pain with OTC ibuprofen. His coughing has worsened recently with an onset before bronchoscopy. He also notes occasional wheezing, worsened when lying down. Wheezing and coughing improve when lying on his left side. Daimian also reports occasional band-like lower abdominal pain present on exertion and when lifting weights. He denies any headaches, vision changes, or ankle swellings.   Surgical history includes hip replacements, hernia repair, left knee arthroplasty, neck surgery that improved neuropathy in right shoulder, and right wrist surgery. He  denies any peripheral neuropathy. Rober stays in [place] for 4-5 months of the year, as his wife has a home there.  INTERVAL HISTORY:   NATHON STEFANSKI is a 68 y.o. male presenting to the clinic today for follow-up of metastatic adenocarcinoma of right lung. He was last seen by me on 01/10/24.  Today, he states that he is doing well overall. His appetite level is at 100%. His energy level is at 75%.   PAST MEDICAL HISTORY:   Past Medical History: Past Medical History:  Diagnosis Date   Arthritis    Asthma    Atrial fibrillation (HCC)    Cancer (HCC)    Complication of anesthesia    after his neck surgery pt. trying to get up and walk out   COPD (chronic obstructive pulmonary disease) (HCC)    Depression    History of kidney stones    2021   Hypertension    Metastatic lung carcinoma, right (HCC) 08/2023   Stroke [facility])     Surgical History: Past Surgical History:  Procedure Laterality Date   BRONCHIAL BIOPSY  11/21/2023   Procedure: BRONCHOSCOPY, WITH BIOPSY;  Surgeon: Shelah Lamar RAMAN, MD;  Location: [facility] ENDOSCOPY;  Service: Pulmonary;;   BRONCHIAL NEEDLE ASPIRATION BIOPSY  11/21/2023   Procedure: BRONCHOSCOPY, WITH NEEDLE ASPIRATION BIOPSY;  Surgeon: Shelah Lamar RAMAN, MD;  Location: [facility] ENDOSCOPY;  Service: Pulmonary;;   CARDIOVERSION N/A 01/01/2024  Procedure: CARDIOVERSION;  Surgeon: Francyne Headland, MD;  Location: [facility] INVASIVE CV LAB;  Service: Cardiovascular;  Laterality: N/A;   COLONOSCOPY WITH PROPOFOL  N/A 01/19/2023   Procedure: COLONOSCOPY WITH PROPOFOL ;  Surgeon: Shaaron Lamar HERO, MD;  Location: [facility] ENDO SUITE;  Service: Endoscopy;  Laterality: N/A;  200pm, asa 2   ENDOBRONCHIAL ULTRASOUND Bilateral 11/21/2023   Procedure: ENDOBRONCHIAL ULTRASOUND (EBUS);  Surgeon: Shelah Lamar RAMAN, MD;  Location: [facility] ENDOSCOPY;  Service: Pulmonary;  Laterality: Bilateral;   HERNIA REPAIR     IR IMAGING GUIDED PORT INSERTION  12/14/2023   IR THORACENTESIS ASP PLEURAL SPACE W/IMG GUIDE   12/28/2023   KNEE ARTHROCENTESIS     NECK SURGERY     POLYPECTOMY  01/19/2023   Procedure: POLYPECTOMY;  Surgeon: Shaaron Lamar HERO, MD;  Location: [facility] ENDO SUITE;  Service: Endoscopy;;   TOTAL HIP ARTHROPLASTY Left 01/15/2021   Procedure: LEFT TOTAL HIP ARTHROPLASTY ANTERIOR APPROACH;  Surgeon: Barbarann Oneil BROCKS, MD;  Location: [facility] OR;  Service: Orthopedics;  Laterality: Left;   TRANSESOPHAGEAL ECHOCARDIOGRAM (CATH LAB) N/A 01/01/2024   Procedure: TRANSESOPHAGEAL ECHOCARDIOGRAM;  Surgeon: Francyne Headland, MD;  Location: [facility] INVASIVE CV LAB;  Service: Cardiovascular;  Laterality: N/A;   VIDEO BRONCHOSCOPY WITH ENDOBRONCHIAL NAVIGATION Bilateral 11/21/2023   Procedure: VIDEO BRONCHOSCOPY WITH ENDOBRONCHIAL NAVIGATION;  Surgeon: Shelah Lamar RAMAN, MD;  Location: [facility] ENDOSCOPY;  Service: Pulmonary;  Laterality: Bilateral;   WRIST ARTHROPLASTY      Social History: Social History   Socioeconomic History   Marital status: Married    Spouse name: Not on file   Number of children: 2   Years of education: Not on file   Highest education level: Not on file  Occupational History   Occupation: retired  Tobacco Use   Smoking status: Former    Current packs/day: 0.00    Average packs/day: 1 pack/day for 35.0 years (35.0 ttl pk-yrs)    Types: Cigarettes    Start date: 54    Quit date: 2014    Years since quitting: 11.4    Passive exposure: Past   Smokeless tobacco: Never  Vaping Use   Vaping status: Never Used  Substance and Sexual Activity   Alcohol use: Not Currently    Alcohol/week: 2.0 standard drinks of alcohol    Types: 2 Cans of beer per week   Drug use: Yes    Types: Marijuana    Comment: last: 1 week ago   Sexual activity: Not on file  Other Topics Concern   Not on file  Social History Narrative   Not on file   Social Drivers of Health   Financial Resource Strain: Not on file  Food Insecurity: No Food Insecurity (12/27/2023)   Hunger Vital Sign    Worried About Running Out of Food  in the Last Year: Never true    Ran Out of Food in the Last Year: Never true  Transportation Needs: No Transportation Needs (12/27/2023)   PRAPARE - Administrator, Civil Service (Medical): No    Lack of Transportation (Non-Medical): No  Physical Activity: Not on file  Stress: Not on file  Social Connections: Patient Declined (12/27/2023)   Social Connection and Isolation Panel    Frequency of Communication with Friends and Family: Patient declined    Frequency of Social Gatherings with Friends and Family: Patient declined    Attends Religious Services: Patient declined    Database administrator or Organizations: Patient declined    Attends Banker Meetings:  Patient declined    Marital Status: Patient declined  Intimate Partner Violence: Not At Risk (12/27/2023)   Humiliation, Afraid, Rape, and Kick questionnaire    Fear of Current or Ex-Partner: No    Emotionally Abused: No    Physically Abused: No    Sexually Abused: No    Family History: Family History  Problem Relation Age of Onset   Kidney cancer Sister    Prostate cancer Brother     Current Medications:  Current Outpatient Medications:    albuterol  (VENTOLIN  HFA) 108 (90 Base) MCG/ACT inhaler, Inhale 1 puff into the lungs every 4 (four) hours as needed for wheezing., Disp: 18 g, Rfl: 1   benzonatate  (TESSALON ) 100 MG capsule, Take 1 capsule (100 mg total) by mouth 3 (three) times daily., Disp: 20 capsule, Rfl: 0   binimetinib  (MEKTOVI ) 15 MG tablet, Take 3 tablets (45 mg total) by mouth 2 (two) times daily., Disp: 180 tablet, Rfl: 0   budesonide -glycopyrrolate -formoterol  (BREZTRI  AEROSPHERE) 160-9-4.8 MCG/ACT AERO inhaler, Inhale 2 puffs into the lungs in the morning and at bedtime., Disp: 10.7 g, Rfl: 1   cetirizine (ZYRTEC) 10 MG tablet, Take 10 mg by mouth in the morning., Disp: , Rfl:    citalopram  (CELEXA ) 20 MG tablet, Take 20 mg by mouth in the morning., Disp: , Rfl:    encorafenib  (BRAFTOVI )  75 MG capsule, Take 6 capsules (450 mg total) by mouth daily., Disp: 180 capsule, Rfl: 0   enoxaparin  (LOVENOX ) 120 MG/0.8ML injection, Inject 0.8 mLs (120 mg total) into the skin 2 (two) times daily., Disp: 60 mL, Rfl: 5   furosemide  (LASIX ) 20 MG tablet, Take 1 tablet (20 mg total) by mouth daily as needed (for weight gain of 3 lbs in 1 day or 5 lbs in 1 week)., Disp: 30 tablet, Rfl: 3   guaiFENesin  (MUCINEX ) 600 MG 12 hr tablet, Take 2 tablets (1,200 mg total) by mouth 2 (two) times daily., Disp: 60 tablet, Rfl: 0   metoprolol  tartrate (LOPRESSOR ) 50 MG tablet, Take 1 tablet (50 mg total) by mouth 2 (two) times daily., Disp: 60 tablet, Rfl: 3   polyethylene glycol powder (GLYCOLAX /MIRALAX ) 17 GM/SCOOP powder, Take 17 g by mouth daily as needed for mild constipation., Disp: 238 g, Rfl: 0   prochlorperazine  (COMPAZINE ) 10 MG tablet, Take 1 tablet (10 mg total) by mouth every 6 (six) hours as needed., Disp: 60 tablet, Rfl: 2 No current facility-administered medications for this visit.  Facility-Administered Medications Ordered in Other Visits:    heparin  lock flush 100 unit/mL, 500 Units, Intravenous, Once, Rogers Hai, MD   sodium chloride  flush (NS) 0.9 % injection 10 mL, 10 mL, Intravenous, Once, Rogers Hai, MD   Allergies: Allergies  Allergen Reactions   Bee Venom Anaphylaxis    syncope    REVIEW OF SYSTEMS:   Review of Systems  Constitutional:  Negative for chills, fatigue and fever.  HENT:   Negative for lump/mass, mouth sores, nosebleeds, sore throat and trouble swallowing.   Eyes:  Negative for eye problems.  Respiratory:  Positive for cough. Negative for shortness of breath.   Cardiovascular:  Negative for chest pain, leg swelling and palpitations.  Gastrointestinal:  Negative for abdominal pain, constipation, diarrhea, nausea and vomiting.  Genitourinary:  Negative for bladder incontinence, difficulty urinating, dysuria, frequency, hematuria and nocturia.    Musculoskeletal:  Negative for arthralgias, back pain, flank pain, myalgias and neck pain.  Skin:  Negative for itching and rash.  Neurological:  Negative for dizziness,  headaches and numbness.  Hematological:  Does not bruise/bleed easily.  Psychiatric/Behavioral:  Negative for depression, sleep disturbance and suicidal ideas. The patient is not nervous/anxious.   All other systems reviewed and are negative.    VITALS:   There were no vitals taken for this visit.  Wt Readings from Last 3 Encounters:  02/05/24 272 lb 14.9 oz (123.8 kg)  01/18/24 253 lb (114.8 kg)  01/10/24 254 lb (115.2 kg)    There is no height or weight on file to calculate BMI.  Performance status (ECOG): 1 - Symptomatic but completely ambulatory  PHYSICAL EXAM:   Physical Exam Vitals and nursing note reviewed. Exam conducted with a chaperone present.  Constitutional:      Appearance: Normal appearance.   Cardiovascular:     Rate and Rhythm: Normal rate and regular rhythm.     Pulses: Normal pulses.     Heart sounds: Normal heart sounds.  Pulmonary:     Effort: Pulmonary effort is normal.     Breath sounds: Normal breath sounds.  Abdominal:     Palpations: Abdomen is soft. There is no hepatomegaly, splenomegaly or mass.     Tenderness: There is no abdominal tenderness.   Musculoskeletal:     Right lower leg: No edema.     Left lower leg: No edema.  Lymphadenopathy:     Cervical: No cervical adenopathy.     Right cervical: No superficial, deep or posterior cervical adenopathy.    Left cervical: No superficial, deep or posterior cervical adenopathy.     Upper Body:     Right upper body: No supraclavicular or axillary adenopathy.     Left upper body: No supraclavicular or axillary adenopathy.   Neurological:     General: No focal deficit present.     Mental Status: He is alert and oriented to person, place, and time.   Psychiatric:        Mood and Affect: Mood normal.        Behavior: Behavior  normal.     LABS:   CBC    Component Value Date/Time   WBC 5.6 01/10/2024 0914   RBC 4.73 01/10/2024 0914   HGB 14.1 01/10/2024 0914   HCT 44.3 01/10/2024 0914   PLT 142 (L) 01/10/2024 0914   MCV 93.7 01/10/2024 0914   MCH 29.8 01/10/2024 0914   MCHC 31.8 01/10/2024 0914   RDW 13.2 01/10/2024 0914   LYMPHSABS 0.5 (L) 01/10/2024 0914   MONOABS 0.6 01/10/2024 0914   EOSABS 0.1 01/10/2024 0914   BASOSABS 0.0 01/10/2024 0914    CMP    Component Value Date/Time   NA 135 01/10/2024 0914   K 4.0 01/10/2024 0914   CL 100 01/10/2024 0914   CO2 24 01/10/2024 0914   GLUCOSE 98 01/10/2024 0914   BUN 21 01/10/2024 0914   CREATININE 0.90 01/10/2024 0914   CALCIUM 9.0 01/10/2024 0914   PROT 7.0 01/10/2024 0914   ALBUMIN 3.1 (L) 01/10/2024 0914   AST 33 01/10/2024 0914   ALT 50 (H) 01/10/2024 0914   ALKPHOS 108 01/10/2024 0914   BILITOT 0.3 01/10/2024 0914   GFRNONAA >60 01/10/2024 0914   GFRAA  01/08/2009 1449    >60        The eGFR has been calculated using the MDRD equation. This calculation has not been validated in all clinical situations. eGFR's persistently <60 mL/min signify possible Chronic Kidney Disease.     No results found for: CEA1, CEA / No results  found for: CEA1, CEA No results found for: PSA1 No results found for: CAN199 No results found for: CAN125  No results found for: TOTALPROTELP, ALBUMINELP, A1GS, A2GS, BETS, BETA2SER, GAMS, MSPIKE, SPEI No results found for: TIBC, FERRITIN, IRONPCTSAT Lab Results  Component Value Date   LDH 201 (H) 12/29/2023     STUDIES:   ECHOCARDIOGRAM COMPLETE Result Date: 01/26/2024    ECHOCARDIOGRAM REPORT   Patient Name:   ABDULLA POOLEY Date of Exam: 01/26/2024 Medical Rec #:  [id number]     Height:       73.0 in Accession #:    [id number]    Weight:       253.0 lb Date of Birth:  10-27-1955     BSA:          2.378 m Patient Age:    67 years      BP:           114/60 mmHg Patient  Gender: M             HR:           79 bpm. Exam Location:  [facility] Procedure: 2D Echo, 3D Echo, Cardiac Doppler and Color Doppler (Both Spectral            and Color Flow Doppler were utilized during procedure). Indications:    Drug therapy.  History:        Patient has prior history of Echocardiogram examinations, most                 recent 12/30/2023. Lung cancer, COPD and Stroke,                 Arrythmias:Atrial Fibrillation; Risk Factors:Hypertension and                 Former Smoker.  Sonographer:    Bascom Burows RCS, RVS Referring Phys: [phone] ALEAN STANDS  Sonographer Comments: Global longitudinal strain was attempted. IMPRESSIONS  1. Left ventricular ejection fraction, by estimation, is 50 to 55%. The left ventricle has low normal function. The left ventricle has no regional wall motion abnormalities. Left ventricular diastolic parameters are consistent with Grade I diastolic dysfunction (impaired relaxation).  2. Right ventricular systolic function is normal. The right ventricular size is normal.  3. The mitral valve is normal in structure. Trivial mitral valve regurgitation. No evidence of mitral stenosis.  4. The tricuspid valve is abnormal.  5. The aortic valve is tricuspid. There is mild calcification of the aortic valve. There is mild thickening of the aortic valve. Aortic valve regurgitation is mild. No aortic stenosis is present.  6. The inferior vena cava is normal in size with greater than 50% respiratory variability, suggesting right atrial pressure of 3 mmHg. Comparison(s): A prior study was performed on 12/30/2023. EF 55-60%. Small pericardial effusion present. FINDINGS  Left Ventricle: Left ventricular ejection fraction, by estimation, is 50 to 55%. The left ventricle has low normal function. The left ventricle has no regional wall motion abnormalities. Strain was performed and the global longitudinal strain is indeterminate. The left ventricular internal cavity size was normal in  size. There is no left ventricular hypertrophy. Left ventricular diastolic parameters are consistent with Grade I diastolic dysfunction (impaired relaxation). Normal left ventricular filling pressure. Right Ventricle: The right ventricular size is normal. Right vetricular wall thickness was not well visualized. Right ventricular systolic function is normal. Left Atrium: Left atrial size was normal in size. Right Atrium: Right atrial size was normal  in size. Pericardium: There is no evidence of pericardial effusion. Mitral Valve: The mitral valve is normal in structure. Trivial mitral valve regurgitation. No evidence of mitral valve stenosis. MV peak gradient, 3.0 mmHg. The mean mitral valve gradient is 1.0 mmHg. Tricuspid Valve: The tricuspid valve is abnormal. Tricuspid valve regurgitation is mild . No evidence of tricuspid stenosis. Aortic Valve: The aortic valve is tricuspid. There is mild calcification of the aortic valve. There is mild thickening of the aortic valve. There is mild aortic valve annular calcification. Aortic valve regurgitation is mild. Aortic regurgitation PHT measures 565 msec. No aortic stenosis is present. Aortic valve mean gradient measures 5.0 mmHg. Aortic valve peak gradient measures 7.8 mmHg. Aortic valve area, by VTI measures 3.63 cm. Pulmonic Valve: The pulmonic valve was not well visualized. Pulmonic valve regurgitation is mild. No evidence of pulmonic stenosis. Aorta: The aortic root and ascending aorta are structurally normal, with no evidence of dilitation. Venous: The inferior vena cava is normal in size with greater than 50% respiratory variability, suggesting right atrial pressure of 3 mmHg. IAS/Shunts: No atrial level shunt detected by color flow Doppler. Additional Comments: 3D was performed not requiring image post processing on an independent workstation and was normal.  LEFT VENTRICLE PLAX 2D LVIDd:         5.30 cm      Diastology LVIDs:         3.90 cm      LV e' medial:     6.96 cm/s LV PW:         1.00 cm      LV E/e' medial:  7.5 LV IVS:        1.00 cm      LV e' lateral:   7.83 cm/s LVOT diam:     2.30 cm      LV E/e' lateral: 6.6 LV SV:         98 LV SV Index:   41 LVOT Area:     4.15 cm                              3D Volume EF: LV Volumes (MOD)            3D EF:        57 % LV vol d, MOD A2C: 94.9 ml  LV EDV:       185 ml LV vol d, MOD A4C: 109.0 ml LV ESV:       81 ml LV vol s, MOD A2C: 43.7 ml  LV SV:        105 ml LV vol s, MOD A4C: 54.5 ml LV SV MOD A2C:     51.2 ml LV SV MOD A4C:     109.0 ml LV SV MOD BP:      51.4 ml RIGHT VENTRICLE             IVC RV Basal diam:  4.00 cm     IVC diam: 1.60 cm RV Mid diam:    3.50 cm RV S prime:     14.80 cm/s TAPSE (M-mode): 2.4 cm LEFT ATRIUM             Index        RIGHT ATRIUM           Index LA diam:        3.70 cm 1.56 cm/m   RA Area:  20.40 cm LA Vol (A2C):   70.3 ml 29.57 ml/m  RA Volume:   55.60 ml  23.38 ml/m LA Vol (A4C):   48.5 ml 20.40 ml/m LA Biplane Vol: 60.8 ml 25.57 ml/m  AORTIC VALVE                     PULMONIC VALVE AV Area (Vmax):    3.26 cm      PV Vmax:          1.01 m/s AV Area (Vmean):   3.15 cm      PV Peak grad:     4.1 mmHg AV Area (VTI):     3.63 cm      PR End Diast Vel: 4.67 msec AV Vmax:           140.00 cm/s AV Vmean:          100.000 cm/s AV VTI:            0.271 m AV Peak Grad:      7.8 mmHg AV Mean Grad:      5.0 mmHg LVOT Vmax:         110.00 cm/s LVOT Vmean:        75.900 cm/s LVOT VTI:          0.237 m LVOT/AV VTI ratio: 0.87 AI PHT:            565 msec AR Vena Contracta: 0.40 cm  AORTA Ao Root diam: 3.50 cm Ao Asc diam:  3.50 cm MITRAL VALVE               TRICUSPID VALVE MV Area (PHT): 2.19 cm    TR Peak grad:   25.0 mmHg MV Area VTI:   3.74 cm    TR Vmax:        250.00 cm/s MV Peak grad:  3.0 mmHg MV Mean grad:  1.0 mmHg    SHUNTS MV Vmax:       0.87 m/s    Systemic VTI:  0.24 m MV Vmean:      53.8 cm/s   Systemic Diam: 2.30 cm MV Decel Time: 346 msec MV E velocity: 51.90 cm/s MV A  velocity: 66.10 cm/s MV E/A ratio:  0.79 Dorn Ross MD Electronically signed by Dorn Ross MD Signature Date/Time: 01/26/2024/10:53:26 AM    Final

## 2024-02-09 ENCOUNTER — Inpatient Hospital Stay

## 2024-02-09 DIAGNOSIS — Z7901 Long term (current) use of anticoagulants: Secondary | ICD-10-CM

## 2024-02-09 DIAGNOSIS — C3411 Malignant neoplasm of upper lobe, right bronchus or lung: Secondary | ICD-10-CM | POA: Diagnosis not present

## 2024-02-09 DIAGNOSIS — Z79899 Other long term (current) drug therapy: Secondary | ICD-10-CM

## 2024-02-09 LAB — PROTIME-INR
INR: 1.1 (ref 0.8–1.2)
Prothrombin Time: 14.6 s (ref 11.4–15.2)

## 2024-02-09 MED ORDER — SODIUM CHLORIDE 0.9% FLUSH
10.0000 mL | Freq: Once | INTRAVENOUS | Status: AC
  Administered 2024-02-09: 10 mL via INTRAVENOUS

## 2024-02-09 MED ORDER — HEPARIN SOD (PORK) LOCK FLUSH 100 UNIT/ML IV SOLN
500.0000 [IU] | Freq: Once | INTRAVENOUS | Status: AC
  Administered 2024-02-09: 500 [IU] via INTRAVENOUS

## 2024-02-09 NOTE — Progress Notes (Signed)
   Patients port flushed without difficulty.  Good blood return noted with no bruising or swelling noted at site. Labs drawn per orders and gauze dressing applied.  VSS with discharge and left in satisfactory condition with no s/s of distress noted. All follow ups as scheduled.       Jaimy Kliethermes [employer]

## 2024-02-13 ENCOUNTER — Other Ambulatory Visit (HOSPITAL_COMMUNITY): Payer: Self-pay

## 2024-02-15 ENCOUNTER — Inpatient Hospital Stay: Attending: Hematology

## 2024-02-15 ENCOUNTER — Other Ambulatory Visit: Payer: Self-pay

## 2024-02-15 ENCOUNTER — Other Ambulatory Visit: Payer: Self-pay | Admitting: Hematology

## 2024-02-15 ENCOUNTER — Other Ambulatory Visit: Payer: Self-pay | Admitting: *Deleted

## 2024-02-15 ENCOUNTER — Other Ambulatory Visit (HOSPITAL_COMMUNITY): Payer: Self-pay

## 2024-02-15 ENCOUNTER — Ambulatory Visit: Payer: Self-pay | Admitting: *Deleted

## 2024-02-15 DIAGNOSIS — Z87891 Personal history of nicotine dependence: Secondary | ICD-10-CM | POA: Diagnosis not present

## 2024-02-15 DIAGNOSIS — C7951 Secondary malignant neoplasm of bone: Secondary | ICD-10-CM | POA: Diagnosis not present

## 2024-02-15 DIAGNOSIS — Z7901 Long term (current) use of anticoagulants: Secondary | ICD-10-CM

## 2024-02-15 DIAGNOSIS — C3411 Malignant neoplasm of upper lobe, right bronchus or lung: Secondary | ICD-10-CM | POA: Diagnosis present

## 2024-02-15 DIAGNOSIS — C3490 Malignant neoplasm of unspecified part of unspecified bronchus or lung: Secondary | ICD-10-CM

## 2024-02-15 LAB — PROTIME-INR
INR: 1.1 (ref 0.8–1.2)
Prothrombin Time: 14.5 s (ref 11.4–15.2)

## 2024-02-15 MED ORDER — MEKTOVI 15 MG PO TABS
45.0000 mg | ORAL_TABLET | Freq: Two times a day (BID) | ORAL | 0 refills | Status: DC
Start: 2024-02-15 — End: 2024-03-15
  Filled 2024-02-15 – 2024-02-19 (×3): qty 180, 30d supply, fill #0

## 2024-02-15 MED ORDER — BRAFTOVI 75 MG PO CAPS
450.0000 mg | ORAL_CAPSULE | Freq: Every day | ORAL | 0 refills | Status: DC
Start: 2024-02-15 — End: 2024-03-15
  Filled 2024-02-15 – 2024-02-19 (×3): qty 180, 30d supply, fill #0

## 2024-02-15 NOTE — Progress Notes (Signed)
 Per Dr. Katragadda, coumadin  dose increased to 10 mg daily and continue lovenox .  Has an appointment with coumadin  clinic on Monday and will proceed from there.  Patient verbalized understanding.

## 2024-02-15 NOTE — Telephone Encounter (Signed)
 Refill approved for encorafenib  and binimetinib .  Patient is tolerating and is to continue therapy at this time.

## 2024-02-19 ENCOUNTER — Other Ambulatory Visit: Payer: Self-pay

## 2024-02-19 ENCOUNTER — Other Ambulatory Visit (HOSPITAL_COMMUNITY): Payer: Self-pay

## 2024-02-19 ENCOUNTER — Ambulatory Visit: Attending: Cardiology | Admitting: *Deleted

## 2024-02-19 ENCOUNTER — Encounter (INDEPENDENT_AMBULATORY_CARE_PROVIDER_SITE_OTHER): Payer: Self-pay

## 2024-02-19 DIAGNOSIS — I4891 Unspecified atrial fibrillation: Secondary | ICD-10-CM | POA: Diagnosis present

## 2024-02-19 DIAGNOSIS — Z5181 Encounter for therapeutic drug level monitoring: Secondary | ICD-10-CM | POA: Insufficient documentation

## 2024-02-19 LAB — POCT INR: INR: 1.7 — AB (ref 2.0–3.0)

## 2024-02-19 MED ORDER — WARFARIN SODIUM 10 MG PO TABS
10.0000 mg | ORAL_TABLET | Freq: Every day | ORAL | 3 refills | Status: DC
Start: 2024-02-19 — End: 2024-05-03

## 2024-02-19 NOTE — Progress Notes (Signed)
Please see anticoagulation encounter.

## 2024-02-19 NOTE — Patient Instructions (Signed)
 Sent in [place] [facility] for warfarin 10mg  tablet that pt will start 1 daily when he runs out of 5mg  tablets. Continue warfarin 10mg  daily. Continue Lovenox  120mg  twice daily Recheck in 1 wk.

## 2024-02-20 ENCOUNTER — Other Ambulatory Visit (HOSPITAL_COMMUNITY): Payer: Self-pay

## 2024-02-20 ENCOUNTER — Other Ambulatory Visit: Payer: Self-pay

## 2024-02-20 ENCOUNTER — Other Ambulatory Visit: Payer: Self-pay | Admitting: Pharmacy Technician

## 2024-02-20 NOTE — Progress Notes (Signed)
 Specialty Pharmacy Refill Coordination Note  DAYQUAN BUYS is a 68 y.o. male contacted today regarding refills of specialty medication(s) Binimetinib  (MEKTOVI ); Encorafenib  (BRAFTOVI )   Patient requested (Patient-Rptd) Pickup at [facility] date: (Patient-Rptd) 02/22/24   Medication will be filled on 02/21/24.

## 2024-02-21 ENCOUNTER — Other Ambulatory Visit (HOSPITAL_COMMUNITY): Payer: Self-pay

## 2024-02-21 ENCOUNTER — Other Ambulatory Visit: Payer: Self-pay

## 2024-02-23 ENCOUNTER — Other Ambulatory Visit: Payer: Self-pay | Admitting: Hematology

## 2024-02-26 ENCOUNTER — Ambulatory Visit: Attending: Internal Medicine | Admitting: *Deleted

## 2024-02-26 DIAGNOSIS — I4891 Unspecified atrial fibrillation: Secondary | ICD-10-CM | POA: Insufficient documentation

## 2024-02-26 DIAGNOSIS — Z5181 Encounter for therapeutic drug level monitoring: Secondary | ICD-10-CM | POA: Diagnosis present

## 2024-02-26 LAB — POCT INR: INR: 2.2 (ref 2.0–3.0)

## 2024-02-26 NOTE — Patient Instructions (Signed)
 Continue warfarin 10mg  daily. Stop Lovenox  shots Recheck in 2 wk.

## 2024-02-26 NOTE — Progress Notes (Signed)
Please see anticoagulation encounter.

## 2024-02-27 ENCOUNTER — Ambulatory Visit (INDEPENDENT_AMBULATORY_CARE_PROVIDER_SITE_OTHER): Admitting: Acute Care

## 2024-02-27 ENCOUNTER — Encounter: Payer: Self-pay | Admitting: Acute Care

## 2024-02-27 VITALS — BP 98/66 | HR 64 | Temp 97.7°F | Ht 73.0 in | Wt 275.6 lb

## 2024-02-27 DIAGNOSIS — J449 Chronic obstructive pulmonary disease, unspecified: Secondary | ICD-10-CM

## 2024-02-27 DIAGNOSIS — Z87891 Personal history of nicotine dependence: Secondary | ICD-10-CM

## 2024-02-27 DIAGNOSIS — C3490 Malignant neoplasm of unspecified part of unspecified bronchus or lung: Secondary | ICD-10-CM | POA: Diagnosis not present

## 2024-02-27 DIAGNOSIS — I4891 Unspecified atrial fibrillation: Secondary | ICD-10-CM | POA: Diagnosis not present

## 2024-02-27 DIAGNOSIS — J90 Pleural effusion, not elsewhere classified: Secondary | ICD-10-CM

## 2024-02-27 DIAGNOSIS — Z9889 Other specified postprocedural states: Secondary | ICD-10-CM

## 2024-02-27 NOTE — Patient Instructions (Addendum)
 It is good to see you today. I am glad you are feeling better today, and since your hospitalization. Please continue Breztri  2 puffs in the morning and 2 puffs in the evening. Use this medication daily without fail. Rinse your mouth after use.  Use your albuterol  as needed for breakthrough shortness of breath or wheezing. Follow-up in 3 months with Dr. Shelah or Lauraine NP to evaluate how you are doing. You can always call sooner if you need us  sooner. It may benefit you to purchase an oxygen  saturation monitor. They are available at [facility] any drugstore and also on [employer]. Follow-up with oncology as scheduled. We will cancel the August 6 appointment with pulmonary as we do not feel you need it at this point. Please contact office for sooner follow up if symptoms do not improve or worsen or seek emergency care   Call if you need us  sooner.

## 2024-02-27 NOTE — Progress Notes (Signed)
 History of Present Illness Jose Joseph is a 68 y.o. male with COPD and newly diagnosed adenocarcinoma of the lung. He is followed by Jose Joseph.   Synopsis Former smoker (35 pack years), with COPD, right upper lobe pulmonary nodule and mediastinal adenopathy for which he underwent bronchoscopy with navigation and EBUS on 11/21/2023 that showed adenocarcinoma of the lung with metastasis to the mediastinum. There is also evidence for possible bone metastasis on his PET scan. He has undergone XRT x 7 rounds.He continues to take his oral cancer medications , Mektovi  and Braftovi . He continues to have follow up with Jose Joseph tomorrow, who is leaving [place] for [place] , so he will need follow up with his new oncologist after tomorrows visit.  He has had a hospital admission 12/26/2023-01/02/2024 for COPD exacerbation and for left malignant pleural effusion . He was also in a fib with RVR .He was initally on Lovenox , and has been transitioned to Warfarin. He is being followed with INR's at [facility] cardiology , Jose Joseph ,in [place]. He states he is feeling much better since discharge.    02/27/2024 Pt. Presents for follow up to assess whether he is doing well on Breztri  after being switched  from Trelegy. He states since he had the malignant effusion drained he feels much better. He is using the Breztri  almost every day.He rarely has to use his albuterol  inhaler. He does endorse some wheezing on occasion. He denies any significant shortness of breath at rest but + dyspnea with exertion. He cannot tell if the Breztri  is better than the Trelegy, but he is having good results with it, so we will continue treatment.  No weight loss. He has gained back the weight he lost in the hospital. He has all appropriate follow up. He has an oncology appointment with Jose Joseph in [place] tomorrow . He is compliant with all of his chemotherapy medications.  I have encouraged him to get an oxygen  saturation  monitor to allow him to monitor oxygen  levels. He looks great considering what he has been through with his last hospital admission. His wife is a Engineer, civil (consulting) and is taking excellent care of him.   Test Results: Left Thoracentesis >> 600 cc's drained>>  Cytology + for adenocarcinoma  CTA Chest 12/26/2023 No pulmonary embolism. Diffuse bronchial wall thickening with perihilar ground-glass airspace opacities in both lungs. This could represent changes of developing multifocal pneumonia versus early pulmonary edema. Small right and small to moderate left pleural effusions. 2. Worsening metastatic lymphadenopathy in the lower neck and chest, as delineated above. 3. Similar size and number of scattered bilateral pulmonary metastases, the largest in the right lower lobe measures 1.6 cm (previously 1.5 cm). 4. Progressive bony metastatic disease, now involving the right anterolateral first rib. increased size of the left posterolateral sixth rib and inferior sternal body lesions. 5. Interval development of a small nodule in the right adrenal gland, measuring 1.1 cm, worrisome for new metastatic disease.       Latest Ref Rng & Units 02/05/2024    9:22 AM 01/10/2024    9:14 AM 01/02/2024    4:30 AM  CBC  WBC 4.0 - 10.5 K/uL 5.2  5.6  6.5   Hemoglobin 13.0 - 17.0 g/dL 86.6  85.8  87.3   Hematocrit 39.0 - 52.0 % 40.4  44.3  38.8   Platelets 150 - 400 K/uL 157  142  185        Latest Ref Rng & Units 02/05/2024  9:22 AM 01/10/2024    9:14 AM 01/02/2024    4:30 AM  BMP  Glucose 70 - 99 mg/dL 897  98  77   BUN 8 - 23 mg/dL 21  21  27    Creatinine 0.61 - 1.24 mg/dL 9.12  9.09  8.72   Sodium 135 - 145 mmol/L 135  135  137   Potassium 3.5 - 5.1 mmol/L 3.7  4.0  3.9   Chloride 98 - 111 mmol/L 103  100  103   CO2 22 - 32 mmol/L 25  24  28    Calcium 8.9 - 10.3 mg/dL 8.7  9.0  8.5     BNP    Component Value Date/Time   BNP 71.4 12/27/2023 0408    ProBNP No results found for:  PROBNP  PFT    Component Value Date/Time   FEV1PRE 2.55 10/26/2023 0821   FEV1POST 2.54 10/26/2023 0821   FVCPRE 3.96 10/26/2023 0821   FVCPOST 4.02 10/26/2023 0821   TLC 7.65 10/26/2023 0821   DLCOUNC 32.76 10/26/2023 0821   PREFEV1FVCRT 64 10/26/2023 0821   PSTFEV1FVCRT 63 10/26/2023 0821    No results found.   Past medical hx Past Medical History:  Diagnosis Date   Arthritis    Asthma    Atrial fibrillation (HCC)    Cancer (HCC)    Complication of anesthesia    after his neck surgery pt. trying to get up and walk out   COPD (chronic obstructive pulmonary disease) (HCC)    Depression    History of kidney stones    2021   Hypertension    Metastatic lung carcinoma, right (HCC) 08/2023   Stroke (HCC)      Social History   Tobacco Use   Smoking status: Former    Current packs/day: 0.00    Average packs/day: 1 pack/day for 35.0 years (35.0 ttl pk-yrs)    Types: Cigarettes    Start date: 75    Quit date: 2014    Years since quitting: 11.5    Passive exposure: Past   Smokeless tobacco: Never  Vaping Use   Vaping status: Never Used  Substance Use Topics   Alcohol use: Not Currently    Alcohol/week: 2.0 standard drinks of alcohol    Types: 2 Cans of beer per week   Drug use: Yes    Types: Marijuana    Comment: last: 1 week ago    Jose Joseph reports that he quit smoking about 11 years ago. His smoking use included cigarettes. He started smoking about 46 years ago. He has a 35 pack-year smoking history. He has been exposed to tobacco smoke. He has never used smokeless tobacco. He reports that he does not currently use alcohol after a past usage of about 2.0 standard drinks of alcohol per week. He reports current drug use. Drug: Marijuana.  Tobacco Cessation: Counseling given: Not Answered   Past surgical hx, Family hx, Social hx all reviewed.  Current Outpatient Medications on File Prior to Visit  Medication Sig   albuterol  (VENTOLIN  HFA) 108 (90 Base)  MCG/ACT inhaler Inhale 1 puff into the lungs every 4 (four) hours as needed for wheezing.   binimetinib  (MEKTOVI ) 15 MG tablet Take 3 tablets (45 mg total) by mouth 2 (two) times daily.   budesonide -glycopyrrolate -formoterol  (BREZTRI  AEROSPHERE) 160-9-4.8 MCG/ACT AERO inhaler Inhale 2 puffs into the lungs in the morning and at bedtime.   cetirizine (ZYRTEC) 10 MG tablet Take 10 mg by mouth in the morning.  citalopram  (CELEXA ) 20 MG tablet Take 20 mg by mouth in the morning.   encorafenib  (BRAFTOVI ) 75 MG capsule Take 6 capsules (450 mg total) by mouth daily.   EPINEPHrine  0.3 mg/0.3 mL IJ SOAJ injection Inject 0.3 mg into the muscle as needed for anaphylaxis.   guaiFENesin  (MUCINEX ) 600 MG 12 hr tablet Take 2 tablets (1,200 mg total) by mouth 2 (two) times daily.   metoprolol  tartrate (LOPRESSOR ) 50 MG tablet Take 1 tablet (50 mg total) by mouth 2 (two) times daily.   prochlorperazine  (COMPAZINE ) 10 MG tablet TAKE 1 TABLET(10 MG) BY MOUTH EVERY 6 HOURS AS NEEDED   warfarin (COUMADIN ) 10 MG tablet Take 1 tablet (10 mg total) by mouth daily. Or as directed by coumadin  clinic   benzonatate  (TESSALON ) 100 MG capsule Take 1 capsule (100 mg total) by mouth 3 (three) times daily. (Patient not taking: Reported on 02/27/2024)   furosemide  (LASIX ) 20 MG tablet Take 1 tablet (20 mg total) by mouth daily as needed (for weight gain of 3 lbs in 1 day or 5 lbs in 1 week). (Patient not taking: Reported on 02/27/2024)   polyethylene glycol powder (GLYCOLAX /MIRALAX ) 17 GM/SCOOP powder Take 17 g by mouth daily as needed for mild constipation. (Patient not taking: Reported on 02/27/2024)   Current Facility-Administered Medications on File Prior to Visit  Medication   heparin  lock flush 100 unit/mL     Allergies  Allergen Reactions   Bee Venom Anaphylaxis    syncope    Review Of Systems:  Constitutional:   No  weight loss, night sweats,  Fevers, chills, fatigue, or  lassitude.  HEENT:   No headaches,   Difficulty swallowing,  Tooth/dental problems, or  Sore throat,                No sneezing, itching, ear ache, nasal congestion, post nasal drip,   CV:  No chest pain,  Orthopnea, PND, swelling in lower extremities, anasarca, dizziness, palpitations, syncope.   GI  No heartburn, indigestion, abdominal pain, nausea, vomiting, diarrhea, change in bowel habits, loss of appetite, bloody stools.   Resp: No shortness of breath with exertion or at rest.  No excess mucus, no productive cough,  No non-productive cough,  No coughing up of blood.  No change in color of mucus.  No wheezing.  No chest wall deformity  Skin: no rash or lesions.  GU: no dysuria, change in color of urine, no urgency or frequency.  No flank pain, no hematuria   MS:  No joint pain or swelling.  No decreased range of motion.  No back pain.  Psych:  No change in mood or affect. No depression or anxiety.  No memory loss.   Vital Signs BP 98/66 (BP Location: Left Arm, Patient Position: Sitting, Cuff Size: Normal)   Pulse 64   Temp 97.7 F (36.5 C) (Oral)   Ht 6' 1 (1.854 m)   Wt 275 lb 9.6 oz (125 kg)   SpO2 96%   BMI 36.36 kg/m    Physical Exam:  General- No distress,  A&Ox3, pleasant ENT: No sinus tenderness, TM clear, pale nasal mucosa, no oral exudate,no post nasal drip, no LAN Cardiac: S1, S2, regular rate and rhythm, no murmur Chest: No wheeze/ rales/ dullness; no accessory muscle use, no nasal flaring, no sternal retractions, few rhonchi Abd.: Soft Non-tender, ND, BS +, Body mass index is 36.36 kg/m.  Ext: No clubbing cyanosis, edema, no obvious deformities Neuro:  normal strength, MAE x 4,  A&O x 3, Appropriate Skin: No rashes, warm and dry, no obvious skin  lesions  Psych: normal mood and behavior   Assessment/Plan Recent COPD Flare A fib with RVR on Warfarin Adenocarcinoma of the lung, currently being treated by medical oncology with PO Mektovi  and Braftovi  Post Radiation treatment History or  recent malignant pleural effusion on the left Post thoracentesis with 600 cc pleural fluid drainage Pleural fluid + for adenocarcinoma Plan I am glad you are feeling better today, and since your hospitalization. Please continue Breztri  2 puffs in the morning and 2 puffs in the evening. Use this medication daily without fail. Rinse your mouth after use.  Use your albuterol  as needed for breakthrough shortness of breath or wheezing. Follow-up in 3 months with Jose Joseph or Lauraine NP to evaluate how you are doing. You can always call sooner if you need us  sooner. It may benefit you to purchase an oxygen  saturation monitor. They are available at [facility] any drugstore and also on [employer]. Follow-up with oncology as scheduled. Follow up with oncology regarding INR We will cancel the August 6 appointment with pulmonary as we do not feel you need it at this point. Please contact office for sooner follow up if symptoms do not improve or worsen or seek emergency care   Call if you need us  sooner.  I spent 35 minutes dedicated to the care of this patient on the date of this encounter to include pre-visit review of records, face-to-face time with the patient discussing conditions above, post visit ordering of testing, clinical documentation with the electronic health record, making appropriate referrals as documented, and communicating necessary information to the patient's healthcare team.     Lauraine JULIANNA Lites, NP 02/27/2024  9:56 AM

## 2024-02-29 ENCOUNTER — Inpatient Hospital Stay (HOSPITAL_BASED_OUTPATIENT_CLINIC_OR_DEPARTMENT_OTHER): Admitting: Hematology

## 2024-02-29 ENCOUNTER — Inpatient Hospital Stay

## 2024-02-29 VITALS — BP 90/61 | HR 66 | Temp 97.6°F | Resp 20 | Wt 274.7 lb

## 2024-02-29 DIAGNOSIS — C3491 Malignant neoplasm of unspecified part of right bronchus or lung: Secondary | ICD-10-CM | POA: Diagnosis not present

## 2024-02-29 DIAGNOSIS — Z79899 Other long term (current) drug therapy: Secondary | ICD-10-CM

## 2024-02-29 DIAGNOSIS — C3411 Malignant neoplasm of upper lobe, right bronchus or lung: Secondary | ICD-10-CM | POA: Diagnosis not present

## 2024-02-29 LAB — COMPREHENSIVE METABOLIC PANEL WITH GFR
ALT: 11 U/L (ref 0–44)
AST: 13 U/L — ABNORMAL LOW (ref 15–41)
Albumin: 3.3 g/dL — ABNORMAL LOW (ref 3.5–5.0)
Alkaline Phosphatase: 71 U/L (ref 38–126)
Anion gap: 8 (ref 5–15)
BUN: 23 mg/dL (ref 8–23)
CO2: 25 mmol/L (ref 22–32)
Calcium: 9 mg/dL (ref 8.9–10.3)
Chloride: 104 mmol/L (ref 98–111)
Creatinine, Ser: 1.03 mg/dL (ref 0.61–1.24)
GFR, Estimated: >60 mL/min (ref >60)
Glucose, Bld: 107 mg/dL — ABNORMAL HIGH (ref 70–99)
Potassium: 3.9 mmol/L (ref 3.5–5.1)
Sodium: 137 mmol/L (ref 135–145)
Total Bilirubin: 0.4 mg/dL (ref 0.0–1.2)
Total Protein: 6.9 g/dL (ref 6.5–8.1)

## 2024-02-29 LAB — CBC WITH DIFFERENTIAL/PLATELET
Abs Immature Granulocytes: 0.04 K/uL (ref 0.00–0.07)
Basophils Absolute: 0 K/uL (ref 0.0–0.1)
Basophils Relative: 0 %
Eosinophils Absolute: 0.2 K/uL (ref 0.0–0.5)
Eosinophils Relative: 3 %
HCT: 41.2 % (ref 39.0–52.0)
Hemoglobin: 13.9 g/dL (ref 13.0–17.0)
Immature Granulocytes: 1 %
Lymphocytes Relative: 12 %
Lymphs Abs: 0.8 K/uL (ref 0.7–4.0)
MCH: 31.2 pg (ref 26.0–34.0)
MCHC: 33.7 g/dL (ref 30.0–36.0)
MCV: 92.4 fL (ref 80.0–100.0)
Monocytes Absolute: 0.6 K/uL (ref 0.1–1.0)
Monocytes Relative: 9 %
Neutro Abs: 5.3 K/uL (ref 1.7–7.7)
Neutrophils Relative %: 75 %
Platelets: 172 K/uL (ref 150–400)
RBC: 4.46 MIL/uL (ref 4.22–5.81)
RDW: 14.4 % (ref 11.5–15.5)
WBC: 7 K/uL (ref 4.0–10.5)
nRBC: 0 % (ref 0.0–0.2)

## 2024-02-29 LAB — MAGNESIUM: Magnesium: 2.1 mg/dL (ref 1.7–2.4)

## 2024-02-29 MED ORDER — HEPARIN SOD (PORK) LOCK FLUSH 100 UNIT/ML IV SOLN
500.0000 [IU] | Freq: Once | INTRAVENOUS | Status: AC
  Administered 2024-02-29: 500 [IU] via INTRAVENOUS

## 2024-02-29 MED ORDER — SODIUM CHLORIDE 0.9% FLUSH
10.0000 mL | INTRAVENOUS | Status: DC | PRN
  Administered 2024-02-29: 10 mL via INTRAVENOUS

## 2024-02-29 NOTE — Progress Notes (Signed)
 Jose Joseph presented for Portacath access and flush with labs.  Portacath located right chest wall accessed with  H 20 needle.  Good blood return present. Portacath flushed with 10ml NS and 500U/1ml Heparin  and needle removed intact. No bruising or swelling noted at the site. Procedure tolerated well and without incident.     Discharged from clinic ambulatory in stable condition. Alert and oriented x 3. F/U with [facility] as scheduled.

## 2024-02-29 NOTE — Patient Instructions (Signed)
 [facility] at [facility] Discharge Instructions   You were seen and examined today by Dr. Ellin Saba.  He reviewed the results of your lab work which are normal/stable.   We will see you back in .   Return as scheduled.    Thank you for choosing [facility] at [facility] to provide your oncology and hematology care.  To afford each patient quality time with our provider, please arrive at least 15 minutes before your scheduled appointment time.   If you have a lab appointment with the Cancer Center please come in thru the Main Entrance and check in at the main information desk.  You need to re-schedule your appointment should you arrive 10 or more minutes late.  We strive to give you quality time with our providers, and arriving late affects you and other patients whose appointments are after yours.  Also, if you no show three or more times for appointments you may be dismissed from the clinic at the providers discretion.     Again, thank you for choosing [facility].  Our hope is that these requests will decrease the amount of time that you wait before being seen by our physicians.       _____________________________________________________________  Should you have questions after your visit to [facility], please contact our office at [phone] and follow the prompts.  Our office hours are 8:00 a.m. and 4:30 p.m. Monday - Friday.  Please note that voicemails left after 4:00 p.m. may not be returned until the following business day.  We are closed weekends and major holidays.  You do have access to a nurse 24-7, just call the main number to the clinic [phone] and do not press any options, hold on the line and a nurse will answer the phone.    For prescription refill requests, have your pharmacy contact our office and allow 72 hours.    Due to Covid, you will need to wear a mask upon entering the hospital. If  you do not have a mask, a mask will be given to you at the Main Entrance upon arrival. For doctor visits, patients may have 1 support person age 41 or older with them. For treatment visits, patients can not have anyone with them due to social distancing guidelines and our immunocompromised population.

## 2024-02-29 NOTE — Progress Notes (Signed)
 Jose Joseph [address] [place] [address]   Clinic Day:  02/29/2024  Referring physician: Sheryle Carwin, MD  Patient Care Team: Jose Carwin, MD as PCP - General (Internal Medicine) Jose Joseph, Jose SQUIBB, MD as PCP - Cardiology (Cardiology) Jose Hai, MD as Medical Oncologist (Medical Oncology) Jose Joesph SQUIBB, RN as Oncology Nurse Navigator (Medical Oncology)   ASSESSMENT & PLAN:   Assessment:  1.  Stage IV adenocarcinoma of the lung to the bones: - PET/CT (10/19/2023): Bilateral lung nodules with largest right upper lobe nodule measuring 3.2 x 3 cm, bilateral hilar and mediastinal lymphadenopathy, right supraclavicular lymphadenopathy, hypermetabolic bone lesions involving sternum, left sixth rib and right iliac bone. - Bronchoscopy and biopsy on 11/21/2023 - Pathology (11/21/2023): RUL nodule FNA: Adenocarcinoma, positive for TTF-1 and negative for p40.  FNA of lymph node stations 4R,11R consistent with adenocarcinoma. - [employer] (12/06/2020): BRAF V600E, T p53, and NF2 - Caris PD-L1 (22 C3) TPS 95% - XRT to the rib and lung from 12/18/2023 through 12/29/2023 - Braftovi  (Encorafenib ) 450 mg daily and Mektovi  (binimetinib ) 45 mg twice daily started on 12/28/2023  2. Social/Family History: -Lives at home with wife. Resides in [place] 4-5 months each year. Retired from tobacco farm and water  plant. Quit tobacco use 10 years ago of 1-2 ppd for over 40 years. 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  -Sister died from renal cancer. Brother died from prostate cancer. Another brother died from pancreatic cancer. Maternal uncle died from lung cancer. Paternal aunt died from cancer, type unknown.  Plan:  1.  Stage IV adenocarcinoma of the lung to the bones, BRAF V600E positive: - He is tolerating Braftovi  and Mektovi  very well. - No vision problems.  No new skin lesions. - Labs from today: Normal LFTs and  creatinine.  CBC grossly normal. - Continue Braftovi  and Mektovi  without any dose modifications.  Will repeat CT CAP with contrast to evaluate response and follow-up in 3 weeks.  2.  Atrial fibrillation, s/p TEE/cardioversion on 01/01/2024: - Not a candidate for Cardizem /amiodarone/DOAC's due to Braftovi  and Mektovi . - She is using metoprolol  for rate control. - Last echo on 01/26/2024 with LVEF stable. - He is on Jose Joseph .  Latest INR is 2.2.  3.  Pericardial effusion: - Last echo on 01/26/2024 did not show effusion.  Will follow-up on another echocardiogram in 2 months.  4.  Abnormal brain MRI: - Brain MRI on 12/06/2023 showed small foci of diffusion weighted abnormalities.  Will repeat another MRI.   Orders Placed This Encounter  Procedures   CT CHEST ABDOMEN PELVIS W CONTRAST    Standing Status:   Future    Expected Date:   03/14/2024    Expiration Date:   02/28/2025    If indicated for the ordered procedure, I authorize the administration of contrast media per Radiology protocol:   Yes    Does the patient have a contrast media/X-ray dye allergy?:   No    Preferred imaging location?:   [facility]    Release to patient:   Immediate    If indicated for the ordered procedure, I authorize the administration of oral contrast media per Radiology protocol:   Yes   MR Brain W Wo Contrast    Standing Status:   Future    Expected Date:   03/14/2024    Expiration Date:   02/28/2025    If indicated for the ordered procedure, I authorize the  administration of contrast media per Radiology protocol:   Yes    What is the patient's sedation requirement?:   No Sedation    Does the patient have a pacemaker or implanted devices?:   No    Use SRS Protocol?:   No    Preferred imaging location?:   [facility] (table limit - 500lbs)   ECHOCARDIOGRAM COMPLETE    Standing Status:   Future    Expected Date:   03/31/2024    Expiration Date:   02/28/2025    Where should this test be performed:    [facility]    Perflutren DEFINITY (image enhancing agent) should be administered unless hypersensitivity or allergy exist:   Administer Perflutren    Reason for exam-Echo:   Chemo  Z09     I,Jose Joseph,acting as a scribe for Jose Stands, MD.,have documented all relevant documentation on the behalf of Jose Stands, MD,as directed by  Jose Stands, MD while in the presence of Jose Stands, MD.  I, Jose Stands MD, have reviewed the above documentation for accuracy and completeness, and I agree with the above.     Jose Stands, MD   7/17/20251:42 PM  CHIEF COMPLAINT/PURPOSE OF CONSULT:   Diagnosis: Metastatic adenocarcinoma of the lung to the bones   Cancer Staging  Malignant neoplasm of upper lobe of right lung [facility]) Staging form: Lung, AJCC V9 - Clinical stage from 11/30/2023: cT4, cN3(f), cM1 - Unsigned    Prior Therapy: None  Current Therapy: Braftovi  and Mektovi    HISTORY OF PRESENT ILLNESS:   Oncology History   No history exists.      Jose Joseph is a 68 y.o. male presenting to clinic today for evaluation of RUL lung adenocarcinoma at the request of Jose Domino, NP.  Patient was originally seen by pulmonology on 10/16/23 after imaging from December 2024 done in [place] showed a spiculated lung lesion of 4.3 x 3.5 cm in size with satellite lesions and smaller bilateral lung nodules. He then had an initial PET on 10/19/23 which showed: Findings are most consistent with metastatic lung cancer. There are multiple hypermetabolic pulmonary nodules bilaterally, largest a spiculated nodule inferiorly in the right upper lobe which likely represents the primary malignancy. Hypermetabolic mediastinal and hilar lymph nodes bilaterally, consistent with metastatic disease. Hypermetabolic osseous lesions involving the sternum, left 6th rib and right iliac bone, consistent with metastatic disease. No evidence of metastatic disease in the abdomen or  pelvis.   Jose Joseph then had bronchoscopy with biopsies of the lung nodules under Jose Joseph on 11/21/23. Cytology of the RUL lung nodules revealed: adenocarcinoma with tumor cells positive for TTF-1 and negative for p40. Lymph nodes 4R and 11R biopsies showed: adenocarcinoma and lymphoid tissue identified. Lymph node 10R found: no malignant cells, but lymphocytes identified.   Today, he states that he is doing well overall. His appetite level is at 100%. His energy level is at 50%. He is accompanied by his wife and   Arbie notes he was first aware of lung cancer in January 2025 after a lung infection that required antibiotics and imaging to be done.  He has unintentionally lost 4 pounds in the last 1.5 months. He notes a normal appetite. Wyatt denies any prior history of MI's, CVA's, or TIA's.   He reports chest pain attributable to coughing and is treating pain with OTC ibuprofen. His coughing has worsened recently with an onset before bronchoscopy. He also notes occasional wheezing, worsened when lying down. Wheezing and coughing  improve when lying on his left side. Silas also reports occasional band-like lower abdominal pain present on exertion and when lifting weights. He denies any headaches, vision changes, or ankle swellings.   Surgical history includes hip replacements, hernia repair, left knee arthroplasty, neck surgery that improved neuropathy in right shoulder, and right wrist surgery. He denies any peripheral neuropathy. Hussein stays in [place] for 4-5 months of the year, as his wife has a home there.  INTERVAL HISTORY:   Jose Joseph is a 68 y.o. male presenting to the clinic today for follow-up of metastatic adenocarcinoma of right lung. He was last seen by me on 02/05/2024.  Today, he states that he is doing well overall. His appetite level is at 100%. His energy level is at 75%. Kingstin is accompanied by his wife. He has stopped Jose Joseph  injections on 02/26/2024, as he transitioned to Jose Joseph   daily.   Adrion notes his blood pressure has been slightly lowered since starting Braftovi , though he denies any heart issues currently. He reports loose skin and small sores on the soles of the feet. He denies any changes in vision. Marquies states his breathing is normal.   PAST MEDICAL HISTORY:   Past Medical History: Past Medical History:  Diagnosis Date   Arthritis    Asthma    Atrial fibrillation (HCC)    Cancer (HCC)    Complication of anesthesia    after his neck surgery pt. trying to get up and walk out   COPD (chronic obstructive pulmonary disease) (HCC)    Depression    History of kidney stones    2021   Hypertension    Metastatic lung carcinoma, right (HCC) 08/2023   [facility])     Surgical History: Past Surgical History:  Procedure Laterality Date   BRONCHIAL BIOPSY  11/21/2023   Procedure: BRONCHOSCOPY, WITH BIOPSY;  Surgeon: Joseph Jose RAMAN, MD;  Location: [facility] ENDOSCOPY;  Service: Pulmonary;;   BRONCHIAL NEEDLE ASPIRATION BIOPSY  11/21/2023   Procedure: BRONCHOSCOPY, WITH NEEDLE ASPIRATION BIOPSY;  Surgeon: Joseph Jose RAMAN, MD;  Location: [facility] ENDOSCOPY;  Service: Pulmonary;;   CARDIOVERSION N/A 01/01/2024   Procedure: CARDIOVERSION;  Surgeon: Francyne Headland, MD;  Location: [facility] INVASIVE CV LAB;  Service: Cardiovascular;  Laterality: N/A;   COLONOSCOPY WITH PROPOFOL  N/A 01/19/2023   Procedure: COLONOSCOPY WITH PROPOFOL ;  Surgeon: Shaaron Jose HERO, MD;  Location: [facility] ENDO SUITE;  Service: Endoscopy;  Laterality: N/A;  200pm, asa 2   ENDOBRONCHIAL ULTRASOUND Bilateral 11/21/2023   Procedure: ENDOBRONCHIAL ULTRASOUND (EBUS);  Surgeon: Joseph Jose RAMAN, MD;  Location: [facility] ENDOSCOPY;  Service: Pulmonary;  Laterality: Bilateral;   HERNIA REPAIR     IR IMAGING GUIDED PORT INSERTION  12/14/2023   IR THORACENTESIS ASP PLEURAL SPACE W/IMG GUIDE  12/28/2023   KNEE ARTHROCENTESIS     NECK SURGERY     POLYPECTOMY  01/19/2023   Procedure: POLYPECTOMY;  Surgeon: Shaaron Jose HERO, MD;   Location: [facility] ENDO SUITE;  Service: Endoscopy;;   TOTAL HIP ARTHROPLASTY Left 01/15/2021   Procedure: LEFT TOTAL HIP ARTHROPLASTY ANTERIOR APPROACH;  Surgeon: Barbarann Oneil BROCKS, MD;  Location: [facility] OR;  Service: Orthopedics;  Laterality: Left;   TRANSESOPHAGEAL ECHOCARDIOGRAM (CATH LAB) N/A 01/01/2024   Procedure: TRANSESOPHAGEAL ECHOCARDIOGRAM;  Surgeon: Francyne Headland, MD;  Location: [facility] INVASIVE CV LAB;  Service: Cardiovascular;  Laterality: N/A;   VIDEO BRONCHOSCOPY WITH ENDOBRONCHIAL NAVIGATION Bilateral 11/21/2023   Procedure: VIDEO BRONCHOSCOPY WITH ENDOBRONCHIAL NAVIGATION;  Surgeon: Joseph Jose RAMAN, MD;  Location: [facility] ENDOSCOPY;  Service: Pulmonary;  Laterality: Bilateral;   WRIST ARTHROPLASTY      Social History: Social History   Socioeconomic History   Marital status: Married    Spouse name: Not on file   Number of children: 2   Years of education: Not on file   Highest education level: Not on file  Occupational History   Occupation: retired  Tobacco Use   Smoking status: Former    Current packs/day: 0.00    Average packs/day: 1 pack/day for 35.0 years (35.0 ttl pk-yrs)    Types: Cigarettes    Start date: 52    Quit date: 2014    Years since quitting: 11.5    Passive exposure: Past   Smokeless tobacco: Never  Vaping Use   Vaping status: Never Used  Substance and Sexual Activity   Alcohol use: Not Currently    Alcohol/week: 2.0 standard drinks of alcohol    Types: 2 Cans of beer per week   Drug use: Yes    Types: Marijuana    Comment: last: 1 week ago   Sexual activity: Not on file  Other Topics Concern   Not on file  Social History Narrative   Not on file   Social Drivers of Health   Financial Resource Strain: Not on file  Food Insecurity: No Food Insecurity (12/27/2023)   Hunger Vital Sign    Worried About Running Out of Food in the Last Year: Never true    Ran Out of Food in the Last Year: Never true  Transportation Needs: No Transportation Needs (12/27/2023)    PRAPARE - Administrator, Civil Service (Medical): No    Lack of Transportation (Non-Medical): No  Physical Activity: Not on file  Stress: Not on file  Social Connections: Patient Declined (12/27/2023)   Social Connection and Isolation Panel    Frequency of Communication with Friends and Family: Patient declined    Frequency of Social Gatherings with Friends and Family: Patient declined    Attends Religious Services: Patient declined    Database administrator or Organizations: Patient declined    Attends Banker Meetings: Patient declined    Marital Status: Patient declined  Intimate Partner Violence: Not At Risk (12/27/2023)   Humiliation, Afraid, Rape, and Kick questionnaire    Fear of Current or Ex-Partner: No    Emotionally Abused: No    Physically Abused: No    Sexually Abused: No    Family History: Family History  Problem Relation Age of Onset   Kidney cancer Sister    Prostate cancer Brother     Current Medications:  Current Outpatient Medications:    albuterol  (VENTOLIN  HFA) 108 (90 Base) MCG/ACT inhaler, Inhale 1 puff into the lungs every 4 (four) hours as needed for wheezing., Disp: 18 g, Rfl: 1   binimetinib  (MEKTOVI ) 15 MG tablet, Take 3 tablets (45 mg total) by mouth 2 (two) times daily., Disp: 180 tablet, Rfl: 0   budesonide -glycopyrrolate -formoterol  (BREZTRI  AEROSPHERE) 160-9-4.8 MCG/ACT AERO inhaler, Inhale 2 puffs into the lungs in the morning and at bedtime., Disp: 10.7 g, Rfl: 1   cetirizine (ZYRTEC) 10 MG tablet, Take 10 mg by mouth in the morning., Disp: , Rfl:    citalopram  (CELEXA ) 20 MG tablet, Take 20 mg by mouth in the morning., Disp: , Rfl:    encorafenib  (BRAFTOVI ) 75 MG capsule, Take 6 capsules (450 mg total) by mouth daily., Disp: 180 capsule, Rfl: 0   EPINEPHrine  0.3 mg/0.3 mL IJ SOAJ  injection, Inject 0.3 mg into the muscle as needed for anaphylaxis., Disp: , Rfl:    guaiFENesin  (MUCINEX ) 600 MG 12 hr tablet, Take 2  tablets (1,200 mg total) by mouth 2 (two) times daily., Disp: 60 tablet, Rfl: 0   metoprolol  tartrate (LOPRESSOR ) 50 MG tablet, Take 1 tablet (50 mg total) by mouth 2 (two) times daily., Disp: 60 tablet, Rfl: 3   prochlorperazine  (COMPAZINE ) 10 MG tablet, TAKE 1 TABLET(10 MG) BY MOUTH EVERY 6 HOURS AS NEEDED, Disp: 60 tablet, Rfl: 2   warfarin (Jose Joseph ) 10 MG tablet, Take 1 tablet (10 mg total) by mouth daily. Or as directed by Jose Joseph  clinic, Disp: 30 tablet, Rfl: 3 No current facility-administered medications for this visit.  Facility-Administered Medications Ordered in Other Visits:    heparin  lock flush 100 unit/mL, 500 Units, Intravenous, Once, Jose Bielinski, MD   Allergies: Allergies  Allergen Reactions   Bee Venom Anaphylaxis    syncope    REVIEW OF SYSTEMS:   Review of Systems  Constitutional:  Negative for chills, fatigue and fever.  HENT:   Negative for lump/mass, mouth sores, nosebleeds, sore throat and trouble swallowing.   Eyes:  Negative for eye problems.  Respiratory:  Positive for cough. Negative for shortness of breath.   Cardiovascular:  Negative for chest pain, leg swelling and palpitations.  Gastrointestinal:  Negative for abdominal pain, constipation, diarrhea, nausea and vomiting.  Genitourinary:  Negative for bladder incontinence, difficulty urinating, dysuria, frequency, hematuria and nocturia.   Musculoskeletal:  Negative for arthralgias, back pain, flank pain, myalgias and neck pain.  Skin:  Negative for itching and rash.  Neurological:  Negative for dizziness, headaches and numbness.  Hematological:  Does not bruise/bleed easily.  Psychiatric/Behavioral:  Negative for depression, sleep disturbance and suicidal ideas. The patient is not nervous/anxious.   All other systems reviewed and are negative.    VITALS:   There were no vitals taken for this visit.  Wt Readings from Last 3 Encounters:  02/29/24 274 lb 11.1 oz (124.6 kg)  02/27/24 275 lb  9.6 oz (125 kg)  02/05/24 272 lb 14.9 oz (123.8 kg)    There is no height or weight on file to calculate BMI.  Performance status (ECOG): 1 - Symptomatic but completely ambulatory  PHYSICAL EXAM:   Physical Exam Vitals and nursing note reviewed. Exam conducted with a chaperone present.  Constitutional:      Appearance: Normal appearance.  Cardiovascular:     Rate and Rhythm: Normal rate and regular rhythm.     Pulses: Normal pulses.     Heart sounds: Normal heart sounds.  Pulmonary:     Effort: Pulmonary effort is normal.     Breath sounds: Normal breath sounds.  Abdominal:     Palpations: Abdomen is soft. There is no hepatomegaly, splenomegaly or mass.     Tenderness: There is no abdominal tenderness.  Musculoskeletal:     Right lower leg: No edema.     Left lower leg: No edema.  Lymphadenopathy:     Cervical: No cervical adenopathy.     Right cervical: No superficial, deep or posterior cervical adenopathy.    Left cervical: No superficial, deep or posterior cervical adenopathy.     Upper Body:     Right upper body: No supraclavicular or axillary adenopathy.     Left upper body: No supraclavicular or axillary adenopathy.  Neurological:     General: No focal deficit present.     Mental Status: He is alert and oriented  to person, place, and time.  Psychiatric:        Mood and Affect: Mood normal.        Behavior: Behavior normal.     LABS:   CBC    Component Value Date/Time   WBC 7.0 02/29/2024 0956   RBC 4.46 02/29/2024 0956   HGB 13.9 02/29/2024 0956   HCT 41.2 02/29/2024 0956   PLT 172 02/29/2024 0956   MCV 92.4 02/29/2024 0956   MCH 31.2 02/29/2024 0956   MCHC 33.7 02/29/2024 0956   RDW 14.4 02/29/2024 0956   LYMPHSABS 0.8 02/29/2024 0956   MONOABS 0.6 02/29/2024 0956   EOSABS 0.2 02/29/2024 0956   BASOSABS 0.0 02/29/2024 0956    CMP    Component Value Date/Time   NA 137 02/29/2024 0956   K 3.9 02/29/2024 0956   CL 104 02/29/2024 0956   CO2 25  02/29/2024 0956   GLUCOSE 107 (H) 02/29/2024 0956   BUN 23 02/29/2024 0956   CREATININE 1.03 02/29/2024 0956   CALCIUM 9.0 02/29/2024 0956   PROT 6.9 02/29/2024 0956   ALBUMIN 3.3 (L) 02/29/2024 0956   AST 13 (L) 02/29/2024 0956   ALT 11 02/29/2024 0956   ALKPHOS 71 02/29/2024 0956   BILITOT 0.4 02/29/2024 0956   GFRNONAA >60 02/29/2024 0956   GFRAA  01/08/2009 1449    >60        The eGFR has been calculated using the MDRD equation. This calculation has not been validated in all clinical situations. eGFR's persistently <60 mL/min signify possible Chronic Kidney Disease.     No results found for: CEA1, CEA / No results found for: CEA1, CEA No results found for: PSA1 No results found for: CAN199 No results found for: CAN125  No results found for: TOTALPROTELP, ALBUMINELP, A1GS, A2GS, BETS, BETA2SER, GAMS, MSPIKE, SPEI No results found for: TIBC, FERRITIN, IRONPCTSAT Lab Results  Component Value Date   LDH 201 (H) 12/29/2023     STUDIES:   No results found.

## 2024-02-29 NOTE — Progress Notes (Signed)
 Patient is taking Mektovi  and Braftovi  as prescribed. He has not missed any doses and reports no side effects at this time.

## 2024-03-11 ENCOUNTER — Ambulatory Visit (INDEPENDENT_AMBULATORY_CARE_PROVIDER_SITE_OTHER): Admitting: *Deleted

## 2024-03-11 ENCOUNTER — Ambulatory Visit (HOSPITAL_COMMUNITY)
Admission: RE | Admit: 2024-03-11 | Discharge: 2024-03-11 | Disposition: A | Discharge status: Home / Self Care | Source: Ambulatory Visit | Attending: Hematology | Admitting: Hematology

## 2024-03-11 DIAGNOSIS — C3491 Malignant neoplasm of unspecified part of right bronchus or lung: Secondary | ICD-10-CM | POA: Insufficient documentation

## 2024-03-11 DIAGNOSIS — Z5181 Encounter for therapeutic drug level monitoring: Secondary | ICD-10-CM | POA: Insufficient documentation

## 2024-03-11 DIAGNOSIS — I4891 Unspecified atrial fibrillation: Secondary | ICD-10-CM

## 2024-03-11 LAB — POCT INR: INR: 3.2 — AB (ref 2.0–3.0)

## 2024-03-11 MED ORDER — IOHEXOL 300 MG/ML  SOLN
100.0000 mL | Freq: Once | INTRAMUSCULAR | Status: AC | PRN
  Administered 2024-03-11: 100 mL via INTRAVENOUS

## 2024-03-11 MED ORDER — GADOBUTROL 1 MMOL/ML IV SOLN: 10.0000 mL | Freq: Once | INTRAVENOUS | Status: DC | PRN

## 2024-03-11 MED ORDER — GADOBUTROL 1 MMOL/ML IV SOLN
10.0000 mL | Freq: Once | INTRAVENOUS | Status: AC | PRN
  Administered 2024-03-11: 10 mL via INTRAVENOUS

## 2024-03-11 NOTE — Patient Instructions (Signed)
 Decrease warfarin to 1 tablet daily except 1/2 tablet on Mondays. Recheck in 2 wk.

## 2024-03-11 NOTE — Progress Notes (Signed)
 INR 3.2. Please see anticoagulation encounter

## 2024-03-12 ENCOUNTER — Other Ambulatory Visit (HOSPITAL_COMMUNITY): Payer: Self-pay

## 2024-03-15 ENCOUNTER — Other Ambulatory Visit: Payer: Self-pay

## 2024-03-15 ENCOUNTER — Encounter (INDEPENDENT_AMBULATORY_CARE_PROVIDER_SITE_OTHER): Payer: Self-pay

## 2024-03-15 ENCOUNTER — Other Ambulatory Visit: Payer: Self-pay | Admitting: Hematology

## 2024-03-15 DIAGNOSIS — C3490 Malignant neoplasm of unspecified part of unspecified bronchus or lung: Secondary | ICD-10-CM

## 2024-03-15 NOTE — Progress Notes (Signed)
 Specialty Pharmacy Refill Coordination Note  Jose Joseph is a 68 y.o. male contacted today regarding refills of specialty medication(s) Binimetinib  (Mektovi ); Encorafenib  (Braftovi )   Patient requested [name] at [facility] [place] date: 03/25/24   Medication will be filled on 03/22/24.

## 2024-03-18 ENCOUNTER — Other Ambulatory Visit (HOSPITAL_COMMUNITY): Payer: Self-pay

## 2024-03-18 ENCOUNTER — Other Ambulatory Visit: Payer: Self-pay

## 2024-03-18 MED ORDER — MEKTOVI 15 MG PO TABS
45.0000 mg | ORAL_TABLET | Freq: Two times a day (BID) | ORAL | 0 refills | Status: DC
  Filled 2024-03-18: qty 180, 30d supply, fill #0

## 2024-03-18 MED ORDER — BRAFTOVI 75 MG PO CAPS
450.0000 mg | ORAL_CAPSULE | Freq: Every day | ORAL | 0 refills | Status: DC
  Filled 2024-03-18: qty 180, 30d supply, fill #0

## 2024-03-20 ENCOUNTER — Ambulatory Visit: Admitting: Primary Care

## 2024-03-21 ENCOUNTER — Other Ambulatory Visit: Payer: Self-pay

## 2024-03-21 ENCOUNTER — Inpatient Hospital Stay

## 2024-03-21 ENCOUNTER — Inpatient Hospital Stay: Attending: Hematology | Admitting: Hematology

## 2024-03-21 VITALS — BP 102/61 | HR 80 | Temp 97.5°F | Resp 20 | Wt 273.6 lb

## 2024-03-21 DIAGNOSIS — Z87891 Personal history of nicotine dependence: Secondary | ICD-10-CM | POA: Diagnosis not present

## 2024-03-21 DIAGNOSIS — Z7189 Other specified counseling: Secondary | ICD-10-CM | POA: Diagnosis not present

## 2024-03-21 DIAGNOSIS — C3491 Malignant neoplasm of unspecified part of right bronchus or lung: Secondary | ICD-10-CM

## 2024-03-21 DIAGNOSIS — C3411 Malignant neoplasm of upper lobe, right bronchus or lung: Secondary | ICD-10-CM | POA: Insufficient documentation

## 2024-03-21 DIAGNOSIS — Z7901 Long term (current) use of anticoagulants: Secondary | ICD-10-CM | POA: Insufficient documentation

## 2024-03-21 DIAGNOSIS — C7951 Secondary malignant neoplasm of bone: Secondary | ICD-10-CM | POA: Insufficient documentation

## 2024-03-21 LAB — COMPREHENSIVE METABOLIC PANEL WITH GFR
ALT: 10 U/L (ref 0–44)
AST: 12 U/L — ABNORMAL LOW (ref 15–41)
Albumin: 3.2 g/dL — ABNORMAL LOW (ref 3.5–5.0)
Alkaline Phosphatase: 80 U/L (ref 38–126)
Anion gap: 11 (ref 5–15)
BUN: 24 mg/dL — ABNORMAL HIGH (ref 8–23)
CO2: 24 mmol/L (ref 22–32)
Calcium: 9.1 mg/dL (ref 8.9–10.3)
Chloride: 101 mmol/L (ref 98–111)
Creatinine, Ser: 1.11 mg/dL (ref 0.61–1.24)
GFR, Estimated: >60 mL/min (ref >60)
Glucose, Bld: 105 mg/dL — ABNORMAL HIGH (ref 70–99)
Potassium: 3.9 mmol/L (ref 3.5–5.1)
Sodium: 136 mmol/L (ref 135–145)
Total Bilirubin: 0.7 mg/dL (ref 0.0–1.2)
Total Protein: 7.3 g/dL (ref 6.5–8.1)

## 2024-03-21 LAB — CBC WITH DIFFERENTIAL/PLATELET
Abs Immature Granulocytes: 0.02 K/uL (ref 0.00–0.07)
Basophils Absolute: 0 K/uL (ref 0.0–0.1)
Basophils Relative: 0 %
Eosinophils Absolute: 0.4 K/uL (ref 0.0–0.5)
Eosinophils Relative: 5 %
HCT: 39.9 % (ref 39.0–52.0)
Hemoglobin: 13.4 g/dL (ref 13.0–17.0)
Immature Granulocytes: 0 %
Lymphocytes Relative: 12 %
Lymphs Abs: 1 K/uL (ref 0.7–4.0)
MCH: 30.9 pg (ref 26.0–34.0)
MCHC: 33.6 g/dL (ref 30.0–36.0)
MCV: 92.1 fL (ref 80.0–100.0)
Monocytes Absolute: 0.9 K/uL (ref 0.1–1.0)
Monocytes Relative: 11 %
Neutro Abs: 5.8 K/uL (ref 1.7–7.7)
Neutrophils Relative %: 72 %
Platelets: 243 K/uL (ref 150–400)
RBC: 4.33 MIL/uL (ref 4.22–5.81)
RDW: 13.7 % (ref 11.5–15.5)
WBC: 8.1 K/uL (ref 4.0–10.5)
nRBC: 0 % (ref 0.0–0.2)

## 2024-03-21 LAB — MAGNESIUM: Magnesium: 2.2 mg/dL (ref 1.7–2.4)

## 2024-03-21 NOTE — Patient Instructions (Addendum)
 [facility]  Discharge Instructions  You were seen and examined today by Dr. Rogers.  Dr. Rogers discussed your most recent lab work and CT scan which revealed that everything looks good. Your MRI shows that the spot on your lung has decreased. CT scan shows that everything looks stable except for a new bone lesion spot on your sacrum. All the labs look good.  Continue taking the Mektovi  as prescribed. Dr. Rogers will repeat CT scan and labs before your next appointment.  Follow-up as scheduled.    Thank you for choosing [facility] to provide your oncology and hematology care.   To afford each patient quality time with our provider, please arrive at least 15 minutes before your scheduled appointment time. You may need to reschedule your appointment if you arrive late (10 or more minutes). Arriving late affects you and other patients whose appointments are after yours.  Also, if you miss three or more appointments without notifying the office, you may be dismissed from the clinic at the provider's discretion.    Again, thank you for choosing [facility].  Our hope is that these requests will decrease the amount of time that you wait before being seen by our physicians.   If you have a lab appointment with the Cancer Center - please note that after April 8th, all labs will be drawn in the cancer center.  You do not have to check in or register with the main entrance as you have in the past but will complete your check-in at the cancer center.            _____________________________________________________________  Should you have questions after your visit to [facility], please contact our office at [phone] and follow the prompts.  Our office hours are 8:00 a.m. to 4:30 p.m. Monday - Thursday and 8:00 a.m. to 2:30 p.m. Friday.  Please note that voicemails left after 4:00 p.m. may not be  returned until the following business day.  We are closed weekends and all major holidays.  You do have access to a nurse 24-7, just call the main number to the clinic [phone] and do not press any options, hold on the line and a nurse will answer the phone.    For prescription refill requests, have your pharmacy contact our office and allow 72 hours.    Masks are no longer required in the cancer centers. If you would like for your care team to wear a mask while they are taking care of you, please let them know. You may have one support person who is at least 70 years old accompany you for your appointments.

## 2024-03-21 NOTE — Progress Notes (Signed)
 [facility] [address], [place] [address]   Clinic Day:  03/21/2024  Referring physician: Sheryle Carwin, MD  Patient Care Team: Sheryle Carwin, MD as PCP - General (Internal Medicine) Mallipeddi, Diannah SQUIBB, MD as PCP - Cardiology (Cardiology) Rogers Hai, MD as Medical Oncologist (Medical Oncology) Celestia Joesph SQUIBB, RN as Oncology Nurse Navigator (Medical Oncology)   ASSESSMENT & PLAN:   Assessment:  1.  Stage IV adenocarcinoma of the lung to the bones: - PET/CT (10/19/2023): Bilateral lung nodules with largest right upper lobe nodule measuring 3.2 x 3 cm, bilateral hilar and mediastinal lymphadenopathy, right supraclavicular lymphadenopathy, hypermetabolic bone lesions involving sternum, left sixth rib and right iliac bone. - Bronchoscopy and biopsy on 11/21/2023 - Pathology (11/21/2023): RUL nodule FNA: Adenocarcinoma, positive for TTF-1 and negative for p40.  FNA of lymph node stations 4R,11R consistent with adenocarcinoma. - [employer] (12/06/2020): BRAF V600E, T p53, and NF2 - Caris PD-L1 (22 C3) TPS 95% - XRT to the rib and lung from 12/18/2023 through 12/29/2023 - Braftovi  (Encorafenib ) 450 mg daily and Mektovi  (binimetinib ) 45 mg twice daily started on 12/28/2023  2. Social/Family History: -Lives at home with wife. Resides in [place] 4-5 months each year. Retired from tobacco farm and Engineering geologist. Quit tobacco use 10 years ago of 1-2 ppd for over 40 years. Possible asbestos exposure when working at [employer]. 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  -Sister died from renal cancer. Brother died from prostate cancer. Another brother died from pancreatic cancer. Maternal uncle died from lung cancer. Paternal aunt died from cancer, type unknown.  Plan:  1.  Stage IV adenocarcinoma of the lung to the bones, BRAF V600E positive: - Jose Joseph is tolerating Braftovi  and Mektovi  very well. - No vision problems or skin lesions.  No GI symptoms. - CT CAP on 03/11/2024:  Regression of dominant pulmonary nodules.  New sclerotic lesion in the central sacrum which we will keep an eye on. - Recommend continuing Braftovi  and Mektovi .  RTC 12 weeks for follow-up with repeat CT CAP with contrast and labs.  2.  Atrial fibrillation, s/p TEE/cardioversion on 01/01/2024: - Not a candidate for Cardizem /amiodarone/DOAC's due to Braftovi  and Mektovi . - Jose Joseph is on metoprolol  and has adequate rate control. - Last LVEF on 01/26/2024 stable.  Jose Joseph is currently on Coumadin .  Continue follow-up with Coumadin  clinic.  3.  Pericardial effusion: - Echocardiogram on 01/26/2024: Did not show effusion.  We will monitor another echo periodically.  4.  Abnormal brain MRI: - Brain MRI (03/11/2024): No evidence of intracranial metastatic disease.  Will repeat brain imaging if Jose Joseph is symptomatic.   Orders Placed This Encounter  Procedures   CT CHEST ABDOMEN PELVIS W CONTRAST    Standing Status:   Future    Expected Date:   06/21/2024    Expiration Date:   09/19/2024    If indicated for the ordered procedure, I authorize the administration of contrast media per Radiology protocol:   Yes    Does the patient have a contrast media/X-ray dye allergy?:   No    Preferred imaging location?:   [facility]    Release to patient:   Immediate    If indicated for the ordered procedure, I authorize the administration of oral contrast media per Radiology protocol:   Yes   CBC with Differential/Platelet    Standing Status:   Future    Expected Date:   06/21/2024    Expiration Date:   09/19/2024  Release to patient:   Immediate   Comprehensive metabolic panel with GFR    Standing Status:   Future    Expected Date:   06/21/2024    Expiration Date:   09/19/2024    Release to patient:   Immediate   Magnesium    Standing Status:   Future    Expected Date:   06/21/2024    Expiration Date:   09/19/2024    Release to patient:   Immediate   Ambulatory referral to Social Work    Referral Priority:   Routine     Referral Type:   Consultation    Referral Reason:   Specialty Services Required    Number of Visits Requested:   1     I,Helena R Teague,acting as a Neurosurgeon for Alean Stands, MD.,have documented all relevant documentation on the behalf of Alean Stands, MD,as directed by  Alean Stands, MD while in the presence of Alean Stands, MD.  I, Alean Stands MD, have reviewed the above documentation for accuracy and completeness, and I agree with the above.      Alean Stands, MD   8/7/20254:12 PM  CHIEF COMPLAINT/PURPOSE OF CONSULT:   Diagnosis: Metastatic adenocarcinoma of the lung to the bones   Cancer Staging  Malignant neoplasm of upper lobe of right lung [facility]) Staging form: Lung, AJCC V9 - Clinical stage from 11/30/2023: cT4, cN3(f), cM1 - Unsigned    Prior Therapy: None  Current Therapy: Braftovi  and Mektovi    HISTORY OF PRESENT ILLNESS:   Oncology History   No history exists.      Jose Joseph is a 68 y.o. male presenting to clinic today for evaluation of RUL lung adenocarcinoma at the request of Ruthell Domino, NP.  Patient was originally seen by pulmonology on 10/16/23 after imaging from December 2024 done in [place] showed a spiculated lung lesion of 4.3 x 3.5 cm in size with satellite lesions and smaller bilateral lung nodules. Jose Joseph then had an initial PET on 10/19/23 which showed: Findings are most consistent with metastatic lung cancer. There are multiple hypermetabolic pulmonary nodules bilaterally, largest a spiculated nodule inferiorly in the right upper lobe which likely represents the primary malignancy. Hypermetabolic mediastinal and hilar lymph nodes bilaterally, consistent with metastatic disease. Hypermetabolic osseous lesions involving the sternum, left 6th rib and right iliac bone, consistent with metastatic disease. No evidence of metastatic disease in the abdomen or pelvis.   Jose Joseph then had bronchoscopy with biopsies of the lung  nodules under Dr. Shelah on 11/21/23. Cytology of the RUL lung nodules revealed: adenocarcinoma with tumor cells positive for TTF-1 and negative for p40. Lymph nodes 4R and 11R biopsies showed: adenocarcinoma and lymphoid tissue identified. Lymph node 10R found: no malignant cells, but lymphocytes identified.   Today, Jose Joseph states that Jose Joseph is doing well overall. His appetite level is at 100%. His energy level is at 50%. Jose Joseph is accompanied by his wife and   Jose Joseph notes Jose Joseph was first aware of lung cancer in January 2025 after a lung infection that required antibiotics and imaging to be done.  Jose Joseph has unintentionally lost 4 pounds in the last 1.5 months. Jose Joseph notes a normal appetite. Jose Joseph denies any prior history of MI's, CVA's, or TIA's.   Jose Joseph reports chest pain attributable to coughing and is treating pain with OTC ibuprofen. His coughing has worsened recently with an onset before bronchoscopy. Jose Joseph also notes occasional wheezing, worsened when lying down. Wheezing and coughing improve when lying on his left side. Jose Joseph  also reports occasional band-like lower abdominal pain present on exertion and when lifting weights. Jose Joseph denies any headaches, vision changes, or ankle swellings.   Surgical history includes hip replacements, hernia repair, left knee arthroplasty, neck surgery that improved neuropathy in right shoulder, and right wrist surgery. Jose Joseph denies any peripheral neuropathy. Reinhart stays in [place] for 4-5 months of the year, as his wife has a home there.  INTERVAL HISTORY:   Jose Joseph is a 68 y.o. male presenting to the clinic today for follow-up of metastatic adenocarcinoma of right lung. Jose Joseph was last seen by me on 02/29/2024.  Since his last visit, Jose Joseph underwent CT CAP on 03/11/2024 that found: Regression of dominant pulmonary nodules, with a 7 mm subpleural nodule in the superior segment of the right lower lobe, previously 16 mm. Multiple subcentimeter pleural-based and subpleural nodules remain. Multiple  sclerotic bone metastases including a new lesion centrally in the sacrum.  MRI brain done on 03/11/2024 showed: No evidence of intracranial metastatic disease. Chronic small vessel ischemic disease with small chronic infarcts, as described. 10 mm left maxillary sinus mucous retention cyst.  Today, Jose Joseph states that Jose Joseph is doing well overall. His appetite level is at 100%. His energy level is at 75%. Jose Joseph notes a normal appetite. Jose Joseph is tolerating Braftovi  and Mektovi  well and denies any fevers. Jose Joseph reports intermittent tiredness due Braftovi  and Mektovi .  His INR was 3.2 at his last visit to cardiology and Jose Joseph states Jose Joseph is working out his coumadin  dosage.  PAST MEDICAL HISTORY:   Past Medical History: Past Medical History:  Diagnosis Date   Arthritis    Asthma    Atrial fibrillation (HCC)    Cancer (HCC)    Complication of anesthesia    after his neck surgery pt. trying to get up and walk out   COPD (chronic obstructive pulmonary disease) (HCC)    Depression    History of kidney stones    2021   Hypertension    Metastatic lung carcinoma, right (HCC) 08/2023   Stroke [facility])     Surgical History: Past Surgical History:  Procedure Laterality Date   BRONCHIAL BIOPSY  11/21/2023   Procedure: BRONCHOSCOPY, WITH BIOPSY;  Surgeon: Shelah Lamar RAMAN, MD;  Location: [facility] ENDOSCOPY;  Service: Pulmonary;;   BRONCHIAL NEEDLE ASPIRATION BIOPSY  11/21/2023   Procedure: BRONCHOSCOPY, WITH NEEDLE ASPIRATION BIOPSY;  Surgeon: Shelah Lamar RAMAN, MD;  Location: [facility] ENDOSCOPY;  Service: Pulmonary;;   CARDIOVERSION N/A 01/01/2024   Procedure: CARDIOVERSION;  Surgeon: Francyne Headland, MD;  Location: [facility] INVASIVE CV LAB;  Service: Cardiovascular;  Laterality: N/A;   COLONOSCOPY WITH PROPOFOL  N/A 01/19/2023   Procedure: COLONOSCOPY WITH PROPOFOL ;  Surgeon: Shaaron Lamar HERO, MD;  Location: [facility] ENDO SUITE;  Service: Endoscopy;  Laterality: N/A;  200pm, asa 2   ENDOBRONCHIAL ULTRASOUND Bilateral 11/21/2023   Procedure:  ENDOBRONCHIAL ULTRASOUND (EBUS);  Surgeon: Shelah Lamar RAMAN, MD;  Location: [facility] ENDOSCOPY;  Service: Pulmonary;  Laterality: Bilateral;   HERNIA REPAIR     IR IMAGING GUIDED PORT INSERTION  12/14/2023   IR THORACENTESIS ASP PLEURAL SPACE W/IMG GUIDE  12/28/2023   KNEE ARTHROCENTESIS     NECK SURGERY     POLYPECTOMY  01/19/2023   Procedure: POLYPECTOMY;  Surgeon: Shaaron Lamar HERO, MD;  Location: [facility] ENDO SUITE;  Service: Endoscopy;;   TOTAL HIP ARTHROPLASTY Left 01/15/2021   Procedure: LEFT TOTAL HIP ARTHROPLASTY ANTERIOR APPROACH;  Surgeon: Barbarann Oneil BROCKS, MD;  Location: [facility] OR;  Service: Orthopedics;  Laterality: Left;  TRANSESOPHAGEAL ECHOCARDIOGRAM (CATH LAB) N/A 01/01/2024   Procedure: TRANSESOPHAGEAL ECHOCARDIOGRAM;  Surgeon: Francyne Headland, MD;  Location: [facility] INVASIVE CV LAB;  Service: Cardiovascular;  Laterality: N/A;   VIDEO BRONCHOSCOPY WITH ENDOBRONCHIAL NAVIGATION Bilateral 11/21/2023   Procedure: VIDEO BRONCHOSCOPY WITH ENDOBRONCHIAL NAVIGATION;  Surgeon: Shelah Lamar RAMAN, MD;  Location: [facility] ENDOSCOPY;  Service: Pulmonary;  Laterality: Bilateral;   WRIST ARTHROPLASTY      Social History: Social History   Socioeconomic History   Marital status: Married    Spouse name: Not on file   Number of children: 2   Years of education: Not on file   Highest education level: Not on file  Occupational History   Occupation: retired  Tobacco Use   Smoking status: Former    Current packs/day: 0.00    Average packs/day: 1 pack/day for 35.0 years (35.0 ttl pk-yrs)    Types: Cigarettes    Start date: 68    Quit date: 2014    Years since quitting: 11.6    Passive exposure: Past   Smokeless tobacco: Never  Vaping Use   Vaping status: Never Used  Substance and Sexual Activity   Alcohol use: Not Currently    Alcohol/week: 2.0 standard drinks of alcohol    Types: 2 Cans of beer per week   Drug use: Yes    Types: Marijuana    Comment: last: 1 week ago   Sexual activity: Not on file  Other  Topics Concern   Not on file  Social History Narrative   Not on file   Social Drivers of Health   Financial Resource Strain: Not on file  Food Insecurity: No Food Insecurity (12/27/2023)   Hunger Vital Sign    Worried About Running Out of Food in the Last Year: Never true    Ran Out of Food in the Last Year: Never true  Transportation Needs: No Transportation Needs (12/27/2023)   PRAPARE - Administrator, Civil Service (Medical): No    Lack of Transportation (Non-Medical): No  Physical Activity: Not on file  Stress: Not on file  Social Connections: Patient Declined (12/27/2023)   Social Connection and Isolation Panel    Frequency of Communication with Friends and Family: Patient declined    Frequency of Social Gatherings with Friends and Family: Patient declined    Attends Religious Services: Patient declined    Database administrator or Organizations: Patient declined    Attends Banker Meetings: Patient declined    Marital Status: Patient declined  Intimate Partner Violence: Not At Risk (12/27/2023)   Humiliation, Afraid, Rape, and Kick questionnaire    Fear of Current or Ex-Partner: No    Emotionally Abused: No    Physically Abused: No    Sexually Abused: No    Family History: Family History  Problem Relation Age of Onset   Kidney cancer Sister    Prostate cancer Brother     Current Medications:  Current Outpatient Medications:    albuterol  (VENTOLIN  HFA) 108 (90 Base) MCG/ACT inhaler, Inhale 1 puff into the lungs every 4 (four) hours as needed for wheezing., Disp: 18 g, Rfl: 1   binimetinib  (MEKTOVI ) 15 MG tablet, Take 3 tablets (45 mg total) by mouth 2 (two) times daily., Disp: 180 tablet, Rfl: 0   budesonide -glycopyrrolate -formoterol  (BREZTRI  AEROSPHERE) 160-9-4.8 MCG/ACT AERO inhaler, Inhale 2 puffs into the lungs in the morning and at bedtime., Disp: 10.7 g, Rfl: 1   cetirizine (ZYRTEC) 10 MG tablet, Take 10 mg by  mouth in the morning., Disp:  , Rfl:    citalopram  (CELEXA ) 20 MG tablet, Take 20 mg by mouth in the morning., Disp: , Rfl:    encorafenib  (BRAFTOVI ) 75 MG capsule, Take 6 capsules (450 mg total) by mouth daily., Disp: 180 capsule, Rfl: 0   EPINEPHrine  0.3 mg/0.3 mL IJ SOAJ injection, Inject 0.3 mg into the muscle as needed for anaphylaxis., Disp: , Rfl:    guaiFENesin  (MUCINEX ) 600 MG 12 hr tablet, Take 2 tablets (1,200 mg total) by mouth 2 (two) times daily., Disp: 60 tablet, Rfl: 0   metoprolol  tartrate (LOPRESSOR ) 50 MG tablet, Take 1 tablet (50 mg total) by mouth 2 (two) times daily., Disp: 60 tablet, Rfl: 3   prochlorperazine  (COMPAZINE ) 10 MG tablet, TAKE 1 TABLET(10 MG) BY MOUTH EVERY 6 HOURS AS NEEDED, Disp: 60 tablet, Rfl: 2   warfarin (COUMADIN ) 10 MG tablet, Take 1 tablet (10 mg total) by mouth daily. Or as directed by coumadin  clinic, Disp: 30 tablet, Rfl: 3 No current facility-administered medications for this visit.  Facility-Administered Medications Ordered in Other Visits:    heparin  lock flush 100 unit/mL, 500 Units, Intravenous, Once, Briona Korpela, MD   Allergies: Allergies  Allergen Reactions   Bee Venom Anaphylaxis    syncope    REVIEW OF SYSTEMS:   Review of Systems  Constitutional:  Negative for chills, fatigue and fever.  HENT:   Negative for lump/mass, mouth sores, nosebleeds, sore throat and trouble swallowing.   Eyes:  Negative for eye problems.  Respiratory:  Negative for cough and shortness of breath.   Cardiovascular:  Negative for chest pain, leg swelling and palpitations.  Gastrointestinal:  Positive for diarrhea. Negative for abdominal pain, constipation, nausea and vomiting. Rectal pain: occasional. Genitourinary:  Negative for bladder incontinence, difficulty urinating, dysuria, frequency, hematuria and nocturia.   Musculoskeletal:  Negative for arthralgias, back pain, flank pain, myalgias and neck pain.       +abdominal pain  Skin:  Negative for itching and rash.   Neurological:  Negative for dizziness, headaches and numbness.  Hematological:  Does not bruise/bleed easily.  Psychiatric/Behavioral:  Negative for depression, sleep disturbance and suicidal ideas. The patient is not nervous/anxious.   All other systems reviewed and are negative.    VITALS:   There were no vitals taken for this visit.  Wt Readings from Last 3 Encounters:  03/21/24 273 lb 9.5 oz (124.1 kg)  02/29/24 274 lb 11.1 oz (124.6 kg)  02/27/24 275 lb 9.6 oz (125 kg)    There is no height or weight on file to calculate BMI.  Performance status (ECOG): 1 - Symptomatic but completely ambulatory  PHYSICAL EXAM:   Physical Exam Vitals and nursing note reviewed. Exam conducted with a chaperone present.  Constitutional:      Appearance: Normal appearance.  Cardiovascular:     Rate and Rhythm: Normal rate and regular rhythm.     Pulses: Normal pulses.     Heart sounds: Normal heart sounds.  Pulmonary:     Effort: Pulmonary effort is normal.     Breath sounds: Normal breath sounds.  Abdominal:     Palpations: Abdomen is soft. There is no hepatomegaly, splenomegaly or mass.     Tenderness: There is no abdominal tenderness.  Musculoskeletal:     Right lower leg: No edema.     Left lower leg: No edema.  Lymphadenopathy:     Cervical: No cervical adenopathy.     Right cervical: No superficial, deep or  posterior cervical adenopathy.    Left cervical: No superficial, deep or posterior cervical adenopathy.     Upper Body:     Right upper body: No supraclavicular or axillary adenopathy.     Left upper body: No supraclavicular or axillary adenopathy.  Neurological:     General: No focal deficit present.     Mental Status: Jose Joseph is alert and oriented to person, place, and time.  Psychiatric:        Mood and Affect: Mood normal.        Behavior: Behavior normal.     LABS:   CBC    Component Value Date/Time   WBC 8.1 03/21/2024 1304   RBC 4.33 03/21/2024 1304   HGB 13.4  03/21/2024 1304   HCT 39.9 03/21/2024 1304   PLT 243 03/21/2024 1304   MCV 92.1 03/21/2024 1304   MCH 30.9 03/21/2024 1304   MCHC 33.6 03/21/2024 1304   RDW 13.7 03/21/2024 1304   LYMPHSABS 1.0 03/21/2024 1304   MONOABS 0.9 03/21/2024 1304   EOSABS 0.4 03/21/2024 1304   BASOSABS 0.0 03/21/2024 1304    CMP    Component Value Date/Time   NA 136 03/21/2024 1304   K 3.9 03/21/2024 1304   CL 101 03/21/2024 1304   CO2 24 03/21/2024 1304   GLUCOSE 105 (H) 03/21/2024 1304   BUN 24 (H) 03/21/2024 1304   CREATININE 1.11 03/21/2024 1304   CALCIUM 9.1 03/21/2024 1304   PROT 7.3 03/21/2024 1304   ALBUMIN 3.2 (L) 03/21/2024 1304   AST 12 (L) 03/21/2024 1304   ALT 10 03/21/2024 1304   ALKPHOS 80 03/21/2024 1304   BILITOT 0.7 03/21/2024 1304   GFRNONAA >60 03/21/2024 1304   GFRAA  01/08/2009 1449    >60        The eGFR has been calculated using the MDRD equation. This calculation has not been validated in all clinical situations. eGFR's persistently <60 mL/min signify possible Chronic Kidney Disease.     No results found for: CEA1, CEA / No results found for: CEA1, CEA No results found for: PSA1 No results found for: CAN199 No results found for: CAN125  No results found for: TOTALPROTELP, ALBUMINELP, A1GS, A2GS, BETS, BETA2SER, GAMS, MSPIKE, SPEI No results found for: TIBC, FERRITIN, IRONPCTSAT Lab Results  Component Value Date   LDH 201 (H) 12/29/2023     STUDIES:   CT CHEST ABDOMEN PELVIS W CONTRAST Result Date: 03/17/2024 EXAM: CT CHEST, ABDOMEN AND PELVIS WITH CONTRAST 03/11/2024 04:30:44 PM TECHNIQUE: CT of the chest, abdomen and pelvis was performed with the administration of intravenous contrast. Multiplanar reformatted images are provided for review. Automated exposure control, iterative reconstruction, and/or weight based adjustment of the mA/kV was utilized to reduce the radiation dose to as low as reasonably achievable.  COMPARISON: 12/26/2023 and previous. CLINICAL HISTORY: Metastatic disease evaluation. Metastatic disease evaluation, Adenocarcinoma of right lung. FINDINGS: CHEST: MEDIASTINUM: Right IJ port catheter to the cavoatrial junction. Pericardial fluid. Multiple sclerotic sternal metastases. THORACIC LYMPH NODES: No mediastinal, hilar or axillary lymphadenopathy. LUNGS AND PLEURA: Post-radiation changes in the right mid lung and superior segment left lower lobe. Regression of dominant pulmonary nodules, for example, a 7 mm subpleural nodule in the superior segment of the right lower lobe, previously 16 mm. There remain multiple subcentimeter pleural-based and subpleural nodules. ABDOMEN AND PELVIS: LIVER: scattered subcentimeter low attenuation foci in the right hepatic lobe which were not evident on previous unenhanced CT imaging. Nonspecific. GALLBLADDER AND BILE DUCTS: Gallbladder is unremarkable.  No biliary ductal dilatation. SPLEEN: No acute abnormality. PANCREAS: No acute abnormality. ADRENAL GLANDS: No acute abnormality. KIDNEYS, URETERS AND BLADDER: Bilateral simple-appearing renal cysts, largest 6.8 cm inferior left. No stones in the kidneys or ureters. No hydronephrosis. No perinephric or periureteral stranding. Residual contrast material in the urinary bladder. GI AND BOWEL: Multiple sigmoid diverticula without adjacent inflammatory or edematous change. There is no bowel obstruction. REPRODUCTIVE ORGANS: Mild prostatic enlargement. PERITONEUM AND RETROPERITONEUM: No ascites. No free air. VASCULATURE: Scattered aortic plaque in the distal arch and abdominal aorta. Mildly atheromatous abdominal aorta and bilateral common iliac arteries. ABDOMINAL AND PELVIS LYMPH NODES: No lymphadenopathy. REPRODUCTIVE ORGANS: No acute abnormality. BONES AND SOFT TISSUES: Multiple thoracic and lumbar sclerotic lesions. Old vertebral compression deformities T8 and T9. Multiple sclerotic rib lesions. Sclerotic right iliac lesion as  noted on prior PET CT. New sclerotic lesion centrally in the sacrum. Left hip arthroplasty. Right hip arthroplasty. Multiple subcutaneous nodules in the lower abdomen, presumably related to injections. IMPRESSION: 1. Regression of dominant pulmonary nodules, with a 7 mm subpleural nodule in the superior segment of the right lower lobe, previously 16 mm. Multiple subcentimeter pleural-based and subpleural nodules remain. 2. Multiple sclerotic bone metastases including a new lesion centrally in the sacrum. Electronically signed by: Katheleen Faes MD 03/17/2024 08:48 AM EDT RP Workstation: [id number]   MR Brain W Wo Contrast Result Date: 03/12/2024 CLINICAL DATA:  Provided history: Adenocarcinoma right lung. Metastatic disease evaluation. EXAM: MRI HEAD WITHOUT AND WITH CONTRAST TECHNIQUE: Multiplanar, multiecho pulse sequences of the brain and surrounding structures were obtained without and with intravenous contrast. CONTRAST:  10mL GADAVIST  GADOBUTROL  1 MMOL/ML IV SOLN COMPARISON:  Head CT 12/27/2023.  Brain MRI 12/06/2023. FINDINGS: Brain: No age-advanced or lobar predominant cerebral atrophy. Multifocal T2 FLAIR hyperintense signal abnormality within the cerebral white matter and pons, nonspecific but compatible with mild-to-moderate chronic small vessel ischemic disease. This includes a chronic lacunar infarct within the right frontal lobe centrum semiovale, which is new from the prior MRI of 12/06/2023 (series 9, image 36). Unchanged punctate chronic microhemorrhage within the mid right frontal lobe. Unchanged tiny chronic infarct within the right cerebellar hemisphere (series 8, image 5). There is no acute infarct. No evidence of an intracranial mass. No extra-axial fluid collection. No midline shift. No pathologic intracranial enhancement identified. Vascular: Maintained flow voids within the proximal large arterial vessels. Skull and upper cervical spine: No focal worrisome marrow lesion. Sinuses/Orbits: No  mass or acute finding within the imaged orbits. 10 mm mucous retention cyst within the left maxillary sinus. IMPRESSION: 1. No evidence of intracranial metastatic disease. 2. Chronic small vessel ischemic disease with small chronic infarcts, as described. 3. 10 mm left maxillary sinus mucous retention cyst. Electronically Signed   By: Rockey Childs D.O.   On: 03/12/2024 08:17

## 2024-03-21 NOTE — Progress Notes (Signed)
 Patient is taking Mektovi  and Braftovi  as prescribed. He has not missed any doses and reports no side effects at this time.

## 2024-03-22 ENCOUNTER — Other Ambulatory Visit (HOSPITAL_COMMUNITY): Payer: Self-pay

## 2024-03-25 ENCOUNTER — Ambulatory Visit: Attending: Internal Medicine | Admitting: *Deleted

## 2024-03-25 DIAGNOSIS — Z5181 Encounter for therapeutic drug level monitoring: Secondary | ICD-10-CM | POA: Insufficient documentation

## 2024-03-25 DIAGNOSIS — I4891 Unspecified atrial fibrillation: Secondary | ICD-10-CM | POA: Diagnosis present

## 2024-03-25 LAB — POCT INR: INR: 2.8 (ref 2.0–3.0)

## 2024-03-25 NOTE — Patient Instructions (Signed)
 Continue warfarin 1 tablet daily except 1/2 tablet on Mondays. Recheck in 2 wk.

## 2024-03-25 NOTE — Progress Notes (Signed)
 INR-2.8 Please see anticoagulation encounter

## 2024-03-27 ENCOUNTER — Inpatient Hospital Stay: Admitting: Licensed Clinical Social Worker

## 2024-03-27 DIAGNOSIS — C3491 Malignant neoplasm of unspecified part of right bronchus or lung: Secondary | ICD-10-CM

## 2024-03-27 NOTE — Progress Notes (Signed)
 [facility] CSW Progress Note  Clinical Child psychotherapist contacted patient by phone to follow-up on advanced directives.    Interventions: CSW discussed completing advanced directives w/ pt.  Pt requested a packet be mailed to his home.  Upon reviewing the packet pt will call to schedule an appointment to complete and notarize documents.        Follow Up Plan:  Patient will contact CSW with any support or resource needs    Devere JONELLE Manna, LCSW Clinical Social Worker [facility]

## 2024-04-10 ENCOUNTER — Ambulatory Visit: Attending: Internal Medicine | Admitting: *Deleted

## 2024-04-10 DIAGNOSIS — I4891 Unspecified atrial fibrillation: Secondary | ICD-10-CM | POA: Diagnosis present

## 2024-04-10 DIAGNOSIS — Z5181 Encounter for therapeutic drug level monitoring: Secondary | ICD-10-CM | POA: Insufficient documentation

## 2024-04-10 LAB — POCT INR: INR: 2.5 (ref 2.0–3.0)

## 2024-04-10 NOTE — Patient Instructions (Signed)
 Continue warfarin 1 tablet daily except 1/2 tablet on Mondays. Recheck in 3 wk.

## 2024-04-10 NOTE — Progress Notes (Signed)
 INR 2.5. Please see anticoagulation encounter

## 2024-04-11 ENCOUNTER — Other Ambulatory Visit: Payer: Self-pay

## 2024-04-11 ENCOUNTER — Ambulatory Visit (HOSPITAL_COMMUNITY)
Admission: RE | Admit: 2024-04-11 | Discharge: 2024-04-11 | Disposition: A | Discharge status: Home / Self Care | Source: Ambulatory Visit | Attending: Hematology | Admitting: Hematology

## 2024-04-11 DIAGNOSIS — C3491 Malignant neoplasm of unspecified part of right bronchus or lung: Secondary | ICD-10-CM | POA: Insufficient documentation

## 2024-04-11 DIAGNOSIS — Z79899 Other long term (current) drug therapy: Secondary | ICD-10-CM | POA: Diagnosis present

## 2024-04-11 LAB — ECHOCARDIOGRAM COMPLETE
AR max vel: 3.02 cm2
AV Area VTI: 3.32 cm2
AV Area mean vel: 2.94 cm2
AV Mean grad: 4 mmHg
AV Peak grad: 7.4 mmHg
Ao pk vel: 1.36 m/s
Area-P 1/2: 2.69 cm2
P 1/2 time: 673 ms
S' Lateral: 3.9 cm

## 2024-04-12 ENCOUNTER — Other Ambulatory Visit: Payer: Self-pay

## 2024-04-12 ENCOUNTER — Other Ambulatory Visit: Payer: Self-pay | Admitting: Oncology

## 2024-04-12 DIAGNOSIS — C3490 Malignant neoplasm of unspecified part of unspecified bronchus or lung: Secondary | ICD-10-CM

## 2024-04-12 MED ORDER — MEKTOVI 15 MG PO TABS
45.0000 mg | ORAL_TABLET | Freq: Two times a day (BID) | ORAL | 0 refills | Status: DC
  Filled 2024-04-12: qty 180, 30d supply, fill #0

## 2024-04-12 MED ORDER — BRAFTOVI 75 MG PO CAPS
450.0000 mg | ORAL_CAPSULE | Freq: Every day | ORAL | 0 refills | Status: DC
  Filled 2024-04-12: qty 180, 30d supply, fill #0

## 2024-04-12 NOTE — Progress Notes (Signed)
 Specialty Pharmacy Ongoing Clinical Assessment Note  Jose Joseph is a 68 y.o. male who is being followed by the specialty pharmacy service for RxSp Oncology   Patient's specialty medication(s) reviewed today: Binimetinib  (Mektovi ); Encorafenib  (Braftovi )   Missed doses in the last 4 weeks: 0   Patient/Caregiver did not have any additional questions or concerns.   Therapeutic benefit summary: Patient is achieving benefit   Adverse events/side effects summary: No adverse events/side effects   Patient's therapy is appropriate to: Continue    Goals Addressed             This Visit's Progress    Slow Disease Progression   On track    Patient is on track. Patient will maintain adherence. Per Dr Rogers at appt on 03/21/24, CT CAP showed regression of dominant pulmonary nodules. Also showed a new lesion on sacrum that they will monitor. MRI of brain showed no evidence of metastatic disease.        Follow up: 3 months  [facility]

## 2024-04-27 ENCOUNTER — Other Ambulatory Visit: Payer: Self-pay | Admitting: Internal Medicine

## 2024-04-29 ENCOUNTER — Other Ambulatory Visit: Payer: Self-pay

## 2024-04-29 ENCOUNTER — Other Ambulatory Visit (HOSPITAL_COMMUNITY): Payer: Self-pay

## 2024-05-01 ENCOUNTER — Other Ambulatory Visit: Payer: Self-pay

## 2024-05-01 ENCOUNTER — Emergency Department (HOSPITAL_COMMUNITY)

## 2024-05-01 ENCOUNTER — Inpatient Hospital Stay (HOSPITAL_COMMUNITY)
Admission: EM | Admit: 2024-05-01 | Discharge: 2024-05-03 | DRG: 309 | Disposition: A | Discharge status: Home / Self Care | Attending: Internal Medicine | Admitting: Internal Medicine

## 2024-05-01 ENCOUNTER — Encounter (HOSPITAL_COMMUNITY): Payer: Self-pay

## 2024-05-01 ENCOUNTER — Ambulatory Visit: Admitting: *Deleted

## 2024-05-01 DIAGNOSIS — Z87891 Personal history of nicotine dependence: Secondary | ICD-10-CM

## 2024-05-01 DIAGNOSIS — R791 Abnormal coagulation profile: Secondary | ICD-10-CM | POA: Diagnosis present

## 2024-05-01 DIAGNOSIS — I480 Paroxysmal atrial fibrillation: Secondary | ICD-10-CM | POA: Diagnosis not present

## 2024-05-01 DIAGNOSIS — C7951 Secondary malignant neoplasm of bone: Secondary | ICD-10-CM | POA: Diagnosis present

## 2024-05-01 DIAGNOSIS — R6510 Systemic inflammatory response syndrome (SIRS) of non-infectious origin without acute organ dysfunction: Secondary | ICD-10-CM | POA: Diagnosis present

## 2024-05-01 DIAGNOSIS — Z1152 Encounter for screening for COVID-19: Secondary | ICD-10-CM

## 2024-05-01 DIAGNOSIS — Z7901 Long term (current) use of anticoagulants: Secondary | ICD-10-CM | POA: Diagnosis not present

## 2024-05-01 DIAGNOSIS — I959 Hypotension, unspecified: Secondary | ICD-10-CM | POA: Diagnosis not present

## 2024-05-01 DIAGNOSIS — Z79899 Other long term (current) drug therapy: Secondary | ICD-10-CM

## 2024-05-01 DIAGNOSIS — I4891 Unspecified atrial fibrillation: Principal | ICD-10-CM

## 2024-05-01 DIAGNOSIS — Z860100 Personal history of colon polyps, unspecified: Secondary | ICD-10-CM

## 2024-05-01 DIAGNOSIS — J449 Chronic obstructive pulmonary disease, unspecified: Secondary | ICD-10-CM | POA: Diagnosis present

## 2024-05-01 DIAGNOSIS — Z91030 Bee allergy status: Secondary | ICD-10-CM

## 2024-05-01 DIAGNOSIS — F32A Depression, unspecified: Secondary | ICD-10-CM | POA: Diagnosis present

## 2024-05-01 DIAGNOSIS — Z5181 Encounter for therapeutic drug level monitoring: Secondary | ICD-10-CM

## 2024-05-01 DIAGNOSIS — Z8042 Family history of malignant neoplasm of prostate: Secondary | ICD-10-CM

## 2024-05-01 DIAGNOSIS — Z96642 Presence of left artificial hip joint: Secondary | ICD-10-CM | POA: Diagnosis present

## 2024-05-01 DIAGNOSIS — E872 Acidosis, unspecified: Secondary | ICD-10-CM | POA: Diagnosis present

## 2024-05-01 DIAGNOSIS — Z8673 Personal history of transient ischemic attack (TIA), and cerebral infarction without residual deficits: Secondary | ICD-10-CM | POA: Diagnosis not present

## 2024-05-01 DIAGNOSIS — J4489 Other specified chronic obstructive pulmonary disease: Secondary | ICD-10-CM | POA: Diagnosis present

## 2024-05-01 DIAGNOSIS — Z7951 Long term (current) use of inhaled steroids: Secondary | ICD-10-CM

## 2024-05-01 DIAGNOSIS — C3490 Malignant neoplasm of unspecified part of unspecified bronchus or lung: Secondary | ICD-10-CM | POA: Diagnosis present

## 2024-05-01 DIAGNOSIS — Z8051 Family history of malignant neoplasm of kidney: Secondary | ICD-10-CM

## 2024-05-01 DIAGNOSIS — I4819 Other persistent atrial fibrillation: Secondary | ICD-10-CM | POA: Diagnosis present

## 2024-05-01 DIAGNOSIS — J438 Other emphysema: Secondary | ICD-10-CM

## 2024-05-01 DIAGNOSIS — Z6834 Body mass index (BMI) 34.0-34.9, adult: Secondary | ICD-10-CM

## 2024-05-01 DIAGNOSIS — I10 Essential (primary) hypertension: Secondary | ICD-10-CM | POA: Diagnosis present

## 2024-05-01 DIAGNOSIS — I3139 Other pericardial effusion (noninflammatory): Secondary | ICD-10-CM

## 2024-05-01 DIAGNOSIS — C3411 Malignant neoplasm of upper lobe, right bronchus or lung: Secondary | ICD-10-CM | POA: Diagnosis present

## 2024-05-01 DIAGNOSIS — D6869 Other thrombophilia: Secondary | ICD-10-CM | POA: Insufficient documentation

## 2024-05-01 DIAGNOSIS — E66811 Obesity, class 1: Secondary | ICD-10-CM | POA: Diagnosis present

## 2024-05-01 LAB — CBC WITH DIFFERENTIAL/PLATELET
Abs Immature Granulocytes: 0.25 K/uL — ABNORMAL HIGH (ref 0.00–0.07)
Basophils Absolute: 0.1 K/uL (ref 0.0–0.1)
Basophils Relative: 1 %
Eosinophils Absolute: 0.8 K/uL — ABNORMAL HIGH (ref 0.0–0.5)
Eosinophils Relative: 6 %
HCT: 46.5 % (ref 39.0–52.0)
Hemoglobin: 15.2 g/dL (ref 13.0–17.0)
Immature Granulocytes: 2 %
Lymphocytes Relative: 7 %
Lymphs Abs: 1.1 K/uL (ref 0.7–4.0)
MCH: 30 pg (ref 26.0–34.0)
MCHC: 32.7 g/dL (ref 30.0–36.0)
MCV: 91.7 fL (ref 80.0–100.0)
Monocytes Absolute: 1.6 K/uL — ABNORMAL HIGH (ref 0.1–1.0)
Monocytes Relative: 11 %
Neutro Abs: 10.9 K/uL — ABNORMAL HIGH (ref 1.7–7.7)
Neutrophils Relative %: 73 %
Platelets: 324 K/uL (ref 150–400)
RBC: 5.07 MIL/uL (ref 4.22–5.81)
RDW: 12.7 % (ref 11.5–15.5)
WBC: 14.7 K/uL — ABNORMAL HIGH (ref 4.0–10.5)
nRBC: 0 % (ref 0.0–0.2)

## 2024-05-01 LAB — URINALYSIS, W/ REFLEX TO CULTURE (INFECTION SUSPECTED)
Bilirubin Urine: NEGATIVE
Glucose, UA: NEGATIVE mg/dL
Ketones, ur: NEGATIVE mg/dL
Leukocytes,Ua: NEGATIVE
Nitrite: NEGATIVE
Protein, ur: NEGATIVE mg/dL
Specific Gravity, Urine: 1.015 (ref 1.005–1.030)
pH: 5 (ref 5.0–8.0)

## 2024-05-01 LAB — COMPREHENSIVE METABOLIC PANEL WITH GFR
ALT: 12 U/L (ref 0–44)
AST: 17 U/L (ref 15–41)
Albumin: 3.3 g/dL — ABNORMAL LOW (ref 3.5–5.0)
Alkaline Phosphatase: 71 U/L (ref 38–126)
Anion gap: 18 — ABNORMAL HIGH (ref 5–15)
BUN: 25 mg/dL — ABNORMAL HIGH (ref 8–23)
CO2: 21 mmol/L — ABNORMAL LOW (ref 22–32)
Calcium: 10.2 mg/dL (ref 8.9–10.3)
Chloride: 96 mmol/L — ABNORMAL LOW (ref 98–111)
Creatinine, Ser: 1.37 mg/dL — ABNORMAL HIGH (ref 0.61–1.24)
GFR, Estimated: 55 mL/min — ABNORMAL LOW (ref >60)
Glucose, Bld: 103 mg/dL — ABNORMAL HIGH (ref 70–99)
Potassium: 4.2 mmol/L (ref 3.5–5.1)
Sodium: 135 mmol/L (ref 135–145)
Total Bilirubin: 0.5 mg/dL (ref 0.0–1.2)
Total Protein: 8.4 g/dL — ABNORMAL HIGH (ref 6.5–8.1)

## 2024-05-01 LAB — TROPONIN I (HIGH SENSITIVITY)
Troponin I (High Sensitivity): 8 ng/L (ref <18)
Troponin I (High Sensitivity): 6 ng/L (ref <18)

## 2024-05-01 LAB — RESP PANEL BY RT-PCR (RSV, FLU A&B, COVID)  RVPGX2
Influenza A by PCR: NEGATIVE
Influenza B by PCR: NEGATIVE
Resp Syncytial Virus by PCR: NEGATIVE
SARS Coronavirus 2 by RT PCR: NEGATIVE

## 2024-05-01 LAB — LACTIC ACID, PLASMA
Lactic Acid, Venous: 2.5 mmol/L (ref 0.5–1.9)
Lactic Acid, Venous: 1.4 mmol/L (ref 0.5–1.9)

## 2024-05-01 LAB — PROTIME-INR
INR: 4 — ABNORMAL HIGH (ref 0.8–1.2)
Prothrombin Time: 41 s — ABNORMAL HIGH (ref 11.4–15.2)

## 2024-05-01 LAB — POCT INR: INR: 5.8 — AB (ref 2.0–3.0)

## 2024-05-01 LAB — ECHOCARDIOGRAM COMPLETE
Area-P 1/2: 4.49 cm2
Height: 73 in
S' Lateral: 3.5 cm
Weight: 4192 [oz_av]

## 2024-05-01 MED ORDER — SODIUM CHLORIDE 0.9 % IV BOLUS
1000.0000 mL | Freq: Once | INTRAVENOUS | Status: AC
Start: 2024-05-01 — End: 2024-05-01
  Administered 2024-05-01: 1000 mL via INTRAVENOUS

## 2024-05-01 MED ORDER — VANCOMYCIN HCL 2000 MG/400ML IV SOLN
2000.0000 mg | Freq: Once | INTRAVENOUS | Status: AC
Start: 2024-05-01 — End: 2024-05-01
  Administered 2024-05-01: 2000 mg via INTRAVENOUS
  Filled 2024-05-01: qty 400

## 2024-05-01 MED ORDER — CITALOPRAM HYDROBROMIDE 20 MG PO TABS
20.0000 mg | ORAL_TABLET | Freq: Every day | ORAL | Status: DC
  Administered 2024-05-01 – 2024-05-03 (×3): 20 mg via ORAL
  Filled 2024-05-01 (×3): qty 1

## 2024-05-01 MED ORDER — BUDESON-GLYCOPYRROL-FORMOTEROL 160-9-4.8 MCG/ACT IN AERO
2.0000 | INHALATION_SPRAY | Freq: Two times a day (BID) | RESPIRATORY_TRACT | Status: DC
Start: 2024-05-01 — End: 2024-05-03
  Administered 2024-05-01 – 2024-05-03 (×4): 2 via RESPIRATORY_TRACT
  Filled 2024-05-01 (×2): qty 5.9

## 2024-05-01 MED ORDER — VANCOMYCIN HCL 1500 MG/300ML IV SOLN: 1500.0000 mg | INTRAVENOUS | Status: DC

## 2024-05-01 MED ORDER — SODIUM CHLORIDE 0.9 % IV SOLN
2.0000 g | Freq: Three times a day (TID) | INTRAVENOUS | Status: DC
  Administered 2024-05-01 – 2024-05-03 (×6): 2 g via INTRAVENOUS
  Filled 2024-05-01 (×6): qty 12.5

## 2024-05-01 MED ORDER — SODIUM CHLORIDE 0.9 % IV SOLN
2.0000 g | Freq: Once | INTRAVENOUS | Status: AC
  Administered 2024-05-01: 2 g via INTRAVENOUS
  Filled 2024-05-01: qty 20

## 2024-05-01 MED ORDER — ENSURE PLUS HIGH PROTEIN PO LIQD
237.0000 mL | Freq: Two times a day (BID) | ORAL | Status: DC
  Administered 2024-05-02 – 2024-05-03 (×3): 237 mL via ORAL

## 2024-05-01 MED ORDER — LACTATED RINGERS IV SOLN: INTRAVENOUS | Status: AC

## 2024-05-01 MED ORDER — LORATADINE 10 MG PO TABS
10.0000 mg | ORAL_TABLET | Freq: Every day | ORAL | Status: DC
Start: 2024-05-01 — End: 2024-05-03
  Administered 2024-05-01 – 2024-05-03 (×3): 10 mg via ORAL
  Filled 2024-05-01 (×3): qty 1

## 2024-05-01 MED ORDER — OXYCODONE HCL 5 MG PO TABS
5.0000 mg | ORAL_TABLET | ORAL | Status: DC | PRN
  Administered 2024-05-01 – 2024-05-03 (×6): 5 mg via ORAL
  Filled 2024-05-01 (×7): qty 1

## 2024-05-01 MED ORDER — ACETAMINOPHEN 325 MG PO TABS
650.0000 mg | ORAL_TABLET | Freq: Four times a day (QID) | ORAL | Status: DC | PRN
  Administered 2024-05-01 – 2024-05-02 (×4): 650 mg via ORAL
  Filled 2024-05-01 (×4): qty 2

## 2024-05-01 MED ORDER — CHLORHEXIDINE GLUCONATE CLOTH 2 % EX PADS
6.0000 | MEDICATED_PAD | Freq: Every day | CUTANEOUS | Status: DC
  Administered 2024-05-02 – 2024-05-03 (×2): 6 via TOPICAL

## 2024-05-01 MED ORDER — PERFLUTREN LIPID MICROSPHERE
1.0000 mL | INTRAVENOUS | Status: AC | PRN
  Administered 2024-05-01: 3 mL via INTRAVENOUS

## 2024-05-01 MED ORDER — SODIUM CHLORIDE 0.9 % IV BOLUS
1000.0000 mL | Freq: Once | INTRAVENOUS | Status: AC
  Administered 2024-05-01: 1000 mL via INTRAVENOUS

## 2024-05-01 MED ORDER — METOPROLOL TARTRATE 50 MG PO TABS
50.0000 mg | ORAL_TABLET | Freq: Two times a day (BID) | ORAL | Status: DC
  Administered 2024-05-01 – 2024-05-03 (×4): 50 mg via ORAL
  Filled 2024-05-01 (×4): qty 1

## 2024-05-01 MED ORDER — PROPOFOL 10 MG/ML IV BOLUS
0.5000 mg/kg | Freq: Once | INTRAVENOUS | Status: AC
  Administered 2024-05-01: 59.4 mg via INTRAVENOUS
  Filled 2024-05-01: qty 20

## 2024-05-01 NOTE — Plan of Care (Signed)

## 2024-05-01 NOTE — Hospital Course (Signed)
 68 year old male with a history of persistent atrial fibrillation status post DCCV May 2025, metastatic lung adenocarcinoma, COPD, hypertension, stroke, depression presenting with 2 to 3-day history of shortness of breath, generalized weakness, and dizziness.  Patient states that he was taking his blood pressure at home, systolic blood pressure was running in 80s.  He states that it felt like he was not able to catch a full breath.  He denied any fever, chills, chest pain, nausea, vomiting or diarrhea, abdominal pain.  He complains of a chronic cough.  He denies any hemoptysis.  He denies any hematochezia or melena.  He denies any dysuria or hematuria. Currently, patient is following medical oncology for his metastatic lung cancer. Due to interactions with chemotherapy, DOAC's are not preferred.  In addition, amiodarone and diltiazem  cannot be used due to drug-drug interactions.  He continues on warfarin.  In the ED, the patient was afebrile but tachycardic into 120s.  His BPs were soft with SBP in the 80s. WBC 14.7, hemoglobin 15.2, platelets 324.  Sodium 135, potassium 4.2, bicarbonate 21, serum creatinine 1.37.  Lactic acid 2.5.  Troponin 8.  INR 4.0.  Chest x-ray was negative for pulmonary edema or consolidation.  EKG showed atrial fibrillation with nonspecific ST changes. The patient was cardioverted in the emergency department under recommendation of cardiology.  He was admitted for further evaluation and treatment of his SIRS

## 2024-05-01 NOTE — Progress Notes (Signed)
 Pharmacy Antibiotic Note  Jose Joseph is a 68 y.o. male admitted on 05/01/2024 with sepsis.  Pharmacy has been consulted for vancomycin  dosing.  Plan: Vancomycin  2000 mg IV x 1 dose Vancomycin  1500 mg IV every 24 hours. Cefepime  2000 mg IV every 8 hours. Monitor labs, c/s, and vanco levels as indicated.  Height: 6' 1 (185.4 cm) Weight: 118.8 kg (262 lb) IBW/kg (Calculated) : 79.9  Temp (24hrs), Avg:97.8 F (36.6 C), Min:97.5 F (36.4 C), Max:97.9 F (36.6 C)  Recent Labs  Lab 05/01/24 1020 05/01/24 1231  WBC 14.7*  --   CREATININE 1.37*  --   LATICACIDVEN 2.5* 1.4    Estimated Creatinine Clearance: 67.8 mL/min (A) (by C-G formula based on SCr of 1.37 mg/dL (H)).    Allergies  Allergen Reactions   Bee Venom Anaphylaxis    syncope    Antimicrobials this admission: Vanco 9/17 >> Cefepime  9/17 >> CTX 9/17  Microbiology results: 9/17 BCx: pending 9/17 MRSA PCR: pending   Thank you for allowing pharmacy to be a part of this patient's care.  Elspeth Sour, PharmD Clinical Pharmacist 05/01/2024 2:23 PM

## 2024-05-01 NOTE — Patient Instructions (Addendum)
 Hold warfarin 3 days then decrease dose to 1 tablet daily except 1/2 tablet on Mondays, Wednesdays and Friday. Recheck in 10 days. Pt not feeling well.  Having terrible back pain.  States he feels cold. Is diaphoretic and SOB.  Denies chest pain.  Pt is going to [facility] ED from here to be evaluated.  Wife is with patient.

## 2024-05-01 NOTE — Progress Notes (Signed)
 INR 5.8 Please see anticoagulation encounter

## 2024-05-01 NOTE — H&P (Signed)
 History and Physical    Patient: Jose Joseph DOB: Apr 28, 1956 DOA: 05/01/2024 DOS: the patient was seen and examined on 05/01/2024 PCP: Sheryle Carwin, MD  Patient coming from: Home  Chief Complaint:  Chief Complaint  Patient presents with   Shortness of Breath   HPI: Jose Joseph is a 68 year old male with a history of persistent atrial fibrillation status post DCCV May 2025, metastatic lung adenocarcinoma, COPD, hypertension, stroke, depression presenting with 2 to 3-day history of shortness of breath, generalized weakness, and dizziness.  Patient states that he was taking his blood pressure at home, systolic blood pressure was running in 80s.  He states that it felt like he was not able to catch a full breath.  He denied any fever, chills, chest pain, nausea, vomiting or diarrhea, abdominal pain.  He complains of a chronic cough.  He denies any hemoptysis.  He denies any hematochezia or melena.  He denies any dysuria or hematuria. Currently, patient is following medical oncology for his metastatic lung cancer. Due to interactions with chemotherapy, DOAC's are not preferred.  In addition, amiodarone and diltiazem  cannot be used due to drug-drug interactions.  He continues on warfarin.  In the ED, the patient was afebrile but tachycardic into 120s.  His BPs were soft with SBP in the 80s. WBC 14.7, hemoglobin 15.2, platelets 324.  Sodium 135, potassium 4.2, bicarbonate 21, serum creatinine 1.37.  Lactic acid 2.5.  Troponin 8.  INR 4.0.  Chest x-ray was negative for pulmonary edema or consolidation.  EKG showed atrial fibrillation with nonspecific ST changes. The patient was cardioverted in the emergency department under recommendation of cardiology.  He was admitted for further evaluation and treatment of his SIRS  Review of Systems: As mentioned in the history of present illness. All other systems reviewed and are negative. Past Medical History:  Diagnosis Date   Arthritis     Asthma    Atrial fibrillation (HCC)    Cancer (HCC)    Complication of anesthesia    after his neck surgery pt. trying to get up and walk out   COPD (chronic obstructive pulmonary disease) (HCC)    Depression    History of kidney stones    2021   Hypertension    Metastatic lung carcinoma, right (HCC) 08/2023   Stroke [facility])    Past Surgical History:  Procedure Laterality Date   BRONCHIAL BIOPSY  11/21/2023   Procedure: BRONCHOSCOPY, WITH BIOPSY;  Surgeon: Shelah Lamar RAMAN, MD;  Location: [facility] ENDOSCOPY;  Service: Pulmonary;;   BRONCHIAL NEEDLE ASPIRATION BIOPSY  11/21/2023   Procedure: BRONCHOSCOPY, WITH NEEDLE ASPIRATION BIOPSY;  Surgeon: Shelah Lamar RAMAN, MD;  Location: [facility] ENDOSCOPY;  Service: Pulmonary;;   CARDIOVERSION N/A 01/01/2024   Procedure: CARDIOVERSION;  Surgeon: Francyne Headland, MD;  Location: [facility] INVASIVE CV LAB;  Service: Cardiovascular;  Laterality: N/A;   COLONOSCOPY WITH PROPOFOL  N/A 01/19/2023   Procedure: COLONOSCOPY WITH PROPOFOL ;  Surgeon: Shaaron Lamar HERO, MD;  Location: [facility] ENDO SUITE;  Service: Endoscopy;  Laterality: N/A;  200pm, asa 2   ENDOBRONCHIAL ULTRASOUND Bilateral 11/21/2023   Procedure: ENDOBRONCHIAL ULTRASOUND (EBUS);  Surgeon: Shelah Lamar RAMAN, MD;  Location: [facility] ENDOSCOPY;  Service: Pulmonary;  Laterality: Bilateral;   HERNIA REPAIR     IR IMAGING GUIDED PORT INSERTION  12/14/2023   IR THORACENTESIS ASP PLEURAL SPACE W/IMG GUIDE  12/28/2023   KNEE ARTHROCENTESIS     NECK SURGERY     POLYPECTOMY  01/19/2023   Procedure: POLYPECTOMY;  Surgeon:  Shaaron Lamar HERO, MD;  Location: [facility] ENDO SUITE;  Service: Endoscopy;;   TOTAL HIP ARTHROPLASTY Left 01/15/2021   Procedure: LEFT TOTAL HIP ARTHROPLASTY ANTERIOR APPROACH;  Surgeon: Barbarann Oneil BROCKS, MD;  Location: [facility] OR;  Service: Orthopedics;  Laterality: Left;   TRANSESOPHAGEAL ECHOCARDIOGRAM (CATH LAB) N/A 01/01/2024   Procedure: TRANSESOPHAGEAL ECHOCARDIOGRAM;  Surgeon: Francyne Headland, MD;  Location: [facility] INVASIVE CV  LAB;  Service: Cardiovascular;  Laterality: N/A;   VIDEO BRONCHOSCOPY WITH ENDOBRONCHIAL NAVIGATION Bilateral 11/21/2023   Procedure: VIDEO BRONCHOSCOPY WITH ENDOBRONCHIAL NAVIGATION;  Surgeon: Shelah Lamar RAMAN, MD;  Location: [facility] ENDOSCOPY;  Service: Pulmonary;  Laterality: Bilateral;   WRIST ARTHROPLASTY     Social History:  reports that he quit smoking about 11 years ago. His smoking use included cigarettes. He started smoking about 46 years ago. He has a 35 pack-year smoking history. He has been exposed to tobacco smoke. He has never used smokeless tobacco. He reports that he does not currently use alcohol after a past usage of about 2.0 standard drinks of alcohol per week. He reports current drug use. Drug: Marijuana.  Allergies  Allergen Reactions   Bee Venom Anaphylaxis    syncope    Family History  Problem Relation Age of Onset   Kidney cancer Sister    Prostate cancer Brother     Prior to Admission medications   Medication Sig Start Date End Date Taking? Authorizing Provider  albuterol  (VENTOLIN  HFA) 108 (90 Base) MCG/ACT inhaler Inhale 1 puff into the lungs every 4 (four) hours as needed for wheezing. 01/02/24 01/01/25 Yes Tobie Yetta HERO, MD  binimetinib  (MEKTOVI ) 15 MG tablet Take 3 tablets (45 mg total) by mouth 2 (two) times daily. 04/12/24  Yes Davonna Siad, MD  budesonide -glycopyrrolate -formoterol  (BREZTRI  AEROSPHERE) 160-9-4.8 MCG/ACT AERO inhaler Inhale 2 puffs into the lungs in the morning and at bedtime. 01/02/24  Yes Tobie Yetta HERO, MD  cetirizine (ZYRTEC) 10 MG tablet Take 10 mg by mouth in the morning.   Yes [provider]  citalopram  (CELEXA ) 20 MG tablet Take 20 mg by mouth in the morning.   Yes [provider]  encorafenib  (BRAFTOVI ) 75 MG capsule Take 6 capsules (450 mg total) by mouth daily. Patient taking differently: Take 450 mg by mouth every evening. 04/12/24  Yes Davonna Siad, MD  EPINEPHrine  0.3 mg/0.3 mL IJ SOAJ injection Inject 0.3  mg into the muscle as needed for anaphylaxis. 02/14/24  Yes [provider]  metoprolol  tartrate (LOPRESSOR ) 50 MG tablet Take 1 tablet (50 mg total) by mouth 2 (two) times daily. 04/29/24  Yes Mallipeddi, Vishnu P, MD  prochlorperazine  (COMPAZINE ) 10 MG tablet TAKE 1 TABLET(10 MG) BY MOUTH EVERY 6 HOURS AS NEEDED 02/23/24  Yes Rogers Hai, MD  warfarin (COUMADIN ) 10 MG tablet Take 1 tablet (10 mg total) by mouth daily. Or as directed by coumadin  clinic Patient taking differently: Take 10 mg by mouth See admin instructions. Hold on 09/17,09/18, and 09/19 Then on 09/20 take 1 tablet (10 mg),Monday 1/2 tablet (5mg ), Tuesday 1 tablet (10 mg), Wednesday 1/2 tablet (5 mg), Thursday 1 tablet (10 mg), Friday 1/2 tablet ( 5 mg), Saturday 1 tablet (10 mg), Sunday 1 tablet (10 mg) MON 09/29 return for next INR. 02/19/24  Yes Mallipeddi, Diannah SQUIBB, MD    Physical Exam: Vitals:   05/01/24 1215 05/01/24 1230 05/01/24 1245 05/01/24 1300  BP: (!) 90/57 (!) 85/68 108/75 112/81  Pulse: 93 92 95 93  Resp: (!) 25 17 (!) 5 11  Temp:      TempSrc:      SpO2: 95% 96% 96% 95%  Weight:      Height:       GENERAL:  A&O x 3, NAD, well developed, cooperative, follows commands HEENT: [place]/AT, No thrush, No icterus, No oral ulcers Neck:  No neck mass, No meningismus, soft, supple CV: RRR, no S3, no S4, no rub, no JVD Lungs:  CTA, no wheeze, no rhonchi, good air movement Abd: soft/NT +BS, nondistended Ext: No edema, no lymphangitis, no cyanosis, no rashes Neuro:  CN II-XII intact, strength 4/5 in RUE, RLE, strength 4/5 LUE, LLE; sensation intact bilateral; no dysmetria; babinski equivocal  Data Reviewed: Data reviewed above in history  Assessment and Plan: SIRS/Hypotension - Patient presented with tachycardia and leukocytosis - Follow blood cultures - Obtain UA - Chest x-ray without consolidation - Start empiric IV antibiotics  Persistent atrial fibrillation with RVR - Status post DCCV in the  emergency department 05/01/24 - Continue metoprolol  - Continue warfarin - Repeat echo  Pericardial effusion - 04/03/2024 echo EF 55 to 60%, no WMA, no pericardial effusion noted, trivial MR  Stage IV adenocarcinoma of the lung - 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 -Braftovi  (Encorafenib ) 450 mg daily and Mektovi  (binimetinib ) 45 mg twice daily started on 12/28/2023   Hypotension - Continue IV fluids - Obtain UA  Lactic acidosis - Continue IV fluids  Class 1 obesity - BMI 34.57 - Lifestyle modification  COPD -stable on room air -Quit tobacco use 10 years ago of 1-2 ppd for over 40 years    Advance Care Planning: FULL  Consults: cardiology  Family Communication: none present  Severity of Illness: The appropriate patient status for this patient is OBSERVATION. Observation status is judged to be reasonable and necessary in order to provide the required intensity of service to ensure the patient's safety. The patient's presenting symptoms, physical exam findings, and initial radiographic and laboratory data in the context of their medical condition is felt to place them at decreased risk for further clinical deterioration. Furthermore, it is anticipated that the patient will be medically stable for discharge from the hospital within 2 midnights of admission.   Author: Alm Schneider, MD 05/01/2024 1:21 PM  For on call review [email or web address].

## 2024-05-01 NOTE — ED Triage Notes (Signed)
 Pt presents to ED with complaints of shortness of breath x couple days, diaphoresis. Pt currently receiving cancer meds for lung cancer.

## 2024-05-01 NOTE — ED Provider Notes (Signed)
 [place] EMERGENCY DEPARTMENT AT [facility] Provider Note   CSN: [id number] Arrival date & time: 05/01/24  9040     Patient presents with: Shortness of Breath   Jose Joseph is a 68 y.o. male.  {Add pertinent medical, surgical, social history, OB history to YEP:67052} Patient complains of shortness of breath.  He has history of lung cancer   Shortness of Breath      Prior to Admission medications   Medication Sig Start Date End Date Taking? Authorizing Provider  albuterol  (VENTOLIN  HFA) 108 (90 Base) MCG/ACT inhaler Inhale 1 puff into the lungs every 4 (four) hours as needed for wheezing. 01/02/24 01/01/25 Yes Tobie Yetta HERO, MD  binimetinib  (MEKTOVI ) 15 MG tablet Take 3 tablets (45 mg total) by mouth 2 (two) times daily. 04/12/24  Yes Kandala, Hyndavi, MD  budesonide -glycopyrrolate -formoterol  (BREZTRI  AEROSPHERE) 160-9-4.8 MCG/ACT AERO inhaler Inhale 2 puffs into the lungs in the morning and at bedtime. 01/02/24  Yes Tobie Yetta HERO, MD  cetirizine (ZYRTEC) 10 MG tablet Take 10 mg by mouth in the morning.   Yes [provider]  citalopram  (CELEXA ) 20 MG tablet Take 20 mg by mouth in the morning.   Yes [provider]  encorafenib  (BRAFTOVI ) 75 MG capsule Take 6 capsules (450 mg total) by mouth daily. Patient taking differently: Take 450 mg by mouth every evening. 04/12/24  Yes Davonna Siad, MD  EPINEPHrine  0.3 mg/0.3 mL IJ SOAJ injection Inject 0.3 mg into the muscle as needed for anaphylaxis. 02/14/24  Yes [provider]  metoprolol  tartrate (LOPRESSOR ) 50 MG tablet Take 1 tablet (50 mg total) by mouth 2 (two) times daily. 04/29/24  Yes Mallipeddi, Vishnu P, MD  prochlorperazine  (COMPAZINE ) 10 MG tablet TAKE 1 TABLET(10 MG) BY MOUTH EVERY 6 HOURS AS NEEDED 02/23/24  Yes Rogers Hai, MD  warfarin (COUMADIN ) 10 MG tablet Take 1 tablet (10 mg total) by mouth daily. Or as directed by coumadin  clinic Patient taking differently: Take 10 mg  by mouth See admin instructions. Hold on 09/17,09/18, and 09/19 Then on 09/20 take 1 tablet (10 mg),Monday 1/2 tablet (5mg ), Tuesday 1 tablet (10 mg), Wednesday 1/2 tablet (5 mg), Thursday 1 tablet (10 mg), Friday 1/2 tablet ( 5 mg), Saturday 1 tablet (10 mg), Sunday 1 tablet (10 mg) MON 09/29 return for next INR. 02/19/24  Yes Mallipeddi, Vishnu P, MD    Allergies: Bee venom    Review of Systems  Respiratory:  Positive for shortness of breath.     Updated Vital Signs BP (!) 85/68   Pulse 92   Temp 97.9 F (36.6 C) (Axillary)   Resp 17   Ht 6' 1 (1.854 m)   Wt 118.8 kg   SpO2 96%   BMI 34.57 kg/m   Physical Exam  (all labs ordered are listed, but only abnormal results are displayed) Labs Reviewed  COMPREHENSIVE METABOLIC PANEL WITH GFR - Abnormal; Notable for the following components:      Result Value   Chloride 96 (*)    CO2 21 (*)    Glucose, Bld 103 (*)    BUN 25 (*)    Creatinine, Ser 1.37 (*)    Total Protein 8.4 (*)    Albumin 3.3 (*)    GFR, Estimated 55 (*)    Anion gap 18 (*)    All other components within normal limits  LACTIC ACID, PLASMA - Abnormal; Notable for the following components:   Lactic Acid, Venous 2.5 (*)  All other components within normal limits  CBC WITH DIFFERENTIAL/PLATELET - Abnormal; Notable for the following components:   WBC 14.7 (*)    Neutro Abs 10.9 (*)    Monocytes Absolute 1.6 (*)    Eosinophils Absolute 0.8 (*)    Abs Immature Granulocytes 0.25 (*)    All other components within normal limits  PROTIME-INR - Abnormal; Notable for the following components:   Prothrombin Time 41.0 (*)    INR 4.0 (*)    All other components within normal limits  RESP PANEL BY RT-PCR (RSV, FLU A&B, COVID)  RVPGX2  CULTURE, BLOOD (ROUTINE X 2)  CULTURE, BLOOD (ROUTINE X 2)  LACTIC ACID, PLASMA  URINALYSIS, W/ REFLEX TO CULTURE (INFECTION SUSPECTED)  TROPONIN I (HIGH SENSITIVITY)  TROPONIN I (HIGH SENSITIVITY)    EKG: None  Radiology: DG  Chest Portable 1 View Result Date: 05/01/2024 CLINICAL DATA:  Sepsis. EXAM: PORTABLE CHEST 1 VIEW COMPARISON:  12/28/2023 FINDINGS: Low volume film. Cardiopericardial silhouette is at upper limits of normal for size. Focal opacity in the parahilar right lung has decreased in the interval with residual streaky linear density in this region, compatible with scarring as indicated on chest CT 03/11/2024. Right Port-A-Cath again noted. No acute bony abnormality. Telemetry leads overlie the chest. IMPRESSION: Low volume film without acute cardiopulmonary findings. Electronically Signed   By: Camellia Candle M.D.   On: 05/01/2024 11:26    {Document cardiac monitor, telemetry assessment procedure when appropriate:32947} Procedures   Medications Ordered in the ED  sodium chloride  0.9 % bolus 1,000 mL (0 mLs Intravenous Stopped 05/01/24 1106)  cefTRIAXone  (ROCEPHIN ) 2 g in sodium chloride  0.9 % 100 mL IVPB (0 g Intravenous Stopped 05/01/24 1151)  sodium chloride  0.9 % bolus 1,000 mL (0 mLs Intravenous Stopped 05/01/24 1151)  propofol  (DIPRIVAN ) 10 mg/mL bolus/IV push 59.4 mg (59.4 mg Intravenous Given 05/01/24 1203)     CRITICAL CARE Performed by: Fairy Sermon Total critical care time: 55 minutes Critical care time was exclusive of separately billable procedures and treating other patients. Critical care was necessary to treat or prevent imminent or life-threatening deterioration. Critical care was time spent personally by me on the following activities: development of treatment plan with patient and/or surrogate as well as nursing, discussions with consultants, evaluation of patient's response to treatment, examination of patient, obtaining history from patient or surrogate, ordering and performing treatments and interventions, ordering and review of laboratory studies, ordering and review of radiographic studies, pulse oximetry and re-evaluation of patient's condition.   Patient is seen by cardiology.  They  recommended cardioversion.  Patient was cardioverted at 200 J and was given propofol  {Click here for ABCD2, HEART and other calculators REFRESH Note before signing:1}                              Medical Decision Making Amount and/or Complexity of Data Reviewed Labs: ordered. Radiology: ordered. ECG/medicine tests: ordered.  Risk Decision regarding hospitalization.   Atrial fibs and hypotension patient admitted to medicine with cardiology following  {Document critical care time when appropriate  Document review of labs and clinical decision tools ie [id number], etc  Document your independent review of radiology images and any outside records  Document your discussion with family members, caretakers and with consultants  Document social determinants of health affecting pt's care  Document your decision making why or why not admission, treatments were needed:32947:::1}   Final diagnoses:  Atrial fibrillation, unspecified  type [facility])  Hypotensive episode    ED Discharge Orders     None

## 2024-05-01 NOTE — ED Notes (Signed)
 MD aware of hypotension.

## 2024-05-01 NOTE — Consult Note (Signed)
 Cardiology Consultation:   Patient ID: ZYIR GASSERT MRN: [id number]; DOB: 05-16-1956  Admit date: 05/01/2024 Date of Consult: 05/01/2024  Primary Care Provider: Sheryle Carwin, MD [facility] Cardiologist: Diannah SHAUNNA Maywood, MD  [facility] Electrophysiologist:  None    Patient Profile:   Jose Joseph is a 68 y.o. male with a hx of asthma, paroxysmal atrial fibrillation, COPD, depression, hypertension and metastatic right lung cancer.  He also has a history of CVA who is being seen today for the evaluation of recurrent atrial fibrillation with RVR at the request of Fairy Sermon, MD.     History of Present Illness:   Jose Joseph this is a 68 year old male with a history of asthma, COPD, PAF, depression, hypertension, metastatic lung cancer and CVA.  He was last seen by Dr. Bettyjo 01/18/2024 for hospital follow-up after an admission in May 2025 for dyspnea on exertion complicated by malignant bilateral pleural effusion status postthoracentesis positive for adeno CA, small to medium pericardial effusion and new onset A-fib with RVR.  At that time he was placed on a Cardizem  drip and eventually underwent TEE guided cardioversion.    He is currently followed by oncology for management of metastatic lung cancer.  His chemotherapy has multiple interactions including DOACs and Cardizem  and was admitted eventually placed on full dose subcu Lovenox  which was later changed to Coumadin  on 02/19/2024.  He has been followed in Coumadin  clinic.   He had a repeat 2D echo 04/11/2024 which revealed resolution of pericardial effusion, EF 55 to 60%, G1 DD and mild AR.  At last office visit he was continued on metoprolol  tartrate 50 mg twice daily and continued on Coumadin .  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  He has a lot of back pain due to sclerotic lesion in his central sacrum.  He was usual state of health until  recently when he started complaining of shortness of breath over the past few days along with diaphoresis.  He denied any chest pain.  Noted to have an elevated WBC at 14.7 on labs in the ER and code sepsis was called.  Lactic acid 2.5.  Noted on telemetry to be in A-fib with RVR to 120 bpm.  He was given 1 L of fluid with improvement in systolic blood pressure up to 99 mmHg.  He tells me that he has not had any chest pain but has had some shortness of breath.  His main complaint though is feeling very weak and worn out with some dizziness but no syncope.  He tells me that he has not been able to tell when he has been in A-fib in the past and was surprised he was back in A-fib.  Cardiology is now asked to consult for help with treatment of his atrial fibrillation.  Past Medical History:  Diagnosis Date   Arthritis    Asthma    Atrial fibrillation (HCC)    Cancer (HCC)    Complication of anesthesia    after his neck surgery pt. trying to get up and walk out   COPD (chronic obstructive pulmonary disease) (HCC)    Depression    History of kidney stones    2021   Hypertension    Metastatic lung carcinoma, right (HCC) 08/2023   Stroke [facility])     Past Surgical History:  Procedure Laterality Date   BRONCHIAL BIOPSY  11/21/2023  Procedure: BRONCHOSCOPY, WITH BIOPSY;  Surgeon: Shelah Lamar RAMAN, MD;  Location: [facility] ENDOSCOPY;  Service: Pulmonary;;   BRONCHIAL NEEDLE ASPIRATION BIOPSY  11/21/2023   Procedure: BRONCHOSCOPY, WITH NEEDLE ASPIRATION BIOPSY;  Surgeon: Shelah Lamar RAMAN, MD;  Location: [facility] ENDOSCOPY;  Service: Pulmonary;;   CARDIOVERSION N/A 01/01/2024   Procedure: CARDIOVERSION;  Surgeon: Francyne Headland, MD;  Location: [facility] INVASIVE CV LAB;  Service: Cardiovascular;  Laterality: N/A;   COLONOSCOPY WITH PROPOFOL  N/A 01/19/2023   Procedure: COLONOSCOPY WITH PROPOFOL ;  Surgeon: Shaaron Lamar HERO, MD;  Location: [facility] ENDO SUITE;  Service: Endoscopy;  Laterality: N/A;  200pm, asa 2   ENDOBRONCHIAL  ULTRASOUND Bilateral 11/21/2023   Procedure: ENDOBRONCHIAL ULTRASOUND (EBUS);  Surgeon: Shelah Lamar RAMAN, MD;  Location: [facility] ENDOSCOPY;  Service: Pulmonary;  Laterality: Bilateral;   HERNIA REPAIR     IR IMAGING GUIDED PORT INSERTION  12/14/2023   IR THORACENTESIS ASP PLEURAL SPACE W/IMG GUIDE  12/28/2023   KNEE ARTHROCENTESIS     NECK SURGERY     POLYPECTOMY  01/19/2023   Procedure: POLYPECTOMY;  Surgeon: Shaaron Lamar HERO, MD;  Location: [facility] ENDO SUITE;  Service: Endoscopy;;   TOTAL HIP ARTHROPLASTY Left 01/15/2021   Procedure: LEFT TOTAL HIP ARTHROPLASTY ANTERIOR APPROACH;  Surgeon: Barbarann Oneil BROCKS, MD;  Location: [facility] OR;  Service: Orthopedics;  Laterality: Left;   TRANSESOPHAGEAL ECHOCARDIOGRAM (CATH LAB) N/A 01/01/2024   Procedure: TRANSESOPHAGEAL ECHOCARDIOGRAM;  Surgeon: Francyne Headland, MD;  Location: [facility] INVASIVE CV LAB;  Service: Cardiovascular;  Laterality: N/A;   VIDEO BRONCHOSCOPY WITH ENDOBRONCHIAL NAVIGATION Bilateral 11/21/2023   Procedure: VIDEO BRONCHOSCOPY WITH ENDOBRONCHIAL NAVIGATION;  Surgeon: Shelah Lamar RAMAN, MD;  Location: [facility] ENDOSCOPY;  Service: Pulmonary;  Laterality: Bilateral;   WRIST ARTHROPLASTY       Home Medications:  Prior to Admission medications   Medication Sig Start Date End Date Taking? Authorizing Provider  albuterol  (VENTOLIN  HFA) 108 (90 Base) MCG/ACT inhaler Inhale 1 puff into the lungs every 4 (four) hours as needed for wheezing. 01/02/24 01/01/25 Yes Tobie Yetta HERO, MD  binimetinib  (MEKTOVI ) 15 MG tablet Take 3 tablets (45 mg total) by mouth 2 (two) times daily. 04/12/24  Yes Davonna Siad, MD  budesonide -glycopyrrolate -formoterol  (BREZTRI  AEROSPHERE) 160-9-4.8 MCG/ACT AERO inhaler Inhale 2 puffs into the lungs in the morning and at bedtime. 01/02/24  Yes Tobie Yetta HERO, MD  cetirizine (ZYRTEC) 10 MG tablet Take 10 mg by mouth in the morning.   Yes [provider]  citalopram  (CELEXA ) 20 MG tablet Take 20 mg by mouth in the morning.   Yes [provider]  encorafenib  (BRAFTOVI ) 75 MG capsule Take 6 capsules (450 mg total) by mouth daily. Patient taking differently: Take 450 mg by mouth every evening. 04/12/24  Yes Davonna Siad, MD  EPINEPHrine  0.3 mg/0.3 mL IJ SOAJ injection Inject 0.3 mg into the muscle as needed for anaphylaxis. 02/14/24  Yes [provider]  metoprolol  tartrate (LOPRESSOR ) 50 MG tablet Take 1 tablet (50 mg total) by mouth 2 (two) times daily. 04/29/24  Yes Mallipeddi, Vishnu P, MD  prochlorperazine  (COMPAZINE ) 10 MG tablet TAKE 1 TABLET(10 MG) BY MOUTH EVERY 6 HOURS AS NEEDED 02/23/24  Yes Rogers Hai, MD  warfarin (COUMADIN ) 10 MG tablet Take 1 tablet (10 mg total) by mouth daily. Or as directed by coumadin  clinic Patient taking differently: Take 10 mg by mouth See admin instructions. Hold on 09/17,09/18, and 09/19 Then on 09/20 take 1 tablet (10 mg),Monday 1/2 tablet (5mg ), Tuesday 1 tablet (10 mg), Wednesday 1/2  tablet (5 mg), Thursday 1 tablet (10 mg), Friday 1/2 tablet ( 5 mg), Saturday 1 tablet (10 mg), Sunday 1 tablet (10 mg) MON 09/29 return for next INR. 02/19/24  Yes Mallipeddi, Vishnu P, MD    Inpatient Medications: Scheduled Meds:   Continuous Infusions:  PRN Meds:   Allergies:    Allergies  Allergen Reactions   Bee Venom Anaphylaxis    syncope    Social History:   Social History   Socioeconomic History   Marital status: Married    Spouse name: Not on file   Number of children: 2   Years of education: Not on file   Highest education level: Not on file  Occupational History   Occupation: retired  Tobacco Use   Smoking status: Former    Current packs/day: 0.00    Average packs/day: 1 pack/day for 35.0 years (35.0 ttl pk-yrs)    Types: Cigarettes    Start date: 59    Quit date: 2014    Years since quitting: 11.7    Passive exposure: Past   Smokeless tobacco: Never  Vaping Use   Vaping status: Never Used  Substance and Sexual Activity   Alcohol use: Not  Currently    Alcohol/week: 2.0 standard drinks of alcohol    Types: 2 Cans of beer per week   Drug use: Yes    Types: Marijuana    Comment: last: 1 week ago   Sexual activity: Not on file  Other Topics Concern   Not on file  Social History Narrative   Not on file   Social Drivers of Health   Financial Resource Strain: Not on file  Food Insecurity: No Food Insecurity (12/27/2023)   Hunger Vital Sign    Worried About Running Out of Food in the Last Year: Never true    Ran Out of Food in the Last Year: Never true  Transportation Needs: No Transportation Needs (12/27/2023)   PRAPARE - Administrator, Civil Service (Medical): No    Lack of Transportation (Non-Medical): No  Physical Activity: Not on file  Stress: Not on file  Social Connections: Patient Declined (12/27/2023)   Social Connection and Isolation Panel    Frequency of Communication with Friends and Family: Patient declined    Frequency of Social Gatherings with Friends and Family: Patient declined    Attends Religious Services: Patient declined    Database administrator or Organizations: Patient declined    Attends Banker Meetings: Patient declined    Marital Status: Patient declined  Intimate Partner Violence: Not At Risk (12/27/2023)   Humiliation, Afraid, Rape, and Kick questionnaire    Fear of Current or Ex-Partner: No    Emotionally Abused: No    Physically Abused: No    Sexually Abused: No    Family History:    Family History  Problem Relation Age of Onset   Kidney cancer Sister    Prostate cancer Brother      ROS:  Please see the history of present illness.   All other ROS reviewed and negative.     Physical Exam/Data:   Vitals:   05/01/24 1103 05/01/24 1115 05/01/24 1203 05/01/24 1209  BP: 97/71 100/67 (!) 88/53 (!) 84/58  Pulse: 97 99 (!) 120 94  Resp: 19 (!) 26 (!) 26 (!) 23  Temp:   97.9 F (36.6 C) 97.9 F (36.6 C)  TempSrc:   Oral Axillary  SpO2: 95% 92% 93% 93%   Weight:  Height:       No intake or output data in the 24 hours ending 05/01/24 1211    05/01/2024   10:08 AM 03/21/2024    1:02 PM 02/29/2024    9:52 AM  Last 3 Weights  Weight (lbs) 262 lb 273 lb 9.5 oz 274 lb 11.1 oz  Weight (kg) 118.842 kg 124.1 kg 124.6 kg     Body mass index is 34.57 kg/m.  General:  Well nourished, well developed, in no acute distress HEENT: normal Lymph: no adenopathy Neck: no JVD Endocrine:  No thryomegaly Vascular: No carotid bruits; FA pulses 2+ bilaterally without bruits  Cardiac:  normal S1, S2; irregularly irregular and tachycardic; no rubs or gallops.  2/6 systolic murmur at the right upper sternal border to the left lower sternal border Lungs:  clear to auscultation bilaterally, no wheezing, rhonchi or rales  Abd: soft, nontender, no hepatomegaly  Ext: no edema Musculoskeletal:  No deformities, BUE and BLE strength normal and equal Skin: warm and dry  Neuro:  CNs 2-12 intact, no focal abnormalities noted Psych:  Normal affect   EKG:  The EKG was personally reviewed and demonstrates: Atrial fibrillation with RVR at 124 bpm with intermittent PVCs Telemetry:  Telemetry was personally reviewed and demonstrates: Atrial fibrillation with RVR to 100 520 bpm  Laboratory Data:  High Sensitivity Troponin:   Recent Labs  Lab 05/01/24 1020  TROPONINIHS 8     Chemistry Recent Labs  Lab 05/01/24 1020  NA 135  K 4.2  CL 96*  CO2 21*  GLUCOSE 103*  BUN 25*  CREATININE 1.37*  CALCIUM 10.2  GFRNONAA 55*  ANIONGAP 18*    Recent Labs  Lab 05/01/24 1020  PROT 8.4*  ALBUMIN 3.3*  AST 17  ALT 12  ALKPHOS 71  BILITOT 0.5   Hematology Recent Labs  Lab 05/01/24 1020  WBC 14.7*  RBC 5.07  HGB 15.2  HCT 46.5  MCV 91.7  MCH 30.0  MCHC 32.7  RDW 12.7  PLT 324   BNPNo results for input(s): BNP, PROBNP in the last 168 hours.  DDimer No results for input(s): DDIMER in the last 168 hours.   Radiology/Studies:  DG Chest  Portable 1 View Result Date: 05/01/2024 CLINICAL DATA:  Sepsis. EXAM: PORTABLE CHEST 1 VIEW COMPARISON:  12/28/2023 FINDINGS: Low volume film. Cardiopericardial silhouette is at upper limits of normal for size. Focal opacity in the parahilar right lung has decreased in the interval with residual streaky linear density in this region, compatible with scarring as indicated on chest CT 03/11/2024. Right Port-A-Cath again noted. No acute bony abnormality. Telemetry leads overlie the chest. IMPRESSION: Low volume film without acute cardiopulmonary findings. Electronically Signed   By: Camellia Candle M.D.   On: 05/01/2024 11:26     Assessment and Plan:   Atrial fibrillation with RVR He has a history of A-fib diagnosed in initially in May 2025 Status post TEE/DCCV May 2025 Has been on metoprolol  tartrate 50 mg twice daily for rate control Was found to have adeno CA of the lung metastatic to the bone and current chemotherapeutic regimen precludes use of DOAC or Cardizem /amiodarone and therefore was on full dose subcu Lovenox  until 02/19/2024 when he was transition to Coumadin  He has not missed any doses of Coumadin  and INRs have been therapeutic in Coumadin  clinic since 02/26/2024.  INR 03/25/2024 2.8, 8/27 2.5, 9/17 5.8. Suspect that he is A-fib with hypotension is the trigger for his shortness of breath and feeling poorly.  He has received 1 L of IV fluid and is on a second liter of IV fluid with systolic BP in the upper 90s Given his chemotherapeutic regimen cannot use amiodarone for rate control Hypotension precludes use of higher doses of beta-blocker for rate control His INR is in therapeutic and he has not had anything to eat since last night , I recommended that the ER physician Dr. Inocencio proceed with cardioversion.  No need for TEE since he has been therapeutic Repeat 2D echo to make sure no change in EF  Hypertension Currently blood pressure soft and due to A-fib with RVR He takes metoprolol   titrate 50 mg twice daily at home  Pericardial effusion He has a history of large pericardial effusion by echo 12/26/2023 but follow-up echo 12/30/2023 showed small effusion without any intervention Follow-up 2D echo 04/11/2024 showed no evidence of pericardial effusion   CHA2DS2-VASc Score = 5   This indicates a 7.2% annual risk of stroke. The patient's score is based upon: CHF History: 0 HTN History: 1 Diabetes History: 0 Stroke History: 2 Vascular Disease History: 1 Age Score: 1 Gender Score: 0      For questions or updates, please contact [facility] Please consult [email or web address] for contact info under    Signed, Wilbert Bihari, MD  05/01/2024 12:11 PM

## 2024-05-01 NOTE — ED Notes (Signed)
 Synchronized shock delivered at 200 jules.

## 2024-05-01 NOTE — Sepsis Progress Note (Signed)
 Code sepsis protocol being monitored by eLink.

## 2024-05-01 NOTE — Progress Notes (Signed)
 PHARMACY - ANTICOAGULATION CONSULT NOTE  Pharmacy Consult for warfarin Indication: atrial fibrillation  Allergies  Allergen Reactions   Bee Venom Anaphylaxis    syncope    Patient Measurements: Height: 6' 1 (185.4 cm) Weight: 118.8 kg (262 lb) IBW/kg (Calculated) : 79.9 HEPARIN  DW (KG): 105.6  Vital Signs: Temp: 97.9 F (36.6 C) (09/17 1209) Temp Source: Axillary (09/17 1209) BP: 112/71 (09/17 1345) Pulse Rate: 97 (09/17 1345)  Labs: Recent Labs    05/01/24 0925 05/01/24 1020 05/01/24 1231  HGB  --  15.2  --   HCT  --  46.5  --   PLT  --  324  --   LABPROT  --  41.0*  --   INR 5.8* 4.0*  --   CREATININE  --  1.37*  --   TROPONINIHS  --  8 6    Estimated Creatinine Clearance: 67.8 mL/min (A) (by C-G formula based on SCr of 1.37 mg/dL (H)).   Medical History: Past Medical History:  Diagnosis Date   Arthritis    Asthma    Atrial fibrillation (HCC)    Cancer (HCC)    Complication of anesthesia    after his neck surgery pt. trying to get up and walk out   COPD (chronic obstructive pulmonary disease) (HCC)    Depression    History of kidney stones    2021   Hypertension    Metastatic lung carcinoma, right (HCC) 08/2023   Stroke [facility])     Medications:  (Not in a hospital admission)   Assessment: Pharmacy consulted to dose warfarin in patient with atrial fibrillation.  Home dose was recently reduced to 5 mg MWF and 10 mg ROW at 9/17 Anti-coag visit due to INR of 5.8.  INR currently 4.0 in ER.  Goal of Therapy:  INR 2-3 Monitor platelets by anticoagulation protocol: Yes   Plan:  Hold warfarin x 1 dose. Monitor daily INR and s/s of bleeding.  Elspeth Sour, PharmD Clinical Pharmacist 05/01/2024 2:17 PM

## 2024-05-01 NOTE — Progress Notes (Signed)
*  PRELIMINARY RESULTS* Echocardiogram 2D Echocardiogram has been performed with Definity .  Teresa Aida PARAS 05/01/2024, 1:38 PM

## 2024-05-02 DIAGNOSIS — R6510 Systemic inflammatory response syndrome (SIRS) of non-infectious origin without acute organ dysfunction: Secondary | ICD-10-CM | POA: Diagnosis not present

## 2024-05-02 DIAGNOSIS — I3139 Other pericardial effusion (noninflammatory): Secondary | ICD-10-CM | POA: Diagnosis not present

## 2024-05-02 DIAGNOSIS — I10 Essential (primary) hypertension: Secondary | ICD-10-CM | POA: Diagnosis not present

## 2024-05-02 DIAGNOSIS — I959 Hypotension, unspecified: Secondary | ICD-10-CM | POA: Diagnosis not present

## 2024-05-02 DIAGNOSIS — D6869 Other thrombophilia: Secondary | ICD-10-CM | POA: Insufficient documentation

## 2024-05-02 DIAGNOSIS — R791 Abnormal coagulation profile: Secondary | ICD-10-CM | POA: Diagnosis not present

## 2024-05-02 DIAGNOSIS — I4891 Unspecified atrial fibrillation: Secondary | ICD-10-CM | POA: Diagnosis not present

## 2024-05-02 DIAGNOSIS — C3490 Malignant neoplasm of unspecified part of unspecified bronchus or lung: Secondary | ICD-10-CM | POA: Diagnosis not present

## 2024-05-02 LAB — MAGNESIUM: Magnesium: 1.8 mg/dL (ref 1.7–2.4)

## 2024-05-02 LAB — BASIC METABOLIC PANEL WITH GFR
Anion gap: 9 (ref 5–15)
BUN: 22 mg/dL (ref 8–23)
CO2: 26 mmol/L (ref 22–32)
Calcium: 8.9 mg/dL (ref 8.9–10.3)
Chloride: 102 mmol/L (ref 98–111)
Creatinine, Ser: 1.08 mg/dL (ref 0.61–1.24)
GFR, Estimated: >60 mL/min (ref >60)
Glucose, Bld: 105 mg/dL — ABNORMAL HIGH (ref 70–99)
Potassium: 3.9 mmol/L (ref 3.5–5.1)
Sodium: 137 mmol/L (ref 135–145)

## 2024-05-02 LAB — CBC
HCT: 35.7 % — ABNORMAL LOW (ref 39.0–52.0)
Hemoglobin: 11.7 g/dL — ABNORMAL LOW (ref 13.0–17.0)
MCH: 29.7 pg (ref 26.0–34.0)
MCHC: 32.8 g/dL (ref 30.0–36.0)
MCV: 90.6 fL (ref 80.0–100.0)
Platelets: 226 K/uL (ref 150–400)
RBC: 3.94 MIL/uL — ABNORMAL LOW (ref 4.22–5.81)
RDW: 12.7 % (ref 11.5–15.5)
WBC: 11.4 K/uL — ABNORMAL HIGH (ref 4.0–10.5)
nRBC: 0 % (ref 0.0–0.2)

## 2024-05-02 LAB — PROTIME-INR
INR: 5 (ref 0.8–1.2)
Prothrombin Time: 48.2 s — ABNORMAL HIGH (ref 11.4–15.2)

## 2024-05-02 LAB — MRSA NEXT GEN BY PCR, NASAL: MRSA by PCR Next Gen: NOT DETECTED

## 2024-05-02 MED ORDER — ONDANSETRON HCL 4 MG/2ML IJ SOLN: 4.0000 mg | Freq: Four times a day (QID) | INTRAMUSCULAR | Status: DC | PRN

## 2024-05-02 MED ORDER — VANCOMYCIN HCL 1750 MG/350ML IV SOLN
1750.0000 mg | INTRAVENOUS | Status: DC
  Administered 2024-05-02: 1750 mg via INTRAVENOUS
  Filled 2024-05-02: qty 350

## 2024-05-02 MED ORDER — POTASSIUM CHLORIDE CRYS ER 20 MEQ PO TBCR
20.0000 meq | EXTENDED_RELEASE_TABLET | Freq: Once | ORAL | Status: AC
  Administered 2024-05-02: 20 meq via ORAL
  Filled 2024-05-02: qty 1

## 2024-05-02 MED ORDER — MAGNESIUM SULFATE 2 GM/50ML IV SOLN
2.0000 g | Freq: Once | INTRAVENOUS | Status: AC
  Administered 2024-05-02: 2 g via INTRAVENOUS
  Filled 2024-05-02: qty 50

## 2024-05-02 NOTE — Progress Notes (Signed)
 PHARMACY - ANTICOAGULATION CONSULT NOTE  Pharmacy Consult for warfarin Indication: atrial fibrillation  Allergies  Allergen Reactions   Bee Venom Anaphylaxis    syncope    Patient Measurements: Height: 6' 1 (185.4 cm) Weight: 120.8 kg (266 lb 5.1 oz) IBW/kg (Calculated) : 79.9 HEPARIN  DW (KG): 106.2  Vital Signs: Temp: 98.5 F (36.9 C) (09/18 0804) Temp Source: Oral (09/18 0804) BP: 138/83 (09/18 0800) Pulse Rate: 67 (09/18 0700)  Labs: Recent Labs    05/01/24 0925 05/01/24 1020 05/01/24 1231 05/02/24 0325  HGB  --  15.2  --  11.7*  HCT  --  46.5  --  35.7*  PLT  --  324  --  226  LABPROT  --  41.0*  --  48.2*  INR 5.8* 4.0*  --  5.0*  CREATININE  --  1.37*  --  1.08  TROPONINIHS  --  8 6  --     Estimated Creatinine Clearance: 86.7 mL/min (by C-G formula based on SCr of 1.08 mg/dL).   Medical History: Past Medical History:  Diagnosis Date   Arthritis    Asthma    Atrial fibrillation (HCC)    Cancer (HCC)    Complication of anesthesia    after his neck surgery pt. trying to get up and walk out   COPD (chronic obstructive pulmonary disease) (HCC)    Depression    History of kidney stones    2021   Hypertension    Metastatic lung carcinoma, right (HCC) 08/2023   Stroke (HCC)     Medications:  Medications Prior to Admission  Medication Sig Dispense Refill Last Dose/Taking   albuterol  (VENTOLIN  HFA) 108 (90 Base) MCG/ACT inhaler Inhale 1 puff into the lungs every 4 (four) hours as needed for wheezing. 18 g 1 Unknown   binimetinib  (MEKTOVI ) 15 MG tablet Take 3 tablets (45 mg total) by mouth 2 (two) times daily. 180 tablet 0 04/30/2024 Evening   budesonide -glycopyrrolate -formoterol  (BREZTRI  AEROSPHERE) 160-9-4.8 MCG/ACT AERO inhaler Inhale 2 puffs into the lungs in the morning and at bedtime. 10.7 g 1 05/01/2024 Morning   cetirizine (ZYRTEC) 10 MG tablet Take 10 mg by mouth in the morning.   04/30/2024 Morning   citalopram  (CELEXA ) 20 MG tablet Take 20 mg  by mouth in the morning.   04/30/2024   encorafenib  (BRAFTOVI ) 75 MG capsule Take 6 capsules (450 mg total) by mouth daily. (Patient taking differently: Take 450 mg by mouth every evening.) 180 capsule 0 04/30/2024 Evening   EPINEPHrine  0.3 mg/0.3 mL IJ SOAJ injection Inject 0.3 mg into the muscle as needed for anaphylaxis.   Unknown   metoprolol  tartrate (LOPRESSOR ) 50 MG tablet Take 1 tablet (50 mg total) by mouth 2 (two) times daily. 180 tablet 2 05/01/2024 Morning   prochlorperazine  (COMPAZINE ) 10 MG tablet TAKE 1 TABLET(10 MG) BY MOUTH EVERY 6 HOURS AS NEEDED 60 tablet 2 04/30/2024 Evening   warfarin (COUMADIN ) 10 MG tablet Take 1 tablet (10 mg total) by mouth daily. Or as directed by coumadin  clinic (Patient taking differently: Take 10 mg by mouth See admin instructions. Hold on 09/17,09/18, and 09/19 Then on 09/20 take 1 tablet (10 mg),Monday 1/2 tablet (5mg ), Tuesday 1 tablet (10 mg), Wednesday 1/2 tablet (5 mg), Thursday 1 tablet (10 mg), Friday 1/2 tablet ( 5 mg), Saturday 1 tablet (10 mg), Sunday 1 tablet (10 mg) MON 09/29 return for next INR.) 30 tablet 3 04/30/2024 at  8:00 PM    Assessment: Pharmacy consulted to dose  warfarin in patient with atrial fibrillation.  Home dose was recently reduced to 5 mg MWF and 10 mg ROW at 9/17 Anti-coag visit due to INR of 5.8.  INR currently 4.0 in ER.  INR 4.0 > 5.0  Goal of Therapy:  INR 2-3 Monitor platelets by anticoagulation protocol: Yes   Plan:  Hold warfarin x 1 dose. Monitor daily INR and s/s of bleeding.  Elspeth Sour, PharmD Clinical Pharmacist 05/02/2024 8:38 AM

## 2024-05-02 NOTE — Progress Notes (Signed)
 Critical Labs Documentation   05/02/24 0600  Provider Notification  Provider Name/Title Adefeso MD  Date Provider Notified 05/02/24  Time Provider Notified 0602  Method of Notification Page (Secure chat)  Notification Reason Critical Result  Test performed and critical result INR 5.0  Date Critical Result Received 05/02/24  Time Critical Result Received 0600  Provider response Other (Comment) (Awaiting orders)

## 2024-05-02 NOTE — Progress Notes (Signed)
   05/02/24 1332  TOC Brief Assessment  Insurance and Status Reviewed  Patient has primary care physician Yes  Home environment has been reviewed Home  Prior level of function: With Spouse  Prior/Current Home Services No current home services  Social Drivers of Health Review SDOH reviewed no interventions necessary  Readmission risk has been reviewed Yes  Transition of care needs no transition of care needs at this time   Transition of Care Department [facility]) has reviewed patient and no TOC needs have been identified at this time. We will continue to monitor patient advancement through interdisciplinary progression rounds. If new patient transition needs arise, please place a TOC consult.

## 2024-05-02 NOTE — Progress Notes (Signed)
 PROGRESS NOTE  Jose Joseph FMW:[id number] DOB: Dec 14, 1955 DOA: 05/01/2024 PCP: Sheryle Carwin, MD  Brief History:  68 year old male with a history of persistent atrial fibrillation status post DCCV May 2025, metastatic lung adenocarcinoma, COPD, hypertension, stroke, depression presenting with 2 to 3-day history of shortness of breath, generalized weakness, and dizziness.  Patient states that he was taking his blood pressure at home, systolic blood pressure was running in 80s.  He states that it felt like he was not able to catch a full breath.  He denied any fever, chills, chest pain, nausea, vomiting or diarrhea, abdominal pain.  He complains of a chronic cough.  He denies any hemoptysis.  He denies any hematochezia or melena.  He denies any dysuria or hematuria. Currently, patient is following medical oncology for his metastatic lung cancer. Due to interactions with chemotherapy, DOAC's are not preferred.  In addition, amiodarone and diltiazem  cannot be used due to drug-drug interactions.  He continues on warfarin.  In the ED, the patient was afebrile but tachycardic into 120s.  His BPs were soft with SBP in the 80s. WBC 14.7, hemoglobin 15.2, platelets 324.  Sodium 135, potassium 4.2, bicarbonate 21, serum creatinine 1.37.  Lactic acid 2.5.  Troponin 8.  INR 4.0.  Chest x-ray was negative for pulmonary edema or consolidation.  EKG showed atrial fibrillation with nonspecific ST changes. The patient was cardioverted in the emergency department under recommendation of cardiology.  He was admitted for further evaluation and treatment of his SIRS   Assessment/Plan: SIRS/Hypotension - Patient presented with tachycardia and leukocytosis - Follow blood cultures - Obtain UA--6-10 WBC - Chest x-ray without consolidation - continue empiric IV cefepime /vanc   Persistent atrial fibrillation with RVR - Status post DCCV in the emergency department 05/01/24 - Continue metoprolol  - Continue  warfarin - 05/01/24 echo EF 55-60, no WMA, G1DD, no pericardial effusion - 9/18--remains in sinus  Supratherapeutic INR -9/18 INR = 5.0 -since pt is hemodynamically stable without signs of active bleed, allow to trend down for now   Pericardial effusion - 04/03/2024 echo EF 55 to 60%, no WMA, no pericardial effusion noted, trivial MR - 05/01/24 echo EF 55-60, no WMA, G1DD, no pericardial effusion   Stage IV adenocarcinoma of the lung - 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 -Braftovi  (Encorafenib ) 450 mg daily and Mektovi  (binimetinib ) 45 mg twice daily started on 12/28/2023    Hypotension - Continue IV fluids>>improved - Obtain UA 6-10 WBC   Lactic acidosis - Continue IV fluids>>improved -lactic 2.5>>1.5   Class 1 obesity - BMI 34.57 - Lifestyle modification   COPD -stable on room air -Quit tobacco use 10 years ago of 1-2 ppd for over 40 years      Family Communication:  no Family at bedside  Consultants:  cardiology  Code Status:  FULL   DVT Prophylaxis:  warfarin   Procedures: As Listed in Progress Note Above  Antibiotics: Vanc 9/17>> Cefepime  9/17>>      Subjective: Patient denies fevers, chills, headache, chest pain, dyspnea, nausea, vomiting, diarrhea, abdominal pain, dysuria, hematuria, hematochezia, and melena.   Objective: Vitals:   05/02/24 0700 05/02/24 0800 05/02/24 0804 05/02/24 0817  BP: (!) 178/97 138/83    Pulse: 67     Resp: (!) 32 17    Temp:   98.5 F (36.9 C)   TempSrc:   Oral   SpO2: 92%   92%  Weight:      Height:  Intake/Output Summary (Last 24 hours) at 05/02/2024 0819 Last data filed at 05/02/2024 0720 Gross per 24 hour  Intake 1215.53 ml  Output 2200 ml  Net -984.47 ml   Weight change:  Exam:  General:  Pt is alert, follows commands appropriately, not in acute distress HEENT: No icterus, No thrush, No neck mass, [place]/AT Cardiovascular: RRR, S1/S2, no rubs, no gallops Respiratory: fine bibasilar rales.  No wheeze Abdomen:  Soft/+BS, non tender, non distended, no guarding Extremities: No edema, No lymphangitis, No petechiae, No rashes, no synovitis   Data Reviewed: I have personally reviewed following labs and imaging studies Basic Metabolic Panel: Recent Labs  Lab 05/01/24 1020 05/02/24 0325  NA 135 137  K 4.2 3.9  CL 96* 102  CO2 21* 26  GLUCOSE 103* 105*  BUN 25* 22  CREATININE 1.37* 1.08  CALCIUM 10.2 8.9  MG  --  1.8   Liver Function Tests: Recent Labs  Lab 05/01/24 1020  AST 17  ALT 12  ALKPHOS 71  BILITOT 0.5  PROT 8.4*  ALBUMIN 3.3*   No results for input(s): LIPASE, AMYLASE in the last 168 hours. No results for input(s): AMMONIA in the last 168 hours. Coagulation Profile: Recent Labs  Lab 05/01/24 0925 05/01/24 1020 05/02/24 0325  INR 5.8* 4.0* 5.0*   CBC: Recent Labs  Lab 05/01/24 1020 05/02/24 0325  WBC 14.7* 11.4*  NEUTROABS 10.9*  --   HGB 15.2 11.7*  HCT 46.5 35.7*  MCV 91.7 90.6  PLT 324 226   Cardiac Enzymes: No results for input(s): CKTOTAL, CKMB, CKMBINDEX, TROPONINI in the last 168 hours. BNP: Invalid input(s): POCBNP CBG: No results for input(s): GLUCAP in the last 168 hours. HbA1C: No results for input(s): HGBA1C in the last 72 hours. Urine analysis:    Component Value Date/Time   COLORURINE YELLOW 05/01/2024 1020   APPEARANCEUR CLEAR 05/01/2024 1020   APPEARANCEUR Clear 04/10/2020 1334   LABSPEC 1.015 05/01/2024 1020   PHURINE 5.0 05/01/2024 1020   GLUCOSEU NEGATIVE 05/01/2024 1020   HGBUR SMALL (A) 05/01/2024 1020   BILIRUBINUR NEGATIVE 05/01/2024 1020   BILIRUBINUR Negative 04/10/2020 1334   KETONESUR NEGATIVE 05/01/2024 1020   PROTEINUR NEGATIVE 05/01/2024 1020   NITRITE NEGATIVE 05/01/2024 1020   LEUKOCYTESUR NEGATIVE 05/01/2024 1020   Sepsis Labs: @LABRCNTIP (procalcitonin:4,lacticidven:4) ) Recent Results (from the past 240 hours)  Culture, blood (Routine x 2)     Status: None (Preliminary result)    Collection Time: 05/01/24 10:20 AM   Specimen: BLOOD  Result Value Ref Range Status   Specimen Description BLOOD RIGHT ANTECUBITAL  Final   Special Requests   Final    BOTTLES DRAWN AEROBIC AND ANAEROBIC Blood Culture adequate volume   Culture   Final    NO GROWTH < 24 HOURS Performed at [facility], [address] [place], [place] [address]    Report Status PENDING  Incomplete  Resp panel by RT-PCR (RSV, Flu A&B, Covid) Anterior Nasal Swab     Status: None   Collection Time: 05/01/24 10:20 AM   Specimen: Anterior Nasal Swab  Result Value Ref Range Status   SARS Coronavirus 2 by RT PCR NEGATIVE NEGATIVE Final    Comment: (NOTE) SARS-CoV-2 target nucleic acids are NOT DETECTED.  The SARS-CoV-2 RNA is generally detectable in upper respiratory specimens during the acute phase of infection. The lowest concentration of SARS-CoV-2 viral copies this assay can detect is 138 copies/mL. A negative result does not preclude SARS-Cov-2 infection and should not be used  as the sole basis for treatment or other patient management decisions. A negative result may occur with  improper specimen collection/handling, submission of specimen other than nasopharyngeal swab, presence of viral mutation(s) within the areas targeted by this assay, and inadequate number of viral copies(<138 copies/mL). A negative result must be combined with clinical observations, patient history, and epidemiological information. The expected result is Negative.  Fact Sheet for Patients:  BloggerCourse.com  Fact Sheet for Healthcare Providers:  SeriousBroker.it  This test is no t yet approved or cleared by the [place]  FDA and  has been authorized for detection and/or diagnosis of SARS-CoV-2 by FDA under an Emergency Use Authorization (EUA). This EUA will remain  in effect (meaning this test can be used) for the duration of the COVID-19 declaration under Section  564(b)(1) of the Act, 21 U.S.C.section 360bbb-3(b)(1), unless the authorization is terminated  or revoked sooner.       Influenza A by PCR NEGATIVE NEGATIVE Final   Influenza B by PCR NEGATIVE NEGATIVE Final    Comment: (NOTE) The Xpert Xpress SARS-CoV-2/FLU/RSV plus assay is intended as an aid in the diagnosis of influenza from Nasopharyngeal swab specimens and should not be used as a sole basis for treatment. Nasal washings and aspirates are unacceptable for Xpert Xpress SARS-CoV-2/FLU/RSV testing.  Fact Sheet for Patients: BloggerCourse.com  Fact Sheet for Healthcare Providers: SeriousBroker.it  This test is not yet approved or cleared by the [place]  FDA and has been authorized for detection and/or diagnosis of SARS-CoV-2 by FDA under an Emergency Use Authorization (EUA). This EUA will remain in effect (meaning this test can be used) for the duration of the COVID-19 declaration under Section 564(b)(1) of the Act, 21 U.S.C. section 360bbb-3(b)(1), unless the authorization is terminated or revoked.     Resp Syncytial Virus by PCR NEGATIVE NEGATIVE Final    Comment: (NOTE) Fact Sheet for Patients: BloggerCourse.com  Fact Sheet for Healthcare Providers: SeriousBroker.it  This test is not yet approved or cleared by the [place]  FDA and has been authorized for detection and/or diagnosis of SARS-CoV-2 by FDA under an Emergency Use Authorization (EUA). This EUA will remain in effect (meaning this test can be used) for the duration of the COVID-19 declaration under Section 564(b)(1) of the Act, 21 U.S.C. section 360bbb-3(b)(1), unless the authorization is terminated or revoked.  Performed at [facility], [address] [place], [place] [address]   Culture, blood (Routine x 2)     Status: None (Preliminary result)   Collection Time: 05/01/24 10:49 AM   Specimen:  BLOOD  Result Value Ref Range Status   Specimen Description BLOOD BLOOD LEFT ARM  Final   Special Requests   Final    BOTTLES DRAWN AEROBIC AND ANAEROBIC Blood Culture adequate volume   Culture   Final    NO GROWTH < 24 HOURS Performed at [facility], [address] [place], [place] [address]    Report Status PENDING  Incomplete  MRSA Next Gen by PCR, Nasal     Status: None   Collection Time: 05/01/24  2:29 PM   Specimen: Nasal Mucosa; Nasal Swab  Result Value Ref Range Status   MRSA by PCR Next Gen NOT DETECTED NOT DETECTED Final    Comment: (NOTE) The GeneXpert MRSA Assay (FDA approved for NASAL specimens only), is one component of a comprehensive MRSA colonization surveillance program. It is not intended to diagnose MRSA infection nor to guide or monitor treatment for MRSA infections. Test performance is not FDA  approved in patients less than 75 years old. Performed at [facility], [address] [place], [place] [address]      Scheduled Meds:  budesonide -glycopyrrolate -formoterol   2 puff Inhalation BID   Chlorhexidine  Gluconate Cloth  6 each Topical Q0600   citalopram   20 mg Oral Daily   feeding supplement  237 mL Oral BID BM   loratadine   10 mg Oral Daily   metoprolol  tartrate  50 mg Oral BID   potassium chloride   20 mEq Oral Once   Continuous Infusions:  ceFEPime  (MAXIPIME ) IV Stopped (05/02/24 9371)   magnesium  sulfate bolus IVPB     vancomycin       Procedures/Studies: ECHOCARDIOGRAM COMPLETE Result Date: 05/01/2024    ECHOCARDIOGRAM REPORT   Patient Name:   FERGUSON GERTNER Date of Exam: 05/01/2024 Medical Rec #:  [id number]     Height:       73.0 in Accession #:    [id number]    Weight:       262.0 lb Date of Birth:  Jan 28, 1956     BSA:          2.413 m Patient Age:    68 years      BP:           98/64 mmHg Patient Gender: M             HR:           92 bpm. Exam Location:  [facility] Procedure: 2D Echo, Cardiac Doppler, Color Doppler and Intracardiac             Opacification Agent (Both Spectral and Color Flow Doppler were            utilized during procedure). STAT ECHO Indications:    Atrial Fibrillation l48.91  History:        Patient has prior history of Echocardiogram examinations, most                 recent 04/11/2024. Stroke, Arrythmias:Atrial Fibrillation; Risk                 Factors:Former Smoker and Hypertension. Lung cancer metastatic                 to bone [facility]),                 Chronic obstructive pulmonary disease (HCC).  Sonographer:    Aida Pizza RCS Referring Phys: [phone] TRACI R TURNER IMPRESSIONS  1. Left ventricular ejection fraction, by estimation, is 55 to 60%. The left ventricle has normal function. The left ventricle has no regional wall motion abnormalities. Left ventricular diastolic parameters are consistent with Grade I diastolic dysfunction (impaired relaxation).  2. Right ventricular systolic function is normal. The right ventricular size is normal.  3. The mitral valve is normal in structure. No evidence of mitral valve regurgitation. No evidence of mitral stenosis.  4. The aortic valve is tricuspid. There is mild calcification of the aortic valve. There is mild thickening of the aortic valve. Aortic valve regurgitation is not visualized. Aortic valve sclerosis/calcification is present, without any evidence of aortic stenosis. FINDINGS  Left Ventricle: Left ventricular ejection fraction, by estimation, is 55 to 60%. The left ventricle has normal function. The left ventricle has no regional wall motion abnormalities. The left ventricular internal cavity size was normal in size. There is  no left ventricular hypertrophy. Left ventricular diastolic parameters are consistent with Grade I diastolic dysfunction (impaired relaxation). Normal left ventricular filling  pressure. Right Ventricle: The right ventricular size is normal. No increase in right ventricular wall thickness. Right ventricular systolic function is normal. Left Atrium: Left  atrial size was normal in size. Right Atrium: Right atrial size was normal in size. Pericardium: There is no evidence of pericardial effusion. Mitral Valve: The mitral valve is normal in structure. No evidence of mitral valve regurgitation. No evidence of mitral valve stenosis. Tricuspid Valve: The tricuspid valve is normal in structure. Tricuspid valve regurgitation is not demonstrated. No evidence of tricuspid stenosis. Aortic Valve: The aortic valve is tricuspid. There is mild calcification of the aortic valve. There is mild thickening of the aortic valve. Aortic valve regurgitation is not visualized. Aortic valve sclerosis/calcification is present, without any evidence of aortic stenosis. Pulmonic Valve: The pulmonic valve was normal in structure. Pulmonic valve regurgitation is not visualized. No evidence of pulmonic stenosis. Aorta: The aortic root is normal in size and structure. IAS/Shunts: The interatrial septum appears to be lipomatous. No atrial level shunt detected by color flow Doppler.  LEFT VENTRICLE PLAX 2D LVIDd:         5.10 cm   Diastology LVIDs:         3.50 cm   LV e' medial:    7.96 cm/s LV PW:         1.00 cm   LV E/e' medial:  5.8 LV IVS:        0.90 cm   LV e' lateral:   11.30 cm/s LVOT diam:     2.10 cm   LV E/e' lateral: 4.1 LV SV:         59 LV SV Index:   24 LVOT Area:     3.46 cm  RIGHT VENTRICLE RV S prime:     13.40 cm/s TAPSE (M-mode): 2.3 cm LEFT ATRIUM             Index        RIGHT ATRIUM           Index LA diam:        3.70 cm 1.53 cm/m   RA Area:     16.00 cm LA Vol (A2C):   71.3 ml 29.54 ml/m  RA Volume:   41.60 ml  17.24 ml/m LA Vol (A4C):   51.5 ml 21.34 ml/m LA Biplane Vol: 61.1 ml 25.32 ml/m  AORTIC VALVE LVOT Vmax:   97.33 cm/s LVOT Vmean:  63.433 cm/s LVOT VTI:    0.169 m  AORTA Ao Root diam: 3.70 cm MITRAL VALVE MV Area (PHT): 4.49 cm    SHUNTS MV Decel Time: 169 msec    Systemic VTI:  0.17 m MV E velocity: 46.30 cm/s  Systemic Diam: 2.10 cm MV A velocity: 66.90  cm/s MV E/A ratio:  0.69 Wilbert Bihari MD Electronically signed by Wilbert Bihari MD Signature Date/Time: 05/01/2024/2:07:26 PM    Final    DG Chest Portable 1 View Result Date: 05/01/2024 CLINICAL DATA:  Sepsis. EXAM: PORTABLE CHEST 1 VIEW COMPARISON:  12/28/2023 FINDINGS: Low volume film. Cardiopericardial silhouette is at upper limits of normal for size. Focal opacity in the parahilar right lung has decreased in the interval with residual streaky linear density in this region, compatible with scarring as indicated on chest CT 03/11/2024. Right Port-A-Cath again noted. No acute bony abnormality. Telemetry leads overlie the chest. IMPRESSION: Low volume film without acute cardiopulmonary findings. Electronically Signed   By: Camellia Candle M.D.   On: 05/01/2024 11:26   ECHOCARDIOGRAM COMPLETE Result  Date: 04/11/2024    ECHOCARDIOGRAM REPORT   Patient Name:   DUTCH ING Date of Exam: 04/11/2024 Medical Rec #:  [id number]     Height:       73.0 in Accession #:    [id number]    Weight:       273.6 lb Date of Birth:  [birth year]     BSA:          2.458 m Patient Age:    68 years      BP:           91/66 mmHg Patient Gender: M             HR:           70 bpm. [facility]am Location[facility] Procedure: 2D Echo, 3D Echo, Cardiac Doppler, Color Doppler and Strain Analysis            (Both Spectral and Color Flow Doppler were utilized during            procedure). Indications:    Chemo  History:        Patient has prior history of Echocardiogram examinations, most                 recent 01/26/2024. COPD, Arrythmias:Atrial Fibrillation;                 Signs/Symptoms:Dyspnea.  Sonographer:    Philomena Daring Referring Phys: [id number] ALEAN STANDS  Sonographer Comments: Global longitudinal strain was attempted. IMPRESSIONS  1. Left ventricular ejection fraction, by estimation, is 55 to 60%. The left ventricle has normal function. The left ventricle has no regional wall motion abnormalities. Left ventricular diastolic parameters  are consistent with Grade I diastolic dysfunction (impaired relaxation).  2. Right ventricular systolic function is normal. The right ventricular size is normal. Tricuspid regurgitation signal is inadequate for assessing PA pressure.  3. The mitral valve is normal in structure. Trivial mitral valve regurgitation. No evidence of mitral stenosis.  4. The aortic valve is tricuspid. There is mild calcification of the aortic valve. There is mild thickening of the aortic valve. Aortic valve regurgitation is mild. No aortic stenosis is present.  5. The inferior vena cava is normal in size with greater than 50% respiratory variability, suggesting right atrial pressure of 3 mmHg. FINDINGS  Left Ventricle: Left ventricular ejection fraction, by estimation, is 55 to 60%. The left ventricle has normal function. The left ventricle has no regional wall motion abnormalities. Strain was performed and the global longitudinal strain is indeterminate. The left ventricular internal cavity size was normal in size. There is no left ventricular hypertrophy. Left ventricular diastolic parameters are consistent with Grade I diastolic dysfunction (impaired relaxation). Normal left ventricular filling pressure. Right Ventricle: The right ventricular size is normal. Right vetricular wall thickness was not well visualized. Right ventricular systolic function is normal. Tricuspid regurgitation signal is inadequate for assessing PA pressure. Left Atrium: Left atrial size was normal in size. Right Atrium: Right atrial size was normal in size. Pericardium: There is no evidence of pericardial effusion. Mitral Valve: The mitral valve is normal in structure. Trivial mitral valve regurgitation. No evidence of mitral valve stenosis. Tricuspid Valve: The tricuspid valve is normal in structure. Tricuspid valve regurgitation is trivial. No evidence of tricuspid stenosis. Aortic Valve: The aortic valve is tricuspid. There is mild calcification of the aortic  valve. There is mild thickening of the aortic valve. There is mild aortic valve annular calcification. Aortic valve regurgitation  is mild. Aortic regurgitation PHT measures 673 msec. No aortic stenosis is present. Aortic valve mean gradient measures 4.0 mmHg. Aortic valve peak gradient measures 7.4 mmHg. Aortic valve area, by VTI measures 3.32 cm. Pulmonic Valve: The pulmonic valve was not well visualized. Pulmonic valve regurgitation is not visualized. No evidence of pulmonic stenosis. Aorta: The aortic root and ascending aorta are structurally normal, with no evidence of dilitation. Venous: The inferior vena cava is normal in size with greater than 50% respiratory variability, suggesting right atrial pressure of 3 mmHg. IAS/Shunts: No atrial level shunt detected by color flow Doppler.  LEFT VENTRICLE PLAX 2D LVIDd:         5.40 cm   Diastology LVIDs:         3.90 cm   LV e' medial:    7.51 cm/s LV PW:         0.80 cm   LV E/e' medial:  4.7 LV IVS:        0.80 cm   LV e' lateral:   7.18 cm/s LVOT diam:     2.30 cm   LV E/e' lateral: 4.9 LV SV:         81 LV SV Index:   33 LVOT Area:     4.15 cm                           3D Volume EF:                          3D EF:        51 %                          LV EDV:       152 ml                          LV ESV:       75 ml                          LV SV:        77 ml RIGHT VENTRICLE             IVC RV Basal diam:  2.40 cm     IVC diam: 1.30 cm RV Mid diam:    1.70 cm RV S prime:     10.40 cm/s TAPSE (M-mode): 1.7 cm LEFT ATRIUM             Index        RIGHT ATRIUM           Index LA diam:        4.00 cm 1.63 cm/m   RA Area:     14.60 cm LA Vol (A2C):   56.4 ml 22.94 ml/m  RA Volume:   26.20 ml  10.66 ml/m LA Vol (A4C):   44.4 ml 18.06 ml/m LA Biplane Vol: 49.9 ml 20.30 ml/m  AORTIC VALVE AV Area (Vmax):    3.02 cm AV Area (Vmean):   2.94 cm AV Area (VTI):     3.32 cm AV Vmax:           136.36 cm/s AV Vmean:          93.072 cm/s AV VTI:  0.243 m AV  Peak Grad:      7.4 mmHg AV Mean Grad:      4.0 mmHg LVOT Vmax:         99.10 cm/s LVOT Vmean:        65.900 cm/s LVOT VTI:          0.194 m LVOT/AV VTI ratio: 0.80 AI PHT:            673 msec  AORTA Ao Root diam: 3.30 cm Ao Asc diam:  3.20 cm MITRAL VALVE MV Area (PHT): 2.69 cm    SHUNTS MV Decel Time: 282 msec    Systemic VTI:  0.19 m MV E velocity: 35.00 cm/s  Systemic Diam: 2.30 cm MV A velocity: 60.30 cm/s MV E/A ratio:  0.58 Dorn Ross MD Electronically signed by Dorn Ross MD Signature Date/Time: 04/11/2024/12:01:34 PM    Final     Alm Schneider, DO  [facility]  If 7PM-7AM, please contact night-coverage [email or web address] Password [email or web address] 05/02/2024, 8:19 AM   LOS: 1 day

## 2024-05-02 NOTE — Progress Notes (Signed)
 Rounding Note   Patient Name: Jose Joseph Date of Encounter: 05/02/2024  [facility] Cardiologist: Vishnu P Mallipeddi, MD   Subjective Some low back pains this morning.   Scheduled Meds:  budesonide -glycopyrrolate -formoterol   2 puff Inhalation BID   Chlorhexidine  Gluconate Cloth  6 each Topical Q0600   citalopram   20 mg Oral Daily   feeding supplement  237 mL Oral BID BM   loratadine   10 mg Oral Daily   metoprolol  tartrate  50 mg Oral BID   Continuous Infusions:  ceFEPime  (MAXIPIME ) IV Stopped (05/02/24 9371)   vancomycin      PRN Meds: acetaminophen , ondansetron  (ZOFRAN ) IV, oxyCODONE    Vital Signs  Vitals:   05/02/24 0600 05/02/24 0700 05/02/24 0800 05/02/24 0804  BP: (!) 149/88 (!) 178/97 138/83   Pulse: 98 67    Resp: 18 (!) 32 17   Temp:    98.5 F (36.9 C)  TempSrc:    Oral  SpO2: 95% 92%    Weight:      Height:        Intake/Output Summary (Last 24 hours) at 05/02/2024 0816 Last data filed at 05/02/2024 0720 Gross per 24 hour  Intake 1215.53 ml  Output 2200 ml  Net -984.47 ml      05/01/2024    2:23 PM 05/01/2024   10:08 AM 03/21/2024    1:02 PM  Last 3 Weights  Weight (lbs) 266 lb 5.1 oz 262 lb 273 lb 9.5 oz  Weight (kg) 120.8 kg 118.842 kg 124.1 kg      Telemetry Sinus tach - Personally Reviewed  ECG  N/a - Personally Reviewed  Physical Exam  GEN: No acute distress.   Neck: No JVD Cardiac: RRR, no murmurs, rubs, or gallops.  Respiratory: Clear to auscultation bilaterally. GI: Soft, nontender, non-distended  MS: No edema; No deformity. Neuro:  Nonfocal  Psych: Normal affect   Labs High Sensitivity Troponin:   Recent Labs  Lab 05/01/24 1020 05/01/24 1231  TROPONINIHS 8 6     Chemistry Recent Labs  Lab 05/01/24 1020 05/02/24 0325  NA 135 137  K 4.2 3.9  CL 96* 102  CO2 21* 26  GLUCOSE 103* 105*  BUN 25* 22  CREATININE 1.37* 1.08  CALCIUM 10.2 8.9  MG  --  1.8  PROT 8.4*  --   ALBUMIN 3.3*  --   AST 17  --    ALT 12  --   ALKPHOS 71  --   BILITOT 0.5  --   GFRNONAA 55* >60  ANIONGAP 18* 9    Lipids No results for input(s): CHOL, TRIG, HDL, LABVLDL, LDLCALC, CHOLHDL in the last 168 hours.  Hematology Recent Labs  Lab 05/01/24 1020 05/02/24 0325  WBC 14.7* 11.4*  RBC 5.07 3.94*  HGB 15.2 11.7*  HCT 46.5 35.7*  MCV 91.7 90.6  MCH 30.0 29.7  MCHC 32.7 32.8  RDW 12.7 12.7  PLT 324 226   Thyroid No results for input(s): TSH, FREET4 in the last 168 hours.  BNPNo results for input(s): BNP, PROBNP in the last 168 hours.  DDimer No results for input(s): DDIMER in the last 168 hours.   Radiology  ECHOCARDIOGRAM COMPLETE Result Date: 05/01/2024    ECHOCARDIOGRAM REPORT   Patient Name:   SAATHVIK EVERY Date of Exam: 05/01/2024 Medical Rec #:  [id number]     Height:       73.0 in Accession #:    [id number]    Weight:  262.0 lb Date of Birth:  05-May-1956     BSA:          2.413 m Patient Age:    68 years      BP:           98/64 mmHg Patient Gender: M             HR:           92 bpm. Exam Location:  [facility] Procedure: 2D Echo, Cardiac Doppler, Color Doppler and Intracardiac            Opacification Agent (Both Spectral and Color Flow Doppler were            utilized during procedure). STAT ECHO Indications:    Atrial Fibrillation l48.91  History:        Patient has prior history of Echocardiogram examinations, most                 recent 04/11/2024. Stroke, Arrythmias:Atrial Fibrillation; Risk                 Factors:Former Smoker and Hypertension. Lung cancer metastatic                 to bone [facility]),                 Chronic obstructive pulmonary disease (HCC).  Sonographer:    Aida Pizza RCS Referring Phys: [id number] TRACI R TURNER IMPRESSIONS  1. Left ventricular ejection fraction, by estimation, is 55 to 60%. The left ventricle has normal function. The left ventricle has no regional wall motion abnormalities. Left ventricular diastolic parameters are consistent with Grade I  diastolic dysfunction (impaired relaxation).  2. Right ventricular systolic function is normal. The right ventricular size is normal.  3. The mitral valve is normal in structure. No evidence of mitral valve regurgitation. No evidence of mitral stenosis.  4. The aortic valve is tricuspid. There is mild calcification of the aortic valve. There is mild thickening of the aortic valve. Aortic valve regurgitation is not visualized. Aortic valve sclerosis/calcification is present, without any evidence of aortic stenosis. FINDINGS  Left Ventricle: Left ventricular ejection fraction, by estimation, is 55 to 60%. The left ventricle has normal function. The left ventricle has no regional wall motion abnormalities. The left ventricular internal cavity size was normal in size. There is  no left ventricular hypertrophy. Left ventricular diastolic parameters are consistent with Grade I diastolic dysfunction (impaired relaxation). Normal left ventricular filling pressure. Right Ventricle: The right ventricular size is normal. No increase in right ventricular wall thickness. Right ventricular systolic function is normal. Left Atrium: Left atrial size was normal in size. Right Atrium: Right atrial size was normal in size. Pericardium: There is no evidence of pericardial effusion. Mitral Valve: The mitral valve is normal in structure. No evidence of mitral valve regurgitation. No evidence of mitral valve stenosis. Tricuspid Valve: The tricuspid valve is normal in structure. Tricuspid valve regurgitation is not demonstrated. No evidence of tricuspid stenosis. Aortic Valve: The aortic valve is tricuspid. There is mild calcification of the aortic valve. There is mild thickening of the aortic valve. Aortic valve regurgitation is not visualized. Aortic valve sclerosis/calcification is present, without any evidence of aortic stenosis. Pulmonic Valve: The pulmonic valve was normal in structure. Pulmonic valve regurgitation is not visualized.  No evidence of pulmonic stenosis. Aorta: The aortic root is normal in size and structure. IAS/Shunts: The interatrial septum appears to be lipomatous. No atrial level shunt  detected by color flow Doppler.  LEFT VENTRICLE PLAX 2D LVIDd:         5.10 cm   Diastology LVIDs:         3.50 cm   LV e' medial:    7.96 cm/s LV PW:         1.00 cm   LV E/e' medial:  5.8 LV IVS:        0.90 cm   LV e' lateral:   11.30 cm/s LVOT diam:     2.10 cm   LV E/e' lateral: 4.1 LV SV:         59 LV SV Index:   24 LVOT Area:     3.46 cm  RIGHT VENTRICLE RV S prime:     13.40 cm/s TAPSE (M-mode): 2.3 cm LEFT ATRIUM             Index        RIGHT ATRIUM           Index LA diam:        3.70 cm 1.53 cm/m   RA Area:     16.00 cm LA Vol (A2C):   71.3 ml 29.54 ml/m  RA Volume:   41.60 ml  17.24 ml/m LA Vol (A4C):   51.5 ml 21.34 ml/m LA Biplane Vol: 61.1 ml 25.32 ml/m  AORTIC VALVE LVOT Vmax:   97.33 cm/s LVOT Vmean:  63.433 cm/s LVOT VTI:    0.169 m  AORTA Ao Root diam: 3.70 cm MITRAL VALVE MV Area (PHT): 4.49 cm    SHUNTS MV Decel Time: 169 msec    Systemic VTI:  0.17 m MV E velocity: 46.30 cm/s  Systemic Diam: 2.10 cm MV A velocity: 66.90 cm/s MV E/A ratio:  0.69 Wilbert Bihari MD Electronically signed by Wilbert Bihari MD Signature Date/Time: 05/01/2024/2:07:26 PM    Final    DG Chest Portable 1 View Result Date: 05/01/2024 CLINICAL DATA:  Sepsis. EXAM: PORTABLE CHEST 1 VIEW COMPARISON:  12/28/2023 FINDINGS: Low volume film. Cardiopericardial silhouette is at upper limits of normal for size. Focal opacity in the parahilar right lung has decreased in the interval with residual streaky linear density in this region, compatible with scarring as indicated on chest CT 03/11/2024. Right Port-A-Cath again noted. No acute bony abnormality. Telemetry leads overlie the chest. IMPRESSION: Low volume film without acute cardiopulmonary findings. Electronically Signed   By: Camellia Candle M.D.   On: 05/01/2024 11:26    Cardiac  Studies   Patient Profile   AURON TADROS is a 68 y.o. male with a hx of asthma, paroxysmal atrial fibrillation, COPD, depression, hypertension and metastatic right lung cancer.  He also has a history of CVA who is being seen today for the evaluation of recurrent atrial fibrillation with RVR at the request of Fairy Sermon, MD.   Assessment & Plan  1.PAF - initially diagnosed 12/2023, at that time had TEE/DCCV - from notes chemo regimen for lung CA precludes the use of DOACs or cardizem /amiodarone.  -presented with afib RVR, complicated by low bp - INRs had been therapeutic, was cardioverted in ER by ER staff - currently on lopressor  50mg  bid - tele shows maintaing SR, sinus tach in setting of SIRs/systemic illness.     2.Metastatic lung cancer   3. SIRS - elevated WBC, lactic acid.  - per primary team, he is on abx.  - bp's much improved today  4. COPD    [facility] will sign off.  The patient is not ready for discharge from a medical standpoint. Ok from cardiac standpoint.  Medication Recommendations:  coumadin , metoprolol  50mg  bid Other recommendations (labs, testing, etc):  none Follow up as an outpatient:  We will arrange.  For questions or updates, please contact [facility] Please consult [email or web address] for contact info under       Signed, Alvan Carrier, MD  05/02/2024, 8:16 AM

## 2024-05-03 DIAGNOSIS — I959 Hypotension, unspecified: Secondary | ICD-10-CM | POA: Diagnosis not present

## 2024-05-03 DIAGNOSIS — R6510 Systemic inflammatory response syndrome (SIRS) of non-infectious origin without acute organ dysfunction: Secondary | ICD-10-CM | POA: Diagnosis not present

## 2024-05-03 DIAGNOSIS — I480 Paroxysmal atrial fibrillation: Secondary | ICD-10-CM

## 2024-05-03 DIAGNOSIS — R791 Abnormal coagulation profile: Secondary | ICD-10-CM | POA: Diagnosis not present

## 2024-05-03 DIAGNOSIS — C3490 Malignant neoplasm of unspecified part of unspecified bronchus or lung: Secondary | ICD-10-CM | POA: Diagnosis not present

## 2024-05-03 LAB — CBC
HCT: 37.3 % — ABNORMAL LOW (ref 39.0–52.0)
Hemoglobin: 12.3 g/dL — ABNORMAL LOW (ref 13.0–17.0)
MCH: 29.9 pg (ref 26.0–34.0)
MCHC: 33 g/dL (ref 30.0–36.0)
MCV: 90.5 fL (ref 80.0–100.0)
Platelets: 218 K/uL (ref 150–400)
RBC: 4.12 MIL/uL — ABNORMAL LOW (ref 4.22–5.81)
RDW: 12.7 % (ref 11.5–15.5)
WBC: 12.2 K/uL — ABNORMAL HIGH (ref 4.0–10.5)
nRBC: 0 % (ref 0.0–0.2)

## 2024-05-03 LAB — BASIC METABOLIC PANEL WITH GFR
Anion gap: 8 (ref 5–15)
BUN: 20 mg/dL (ref 8–23)
CO2: 23 mmol/L (ref 22–32)
Calcium: 8.8 mg/dL — ABNORMAL LOW (ref 8.9–10.3)
Chloride: 105 mmol/L (ref 98–111)
Creatinine, Ser: 0.93 mg/dL (ref 0.61–1.24)
GFR, Estimated: >60 mL/min (ref >60)
Glucose, Bld: 109 mg/dL — ABNORMAL HIGH (ref 70–99)
Potassium: 3.7 mmol/L (ref 3.5–5.1)
Sodium: 136 mmol/L (ref 135–145)

## 2024-05-03 LAB — PROTIME-INR
INR: 2.8 — ABNORMAL HIGH (ref 0.8–1.2)
Prothrombin Time: 30.9 s — ABNORMAL HIGH (ref 11.4–15.2)

## 2024-05-03 LAB — MAGNESIUM: Magnesium: 2 mg/dL (ref 1.7–2.4)

## 2024-05-03 MED ORDER — WARFARIN - PHARMACIST DOSING INPATIENT: Freq: Every day | Status: DC

## 2024-05-03 MED ORDER — WARFARIN SODIUM 5 MG PO TABS: 5.0000 mg | ORAL_TABLET | Freq: Once | ORAL | Status: DC

## 2024-05-03 MED ORDER — WARFARIN SODIUM 10 MG PO TABS: ORAL_TABLET | ORAL | Status: DC

## 2024-05-03 MED ORDER — HEPARIN SOD (PORK) LOCK FLUSH 100 UNIT/ML IV SOLN
500.0000 [IU] | Freq: Once | INTRAVENOUS | Status: AC
  Administered 2024-05-03: 500 [IU] via INTRAVENOUS
  Filled 2024-05-03: qty 5

## 2024-05-03 NOTE — TOC Initial Note (Signed)
 Transition of Care [facility]) - Initial/Assessment Note    Patient Details  Name: Jose Joseph MRN: [id number] Date of Birth: [birth year]  [facility]) CM/SW Contact:    Lucie Lunger, LCSWA Phone Number: 05/03/2024, 10:27 AM  Clinical Narrative:                 Pt is high risk for readmission. CSW spoke with pt and spouse at bedside to complete assessment. Pt state she is independent in completing ADLs and able to drive to appointments when needed. Pt has not had HH. Pt states he has a quad cane to use when and if needed. TOC to follow.   Expected Discharge Plan: Home/Self Care Barriers to Discharge: No Barriers Identified   Patient Goals and CMS Choice Patient states their goals for this hospitalization and ongoing recovery are:: return home CMS Medicare.gov Compare Post Acute Care list provided to:: Patient Choice offered to / list presented to : Patient      Expected Discharge Plan and Services In-house Referral: Clinical Social Work Discharge Planning Services: CM Consult   Living arrangements for the past 2 months: Single Family Home                                      Prior Living Arrangements/Services Living arrangements for the past 2 months: Single Family Home Lives with:: Spouse Patient language and need for interpreter reviewed:: Yes Do you feel safe going back to the place where you live?: Yes      Need for Family Participation in Patient Care: Yes (Comment) Care giver support system in place?: Yes (comment) Current home services: DME (quad cane) Criminal Activity/Legal Involvement Pertinent to Current Situation/Hospitalization: No - Comment as needed  Activities of Daily Living   ADL Screening (condition at time of admission) Independently performs ADLs?: Yes (appropriate for developmental age) Is the patient deaf or have difficulty hearing?: No Does the patient have difficulty seeing, even when wearing glasses/contacts?: No Does the  patient have difficulty concentrating, remembering, or making decisions?: No  Permission Sought/Granted                  Emotional Assessment Appearance:: Appears stated age Attitude/Demeanor/Rapport: Engaged Affect (typically observed): Accepting Orientation: : Oriented to Self, Oriented to Place, Oriented to  Time, Oriented to Situation Alcohol / Substance Use: Not Applicable Psych Involvement: No (comment)  Admission diagnosis:  SIRS (systemic inflammatory response syndrome) (HCC) [R65.10] Hypotensive episode [I95.9] Atrial fibrillation, unspecified type [facility]) [I48.91] Patient Active Problem List   Diagnosis Date Noted   Acquired thrombophilia (HCC) 05/02/2024   Supratherapeutic INR 05/02/2024   Primary hypertension 05/01/2024   SIRS (systemic inflammatory response syndrome) (HCC) 05/01/2024   Persistent atrial fibrillation with RVR (HCC) 05/01/2024   Hypotensive episode 05/01/2024   Encounter for therapeutic drug monitoring 02/19/2024   Long term current use of anticoagulant 01/18/2024   Persistent atrial fibrillation (HCC) 01/01/2024   PAF (paroxysmal atrial fibrillation) (HCC) 12/28/2023   Atrial fibrillation with RVR (HCC) 12/27/2023   Malignant neoplasm of lung (HCC) 12/27/2023   Dyspnea on exertion 12/26/2023   Pericardial effusion 12/26/2023   Hypertensive urgency 12/26/2023   Lung cancer metastatic to bone (HCC) 12/26/2023   Former smoker 12/26/2023   History of stroke 12/26/2023   Chronic obstructive pulmonary disease (HCC) 12/26/2023   COPD with acute exacerbation (HCC) 12/15/2023   Malignant neoplasm of upper lobe of  right lung (HCC) 11/30/2023   Pulmonary nodules 11/21/2023   Mediastinal adenopathy 11/21/2023   Status post total replacement of left hip 01/15/2021   Elevated PSA 04/10/2020   Pain in left hip 02/26/2020   Pain in right shoulder 02/26/2020   PCP:  Sheryle Carwin, MD Pharmacy:   [facility] [phone] - [place], [place] - [address]  AT [facility] [address] [place] [address] Phone: [phone] Fax: [phone]     Social Drivers of Health (SDOH) Social History: SDOH Screenings   Food Insecurity: No Food Insecurity (05/01/2024)  Housing: Low Risk  (05/01/2024)  Transportation Needs: No Transportation Needs (05/01/2024)  Utilities: Not At Risk (05/01/2024)  Depression (PHQ2-9): Low Risk  (03/21/2024)  Social Connections: Moderately Isolated (05/01/2024)  Tobacco Use: Medium Risk (05/01/2024)   SDOH Interventions:     Readmission Risk Interventions    05/03/2024   10:26 AM  Readmission Risk Prevention Plan  Transportation Screening Complete  HRI or Home Care Consult Complete  Social Work Consult for Recovery Care Planning/Counseling Complete  Palliative Care Screening Not Applicable  Medication Review Oceanographer) Complete

## 2024-05-03 NOTE — Discharge Summary (Signed)
 Physician Discharge Summary   Patient: Jose Joseph MRN: [id number] DOB: 1956/07/10  Admit date:     05/01/2024  Discharge date: 05/03/24  Discharge Physician: Alm Jenalyn Girdner   PCP: Sheryle Carwin, MD   Recommendations at discharge:   Please follow up with primary care provider within 1-2 weeks  Please repeat BMP and CBC in one week     Hospital Course: 68 year old male with a history of persistent atrial fibrillation status post DCCV May 2025, metastatic lung adenocarcinoma, COPD, hypertension, stroke, depression presenting with 2 to 3-day history of shortness of breath, generalized weakness, and dizziness.  Patient states that he was taking his blood pressure at home, systolic blood pressure was running in 80s.  He states that it felt like he was not able to catch a full breath.  He denied any fever, chills, chest pain, nausea, vomiting or diarrhea, abdominal pain.  He complains of a chronic cough.  He denies any hemoptysis.  He denies any hematochezia or melena.  He denies any dysuria or hematuria. Currently, patient is following medical oncology for his metastatic lung cancer. Due to interactions with chemotherapy, DOAC's are not preferred.  In addition, amiodarone and diltiazem  cannot be used due to drug-drug interactions.  He continues on warfarin.  In the ED, the patient was afebrile but tachycardic into 120s.  His BPs were soft with SBP in the 80s. WBC 14.7, hemoglobin 15.2, platelets 324.  Sodium 135, potassium 4.2, bicarbonate 21, serum creatinine 1.37.  Lactic acid 2.5.  Troponin 8.  INR 4.0.  Chest x-ray was negative for pulmonary edema or consolidation.  EKG showed atrial fibrillation with nonspecific ST changes. The patient was cardioverted in the emergency department under recommendation of cardiology.  He was admitted for further evaluation and treatment of his SIRS  Assessment and Plan: SIRS/Hypotension - Patient presented with tachycardia and leukocytosis - Follow blood  cultures--neg on day of dc - Obtain UA--6-10 WBC - Chest x-ray without consolidation - continue empiric IV cefepime /vanc - BP improved, no further hypotension after IVF and conversion to sinus   Persistent atrial fibrillation with RVR - Status post DCCV in the emergency department 05/01/24 - Continue metoprolol  - Continue warfarin - 05/01/24 echo EF 55-60, no WMA, G1DD, no pericardial effusion - 9/18 and 9/19--remains in sinus   Supratherapeutic INR -9/18 INR = 5.0 -since pt is hemodynamically stable without signs of active bleed, allow to trend down for now - INR 2.8 on day of d/c - discussed with PharmD--d/c home with 5 mg MWF and 10 mg all other days and get INR on 05/06/24--pt expressed understanding   Pericardial effusion - 04/03/2024 echo EF 55 to 60%, no WMA, no pericardial effusion noted, trivial MR - 05/01/24 echo EF 55-60, no WMA, G1DD, no pericardial effusion   Stage IV adenocarcinoma of the lung - 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 -Braftovi  (Encorafenib ) 450 mg daily and Mektovi  (binimetinib ) 45 mg twice daily started on 12/28/2023    Hypotension - Continue IV fluids>>improved - Obtain UA 6-10 WBC   Lactic acidosis - Continue IV fluids>>improved -lactic 2.5>>1.5   Class 1 obesity - BMI 34.57 - Lifestyle modification   COPD -stable on room air -Quit tobacco use 10 years ago of 1-2 ppd for over 40 years     Consultants: cardiology Procedures performed: none  Disposition: Home Diet recommendation:  Cardiac diet DISCHARGE MEDICATION: Allergies as of 05/03/2024       Reactions   Bee Venom Anaphylaxis   syncope  Medication List     TAKE these medications    Braftovi  75 MG capsule Generic drug: encorafenib  Take 6 capsules (450 mg total) by mouth daily. What changed: when to take this   Breztri  Aerosphere 160-9-4.8 MCG/ACT Aero inhaler Generic drug: budesonide -glycopyrrolate -formoterol  Inhale 2 puffs into the lungs in the morning and at bedtime.    cetirizine 10 MG tablet Commonly known as: ZYRTEC Take 10 mg by mouth in the morning.   citalopram  20 MG tablet Commonly known as: CELEXA  Take 20 mg by mouth in the morning.   EPINEPHrine  0.3 mg/0.3 mL Soaj injection Commonly known as: EPI-PEN Inject 0.3 mg into the muscle as needed for anaphylaxis.   Mektovi  15 MG tablet Generic drug: binimetinib  Take 3 tablets (45 mg total) by mouth 2 (two) times daily.   metoprolol  tartrate 50 MG tablet Commonly known as: LOPRESSOR  Take 1 tablet (50 mg total) by mouth 2 (two) times daily.   prochlorperazine  10 MG tablet Commonly known as: COMPAZINE  TAKE 1 TABLET(10 MG) BY MOUTH EVERY 6 HOURS AS NEEDED   Ventolin  HFA 108 (90 Base) MCG/ACT inhaler Generic drug: albuterol  Inhale 1 puff into the lungs every 4 (four) hours as needed for wheezing.   warfarin 10 MG tablet Commonly known as: COUMADIN  Take as directed. If you are unsure how to take this medication, talk to your nurse or doctor. Original instructions: Or as directed by coumadin  clinic.  Take 5 mg MWF and 10 mg all other days What changed:  how much to take how to take this when to take this additional instructions        Follow-up Information     Miriam Norris, NP Follow up on 05/20/2024.   Specialty: Cardiology Why: Hospital Follow-up on 05/20/2024 at 1:30 PM. Contact information: [address] [facility] [place] [address] [phone]                Discharge Exam: [name]   05/01/24 1008 05/01/24 1423  Weight: 118.8 kg 120.8 kg  HEENT:  [place]/AT, No thrush, no icterus CV:  RRR, no rub, no S3, no S4 Lung:  CTA, no wheeze, no rhonchi Abd:  soft/+BS, NT Ext:  No edema, no lymphangitis, no synovitis, no rash   Condition at discharge: stable  The results of significant diagnostics from this hospitalization (including imaging, microbiology, ancillary and laboratory) are listed below for reference.   Imaging Studies: ECHOCARDIOGRAM COMPLETE Result  Date: 05/01/2024    ECHOCARDIOGRAM REPORT   Patient Name:   Jose Joseph Date of Exam: 05/01/2024 Medical Rec #:  [id number]     Height:       73.0 in Accession #:    [id number]    Weight:       262.0 lb Date of Birth:  [birth year]     BSA:          2.413 m Patient Age:    68 years      BP:           98/64 mmHg Patient Gender: M             HR:           92 bpm. Exam Location:  [facility] Procedure: 2D Echo, Cardiac Doppler, Color Doppler and Intracardiac            Opacification Agent (Both Spectral and Color Flow Doppler were            utilized during procedure). STAT ECHO Indications:  Atrial Fibrillation l48.91  History:        Patient has prior history of Echocardiogram examinations, most                 recent 04/11/2024. Stroke, Arrythmias:Atrial Fibrillation; Risk                 Factors:Former Smoker and Hypertension. Lung cancer metastatic                 to bone [facility]),                 Chronic obstructive pulmonary disease (HCC).  Sonographer:    Aida Pizza RCS Referring Phys: [phone] TRACI R TURNER IMPRESSIONS  1. Left ventricular ejection fraction, by estimation, is 55 to 60%. The left ventricle has normal function. The left ventricle has no regional wall motion abnormalities. Left ventricular diastolic parameters are consistent with Grade I diastolic dysfunction (impaired relaxation).  2. Right ventricular systolic function is normal. The right ventricular size is normal.  3. The mitral valve is normal in structure. No evidence of mitral valve regurgitation. No evidence of mitral stenosis.  4. The aortic valve is tricuspid. There is mild calcification of the aortic valve. There is mild thickening of the aortic valve. Aortic valve regurgitation is not visualized. Aortic valve sclerosis/calcification is present, without any evidence of aortic stenosis. FINDINGS  Left Ventricle: Left ventricular ejection fraction, by estimation, is 55 to 60%. The left ventricle has normal function. The left ventricle has  no regional wall motion abnormalities. The left ventricular internal cavity size was normal in size. There is  no left ventricular hypertrophy. Left ventricular diastolic parameters are consistent with Grade I diastolic dysfunction (impaired relaxation). Normal left ventricular filling pressure. Right Ventricle: The right ventricular size is normal. No increase in right ventricular wall thickness. Right ventricular systolic function is normal. Left Atrium: Left atrial size was normal in size. Right Atrium: Right atrial size was normal in size. Pericardium: There is no evidence of pericardial effusion. Mitral Valve: The mitral valve is normal in structure. No evidence of mitral valve regurgitation. No evidence of mitral valve stenosis. Tricuspid Valve: The tricuspid valve is normal in structure. Tricuspid valve regurgitation is not demonstrated. No evidence of tricuspid stenosis. Aortic Valve: The aortic valve is tricuspid. There is mild calcification of the aortic valve. There is mild thickening of the aortic valve. Aortic valve regurgitation is not visualized. Aortic valve sclerosis/calcification is present, without any evidence of aortic stenosis. Pulmonic Valve: The pulmonic valve was normal in structure. Pulmonic valve regurgitation is not visualized. No evidence of pulmonic stenosis. Aorta: The aortic root is normal in size and structure. IAS/Shunts: The interatrial septum appears to be lipomatous. No atrial level shunt detected by color flow Doppler.  LEFT VENTRICLE PLAX 2D LVIDd:         5.10 cm   Diastology LVIDs:         3.50 cm   LV e' medial:    7.96 cm/s LV PW:         1.00 cm   LV E/e' medial:  5.8 LV IVS:        0.90 cm   LV e' lateral:   11.30 cm/s LVOT diam:     2.10 cm   LV E/e' lateral: 4.1 LV SV:         59 LV SV Index:   24 LVOT Area:     3.46 cm  RIGHT VENTRICLE RV S prime:  13.40 cm/s TAPSE (M-mode): 2.3 cm LEFT ATRIUM             Index        RIGHT ATRIUM           Index LA diam:         3.70 cm 1.53 cm/m   RA Area:     16.00 cm LA Vol (A2C):   71.3 ml 29.54 ml/m  RA Volume:   41.60 ml  17.24 ml/m LA Vol (A4C):   51.5 ml 21.34 ml/m LA Biplane Vol: 61.1 ml 25.32 ml/m  AORTIC VALVE LVOT Vmax:   97.33 cm/s LVOT Vmean:  63.433 cm/s LVOT VTI:    0.169 m  AORTA Ao Root diam: 3.70 cm MITRAL VALVE MV Area (PHT): 4.49 cm    SHUNTS MV Decel Time: 169 msec    Systemic VTI:  0.17 m MV E velocity: 46.30 cm/s  Systemic Diam: 2.10 cm MV A velocity: 66.90 cm/s MV E/A ratio:  0.69 Wilbert Bihari MD Electronically signed by Wilbert Bihari MD Signature Date/Time: 05/01/2024/2:07:26 PM    Final    DG Chest Portable 1 View Result Date: 05/01/2024 CLINICAL DATA:  Sepsis. EXAM: PORTABLE CHEST 1 VIEW COMPARISON:  12/28/2023 FINDINGS: Low volume film. Cardiopericardial silhouette is at upper limits of normal for size. Focal opacity in the parahilar right lung has decreased in the interval with residual streaky linear density in this region, compatible with scarring as indicated on chest CT 03/11/2024. Right Port-A-Cath again noted. No acute bony abnormality. Telemetry leads overlie the chest. IMPRESSION: Low volume film without acute cardiopulmonary findings. Electronically Signed   By: Camellia Candle M.D.   On: 05/01/2024 11:26   ECHOCARDIOGRAM COMPLETE Result Date: 04/11/2024    ECHOCARDIOGRAM REPORT   Patient Name:   Jose Joseph Date of Exam: 04/11/2024 Medical Rec #:  [id number]     Height:       73.0 in Accession #:    [id number]    Weight:       273.6 lb Date of Birth:  1956-08-01     BSA:          2.458 m Patient Age:    68 years      BP:           91/66 mmHg Patient Gender: M             HR:           70 bpm. Exam Location:  [facility] Procedure: 2D Echo, 3D Echo, Cardiac Doppler, Color Doppler and Strain Analysis            (Both Spectral and Color Flow Doppler were utilized during            procedure). Indications:    Chemo  History:        Patient has prior history of Echocardiogram examinations, most                  recent 01/26/2024. COPD, Arrythmias:Atrial Fibrillation;                 Signs/Symptoms:Dyspnea.  Sonographer:    Philomena Daring Referring Phys: [id number] ALEAN STANDS  Sonographer Comments: Global longitudinal strain was attempted. IMPRESSIONS  1. Left ventricular ejection fraction, by estimation, is 55 to 60%. The left ventricle has normal function. The left ventricle has no regional wall motion abnormalities. Left ventricular diastolic parameters are consistent with Grade I diastolic dysfunction (impaired relaxation).  2. Right ventricular  systolic function is normal. The right ventricular size is normal. Tricuspid regurgitation signal is inadequate for assessing PA pressure.  3. The mitral valve is normal in structure. Trivial mitral valve regurgitation. No evidence of mitral stenosis.  4. The aortic valve is tricuspid. There is mild calcification of the aortic valve. There is mild thickening of the aortic valve. Aortic valve regurgitation is mild. No aortic stenosis is present.  5. The inferior vena cava is normal in size with greater than 50% respiratory variability, suggesting right atrial pressure of 3 mmHg. FINDINGS  Left Ventricle: Left ventricular ejection fraction, by estimation, is 55 to 60%. The left ventricle has normal function. The left ventricle has no regional wall motion abnormalities. Strain was performed and the global longitudinal strain is indeterminate. The left ventricular internal cavity size was normal in size. There is no left ventricular hypertrophy. Left ventricular diastolic parameters are consistent with Grade I diastolic dysfunction (impaired relaxation). Normal left ventricular filling pressure. Right Ventricle: The right ventricular size is normal. Right vetricular wall thickness was not well visualized. Right ventricular systolic function is normal. Tricuspid regurgitation signal is inadequate for assessing PA pressure. Left Atrium: Left atrial size was normal in  size. Right Atrium: Right atrial size was normal in size. Pericardium: There is no evidence of pericardial effusion. Mitral Valve: The mitral valve is normal in structure. Trivial mitral valve regurgitation. No evidence of mitral valve stenosis. Tricuspid Valve: The tricuspid valve is normal in structure. Tricuspid valve regurgitation is trivial. No evidence of tricuspid stenosis. Aortic Valve: The aortic valve is tricuspid. There is mild calcification of the aortic valve. There is mild thickening of the aortic valve. There is mild aortic valve annular calcification. Aortic valve regurgitation is mild. Aortic regurgitation PHT measures 673 msec. No aortic stenosis is present. Aortic valve mean gradient measures 4.0 mmHg. Aortic valve peak gradient measures 7.4 mmHg. Aortic valve area, by VTI measures 3.32 cm. Pulmonic Valve: The pulmonic valve was not well visualized. Pulmonic valve regurgitation is not visualized. No evidence of pulmonic stenosis. Aorta: The aortic root and ascending aorta are structurally normal, with no evidence of dilitation. Venous: The inferior vena cava is normal in size with greater than 50% respiratory variability, suggesting right atrial pressure of 3 mmHg. IAS/Shunts: No atrial level shunt detected by color flow Doppler.  LEFT VENTRICLE PLAX 2D LVIDd:         5.40 cm   Diastology LVIDs:         3.90 cm   LV e' medial:    7.51 cm/s LV PW:         0.80 cm   LV E/e' medial:  4.7 LV IVS:        0.80 cm   LV e' lateral:   7.18 cm/s LVOT diam:     2.30 cm   LV E/e' lateral: 4.9 LV SV:         81 LV SV Index:   33 LVOT Area:     4.15 cm                           3D Volume EF:                          3D EF:        51 %  LV EDV:       152 ml                          LV ESV:       75 ml                          LV SV:        77 ml RIGHT VENTRICLE             IVC RV Basal diam:  2.40 cm     IVC diam: 1.30 cm RV Mid diam:    1.70 cm RV S prime:     10.40 cm/s TAPSE  (M-mode): 1.7 cm LEFT ATRIUM             Index        RIGHT ATRIUM           Index LA diam:        4.00 cm 1.63 cm/m   RA Area:     14.60 cm LA Vol (A2C):   56.4 ml 22.94 ml/m  RA Volume:   26.20 ml  10.66 ml/m LA Vol (A4C):   44.4 ml 18.06 ml/m LA Biplane Vol: 49.9 ml 20.30 ml/m  AORTIC VALVE AV Area (Vmax):    3.02 cm AV Area (Vmean):   2.94 cm AV Area (VTI):     3.32 cm AV Vmax:           136.36 cm/s AV Vmean:          93.072 cm/s AV VTI:            0.243 m AV Peak Grad:      7.4 mmHg AV Mean Grad:      4.0 mmHg LVOT Vmax:         99.10 cm/s LVOT Vmean:        65.900 cm/s LVOT VTI:          0.194 m LVOT/AV VTI ratio: 0.80 AI PHT:            673 msec  AORTA Ao Root diam: 3.30 cm Ao Asc diam:  3.20 cm MITRAL VALVE MV Area (PHT): 2.69 cm    SHUNTS MV Decel Time: 282 msec    Systemic VTI:  0.19 m MV E velocity: 35.00 cm/s  Systemic Diam: 2.30 cm MV A velocity: 60.30 cm/s MV E/A ratio:  0.58 Dorn Ross MD Electronically signed by Dorn Ross MD Signature Date/Time: 04/11/2024/12:01:34 PM    Final     Microbiology: Results for orders placed or performed during the hospital encounter of 05/01/24  Culture, blood (Routine x 2)     Status: None (Preliminary result)   Collection Time: 05/01/24 10:20 AM   Specimen: BLOOD  Result Value Ref Range Status   Specimen Description BLOOD RIGHT ANTECUBITAL  Final   Special Requests   Final    BOTTLES DRAWN AEROBIC AND ANAEROBIC Blood Culture adequate volume   Culture   Final    NO GROWTH 2 DAYS Performed at [facility], [address], [place], [place] [address]    Report Status PENDING  Incomplete  Resp panel by RT-PCR (RSV, Flu A&B, Covid) Anterior Nasal Swab     Status: None   Collection Time: 05/01/24 10:20 AM   Specimen: Anterior Nasal Swab  Result Value Ref Range Status   SARS Coronavirus 2 by RT PCR NEGATIVE NEGATIVE Final  Comment: (NOTE) SARS-CoV-2 target nucleic acids are NOT DETECTED.  The SARS-CoV-2 RNA is generally detectable  in upper respiratory specimens during the acute phase of infection. The lowest concentration of SARS-CoV-2 viral copies this assay can detect is 138 copies/mL. A negative result does not preclude SARS-Cov-2 infection and should not be used as the sole basis for treatment or other patient management decisions. A negative result may occur with  improper specimen collection/handling, submission of specimen other than nasopharyngeal swab, presence of viral mutation(s) within the areas targeted by this assay, and inadequate number of viral copies(<138 copies/mL). A negative result must be combined with clinical observations, patient history, and epidemiological information. The expected result is Negative.  Fact Sheet for Patients:  BloggerCourse.com  Fact Sheet for Healthcare Providers:  SeriousBroker.it  This test is no t yet approved or cleared by the [place]  FDA and  has been authorized for detection and/or diagnosis of SARS-CoV-2 by FDA under an Emergency Use Authorization (EUA). This EUA will remain  in effect (meaning this test can be used) for the duration of the COVID-19 declaration under Section 564(b)(1) of the Act, 21 U.S.C.section 360bbb-3(b)(1), unless the authorization is terminated  or revoked sooner.       Influenza A by PCR NEGATIVE NEGATIVE Final   Influenza B by PCR NEGATIVE NEGATIVE Final    Comment: (NOTE) The Xpert Xpress SARS-CoV-2/FLU/RSV plus assay is intended as an aid in the diagnosis of influenza from Nasopharyngeal swab specimens and should not be used as a sole basis for treatment. Nasal washings and aspirates are unacceptable for Xpert Xpress SARS-CoV-2/FLU/RSV testing.  Fact Sheet for Patients: BloggerCourse.com  Fact Sheet for Healthcare Providers: SeriousBroker.it  This test is not yet approved or cleared by the [place]  FDA and has  been authorized for detection and/or diagnosis of SARS-CoV-2 by FDA under an Emergency Use Authorization (EUA). This EUA will remain in effect (meaning this test can be used) for the duration of the COVID-19 declaration under Section 564(b)(1) of the Act, 21 U.S.C. section 360bbb-3(b)(1), unless the authorization is terminated or revoked.     Resp Syncytial Virus by PCR NEGATIVE NEGATIVE Final    Comment: (NOTE) Fact Sheet for Patients: BloggerCourse.com  Fact Sheet for Healthcare Providers: SeriousBroker.it  This test is not yet approved or cleared by the [place]  FDA and has been authorized for detection and/or diagnosis of SARS-CoV-2 by FDA under an Emergency Use Authorization (EUA). This EUA will remain in effect (meaning this test can be used) for the duration of the COVID-19 declaration under Section 564(b)(1) of the Act, 21 U.S.C. section 360bbb-3(b)(1), unless the authorization is terminated or revoked.  Performed at [facility], [address], [place], [place] [address]   Culture, blood (Routine x 2)     Status: None (Preliminary result)   Collection Time: 05/01/24 10:49 AM   Specimen: BLOOD  Result Value Ref Range Status   Specimen Description BLOOD BLOOD LEFT ARM  Final   Special Requests   Final    BOTTLES DRAWN AEROBIC AND ANAEROBIC Blood Culture adequate volume   Culture   Final    NO GROWTH 2 DAYS Performed at [facility], [address], [place], [place] [address]    Report Status PENDING  Incomplete  MRSA Next Gen by PCR, Nasal     Status: None   Collection Time: 05/01/24  2:29 PM   Specimen: Nasal Mucosa; Nasal Swab  Result Value Ref Range Status   MRSA by PCR Next Gen  NOT DETECTED NOT DETECTED Final    Comment: (NOTE) The GeneXpert MRSA Assay (FDA approved for NASAL specimens only), is one component of a comprehensive MRSA colonization surveillance program. It is not intended to diagnose MRSA  infection nor to guide or monitor treatment for MRSA infections. Test performance is not FDA approved in patients less than 22 years old. Performed at [facility], [address] [place], [place] [address]     Labs: CBC: Recent Labs  Lab 05/01/24 1020 05/02/24 0325 05/03/24 0455  WBC 14.7* 11.4* 12.2*  NEUTROABS 10.9*  --   --   HGB 15.2 11.7* 12.3*  HCT 46.5 35.7* 37.3*  MCV 91.7 90.6 90.5  PLT 324 226 218   Basic Metabolic Panel: Recent Labs  Lab 05/01/24 1020 05/02/24 0325 05/03/24 0455  NA 135 137 136  K 4.2 3.9 3.7  CL 96* 102 105  CO2 21* 26 23  GLUCOSE 103* 105* 109*  BUN 25* 22 20  CREATININE 1.37* 1.08 0.93  CALCIUM 10.2 8.9 8.8*  MG  --  1.8 2.0   Liver Function Tests: Recent Labs  Lab 05/01/24 1020  AST 17  ALT 12  ALKPHOS 71  BILITOT 0.5  PROT 8.4*  ALBUMIN 3.3*   CBG: No results for input(s): GLUCAP in the last 168 hours.  Discharge time spent: greater than 30 minutes.  Signed: Alm Schneider, MD [facility] 05/03/2024

## 2024-05-03 NOTE — Plan of Care (Signed)
  Problem: Education: Goal: Knowledge of General Education information will improve Description: Including pain rating scale, medication(s)/side effects and non-pharmacologic comfort measures Outcome: Progressing   Problem: Nutrition: Goal: Adequate nutrition will be maintained Outcome: Progressing   Problem: Pain Managment: Goal: General experience of comfort will improve and/or be controlled Outcome: Progressing

## 2024-05-03 NOTE — Progress Notes (Signed)
 PHARMACY - ANTICOAGULATION CONSULT NOTE  Pharmacy Consult for warfarin Indication: atrial fibrillation  Allergies  Allergen Reactions   Bee Venom Anaphylaxis    syncope    Patient Measurements: Height: 6' 1 (185.4 cm) Weight: 120.8 kg (266 lb 5.1 oz) IBW/kg (Calculated) : 79.9 HEPARIN  DW (KG): 106.2  Vital Signs: Temp: 97.9 F (36.6 C) (09/19 0404) Temp Source: Axillary (09/19 0404) BP: 138/70 (09/19 0600) Pulse Rate: 103 (09/19 0600)  Labs: Recent Labs    05/01/24 1020 05/01/24 1231 05/02/24 0325 05/03/24 0455  HGB 15.2  --  11.7* 12.3*  HCT 46.5  --  35.7* 37.3*  PLT 324  --  226 218  LABPROT 41.0*  --  48.2* 30.9*  INR 4.0*  --  5.0* 2.8*  CREATININE 1.37*  --  1.08 0.93  TROPONINIHS 8 6  --   --     Estimated Creatinine Clearance: 100.7 mL/min (by C-G formula based on SCr of 0.93 mg/dL).   Medical History: Past Medical History:  Diagnosis Date   Arthritis    Asthma    Atrial fibrillation (HCC)    Cancer (HCC)    Complication of anesthesia    after his neck surgery pt. trying to get up and walk out   COPD (chronic obstructive pulmonary disease) (HCC)    Depression    History of kidney stones    2021   Hypertension    Metastatic lung carcinoma, right (HCC) 08/2023   Stroke (HCC)     Medications:  Medications Prior to Admission  Medication Sig Dispense Refill Last Dose/Taking   albuterol  (VENTOLIN  HFA) 108 (90 Base) MCG/ACT inhaler Inhale 1 puff into the lungs every 4 (four) hours as needed for wheezing. 18 g 1 Unknown   binimetinib  (MEKTOVI ) 15 MG tablet Take 3 tablets (45 mg total) by mouth 2 (two) times daily. 180 tablet 0 04/30/2024 Evening   budesonide -glycopyrrolate -formoterol  (BREZTRI  AEROSPHERE) 160-9-4.8 MCG/ACT AERO inhaler Inhale 2 puffs into the lungs in the morning and at bedtime. 10.7 g 1 05/01/2024 Morning   cetirizine (ZYRTEC) 10 MG tablet Take 10 mg by mouth in the morning.   04/30/2024 Morning   citalopram  (CELEXA ) 20 MG tablet Take  20 mg by mouth in the morning.   04/30/2024   encorafenib  (BRAFTOVI ) 75 MG capsule Take 6 capsules (450 mg total) by mouth daily. (Patient taking differently: Take 450 mg by mouth every evening.) 180 capsule 0 04/30/2024 Evening   EPINEPHrine  0.3 mg/0.3 mL IJ SOAJ injection Inject 0.3 mg into the muscle as needed for anaphylaxis.   Unknown   metoprolol  tartrate (LOPRESSOR ) 50 MG tablet Take 1 tablet (50 mg total) by mouth 2 (two) times daily. 180 tablet 2 05/01/2024 Morning   prochlorperazine  (COMPAZINE ) 10 MG tablet TAKE 1 TABLET(10 MG) BY MOUTH EVERY 6 HOURS AS NEEDED 60 tablet 2 04/30/2024 Evening   warfarin (COUMADIN ) 10 MG tablet Take 1 tablet (10 mg total) by mouth daily. Or as directed by coumadin  clinic (Patient taking differently: Take 10 mg by mouth See admin instructions. Hold on 09/17,09/18, and 09/19 Then on 09/20 take 1 tablet (10 mg),Monday 1/2 tablet (5mg ), Tuesday 1 tablet (10 mg), Wednesday 1/2 tablet (5 mg), Thursday 1 tablet (10 mg), Friday 1/2 tablet ( 5 mg), Saturday 1 tablet (10 mg), Sunday 1 tablet (10 mg) MON 09/29 return for next INR.) 30 tablet 3 04/30/2024 at  8:00 PM    Assessment: Pharmacy consulted to dose warfarin in patient with atrial fibrillation.  Home dose was  recently reduced to 5 mg MWF and 10 mg ROW at 9/17 Anti-coag visit due to INR of 5.8.  INR currently 4.0 in ER. (Doses held on 9/17 and 9/18) INR 4.0 > 5.0> 2.8 , therapeutic now  Goal of Therapy:  INR 2-3 Monitor platelets by anticoagulation protocol: Yes   Plan:  Warfarin 5mg  x 1 dose. Monitor daily INR and s/s of bleeding.  Guy Seese, BS Pharm D, BCPS Clinical Pharmacist 05/03/2024 8:13 AM

## 2024-05-06 ENCOUNTER — Telehealth: Payer: Self-pay

## 2024-05-06 LAB — CULTURE, BLOOD (ROUTINE X 2)
Culture: NO GROWTH
Culture: NO GROWTH
Special Requests: ADEQUATE
Special Requests: ADEQUATE

## 2024-05-06 MED ORDER — PERFLUTREN LIPID MICROSPHERE
1.0000 mL | INTRAVENOUS | Status: AC | PRN
  Administered 2024-05-06: 2 mL via INTRAVENOUS

## 2024-05-06 NOTE — Transitions of Care (Post Inpatient/ED Visit) (Signed)
 05/06/2024  Name: Jose Joseph MRN: [id number] DOB: 01-29-1956  Today's TOC FU Call Status: Today's TOC FU Call Status:: Successful TOC FU Call Completed TOC FU Call Complete Date: 05/06/24 Patient's Name and Date of Birth confirmed.  Transition Care Management Follow-up Telephone Call Date of Discharge: 05/03/24 [facility] Type of Discharge: Inpatient Admission Primary Inpatient Discharge Diagnosis:: SIRS (systemic inflammatory response syndrome) How have you been since you were released from the hospital?: Better Any questions or concerns?: No  Items Reviewed: Medications obtained,verified, and reconciled?: Yes (Medications Reviewed) Any new allergies since your discharge?: No Dietary orders reviewed?: Yes Do you have support at home?: Yes People in Home [RPT]: spouse Name of Support/Comfort Primary Source: [name]  Medications Reviewed Today: Medications Reviewed Today     Reviewed by Christine Schiefelbein, RN (Case Manager) on 05/06/24 at [phone]  Med List Status: <None>   Medication Order Taking? Sig Documenting Provider Last Dose Status Informant  albuterol  (VENTOLIN  HFA) 108 (90 Base) MCG/ACT inhaler [id number] Yes Inhale 1 puff into the lungs every 4 (four) hours as needed for wheezing. Tobie Yetta HERO, MD  Active Self, Pharmacy Records, Spouse/Significant Other, Other, Multiple Informants  binimetinib  (MEKTOVI ) 15 MG tablet [id number] Yes Take 3 tablets (45 mg total) by mouth 2 (two) times daily. Kandala, Hyndavi, MD  Active Self, Pharmacy Records, Spouse/Significant Other, Other, Multiple Informants  budesonide -glycopyrrolate -formoterol  (BREZTRI  AEROSPHERE) 160-9-4.8 MCG/ACT AERO inhaler [id number] Yes Inhale 2 puffs into the lungs in the morning and at bedtime. Tobie Yetta HERO, MD  Active Self, Pharmacy Records, Spouse/Significant Other, Other, Multiple Informants  cetirizine (ZYRTEC) 10 MG tablet [id number] Yes Take 10 mg by mouth in the morning. [provider]  Active Self, Pharmacy Records, Spouse/Significant Other, Other, Multiple Informants  citalopram  (CELEXA ) 20 MG tablet [id number] Yes Take 20 mg by mouth in the morning. [provider]  Active Self, Pharmacy Records, Spouse/Significant Other, Other, Multiple Informants  encorafenib  (BRAFTOVI ) 75 MG capsule [id number] Yes Take 6 capsules (450 mg total) by mouth daily. Davonna Siad, MD  Active Self, Pharmacy Records, Spouse/Significant Other, Other, Multiple Informants  EPINEPHrine  0.3 mg/0.3 mL IJ SOAJ injection [id number] Yes Inject 0.3 mg into the muscle as needed for anaphylaxis. [provider]  Active Self, Pharmacy Records, Spouse/Significant Other, Other, Multiple Informants  heparin  lock flush 100 unit/mL [id number]   Rogers Hai, MD  Active   metoprolol  tartrate (LOPRESSOR ) 50 MG tablet [id number] Yes Take 1 tablet (50 mg total) by mouth 2 (two) times daily. Mallipeddi, Diannah SQUIBB, MD  Active Self, Pharmacy Records, Spouse/Significant Other, Other, Multiple Informants  prochlorperazine  (COMPAZINE ) 10 MG tablet [id number] Yes TAKE 1 TABLET(10 MG) BY MOUTH EVERY 6 HOURS AS NEEDED Rogers Hai, MD  Active Self, Pharmacy Records, Spouse/Significant Other, Other, Multiple Informants  warfarin (COUMADIN ) 10 MG tablet [id number] Yes Or as directed by coumadin  clinic.  Take 5 mg MWF and 10 mg all other days Tat, Alm, MD  Active   Med List Note Teretha Renaee SAILOR, RPH-CPP 01/25/24 9090): Mektovi  and Braftovi  filled at [facility] (Specialty)            Home Care and Equipment/Supplies: Were Home Health Services Ordered?: No Any new equipment or medical supplies ordered?: No  Functional Questionnaire: Do you need assistance with bathing/showering or dressing?: No Do you need assistance with meal preparation?: No Do you need assistance with eating?: No Do you have difficulty maintaining continence: No Do you need assistance with getting  out of bed/getting  out of a chair/moving?: No Do you have difficulty managing or taking your medications?: No  Follow up appointments reviewed: PCP Follow-up appointment confirmed?: No (patient to call PCP) MD Provider Line Number:[phone] Given: No Specialist Hospital Follow-up appointment confirmed?: Yes Date of Specialist follow-up appointment?: 05/20/24 Follow-Up Specialty Provider:: [facility] Do you need transportation to your follow-up appointment?: No Do you understand care options if your condition(s) worsen?: Yes-patient verbalized understanding  SDOH Interventions Today    Flowsheet Row Most Recent Value  SDOH Interventions   Food Insecurity Interventions Intervention Not Indicated  Housing Interventions Intervention Not Indicated  Transportation Interventions Intervention Not Indicated  Utilities Interventions Intervention Not Indicated   Discussed and offered 30 day TOC program.  Patient  declined program.  The patient has been provided with contact information for the care management team and has been advised to call with any health -related questions or concerns.  The patient verbalized understanding with current plan of care.  The patient is directed to their insurance card regarding availability of benefits coverage.   Lisbet Busker J. Camielle Sizer RN, MSN [facility], [facility] RN Care Manager Direct Dial: [phone]  Fax: [phone] Website: [email or web address]

## 2024-05-06 NOTE — Patient Instructions (Signed)
 Visit Information  Thank you for taking time to visit with me today. Please don't hesitate to contact me if I can be of assistance to you   Please monitor for: Fever, confusion, chills, increased pain/discomfort, or increased shortness of breath.  Patient instructed to call provider or go to ER if symptoms are persistent.   Patient verbalizes understanding of instructions and care plan provided today and agrees to view in MyChart. Active MyChart status and patient understanding of how to access instructions and care plan via MyChart confirmed with patient.     The patient has been provided with contact information for the care management team and has been advised to call with any health related questions or concerns.   Please call the care guide team at [phone] if you need to cancel or reschedule your appointment.   Please call the Suicide and Crisis Lifeline: 988 if you are experiencing a Mental Health or Behavioral Health Crisis or need someone to talk to.  Bluma Buresh J. Shanequia Kendrick RN, MSN [facility], [facility] RN Care Manager Direct Dial: [phone]  Fax: [phone] Website: [email or web address]

## 2024-05-09 ENCOUNTER — Other Ambulatory Visit: Payer: Self-pay

## 2024-05-09 ENCOUNTER — Inpatient Hospital Stay (HOSPITAL_COMMUNITY)
Admission: EM | Admit: 2024-05-09 | Discharge: 2024-05-14 | DRG: 308 | Disposition: A | Discharge status: Home Health | Attending: Internal Medicine | Admitting: Internal Medicine

## 2024-05-09 ENCOUNTER — Emergency Department (HOSPITAL_COMMUNITY)

## 2024-05-09 ENCOUNTER — Encounter (HOSPITAL_COMMUNITY): Payer: Self-pay

## 2024-05-09 DIAGNOSIS — I110 Hypertensive heart disease with heart failure: Secondary | ICD-10-CM | POA: Diagnosis present

## 2024-05-09 DIAGNOSIS — D6869 Other thrombophilia: Secondary | ICD-10-CM | POA: Diagnosis present

## 2024-05-09 DIAGNOSIS — Z87442 Personal history of urinary calculi: Secondary | ICD-10-CM | POA: Diagnosis not present

## 2024-05-09 DIAGNOSIS — J440 Chronic obstructive pulmonary disease with acute lower respiratory infection: Secondary | ICD-10-CM | POA: Diagnosis present

## 2024-05-09 DIAGNOSIS — G893 Neoplasm related pain (acute) (chronic): Secondary | ICD-10-CM | POA: Diagnosis present

## 2024-05-09 DIAGNOSIS — I2489 Other forms of acute ischemic heart disease: Secondary | ICD-10-CM | POA: Diagnosis present

## 2024-05-09 DIAGNOSIS — R350 Frequency of micturition: Secondary | ICD-10-CM | POA: Diagnosis present

## 2024-05-09 DIAGNOSIS — R079 Chest pain, unspecified: Secondary | ICD-10-CM | POA: Diagnosis not present

## 2024-05-09 DIAGNOSIS — I639 Cerebral infarction, unspecified: Secondary | ICD-10-CM | POA: Diagnosis not present

## 2024-05-09 DIAGNOSIS — Z87891 Personal history of nicotine dependence: Secondary | ICD-10-CM | POA: Diagnosis not present

## 2024-05-09 DIAGNOSIS — J9691 Respiratory failure, unspecified with hypoxia: Secondary | ICD-10-CM | POA: Diagnosis present

## 2024-05-09 DIAGNOSIS — Z8673 Personal history of transient ischemic attack (TIA), and cerebral infarction without residual deficits: Secondary | ICD-10-CM | POA: Diagnosis not present

## 2024-05-09 DIAGNOSIS — J189 Pneumonia, unspecified organism: Secondary | ICD-10-CM | POA: Diagnosis present

## 2024-05-09 DIAGNOSIS — Z96642 Presence of left artificial hip joint: Secondary | ICD-10-CM | POA: Diagnosis present

## 2024-05-09 DIAGNOSIS — Z1152 Encounter for screening for COVID-19: Secondary | ICD-10-CM

## 2024-05-09 DIAGNOSIS — R569 Unspecified convulsions: Secondary | ICD-10-CM | POA: Diagnosis present

## 2024-05-09 DIAGNOSIS — I5033 Acute on chronic diastolic (congestive) heart failure: Secondary | ICD-10-CM

## 2024-05-09 DIAGNOSIS — J449 Chronic obstructive pulmonary disease, unspecified: Secondary | ICD-10-CM | POA: Diagnosis present

## 2024-05-09 DIAGNOSIS — I4891 Unspecified atrial fibrillation: Secondary | ICD-10-CM | POA: Diagnosis not present

## 2024-05-09 DIAGNOSIS — I5032 Chronic diastolic (congestive) heart failure: Secondary | ICD-10-CM

## 2024-05-09 DIAGNOSIS — C7951 Secondary malignant neoplasm of bone: Secondary | ICD-10-CM | POA: Diagnosis present

## 2024-05-09 DIAGNOSIS — I4819 Other persistent atrial fibrillation: Principal | ICD-10-CM | POA: Diagnosis present

## 2024-05-09 DIAGNOSIS — E66811 Obesity, class 1: Secondary | ICD-10-CM | POA: Diagnosis present

## 2024-05-09 DIAGNOSIS — C3490 Malignant neoplasm of unspecified part of unspecified bronchus or lung: Secondary | ICD-10-CM | POA: Diagnosis present

## 2024-05-09 DIAGNOSIS — Z743 Need for continuous supervision: Secondary | ICD-10-CM | POA: Diagnosis not present

## 2024-05-09 DIAGNOSIS — Z91030 Bee allergy status: Secondary | ICD-10-CM

## 2024-05-09 DIAGNOSIS — I10 Essential (primary) hypertension: Secondary | ICD-10-CM | POA: Diagnosis not present

## 2024-05-09 DIAGNOSIS — R000 Tachycardia, unspecified: Secondary | ICD-10-CM | POA: Diagnosis not present

## 2024-05-09 DIAGNOSIS — I493 Ventricular premature depolarization: Secondary | ICD-10-CM | POA: Diagnosis present

## 2024-05-09 DIAGNOSIS — Z7951 Long term (current) use of inhaled steroids: Secondary | ICD-10-CM | POA: Diagnosis not present

## 2024-05-09 DIAGNOSIS — Z6835 Body mass index (BMI) 35.0-35.9, adult: Secondary | ICD-10-CM

## 2024-05-09 DIAGNOSIS — I249 Acute ischemic heart disease, unspecified: Principal | ICD-10-CM

## 2024-05-09 DIAGNOSIS — Z79899 Other long term (current) drug therapy: Secondary | ICD-10-CM | POA: Diagnosis not present

## 2024-05-09 DIAGNOSIS — I959 Hypotension, unspecified: Secondary | ICD-10-CM | POA: Diagnosis not present

## 2024-05-09 DIAGNOSIS — Z8719 Personal history of other diseases of the digestive system: Secondary | ICD-10-CM

## 2024-05-09 DIAGNOSIS — Z7901 Long term (current) use of anticoagulants: Secondary | ICD-10-CM | POA: Diagnosis not present

## 2024-05-09 DIAGNOSIS — Z923 Personal history of irradiation: Secondary | ICD-10-CM

## 2024-05-09 LAB — URINALYSIS, ROUTINE W REFLEX MICROSCOPIC
Bilirubin Urine: NEGATIVE
Glucose, UA: NEGATIVE mg/dL
Ketones, ur: NEGATIVE mg/dL
Leukocytes,Ua: NEGATIVE
Nitrite: NEGATIVE
Protein, ur: NEGATIVE mg/dL
Specific Gravity, Urine: 1.019 (ref 1.005–1.030)
pH: 5 (ref 5.0–8.0)

## 2024-05-09 LAB — HEPARIN LEVEL (UNFRACTIONATED): Heparin Unfractionated: 0.15 [IU]/mL — ABNORMAL LOW (ref 0.30–0.70)

## 2024-05-09 LAB — BASIC METABOLIC PANEL WITH GFR
Anion gap: 14 (ref 5–15)
BUN: 28 mg/dL — ABNORMAL HIGH (ref 8–23)
CO2: 23 mmol/L (ref 22–32)
Calcium: 9.3 mg/dL (ref 8.9–10.3)
Chloride: 100 mmol/L (ref 98–111)
Creatinine, Ser: 1.11 mg/dL (ref 0.61–1.24)
GFR, Estimated: >60 mL/min (ref >60)
Glucose, Bld: 110 mg/dL — ABNORMAL HIGH (ref 70–99)
Potassium: 3.8 mmol/L (ref 3.5–5.1)
Sodium: 137 mmol/L (ref 135–145)

## 2024-05-09 LAB — HEMOGLOBIN A1C
Hgb A1c MFr Bld: 5.2 % (ref 4.8–5.6)
Mean Plasma Glucose: 102.54 mg/dL

## 2024-05-09 LAB — GLUCOSE, CAPILLARY
Glucose-Capillary: 136 mg/dL — ABNORMAL HIGH (ref 70–99)
Glucose-Capillary: 100 mg/dL — ABNORMAL HIGH (ref 70–99)
Glucose-Capillary: 103 mg/dL — ABNORMAL HIGH (ref 70–99)

## 2024-05-09 LAB — CBC
HCT: 39.8 % (ref 39.0–52.0)
Hemoglobin: 12.9 g/dL — ABNORMAL LOW (ref 13.0–17.0)
MCH: 29.5 pg (ref 26.0–34.0)
MCHC: 32.4 g/dL (ref 30.0–36.0)
MCV: 90.9 fL (ref 80.0–100.0)
Platelets: 176 K/uL (ref 150–400)
RBC: 4.38 MIL/uL (ref 4.22–5.81)
RDW: 13 % (ref 11.5–15.5)
WBC: 16.2 K/uL — ABNORMAL HIGH (ref 4.0–10.5)
nRBC: 0 % (ref 0.0–0.2)

## 2024-05-09 LAB — PROTIME-INR
INR: 1.6 — ABNORMAL HIGH (ref 0.8–1.2)
Prothrombin Time: 20.1 s — ABNORMAL HIGH (ref 11.4–15.2)

## 2024-05-09 LAB — PSA: Prostatic Specific Antigen: 5.4 ng/mL — ABNORMAL HIGH (ref 0.00–4.00)

## 2024-05-09 LAB — MRSA NEXT GEN BY PCR, NASAL: MRSA by PCR Next Gen: NOT DETECTED

## 2024-05-09 LAB — TROPONIN I (HIGH SENSITIVITY)
Troponin I (High Sensitivity): 320 ng/L (ref <18)
Troponin I (High Sensitivity): 452 ng/L (ref <18)

## 2024-05-09 MED ORDER — CHLORHEXIDINE GLUCONATE CLOTH 2 % EX PADS
6.0000 | MEDICATED_PAD | Freq: Every day | CUTANEOUS | Status: DC
  Administered 2024-05-10 – 2024-05-12 (×2): 6 via TOPICAL

## 2024-05-09 MED ORDER — AMIODARONE HCL IN DEXTROSE 360-4.14 MG/200ML-% IV SOLN
60.0000 mg/h | INTRAVENOUS | Status: AC
  Administered 2024-05-09 (×2): 60 mg/h via INTRAVENOUS
  Filled 2024-05-09 (×2): qty 200

## 2024-05-09 MED ORDER — INSULIN ASPART 100 UNIT/ML IJ SOLN
0.0000 [IU] | Freq: Three times a day (TID) | INTRAMUSCULAR | Status: DC
  Administered 2024-05-09 – 2024-05-13 (×5): 1 [IU] via SUBCUTANEOUS

## 2024-05-09 MED ORDER — METOPROLOL SUCCINATE ER 25 MG PO TB24: 25.0000 mg | ORAL_TABLET | Freq: Every day | ORAL | Status: DC

## 2024-05-09 MED ORDER — OXYCODONE HCL 5 MG PO TABS
5.0000 mg | ORAL_TABLET | ORAL | Status: DC | PRN
  Administered 2024-05-09 – 2024-05-13 (×10): 5 mg via ORAL
  Filled 2024-05-09 (×12): qty 1

## 2024-05-09 MED ORDER — SODIUM CHLORIDE 0.9% FLUSH
10.0000 mL | Freq: Two times a day (BID) | INTRAVENOUS | Status: DC
  Administered 2024-05-09 – 2024-05-14 (×10): 10 mL

## 2024-05-09 MED ORDER — WARFARIN SODIUM 5 MG PO TABS
10.0000 mg | ORAL_TABLET | Freq: Once | ORAL | Status: AC
  Administered 2024-05-09: 10 mg via ORAL
  Filled 2024-05-09: qty 2

## 2024-05-09 MED ORDER — BINIMETINIB 15 MG PO TABS
45.0000 mg | ORAL_TABLET | Freq: Two times a day (BID) | ORAL | Status: DC
  Administered 2024-05-09 – 2024-05-14 (×11): 45 mg via ORAL
  Filled 2024-05-09 (×10): qty 3

## 2024-05-09 MED ORDER — METOPROLOL TARTRATE 5 MG/5ML IV SOLN
2.5000 mg | Freq: Once | INTRAVENOUS | Status: AC
  Administered 2024-05-09: 2.5 mg via INTRAVENOUS
  Filled 2024-05-09: qty 5

## 2024-05-09 MED ORDER — CHLORHEXIDINE GLUCONATE CLOTH 2 % EX PADS
6.0000 | MEDICATED_PAD | Freq: Every day | CUTANEOUS | Status: DC
Start: 2024-05-09 — End: 2024-05-14
  Administered 2024-05-09 – 2024-05-14 (×5): 6 via TOPICAL

## 2024-05-09 MED ORDER — MORPHINE SULFATE (PF) 4 MG/ML IV SOLN
4.0000 mg | Freq: Once | INTRAVENOUS | Status: AC
  Administered 2024-05-09: 4 mg via INTRAVENOUS
  Filled 2024-05-09: qty 1

## 2024-05-09 MED ORDER — ASPIRIN 81 MG PO CHEW
324.0000 mg | CHEWABLE_TABLET | Freq: Once | ORAL | Status: AC
Start: 2024-05-09 — End: 2024-05-09
  Administered 2024-05-09: 324 mg via ORAL
  Filled 2024-05-09: qty 4

## 2024-05-09 MED ORDER — HEPARIN (PORCINE) 25000 UT/250ML-% IV SOLN
2100.0000 [IU]/h | INTRAVENOUS | Status: DC
  Administered 2024-05-09: 1500 [IU]/h via INTRAVENOUS
  Administered 2024-05-10: 2100 [IU]/h via INTRAVENOUS
  Administered 2024-05-10: 1850 [IU]/h via INTRAVENOUS
  Filled 2024-05-09 (×3): qty 250

## 2024-05-09 MED ORDER — WARFARIN - PHARMACIST DOSING INPATIENT: Freq: Every day | Status: DC

## 2024-05-09 MED ORDER — HEPARIN BOLUS VIA INFUSION
2500.0000 [IU] | Freq: Once | INTRAVENOUS | Status: AC
  Administered 2024-05-09: 2500 [IU] via INTRAVENOUS
  Filled 2024-05-09: qty 2500

## 2024-05-09 MED ORDER — TAMSULOSIN HCL 0.4 MG PO CAPS
0.4000 mg | ORAL_CAPSULE | Freq: Every day | ORAL | Status: DC
Start: 2024-05-09 — End: 2024-05-14
  Administered 2024-05-09 – 2024-05-14 (×6): 0.4 mg via ORAL
  Filled 2024-05-09 (×6): qty 1

## 2024-05-09 MED ORDER — SODIUM CHLORIDE 0.9% FLUSH
10.0000 mL | INTRAVENOUS | Status: DC | PRN
  Administered 2024-05-14: 10 mL

## 2024-05-09 MED ORDER — CITALOPRAM HYDROBROMIDE 20 MG PO TABS
20.0000 mg | ORAL_TABLET | Freq: Every morning | ORAL | Status: DC
Start: 2024-05-09 — End: 2024-05-14
  Administered 2024-05-09 – 2024-05-14 (×6): 20 mg via ORAL
  Filled 2024-05-09 (×6): qty 1

## 2024-05-09 MED ORDER — METOPROLOL TARTRATE 25 MG PO TABS
25.0000 mg | ORAL_TABLET | Freq: Four times a day (QID) | ORAL | Status: DC
  Administered 2024-05-09 – 2024-05-10 (×5): 25 mg via ORAL
  Filled 2024-05-09 (×5): qty 1

## 2024-05-09 MED ORDER — ENCORAFENIB 75 MG PO CAPS
450.0000 mg | ORAL_CAPSULE | Freq: Every day | ORAL | Status: DC
  Administered 2024-05-09 – 2024-05-13 (×5): 450 mg via ORAL
  Filled 2024-05-09 (×5): qty 6

## 2024-05-09 MED ORDER — DILTIAZEM LOAD VIA INFUSION
10.0000 mg | Freq: Once | INTRAVENOUS | Status: AC
  Administered 2024-05-09: 10 mg via INTRAVENOUS
  Filled 2024-05-09: qty 10

## 2024-05-09 MED ORDER — AMIODARONE HCL IN DEXTROSE 360-4.14 MG/200ML-% IV SOLN
60.0000 mg/h | INTRAVENOUS | Status: DC
  Administered 2024-05-09: 30 mg/h via INTRAVENOUS
  Administered 2024-05-10 – 2024-05-13 (×13): 60 mg/h via INTRAVENOUS
  Filled 2024-05-09 (×14): qty 200

## 2024-05-09 MED ORDER — AMIODARONE LOAD VIA INFUSION
150.0000 mg | Freq: Once | INTRAVENOUS | Status: AC
Start: 2024-05-09 — End: 2024-05-09
  Administered 2024-05-09: 150 mg via INTRAVENOUS
  Filled 2024-05-09: qty 83.34

## 2024-05-09 MED ORDER — DILTIAZEM HCL-DEXTROSE 125-5 MG/125ML-% IV SOLN (PREMIX)
5.0000 mg/h | INTRAVENOUS | Status: DC
Start: 2024-05-09 — End: 2024-05-09
  Administered 2024-05-09: 5 mg/h via INTRAVENOUS
  Filled 2024-05-09: qty 125

## 2024-05-09 MED ORDER — NITROGLYCERIN IN D5W 200-5 MCG/ML-% IV SOLN
5.0000 ug/min | INTRAVENOUS | Status: DC
Start: 2024-05-09 — End: 2024-05-09
  Administered 2024-05-09: 5 ug/min via INTRAVENOUS
  Filled 2024-05-09: qty 250

## 2024-05-09 MED ORDER — ORAL CARE MOUTH RINSE
15.0000 mL | OROMUCOSAL | Status: DC | PRN
  Administered 2024-05-10: 15 mL via OROMUCOSAL

## 2024-05-09 NOTE — Progress Notes (Signed)
 PHARMACY - ANTICOAGULATION CONSULT NOTE  Pharmacy Consult for heparin  and warfarin Indication: atrial fibrillation  Allergies  Allergen Reactions   Bee Venom Anaphylaxis    syncope    Patient Measurements: Height: 6' 1 (185.4 cm) Weight: 120.8 kg (266 lb 5.1 oz) IBW/kg (Calculated) : 79.9 HEPARIN  DW (KG): 106.2  Vital Signs: Temp: 98.8 F (37.1 C) (09/25 1558) Temp Source: Oral (09/25 1558) BP: 111/73 (09/25 1600) Pulse Rate: 81 (09/25 1600)  Labs: Recent Labs    05/09/24 0455 05/09/24 0642 05/09/24 1758  HGB 12.9*  --   --   HCT 39.8  --   --   PLT 176  --   --   LABPROT 20.1*  --   --   INR 1.6*  --   --   HEPARINUNFRC  --   --  0.15*  CREATININE 1.11  --   --   TROPONINIHS 320* 452*  --     Estimated Creatinine Clearance: 84.3 mL/min (by C-G formula based on SCr of 1.11 mg/dL).   Assessment: 18 yoM with hx AF on warfarin PTA admitted with AF RVR and CP. Pt had AF DCCV 9/18, also has hx CVA. INR on admit low at 1.6, pharmacy to dose IV heparin  for now with ongoing CP. CBC ok.  Warfarin also restarted 9/25 Home dose is 5mg  MWF, 10mg  AODs.  New amio noted so may need reduced regimen longterm.  Heparin  level subtherapeutic (0.15) on infusion at 1500 units/hr. No issues with line or bleeding reported per RN.  Goal of Therapy:  Heparin  level 0.3-0.7 units/ml Monitor platelets by anticoagulation protocol: Yes   Plan:  Increase heparin  infusion to 1850 units/hr F/u 8 hr heparin  level  Vito Ralph, PharmD, BCPS Please see amion for complete clinical pharmacist phone list 05/09/2024

## 2024-05-09 NOTE — H&P (Signed)
 NAME:  Jose Joseph, MRN:  [id number], DOB:  Dec 19, 1955, LOS: 0 ADMISSION DATE:  05/09/2024, CONSULTATION DATE: 05/09/2024 REFERRING MD:  Geroldine Berg, MD , CHIEF COMPLAINT: Chest pain  History of Present Illness:  68 year old male with paroxysmal A-fib status post cardioversion about a week ago, hypertension, metastatic adenocarcinoma of lung troponins on immunotherapy, COPD and chronic HFpEF who presented to [facility] emergency department with complaint of ongoing chest pain.  Patient was noted to be in A-fib with RVR with heart rate in 140s, he was started on diltiazem  infusion, he continued to have chest pain, was started on nitroglycerin  infusion, cardiology evaluated the patient, decision was made to transfer patient to [facility] ICU.  Upon arrival patient noted to be in A-fib with RVR with HR in 140's, denies chest pain palpitation, shortness of breath, headache, fever, chills or other complaints Stated he has been losing weight for last few months due to low appetite, lost 11 pounds in the last 2 months.  Also complained of increased urinary frequency, denies dysuria  Pertinent  Medical History   Past Medical History:  Diagnosis Date   Arthritis    Asthma    Atrial fibrillation (HCC)    Cancer (HCC)    Complication of anesthesia    after his neck surgery pt. trying to get up and walk out   COPD (chronic obstructive pulmonary disease) (HCC)    Depression    History of kidney stones    2021   Hypertension    Metastatic lung carcinoma, right (HCC) 08/2023   Stroke (HCC)     Significant Hospital Events: Including procedures, antibiotic start and stop dates in addition to other pertinent events     Interim History / Subjective:  As above  Objective    Blood pressure 118/68, pulse (!) 143, temperature 97.9 F (36.6 C), temperature source Axillary, resp. rate 19, height 6' 1 (1.854 m), weight 120.8 kg, SpO2 94%.       No intake or output data in the 24 hours  ending 05/09/24 0959 Filed Weights   05/09/24 0433  Weight: 120.8 kg    Examination: General: Acute on chronically ill-appearing elderly male, lying on the bed HEENT: [place]/AT, eyes anicteric.  Dry mucus membranes Neuro: Alert, awake following commands Chest: Reduced air entry at the bases bilaterally,, no wheezes or rhonchi Heart: Irregularly irregular, no murmurs or gallops Abdomen: Soft, nontender, nondistended, bowel sounds present  Labs and images reviewed  Patient Lines/Drains/Airways Status     Active Line/Drains/Airways     Name Placement date Placement time Site Days   Implanted Port 12/14/23 Right Chest Single Power 12/14/23  1154  Chest  147   Peripheral IV 05/09/24 20 G 1 Anterior;Distal;Right;Upper Arm 05/09/24  0056  Arm  less than 1   External Urinary Catheter 05/09/24  0953  --  less than 1         Resolved problem list   Assessment and Plan  Paroxysmal A-fib with RVR status post multiple DCCV Demand cardiac ischemia in the setting of A-fib with RVR Chronic HFpEF Metastatic adenocarcinoma of lung to bones on immunotherapy maintenance COPD, not in exacerbation Hypertension Increased urinary frequency  Patient is currently on diltiazem  infusion, which is not preferred per oncology note Other drugs which are not preferred with immunotherapy including amiodarone  and Tikosyn Started on metoprolol  25 mg every 6 hours Will add digoxin if heart rate is not well-controlled Started on IV heparin  infusion for stroke prophylaxis Monitor intake  and output Avoid nephrotoxic agent Continue immunotherapy, outpatient follow-up with hematology/oncology Hold albuterol  nebs considering A-fib with RVR Check PSA and urinalysis to rule out BPH and acute urinary tract infection in the setting of increased urinary frequency  Labs   CBC: Recent Labs  Lab 05/03/24 0455 05/09/24 0455  WBC 12.2* 16.2*  HGB 12.3* 12.9*  HCT 37.3* 39.8  MCV 90.5 90.9  PLT 218 176     Basic Metabolic Panel: Recent Labs  Lab 05/03/24 0455 05/09/24 0455  NA 136 137  K 3.7 3.8  CL 105 100  CO2 23 23  GLUCOSE 109* 110*  BUN 20 28*  CREATININE 0.93 1.11  CALCIUM 8.8* 9.3  MG 2.0  --    GFR: Estimated Creatinine Clearance: 84.3 mL/min (by C-G formula based on SCr of 1.11 mg/dL). Recent Labs  Lab 05/03/24 0455 05/09/24 0455  WBC 12.2* 16.2*    Liver Function Tests: No results for input(s): AST, ALT, ALKPHOS, BILITOT, PROT, ALBUMIN  in the last 168 hours. No results for input(s): LIPASE, AMYLASE in the last 168 hours. No results for input(s): AMMONIA in the last 168 hours.  ABG No results found for: PHART, PCO2ART, PO2ART, HCO3, TCO2, ACIDBASEDEF, O2SAT   Coagulation Profile: Recent Labs  Lab 05/03/24 0455 05/09/24 0455  INR 2.8* 1.6*    Cardiac Enzymes: No results for input(s): CKTOTAL, CKMB, CKMBINDEX, TROPONINI in the last 168 hours.  HbA1C: No results found for: HGBA1C  CBG: No results for input(s): GLUCAP in the last 168 hours.  Review of Systems:   12 point review of system is significant apparently mentioned in HPI, rest is negative  Past Medical History:  He,  has a past medical history of Arthritis, Asthma, Atrial fibrillation (HCC), Cancer (HCC), Complication of anesthesia, COPD (chronic obstructive pulmonary disease) (HCC), Depression, History of kidney stones, Hypertension, Metastatic lung carcinoma, right (HCC) (08/2023), and Stroke (HCC).   Surgical History:   Past Surgical History:  Procedure Laterality Date   BRONCHIAL BIOPSY  11/21/2023   Procedure: BRONCHOSCOPY, WITH BIOPSY;  Surgeon: Shelah Lamar RAMAN, MD;  Location: [facility] ENDOSCOPY;  Service: Pulmonary;;   BRONCHIAL NEEDLE ASPIRATION BIOPSY  11/21/2023   Procedure: BRONCHOSCOPY, WITH NEEDLE ASPIRATION BIOPSY;  Surgeon: Shelah Lamar RAMAN, MD;  Location: [facility] ENDOSCOPY;  Service: Pulmonary;;   CARDIOVERSION N/A 01/01/2024   Procedure:  CARDIOVERSION;  Surgeon: Francyne Headland, MD;  Location: [facility] INVASIVE CV LAB;  Service: Cardiovascular;  Laterality: N/A;   COLONOSCOPY WITH PROPOFOL  N/A 01/19/2023   Procedure: COLONOSCOPY WITH PROPOFOL ;  Surgeon: Shaaron Lamar HERO, MD;  Location: [facility] ENDO SUITE;  Service: Endoscopy;  Laterality: N/A;  200pm, asa 2   ENDOBRONCHIAL ULTRASOUND Bilateral 11/21/2023   Procedure: ENDOBRONCHIAL ULTRASOUND (EBUS);  Surgeon: Shelah Lamar RAMAN, MD;  Location: [facility] ENDOSCOPY;  Service: Pulmonary;  Laterality: Bilateral;   HERNIA REPAIR     IR IMAGING GUIDED PORT INSERTION  12/14/2023   IR THORACENTESIS ASP PLEURAL SPACE W/IMG GUIDE  12/28/2023   KNEE ARTHROCENTESIS     NECK SURGERY     POLYPECTOMY  01/19/2023   Procedure: POLYPECTOMY;  Surgeon: Shaaron Lamar HERO, MD;  Location: [facility] ENDO SUITE;  Service: Endoscopy;;   TOTAL HIP ARTHROPLASTY Left 01/15/2021   Procedure: LEFT TOTAL HIP ARTHROPLASTY ANTERIOR APPROACH;  Surgeon: Barbarann Oneil BROCKS, MD;  Location: [facility] OR;  Service: Orthopedics;  Laterality: Left;   TRANSESOPHAGEAL ECHOCARDIOGRAM (CATH LAB) N/A 01/01/2024   Procedure: TRANSESOPHAGEAL ECHOCARDIOGRAM;  Surgeon: Francyne Headland, MD;  Location: [facility] INVASIVE CV LAB;  Service:  Cardiovascular;  Laterality: N/A;   VIDEO BRONCHOSCOPY WITH ENDOBRONCHIAL NAVIGATION Bilateral 11/21/2023   Procedure: VIDEO BRONCHOSCOPY WITH ENDOBRONCHIAL NAVIGATION;  Surgeon: Shelah Lamar RAMAN, MD;  Location: [facility] ENDOSCOPY;  Service: Pulmonary;  Laterality: Bilateral;   WRIST ARTHROPLASTY       Social History:   reports that he quit smoking about 11 years ago. His smoking use included cigarettes. He started smoking about 46 years ago. He has a 35 pack-year smoking history. He has been exposed to tobacco smoke. He has never used smokeless tobacco. He reports that he does not currently use alcohol after a past usage of about 2.0 standard drinks of alcohol per week. He reports current drug use. Drug: Marijuana.   Family History:  His family  history includes Kidney cancer in his sister; Prostate cancer in his brother.   Allergies Allergies  Allergen Reactions   Bee Venom Anaphylaxis    syncope     Home Medications  Prior to Admission medications   Medication Sig Start Date End Date Taking? Authorizing Provider  albuterol  (VENTOLIN  HFA) 108 (90 Base) MCG/ACT inhaler Inhale 1 puff into the lungs every 4 (four) hours as needed for wheezing. 01/02/24 01/01/25  Tobie Yetta HERO, MD  binimetinib  (MEKTOVI ) 15 MG tablet Take 3 tablets (45 mg total) by mouth 2 (two) times daily. 04/12/24   Kandala, Hyndavi, MD  budesonide -glycopyrrolate -formoterol  (BREZTRI  AEROSPHERE) 160-9-4.8 MCG/ACT AERO inhaler Inhale 2 puffs into the lungs in the morning and at bedtime. 01/02/24   Patel, Pranav M, MD  cetirizine (ZYRTEC) 10 MG tablet Take 10 mg by mouth in the morning.    [provider]  citalopram  (CELEXA ) 20 MG tablet Take 20 mg by mouth in the morning.    [provider]  encorafenib  (BRAFTOVI ) 75 MG capsule Take 6 capsules (450 mg total) by mouth daily. 04/12/24   Davonna Siad, MD  EPINEPHrine  0.3 mg/0.3 mL IJ SOAJ injection Inject 0.3 mg into the muscle as needed for anaphylaxis. 02/14/24   [provider]  metoprolol  tartrate (LOPRESSOR ) 50 MG tablet Take 1 tablet (50 mg total) by mouth 2 (two) times daily. 04/29/24   Mallipeddi, Vishnu P, MD  prochlorperazine  (COMPAZINE ) 10 MG tablet TAKE 1 TABLET(10 MG) BY MOUTH EVERY 6 HOURS AS NEEDED 02/23/24   Rogers Hai, MD  warfarin (COUMADIN ) 10 MG tablet Or as directed by coumadin  clinic.  Take 5 mg MWF and 10 mg all other days 05/03/24   Evonnie Lenis, MD       Valinda Novas, MD [facility] See Amion for pager If no response to pager, please call [phone] until 7pm After 7pm, Please call E-link [phone]

## 2024-05-09 NOTE — Consult Note (Addendum)
 Cardiology Consultation   Patient ID: [name] MRN: [id number]; DOB: [birth year]  Admit date: 05/09/2024 Date of Consult: 05/09/2024  PCP:  Sheryle Carwin, MD   [facility] Providers Cardiologist:  Diannah SHAUNNA Maywood, MD    Patient Profile: Jose Joseph is a 68 y.o. male with a hx of paroxysmal atrial fibrillation (initially diagnosed 12/2023 s/p DC DCCV/TEE in 12/2023, recurrent afib w/ successful DCCV 05/01/2024 ), HTN, Hx of pericardial effusion (echo 12/26/2023 large; 12/30/2023 small; 05/01/2024 resolved), COPD/asthma, depression, metastatic right lung cancer, history of CVA who is being seen 05/09/2024 for the evaluation of chest pain at the request of Dr. Maree.  History of Present Illness: Mr. Dimmick was last seen in cardiology consult 9/17-18/2025 for A-fib and was cardioverted in ED. He was admitted for further evaluation and treatment of SIRS/hypotension.  Treated with IV ABX.  Noted BP improved after IV fluids and conversion to NSR.  Echo showed EF 55 to 60%, G1 DD, no pericardial effusion.  Hospital course complicated by supratherapeutic INR of 5, which improved to 2.8 on day of discharge.  Due to interaction with chemotherapy, DOACs are not preferred.  In addition amiodarone  and diltiazem  cannot be used due to drug-drug interaction.  He was continued on warfarin per Coumadin  clinic and Lopressor  50 mg twice daily  Presented to [facility] ED 05/09/2024 for chest pain. TN 320 > 452,  K3.8, CR 1.1, WBC 16.2, Hgb 12.9, INR 1.6, prothrombin 20 EKG: Afib w/ RVR, HR 123, PVC CXR with no acute findings; C/W atelectasis Treated with Cardizem  drip, Lopressor  IV 2.5 mg x1, ASA 324 mg, morphine  and NTG drip.  ED consulted with cardiology Dr. Shlomo who recommended transfer to [facility].  On arrival to room, patient was in the process of being transferred to [facility] and interview was limited.  Patient reported developing chest pain at 3:30 AM this morning while sitting in recliner, described  as dull, 7/10, located midsternal, radiating to left arm.  Notes to be first episode of chest pain.  Associated with diaphoresis, lightheadedness, and dizziness.  CP improved at hospital once treated with morphine  and NTG.  Denied any SOB, orthopnea, palpitations, nausea/vomiting, edema, syncope.  Patient denies ever feeling atrial fibrillation and still does not.  Reports daily medication compliance.  Activity is limited due to back pain associated with sciatica nerve.   Past Medical History:  Diagnosis Date   Arthritis    Asthma    Atrial fibrillation (HCC)    Cancer (HCC)    Complication of anesthesia    after his neck surgery pt. trying to get up and walk out   COPD (chronic obstructive pulmonary disease) (HCC)    Depression    History of kidney stones    2021   Hypertension    Metastatic lung carcinoma, right (HCC) 08/2023   Stroke [facility])     Past Surgical History:  Procedure Laterality Date   BRONCHIAL BIOPSY  11/21/2023   Procedure: BRONCHOSCOPY, WITH BIOPSY;  Surgeon: Shelah Lamar RAMAN, MD;  Location: [facility] ENDOSCOPY;  Service: Pulmonary;;   BRONCHIAL NEEDLE ASPIRATION BIOPSY  11/21/2023   Procedure: BRONCHOSCOPY, WITH NEEDLE ASPIRATION BIOPSY;  Surgeon: Shelah Lamar RAMAN, MD;  Location: [facility] ENDOSCOPY;  Service: Pulmonary;;   CARDIOVERSION N/A 01/01/2024   Procedure: CARDIOVERSION;  Surgeon: Francyne Headland, MD;  Location: [facility] INVASIVE CV LAB;  Service: Cardiovascular;  Laterality: N/A;   COLONOSCOPY WITH PROPOFOL  N/A 01/19/2023   Procedure: COLONOSCOPY WITH PROPOFOL ;  Surgeon: Shaaron Lamar HERO, MD;  Location: [facility] ENDO SUITE;  Service: Endoscopy;  Laterality: N/A;  200pm, asa 2   ENDOBRONCHIAL ULTRASOUND Bilateral 11/21/2023   Procedure: ENDOBRONCHIAL ULTRASOUND (EBUS);  Surgeon: Shelah Lamar RAMAN, MD;  Location: [facility] ENDOSCOPY;  Service: Pulmonary;  Laterality: Bilateral;   HERNIA REPAIR     IR IMAGING GUIDED PORT INSERTION  12/14/2023   IR THORACENTESIS ASP PLEURAL SPACE W/IMG GUIDE   12/28/2023   KNEE ARTHROCENTESIS     NECK SURGERY     POLYPECTOMY  01/19/2023   Procedure: POLYPECTOMY;  Surgeon: Shaaron Lamar HERO, MD;  Location: [facility] ENDO SUITE;  Service: Endoscopy;;   TOTAL HIP ARTHROPLASTY Left 01/15/2021   Procedure: LEFT TOTAL HIP ARTHROPLASTY ANTERIOR APPROACH;  Surgeon: Barbarann Oneil BROCKS, MD;  Location: [facility] OR;  Service: Orthopedics;  Laterality: Left;   TRANSESOPHAGEAL ECHOCARDIOGRAM (CATH LAB) N/A 01/01/2024   Procedure: TRANSESOPHAGEAL ECHOCARDIOGRAM;  Surgeon: Francyne Headland, MD;  Location: [facility] INVASIVE CV LAB;  Service: Cardiovascular;  Laterality: N/A;   VIDEO BRONCHOSCOPY WITH ENDOBRONCHIAL NAVIGATION Bilateral 11/21/2023   Procedure: VIDEO BRONCHOSCOPY WITH ENDOBRONCHIAL NAVIGATION;  Surgeon: Shelah Lamar RAMAN, MD;  Location: [facility] ENDOSCOPY;  Service: Pulmonary;  Laterality: Bilateral;   WRIST ARTHROPLASTY       Home Medications:  Prior to Admission medications   Medication Sig Start Date End Date Taking? Authorizing Provider  albuterol  (VENTOLIN  HFA) 108 (90 Base) MCG/ACT inhaler Inhale 1 puff into the lungs every 4 (four) hours as needed for wheezing. 01/02/24 01/01/25  Tobie Yetta HERO, MD  binimetinib  (MEKTOVI ) 15 MG tablet Take 3 tablets (45 mg total) by mouth 2 (two) times daily. 04/12/24   Kandala, Hyndavi, MD  budesonide -glycopyrrolate -formoterol  (BREZTRI  AEROSPHERE) 160-9-4.8 MCG/ACT AERO inhaler Inhale 2 puffs into the lungs in the morning and at bedtime. 01/02/24   Patel, Pranav M, MD  cetirizine (ZYRTEC) 10 MG tablet Take 10 mg by mouth in the morning.    [provider]  citalopram  (CELEXA ) 20 MG tablet Take 20 mg by mouth in the morning.    [provider]  encorafenib  (BRAFTOVI ) 75 MG capsule Take 6 capsules (450 mg total) by mouth daily. 04/12/24   Davonna Siad, MD  EPINEPHrine  0.3 mg/0.3 mL IJ SOAJ injection Inject 0.3 mg into the muscle as needed for anaphylaxis. 02/14/24   [provider]  metoprolol  tartrate (LOPRESSOR ) 50 MG  tablet Take 1 tablet (50 mg total) by mouth 2 (two) times daily. 04/29/24   Mallipeddi, Vishnu P, MD  prochlorperazine  (COMPAZINE ) 10 MG tablet TAKE 1 TABLET(10 MG) BY MOUTH EVERY 6 HOURS AS NEEDED 02/23/24   Rogers Hai, MD  warfarin (COUMADIN ) 10 MG tablet Or as directed by coumadin  clinic.  Take 5 mg MWF and 10 mg all other days 05/03/24   Evonnie Lenis, MD    Scheduled Meds:  Continuous Infusions:  diltiazem  (CARDIZEM ) infusion 15 mg/hr (05/09/24 0714)   nitroGLYCERIN  5 mcg/min (05/09/24 0700)   PRN Meds:   Allergies:    Allergies  Allergen Reactions   Bee Venom Anaphylaxis    syncope    Social History:   Social History   Socioeconomic History   Marital status: Married    Spouse name: Not on file   Number of children: 2   Years of education: Not on file   Highest education level: Not on file  Occupational History   Occupation: retired  Tobacco Use   Smoking status: Former    Current packs/day: 0.00    Average packs/day: 1 pack/day for 35.0 years (35.0 ttl pk-yrs)  Types: Cigarettes    Start date: 73    Quit date: 2014    Years since quitting: 11.7    Passive exposure: Past   Smokeless tobacco: Never  Vaping Use   Vaping status: Never Used  Substance and Sexual Activity   Alcohol use: Not Currently    Alcohol/week: 2.0 standard drinks of alcohol    Types: 2 Cans of beer per week   Drug use: Yes    Types: Marijuana    Comment: last: 1 week ago   Sexual activity: Not on file  Other Topics Concern   Not on file  Social History Narrative   Not on file      Family History:   Family History  Problem Relation Age of Onset   Kidney cancer Sister    Prostate cancer Brother      ROS:  Please see the history of present illness.  All other ROS reviewed and negative.     Physical Exam/Data: Vitals:   05/09/24 0805 05/09/24 0810 05/09/24 0815 05/09/24 0826  BP:   119/86   Pulse:      Resp: 19 (!) 23 (!) 22   Temp:    97.9 F (36.6 C)  TempSrc:     Axillary  SpO2: 93% 93% 92%   Weight:      Height:       No intake or output data in the 24 hours ending 05/09/24 0855    05/09/2024    4:33 AM 05/01/2024    2:23 PM 05/01/2024   10:08 AM  Last 3 Weights  Weight (lbs) 266 lb 5.1 oz 266 lb 5.1 oz 262 lb  Weight (kg) 120.8 kg 120.8 kg 118.842 kg     Body mass index is 35.14 kg/m.  General:  Well nourished, well developed, in no acute distress; wearing Craig 2L HEENT: normal Neck: no JVD Vascular: No carotid bruits; Distal pulses 2+ bilaterally Cardiac:  normal S1, S2; irreg irreg; no murmur  Lungs:  clear to auscultation bilaterally, no wheezing, rhonchi or rales  Abd: soft, nontender, no hepatomegaly  Ext: no edema Musculoskeletal:  No deformities, BUE and BLE strength normal and equal Skin: warm and dry  Neuro:  CNs 2-12 intact, no focal abnormalities noted Psych:  Normal affect   EKG:  The EKG was personally reviewed and demonstrates:  Afib w/ RVR, HR 123, PVC Telemetry:  Telemetry was personally reviewed and demonstrates:  afib with RVR, HR 120-40's with PVC   Relevant CV Studies: ECHO IMPRESSIONS 05/01/2024  1. Left ventricular ejection fraction, by estimation, is 55 to 60%. The  left ventricle has normal function. The left ventricle has no regional  wall motion abnormalities. Left ventricular diastolic parameters are  consistent with Grade I diastolic  dysfunction (impaired relaxation).   2. Right ventricular systolic function is normal. The right ventricular  size is normal.   3. The mitral valve is normal in structure. No evidence of mitral valve  regurgitation. No evidence of mitral stenosis.   4. The aortic valve is tricuspid. There is mild calcification of the  aortic valve. There is mild thickening of the aortic valve. Aortic valve  regurgitation is not visualized. Aortic valve sclerosis/calcification is  present, without any evidence of  aortic stenosis.    Laboratory Data: High Sensitivity Troponin:    Recent Labs  Lab 05/01/24 1020 05/01/24 1231 05/09/24 0455 05/09/24 0642  TROPONINIHS 8 6 320* 452*     Chemistry Recent Labs  Lab 05/03/24 0455  05/09/24 0455  NA 136 137  K 3.7 3.8  CL 105 100  CO2 23 23  GLUCOSE 109* 110*  BUN 20 28*  CREATININE 0.93 1.11  CALCIUM 8.8* 9.3  MG 2.0  --   GFRNONAA >60 >60  ANIONGAP 8 14    No results for input(s): PROT, ALBUMIN , AST, ALT, ALKPHOS, BILITOT in the last 168 hours. Lipids No results for input(s): CHOL, TRIG, HDL, LABVLDL, LDLCALC, CHOLHDL in the last 168 hours.  Hematology Recent Labs  Lab 05/03/24 0455 05/09/24 0455  WBC 12.2* 16.2*  RBC 4.12* 4.38  HGB 12.3* 12.9*  HCT 37.3* 39.8  MCV 90.5 90.9  MCH 29.9 29.5  MCHC 33.0 32.4  RDW 12.7 13.0  PLT 218 176   Thyroid No results for input(s): TSH, FREET4 in the last 168 hours.  BNPNo results for input(s): BNP, PROBNP in the last 168 hours.  DDimer No results for input(s): DDIMER in the last 168 hours.  Radiology/Studies:  DG Chest Port 1 View Result Date: 05/09/2024 EXAM: 1 VIEW(S) XRAY OF THE CHEST 05/09/2024 05:02:00 AM COMPARISON: 05/01/2024 CLINICAL HISTORY: Tachycardia, chest pain radiates down LT arm region, profuse sweating, severe pain from 4 up to 9, slight dizziness FINDINGS: LINES, TUBES AND DEVICES: Right Port-A-Cath in place with tip overlying midportion of superior vena cava, unchanged. LUNGS AND PLEURA: Low lung volume. Redemonstration of linear atelectasis/scarring overlying right mid lung zone. Additional linear atelectasis/scarring in left lung. No pulmonary edema. No pleural effusion. No pneumothorax. HEART AND MEDIASTINUM: No acute abnormality of the cardiac and mediastinal silhouettes. BONES AND SOFT TISSUES: No acute osseous abnormality. IMPRESSION: 1. No acute findings. 2. Low lung volume with linear atelectasis/scarring in the right mid lung zone and left lung. Electronically signed by: Evalene Coho MD 05/09/2024  05:34 AM EDT RP Workstation: [id number]     Assessment and Plan: Paroxysmal atrial fibrillation Initially diagnosed 12/2023 s/p DCCV/TEE in 12/2023 Recurrent A-fib 9/17 s/p DCCV in ED in the setting of systemic illness/SIRS Echo 05/01/2024 showed EF 55 to 60%, G1DD, normal size L/R atria Denies ever being able to feel palpitations. EKG: Afib w/ RVR, HR 123, PVC Telemetry shows A-fib with RVR HR 120s to 140s. Treated with Cardizem  drip, Lopressor  IV 2.5 mg x1.  Currently still on Cardizem  drip, however would consider discontinuing per hematology note below.  Per Dr. Alvan with limited rate control, could consider IV esmolol.  Per hematology note 03/21/2024, Not a candidate for Cardizem /amiodarone /DOAC's due to Braftovi  and Mektovi .  Per Clinical Key, no interaction found with digoxin and chemo meds. Can consider digoxin as alternative if BP remains stable.  With recent cardioversion on 9/17, suspect repeat DCCV would be unsuccessful at this point.  Consider EP referral.  Normally on Coumadin  but not on any anticoagulation at this point. INR 1.6  now. Do not see pharmacology consult pending. Ordered Heparin  per pharmacy consult.  Patient was transferred to [facility]. Will notify cardiology team.   Chest pain Elevated troponin No history of ischemic evaluation per chart review.  Presented with constant dull chest pain radiating to left arm.  Associated with diaphoresis, dizziness, lightheadedness Currently,  chest pain-free since ED treated with ASA 324 mg, morphine  and NTG drip.  No obvious ischemic changes on EKG. Elevated troponin 320 > 452 could be secondary to demand ischemia but would continue to follow troponin to to assess if further ischemic evaluation required.  Risk Assessment/Risk Scores:    TIMI Risk Score for Unstable Angina or Non-ST Elevation MI:  The patient's TIMI risk score is 3, which indicates a 13% risk of all cause mortality, new or recurrent myocardial infarction or need  for urgent revascularization in the next 14 days.    CHA2DS2-VASc Score = 5   This indicates a 7.2% annual risk of stroke. The patient's score is based upon: CHF History: 0 HTN History: 1 Diabetes History: 0 Stroke History: 2 Vascular Disease History: 1 Age Score: 1 Gender Score: 0    For questions or updates, please contact [facility] Please consult [email or web address] for contact info under    Signed, Lorette CINDERELLA Kapur, PA-C  05/09/2024 8:55 AM   Patient seen with PA, I formulated the plan and agree with the above note.   Patient with lung cancer being treated with Braftovi  and Mektovi .  He has had multiple episodes of AF/RVR recently that have been difficult to rate control, has been cardioverted twice.  Diltiazem  cannot be used due to direct interaction with the chemotherapy.    No chest pain or dyspnea, just feels palpitations.   General: NAD Neck: No JVD, no thyromegaly or thyroid nodule.  Lungs: Clear to auscultation bilaterally with normal respiratory effort. CV: Nondisplaced PMI.  Heart tachy,  irregular S1/S2, no S3/S4, no murmur.  No peripheral edema.  No carotid bruit.  Normal pedal pulses.  Abdomen: Soft, nontender, no hepatosplenomegaly, no distention.  Skin: Intact without lesions or rashes.  Neurologic: Alert and oriented x 3.  Psych: Normal affect. Extremities: No clubbing or cyanosis.  HEENT: Normal.   Recurrent AF/RVR, echo on 9/17 showed EF 55-60% with normal RV.  Reviewed chemo meds with pharmacy, we cannot use diltiazem  but other agents are acceptable.  We started amiodarone  gtt for rate control, will have to monitor QTc closely.  HR still in 110s.  - Continue amiodarone  gtt - Heparin  gtt with INR 1.6, will restart warfarin.  - With difficult to control rate, will plan TEE-guided DCCV tomorrow.  Hopefully, amiodarone  will keep him in NSR. Discussed risks/benefits of procedure with patient and he is agreeable.   Mild troponin elevation with no  trend and no chest pain.  Suspect demand ischemia with AF/RVR for a prolonged period of time.  Echo last week showed EF 55-60%.  Doubt ACS.   [name] 05/09/2024 3:21 PM

## 2024-05-09 NOTE — Progress Notes (Addendum)
 PHARMACY - ANTICOAGULATION CONSULT NOTE  Pharmacy Consult for heparin  Indication: atrial fibrillation  Allergies  Allergen Reactions   Bee Venom Anaphylaxis    syncope    Patient Measurements: Height: 6' 1 (185.4 cm) Weight: 120.8 kg (266 lb 5.1 oz) IBW/kg (Calculated) : 79.9 HEPARIN  DW (KG): 106.2  Vital Signs: Temp: 97.9 F (36.6 C) (09/25 0826) Temp Source: Axillary (09/25 0826) BP: 119/86 (09/25 0815) Pulse Rate: 125 (09/25 0745)  Labs: Recent Labs    05/09/24 0455 05/09/24 0642  HGB 12.9*  --   HCT 39.8  --   PLT 176  --   LABPROT 20.1*  --   INR 1.6*  --   CREATININE 1.11  --   TROPONINIHS 320* 452*    Estimated Creatinine Clearance: 84.3 mL/min (by C-G formula based on SCr of 1.11 mg/dL).   Medical History: Past Medical History:  Diagnosis Date   Arthritis    Asthma    Atrial fibrillation (HCC)    Cancer (HCC)    Complication of anesthesia    after his neck surgery pt. trying to get up and walk out   COPD (chronic obstructive pulmonary disease) (HCC)    Depression    History of kidney stones    2021   Hypertension    Metastatic lung carcinoma, right (HCC) 08/2023   Stroke [facility])      Assessment: 68 yoM with hx AF on warfarin PTA admitted with AF RVR and CP. Pt had AF DCCV 9/18, also has hx CVA. INR on admit low at 1.6, pharmacy to dose IV heparin  for now with ongoing CP. CBC ok.  Goal of Therapy:  Heparin  level 0.3-0.7 units/ml Monitor platelets by anticoagulation protocol: Yes   Plan:  -Heparin  2500 units x1 - small bolus since PT/INR slightly prolonged -Heparin  1500 units/hr -Check 8-hr heparin  level -Monitor heparin  level, CBC, S/Sx bleeding daily  ADDENDUM 1515: restart warfarin. Home dose is 5mg  MWF, 10mg  AODs. Will give 10mg  tonight. New amio noted so may need reduced regimen longterm.   Ozell Jamaica, PharmD, BCPS, [facility] Clinical Pharmacist [phone] Please check AMION for all [facility] Pharmacy numbers 05/09/2024

## 2024-05-09 NOTE — ED Triage Notes (Signed)
 Pt c/c of chest pain going down his left arm. Hx of Afib and was cardioverted last week to put him back in rhythm. Chest pain started 35-40 minutes ago. Pain rated 5/10.

## 2024-05-09 NOTE — Consult Note (Signed)
 ELECTROPHYSIOLOGY CONSULT NOTE    Patient ID: Jose Joseph MRN: [id number], DOB/AGE: 1956-08-03 68 y.o.  Admit date: 05/09/2024 Date of Consult: 05/09/2024  Primary Physician: Sheryle Carwin, MD Primary Cardiologist: Vishnu P Mallipeddi, MD  Electrophysiologist: Dr. Nancey   Referring Provider: Dr. Rolan  Patient Profile: Jose Joseph is a 68 y.o. male with a history of paroxysmal atrial fibrillation (diagnosed May 2025), HFpEF, hypertension, metastatic adenocarcinoma of lung, COPD, who is being seen today for the evaluation of recurrent afib with RVR at the request of Dr. Rolan.  HPI:  Jose Joseph is a 68 y.o. male presented to [facility] ED with chest pain. On triage, found to be in afib with RVR, ventricular rates >140s. Labs showed troponin 320->452. INR 1.6.  Patient was started on Diltiazem  infusion for rate control, also placed on NTG infusion due to refractory chest pain. Cardiology consulted on patient at [facility] and decision made to transfer to [facility] for definitive management.  Of note, patient was recently admitted 09/17-09/19, with shortness of breath and fatigue. He was found to be in afib with RVR and had cardioversion in the ED. Patient symptomatically improved with restoration of NSR. TTE that admission with LVEF 55-60%. Hospital course complicated by supratherapeutic INR of 5, which improved to 2.8 on day of discharge.   In both current admission and prior admission, there was significant concern regarding interaction potential between Cardizem , Amiodarone , DOACs, and patient's Braftovi  and Mektovi . Upon transfer to [facility], patient remained on Diltiazem  for rate control. Given complexity of rate/rhythm management, EP consulted.   Today patient confirms above. Denies previously associating any symptoms with afib until yesterday when he had chest pain with RVR. Symptoms notably improved with better rate control.   Labs Potassium3.8 (09/25 0455)   Creatinine, ser  1.11 (09/25  0455) PLT  176 (09/25 0455) HGB  12.9* (09/25 0455) WBC 16.2* (09/25 0455) Troponin I (High Sensitivity)452* (09/25 9357).    Past Medical History:  Diagnosis Date   Arthritis    Asthma    Atrial fibrillation (HCC)    Cancer (HCC)    Complication of anesthesia    after his neck surgery pt. trying to get up and walk out   COPD (chronic obstructive pulmonary disease) (HCC)    Depression    History of kidney stones    2021   Hypertension    Metastatic lung carcinoma, right (HCC) 08/2023   Stroke [facility])      Surgical History:  Past Surgical History:  Procedure Laterality Date   BRONCHIAL BIOPSY  11/21/2023   Procedure: BRONCHOSCOPY, WITH BIOPSY;  Surgeon: Shelah Lamar RAMAN, MD;  Location: [facility] ENDOSCOPY;  Service: Pulmonary;;   BRONCHIAL NEEDLE ASPIRATION BIOPSY  11/21/2023   Procedure: BRONCHOSCOPY, WITH NEEDLE ASPIRATION BIOPSY;  Surgeon: Shelah Lamar RAMAN, MD;  Location: [facility] ENDOSCOPY;  Service: Pulmonary;;   CARDIOVERSION N/A 01/01/2024   Procedure: CARDIOVERSION;  Surgeon: Francyne Headland, MD;  Location: [facility] INVASIVE CV LAB;  Service: Cardiovascular;  Laterality: N/A;   COLONOSCOPY WITH PROPOFOL  N/A 01/19/2023   Procedure: COLONOSCOPY WITH PROPOFOL ;  Surgeon: Shaaron Lamar HERO, MD;  Location: [facility] ENDO SUITE;  Service: Endoscopy;  Laterality: N/A;  200pm, asa 2   ENDOBRONCHIAL ULTRASOUND Bilateral 11/21/2023   Procedure: ENDOBRONCHIAL ULTRASOUND (EBUS);  Surgeon: Shelah Lamar RAMAN, MD;  Location: [facility] ENDOSCOPY;  Service: Pulmonary;  Laterality: Bilateral;   HERNIA REPAIR     IR IMAGING GUIDED PORT INSERTION  12/14/2023   IR THORACENTESIS ASP PLEURAL SPACE W/IMG  GUIDE  12/28/2023   KNEE ARTHROCENTESIS     NECK SURGERY     POLYPECTOMY  01/19/2023   Procedure: POLYPECTOMY;  Surgeon: Shaaron Lamar HERO, MD;  Location: [facility] ENDO SUITE;  Service: Endoscopy;;   TOTAL HIP ARTHROPLASTY Left 01/15/2021   Procedure: LEFT TOTAL HIP ARTHROPLASTY ANTERIOR APPROACH;  Surgeon: Barbarann Oneil BROCKS, MD;  Location[facility]  OR;  Service: Orthopedics;  Laterality: Left;   TRANSESOPHAGEAL ECHOCARDIOGRAM (CATH LAB) N/A 01/01/2024   Procedure: TRANSESOPHAGEAL ECHOCARDIOGRAM;  Surgeon: Francyne Headland, MD;  Location: [facility] INVASIVE CV LAB;  Service: Cardiovascular;  Laterality: N/A;   VIDEO BRONCHOSCOPY WITH ENDOBRONCHIAL NAVIGATION Bilateral 11/21/2023   Procedure: VIDEO BRONCHOSCOPY WITH ENDOBRONCHIAL NAVIGATION;  Surgeon: Shelah Lamar RAMAN, MD;  Location: [facility] ENDOSCOPY;  Service: Pulmonary;  Laterality: Bilateral;   WRIST ARTHROPLASTY       Medications Prior to Admission  Medication Sig Dispense Refill Last Dose/Taking   albuterol  (VENTOLIN  HFA) 108 (90 Base) MCG/ACT inhaler Inhale 1 puff into the lungs every 4 (four) hours as needed for wheezing. 18 g 1    binimetinib  (MEKTOVI ) 15 MG tablet Take 3 tablets (45 mg total) by mouth 2 (two) times daily. 180 tablet 0    budesonide -glycopyrrolate -formoterol  (BREZTRI  AEROSPHERE) 160-9-4.8 MCG/ACT AERO inhaler Inhale 2 puffs into the lungs in the morning and at bedtime. 10.7 g 1    cetirizine (ZYRTEC) 10 MG tablet Take 10 mg by mouth in the morning.      citalopram  (CELEXA ) 20 MG tablet Take 20 mg by mouth in the morning.      encorafenib  (BRAFTOVI ) 75 MG capsule Take 6 capsules (450 mg total) by mouth daily. 180 capsule 0    EPINEPHrine  0.3 mg/0.3 mL IJ SOAJ injection Inject 0.3 mg into the muscle as needed for anaphylaxis.      metoprolol  tartrate (LOPRESSOR ) 50 MG tablet Take 1 tablet (50 mg total) by mouth 2 (two) times daily. 180 tablet 2    prochlorperazine  (COMPAZINE ) 10 MG tablet TAKE 1 TABLET(10 MG) BY MOUTH EVERY 6 HOURS AS NEEDED 60 tablet 2    warfarin (COUMADIN ) 10 MG tablet Or as directed by coumadin  clinic.  Take 5 mg MWF and 10 mg all other days       Inpatient Medications:   amiodarone   150 mg Intravenous Once   binimetinib   45 mg Oral BID   Chlorhexidine  Gluconate Cloth  6 each Topical Daily   Chlorhexidine  Gluconate Cloth  6 each Topical Daily   citalopram    20 mg Oral q AM   encorafenib   450 mg Oral QHS   insulin  aspart  0-9 Units Subcutaneous TID WC   metoprolol  tartrate  25 mg Oral Q6H   sodium chloride  flush  10-40 mL Intracatheter Q12H    Allergies:  Allergies  Allergen Reactions   Bee Venom Anaphylaxis    syncope    Family History  Problem Relation Age of Onset   Kidney cancer Sister    Prostate cancer Brother      Physical Exam: Vitals:   05/09/24 0815 05/09/24 0826 05/09/24 0922 05/09/24 1119  BP: 119/86  118/68 113/67  Pulse:   (!) 143 (!) 136  Resp: (!) 22  19   Temp:  97.9 F (36.6 C)    TempSrc:  Axillary    SpO2: 92%  94%   Weight:      Height:        GEN- NAD, A&O x 3, normal affect HEENT: Normocephalic, atraumatic Lungs- CTAB, Normal effort.  Heart- Irregularly irregular rate and rhythm, No M/G/R.  GI- Soft, NT, ND.  Extremities- No clubbing, cyanosis, or edema   Radiology/Studies: DG Chest Port 1 View Result Date: 05/09/2024 EXAM: 1 VIEW(S) XRAY OF THE CHEST 05/09/2024 05:02:00 AM COMPARISON: 05/01/2024 CLINICAL HISTORY: Tachycardia, chest pain radiates down LT arm region, profuse sweating, severe pain from 4 up to 9, slight dizziness FINDINGS: LINES, TUBES AND DEVICES: Right Port-A-Cath in place with tip overlying midportion of superior vena cava, unchanged. LUNGS AND PLEURA: Low lung volume. Redemonstration of linear atelectasis/scarring overlying right mid lung zone. Additional linear atelectasis/scarring in left lung. No pulmonary edema. No pleural effusion. No pneumothorax. HEART AND MEDIASTINUM: No acute abnormality of the cardiac and mediastinal silhouettes. BONES AND SOFT TISSUES: No acute osseous abnormality. IMPRESSION: 1. No acute findings. 2. Low lung volume with linear atelectasis/scarring in the right mid lung zone and left lung. Electronically signed by: Evalene Coho MD 05/09/2024 05:34 AM EDT RP Workstation: [id number]   ECHOCARDIOGRAM COMPLETE Result Date: 05/01/2024    ECHOCARDIOGRAM  REPORT   Patient Name:   DAESEAN LAZARZ Date of Exam: 05/01/2024 Medical Rec #:  [id number]     Height:       73.0 in Accession #:    [id number]    Weight:       262.0 lb Date of Birth:  Sep 24, 1955     BSA:          2.413 m Patient Age:    68 years      BP:           98/64 mmHg Patient Gender: M             HR:           92 bpm. Exam Location:  [facility] Procedure: 2D Echo, Cardiac Doppler, Color Doppler and Intracardiac            Opacification Agent (Both Spectral and Color Flow Doppler were            utilized during procedure). STAT ECHO Indications:    Atrial Fibrillation l48.91  History:        Patient has prior history of Echocardiogram examinations, most                 recent 04/11/2024. Stroke, Arrythmias:Atrial Fibrillation; Risk                 Factors:Former Smoker and Hypertension. Lung cancer metastatic                 to bone [facility]),                 Chronic obstructive pulmonary disease (HCC).  Sonographer:    Aida Pizza RCS Referring Phys: [phone] TRACI R TURNER IMPRESSIONS  1. Left ventricular ejection fraction, by estimation, is 55 to 60%. The left ventricle has normal function. The left ventricle has no regional wall motion abnormalities. Left ventricular diastolic parameters are consistent with Grade I diastolic dysfunction (impaired relaxation).  2. Right ventricular systolic function is normal. The right ventricular size is normal.  3. The mitral valve is normal in structure. No evidence of mitral valve regurgitation. No evidence of mitral stenosis.  4. The aortic valve is tricuspid. There is mild calcification of the aortic valve. There is mild thickening of the aortic valve. Aortic valve regurgitation is not visualized. Aortic valve sclerosis/calcification is present, without any evidence of aortic stenosis. FINDINGS  Left Ventricle: Left ventricular ejection fraction, by estimation, is  55 to 60%. The left ventricle has normal function. The left ventricle has no regional wall motion  abnormalities. The left ventricular internal cavity size was normal in size. There is  no left ventricular hypertrophy. Left ventricular diastolic parameters are consistent with Grade I diastolic dysfunction (impaired relaxation). Normal left ventricular filling pressure. Right Ventricle: The right ventricular size is normal. No increase in right ventricular wall thickness. Right ventricular systolic function is normal. Left Atrium: Left atrial size was normal in size. Right Atrium: Right atrial size was normal in size. Pericardium: There is no evidence of pericardial effusion. Mitral Valve: The mitral valve is normal in structure. No evidence of mitral valve regurgitation. No evidence of mitral valve stenosis. Tricuspid Valve: The tricuspid valve is normal in structure. Tricuspid valve regurgitation is not demonstrated. No evidence of tricuspid stenosis. Aortic Valve: The aortic valve is tricuspid. There is mild calcification of the aortic valve. There is mild thickening of the aortic valve. Aortic valve regurgitation is not visualized. Aortic valve sclerosis/calcification is present, without any evidence of aortic stenosis. Pulmonic Valve: The pulmonic valve was normal in structure. Pulmonic valve regurgitation is not visualized. No evidence of pulmonic stenosis. Aorta: The aortic root is normal in size and structure. IAS/Shunts: The interatrial septum appears to be lipomatous. No atrial level shunt detected by color flow Doppler.  LEFT VENTRICLE PLAX 2D LVIDd:         5.10 cm   Diastology LVIDs:         3.50 cm   LV e' medial:    7.96 cm/s LV PW:         1.00 cm   LV E/e' medial:  5.8 LV IVS:        0.90 cm   LV e' lateral:   11.30 cm/s LVOT diam:     2.10 cm   LV E/e' lateral: 4.1 LV SV:         59 LV SV Index:   24 LVOT Area:     3.46 cm  RIGHT VENTRICLE RV S prime:     13.40 cm/s TAPSE (M-mode): 2.3 cm LEFT ATRIUM             Index        RIGHT ATRIUM           Index LA diam:        3.70 cm 1.53 cm/m   RA  Area:     16.00 cm LA Vol (A2C):   71.3 ml 29.54 ml/m  RA Volume:   41.60 ml  17.24 ml/m LA Vol (A4C):   51.5 ml 21.34 ml/m LA Biplane Vol: 61.1 ml 25.32 ml/m  AORTIC VALVE LVOT Vmax:   97.33 cm/s LVOT Vmean:  63.433 cm/s LVOT VTI:    0.169 m  AORTA Ao Root diam: 3.70 cm MITRAL VALVE MV Area (PHT): 4.49 cm    SHUNTS MV Decel Time: 169 msec    Systemic VTI:  0.17 m MV E velocity: 46.30 cm/s  Systemic Diam: 2.10 cm MV A velocity: 66.90 cm/s MV E/A ratio:  0.69 Wilbert Bihari MD Electronically signed by Wilbert Bihari MD Signature Date/Time: 05/01/2024/2:07:26 PM    Final    DG Chest Portable 1 View Result Date: 05/01/2024 CLINICAL DATA:  Sepsis. EXAM: PORTABLE CHEST 1 VIEW COMPARISON:  12/28/2023 FINDINGS: Low volume film. Cardiopericardial silhouette is at upper limits of normal for size. Focal opacity in the parahilar right lung has decreased in the interval with residual streaky linear density  in this region, compatible with scarring as indicated on chest CT 03/11/2024. Right Port-A-Cath again noted. No acute bony abnormality. Telemetry leads overlie the chest. IMPRESSION: Low volume film without acute cardiopulmonary findings. Electronically Signed   By: Camellia Candle M.D.   On: 05/01/2024 11:26   ECHOCARDIOGRAM COMPLETE Result Date: 04/11/2024    ECHOCARDIOGRAM REPORT   Patient Name:   BUNNY LOWDERMILK Date of Exam: 04/11/2024 Medical Rec #:  [id number]     Height:       73.0 in Accession #:    [id number]    Weight:       273.6 lb Date of Birth:  Mar 11, 1956     BSA:          2.458 m Patient Age:    68 years      BP:           91/66 mmHg Patient Gender: M             HR:           70 bpm. Exam Location:  [facility] Procedure: 2D Echo, 3D Echo, Cardiac Doppler, Color Doppler and Strain Analysis            (Both Spectral and Color Flow Doppler were utilized during            procedure). Indications:    Chemo  History:        Patient has prior history of Echocardiogram examinations, most                 recent  01/26/2024. COPD, Arrythmias:Atrial Fibrillation;                 Signs/Symptoms:Dyspnea.  Sonographer:    Philomena Daring Referring Phys: [id number] ALEAN STANDS  Sonographer Comments: Global longitudinal strain was attempted. IMPRESSIONS  1. Left ventricular ejection fraction, by estimation, is 55 to 60%. The left ventricle has normal function. The left ventricle has no regional wall motion abnormalities. Left ventricular diastolic parameters are consistent with Grade I diastolic dysfunction (impaired relaxation).  2. Right ventricular systolic function is normal. The right ventricular size is normal. Tricuspid regurgitation signal is inadequate for assessing PA pressure.  3. The mitral valve is normal in structure. Trivial mitral valve regurgitation. No evidence of mitral stenosis.  4. The aortic valve is tricuspid. There is mild calcification of the aortic valve. There is mild thickening of the aortic valve. Aortic valve regurgitation is mild. No aortic stenosis is present.  5. The inferior vena cava is normal in size with greater than 50% respiratory variability, suggesting right atrial pressure of 3 mmHg. FINDINGS  Left Ventricle: Left ventricular ejection fraction, by estimation, is 55 to 60%. The left ventricle has normal function. The left ventricle has no regional wall motion abnormalities. Strain was performed and the global longitudinal strain is indeterminate. The left ventricular internal cavity size was normal in size. There is no left ventricular hypertrophy. Left ventricular diastolic parameters are consistent with Grade I diastolic dysfunction (impaired relaxation). Normal left ventricular filling pressure. Right Ventricle: The right ventricular size is normal. Right vetricular wall thickness was not well visualized. Right ventricular systolic function is normal. Tricuspid regurgitation signal is inadequate for assessing PA pressure. Left Atrium: Left atrial size was normal in size. Right Atrium:  Right atrial size was normal in size. Pericardium: There is no evidence of pericardial effusion. Mitral Valve: The mitral valve is normal in structure. Trivial mitral valve regurgitation. No evidence of mitral valve stenosis.  Tricuspid Valve: The tricuspid valve is normal in structure. Tricuspid valve regurgitation is trivial. No evidence of tricuspid stenosis. Aortic Valve: The aortic valve is tricuspid. There is mild calcification of the aortic valve. There is mild thickening of the aortic valve. There is mild aortic valve annular calcification. Aortic valve regurgitation is mild. Aortic regurgitation PHT measures 673 msec. No aortic stenosis is present. Aortic valve mean gradient measures 4.0 mmHg. Aortic valve peak gradient measures 7.4 mmHg. Aortic valve area, by VTI measures 3.32 cm. Pulmonic Valve: The pulmonic valve was not well visualized. Pulmonic valve regurgitation is not visualized. No evidence of pulmonic stenosis. Aorta: The aortic root and ascending aorta are structurally normal, with no evidence of dilitation. Venous: The inferior vena cava is normal in size with greater than 50% respiratory variability, suggesting right atrial pressure of 3 mmHg. IAS/Shunts: No atrial level shunt detected by color flow Doppler.  LEFT VENTRICLE PLAX 2D LVIDd:         5.40 cm   Diastology LVIDs:         3.90 cm   LV e' medial:    7.51 cm/s LV PW:         0.80 cm   LV E/e' medial:  4.7 LV IVS:        0.80 cm   LV e' lateral:   7.18 cm/s LVOT diam:     2.30 cm   LV E/e' lateral: 4.9 LV SV:         81 LV SV Index:   33 LVOT Area:     4.15 cm                           3D Volume EF:                          3D EF:        51 %                          LV EDV:       152 ml                          LV ESV:       75 ml                          LV SV:        77 ml RIGHT VENTRICLE             IVC RV Basal diam:  2.40 cm     IVC diam: 1.30 cm RV Mid diam:    1.70 cm RV S prime:     10.40 cm/s TAPSE (M-mode): 1.7 cm LEFT ATRIUM              Index        RIGHT ATRIUM           Index LA diam:        4.00 cm 1.63 cm/m   RA Area:     14.60 cm LA Vol (A2C):   56.4 ml 22.94 ml/m  RA Volume:   26.20 ml  10.66 ml/m LA Vol (A4C):   44.4 ml 18.06 ml/m LA Biplane Vol: 49.9 ml 20.30 ml/m  AORTIC VALVE AV Area (Vmax):    3.02 cm AV Area (  Vmean):   2.94 cm AV Area (VTI):     3.32 cm AV Vmax:           136.36 cm/s AV Vmean:          93.072 cm/s AV VTI:            0.243 m AV Peak Grad:      7.4 mmHg AV Mean Grad:      4.0 mmHg LVOT Vmax:         99.10 cm/s LVOT Vmean:        65.900 cm/s LVOT VTI:          0.194 m LVOT/AV VTI ratio: 0.80 AI PHT:            673 msec  AORTA Ao Root diam: 3.30 cm Ao Asc diam:  3.20 cm MITRAL VALVE MV Area (PHT): 2.69 cm    SHUNTS MV Decel Time: 282 msec    Systemic VTI:  0.19 m MV E velocity: 35.00 cm/s  Systemic Diam: 2.30 cm MV A velocity: 60.30 cm/s MV E/A ratio:  0.58 Dorn Ross MD Electronically signed by Dorn Ross MD Signature Date/Time: 04/11/2024/12:01:34 PM    Final     EKG: afib with rapid ventricular response, ventricular rate ~123bpm (personally reviewed)  TELEMETRY: persistent afib this admission with ventricular rates 120s-150s before initiation of amiodarone . Ventricular rates now improved, 100-120bpm since starting Amiodarone  (personally reviewed)  DEVICE HISTORY: N/A  Assessment/Plan:  Paroxysmal atrial fibrillation Secondary hypercoagulable state Patient first diagnosed with afib in May of this year. S/P DCCV during recent admission, now found with recurrent afib with RVR. In both previous and current admissions, significant concern regarding medication interactions between rate/rhythm agents and patient's Braftovi  and Mektovi . Upon further discussion with pharmacy and patient's oncologist, appears the primary concern with Amiodarone  and these agents is prolonged Qtc rather than pharmacokinetic interaction.  Given otherwise very limited options for rate control, Amiodarone   appears to be the best option available. Discussed with patient that while there is small potential for chemical cardioversion on Amio, overall likelihood of this is low and with potential for hemodynamic collapse with sustained RVR, benefit>risk Tentative plans for TEE/DCCV this admission per CHF team.  Continue Lopressor  25mg  Q6hr.  Chronic HFpEF TTE this admission with preserved LVEF. Appears euvolemic.   Chest pain Demand ischemia Patient without CAD hx having chest pain in setting of afib with RVR. Troponin 320->452 is most consistent with demand ischemia. ECG without focal ischemic change.     For questions or updates, please contact [facility] Please consult [email or web address] for contact info under     Signed, Artist Pouch, PA-C  05/09/2024, 11:35 AM

## 2024-05-09 NOTE — ED Provider Notes (Signed)
 [place] EMERGENCY DEPARTMENT AT [facility] Provider Note   CSN: [id number] Arrival date & time: 05/09/24  0421     Patient presents with: Chest Pain   SPARSH CALLENS is a 68 y.o. male.   Patient is a 68 year old male with history of paroxysmal atrial fibrillation, lung cancer on oral chemo, pericardial effusion, COPD.  Patient presenting today with complaints of chest pain.  Symptoms began earlier this evening and are worsening.  He denies shortness of breath, nausea, or diaphoresis.  He does report some radiation into his left arm.  No fevers or chills.  Patient reports being cardioverted last week, but did not describe pain as a symptom during that episode.       Prior to Admission medications   Medication Sig Start Date End Date Taking? Authorizing Provider  albuterol  (VENTOLIN  HFA) 108 (90 Base) MCG/ACT inhaler Inhale 1 puff into the lungs every 4 (four) hours as needed for wheezing. 01/02/24 01/01/25  Tobie Yetta HERO, MD  binimetinib  (MEKTOVI ) 15 MG tablet Take 3 tablets (45 mg total) by mouth 2 (two) times daily. 04/12/24   Kandala, Hyndavi, MD  budesonide -glycopyrrolate -formoterol  (BREZTRI  AEROSPHERE) 160-9-4.8 MCG/ACT AERO inhaler Inhale 2 puffs into the lungs in the morning and at bedtime. 01/02/24   Patel, Pranav M, MD  cetirizine (ZYRTEC) 10 MG tablet Take 10 mg by mouth in the morning.    [provider]  citalopram  (CELEXA ) 20 MG tablet Take 20 mg by mouth in the morning.    [provider]  encorafenib  (BRAFTOVI ) 75 MG capsule Take 6 capsules (450 mg total) by mouth daily. 04/12/24   Davonna Siad, MD  EPINEPHrine  0.3 mg/0.3 mL IJ SOAJ injection Inject 0.3 mg into the muscle as needed for anaphylaxis. 02/14/24   [provider]  metoprolol  tartrate (LOPRESSOR ) 50 MG tablet Take 1 tablet (50 mg total) by mouth 2 (two) times daily. 04/29/24   Mallipeddi, Vishnu P, MD  prochlorperazine  (COMPAZINE ) 10 MG tablet TAKE 1 TABLET(10 MG) BY MOUTH  EVERY 6 HOURS AS NEEDED 02/23/24   Rogers Hai, MD  warfarin (COUMADIN ) 10 MG tablet Or as directed by coumadin  clinic.  Take 5 mg MWF and 10 mg all other days 05/03/24   Evonnie Lenis, MD    Allergies: Bee venom    Review of Systems  All other systems reviewed and are negative.   Updated Vital Signs Temp 98.4 F (36.9 C) (Oral)   Ht 6' 1 (1.854 m)   Wt 120.8 kg   BMI 35.14 kg/m   Physical Exam Vitals and nursing note reviewed.  Constitutional:      General: He is not in acute distress.    Appearance: He is well-developed. He is not diaphoretic.  HENT:     Head: Normocephalic and atraumatic.  Cardiovascular:     Rate and Rhythm: Tachycardia present. Rhythm irregular.     Heart sounds: No murmur heard.    No friction rub.  Pulmonary:     Effort: Pulmonary effort is normal. No respiratory distress.     Breath sounds: Normal breath sounds. No wheezing or rales.  Abdominal:     General: Bowel sounds are normal. There is no distension.     Palpations: Abdomen is soft.     Tenderness: There is no abdominal tenderness.  Musculoskeletal:        General: Normal range of motion.     Cervical back: Normal range of motion and neck supple.  Skin:    General:  Skin is warm and dry.  Neurological:     Mental Status: He is alert and oriented to person, place, and time.     Coordination: Coordination normal.     (all labs ordered are listed, but only abnormal results are displayed) Labs Reviewed  BASIC METABOLIC PANEL WITH GFR  CBC  PROTIME-INR  TROPONIN I (HIGH SENSITIVITY)    EKG: ED ECG REPORT   Date: 05/09/2024  Rate: 139  Rhythm: atrial fibrillation  QRS Axis: normal  Intervals: normal  ST/T Wave abnormalities: normal  Conduction Disutrbances:none  Narrative Interpretation:   Old EKG Reviewed: none available    I have personally reviewed the EKG tracing and agree with the computerized printout as noted.   Radiology: No results found.   Procedures    Medications Ordered in the ED - No data to display                                  Medical Decision Making Amount and/or Complexity of Data Reviewed Labs: ordered.  Risk OTC drugs. Prescription drug management. Decision regarding hospitalization.   Patient is a 68 year old male presenting with complaints of chest pain.  Patient arrives here with an irregularly irregular and rapid heartbeat, but physical exam is basically unremarkable otherwise.  Laboratory studies obtained including CBC, metabolic panel, and troponin.  White count is 16.2 and troponin is 320, but laboratory studies otherwise unremarkable.  His INR is 1.6.  Chest x-ray showing no acute findings.  Patient has been started on a Cardizem  drip as he arrived in A-fib with RVR.  He was given morphine  for pain, then ultimately started on a nitroglycerin  drip.  Care was discussed with Dr. Shlomo from cardiology.  She is in agreement with me that this patient should be transferred to [facility] for cardiology evaluation.  His troponin elevation may be demand ischemia, but he is having ongoing chest pain despite the above measures.  I have spoken with Dr. Maree in the ICU who agrees to accept in transfer.  CRITICAL CARE Performed by: Vicenta Able Total critical care time: 35 minutes Critical care time was exclusive of separately billable procedures and treating other patients. Critical care was necessary to treat or prevent imminent or life-threatening deterioration. Critical care was time spent personally by me on the following activities: development of treatment plan with patient and/or surrogate as well as nursing, discussions with consultants, evaluation of patient's response to treatment, examination of patient, obtaining history from patient or surrogate, ordering and performing treatments and interventions, ordering and review of laboratory studies, ordering and review of radiographic studies, pulse oximetry and  re-evaluation of patient's condition.      Final diagnoses:  None    ED Discharge Orders     None          Able Vicenta, MD 05/09/24 [phone]

## 2024-05-09 NOTE — ED Notes (Addendum)
 [facility] here at [facility]. VSS. No changes. Alert, NAD, calm, interactive. Denies pain.

## 2024-05-09 NOTE — TOC Initial Note (Signed)
 Transition of Care [facility]) - Initial/Assessment Note    Patient Details  Name: Jose Joseph MRN: [id number] Date of Birth: 01-16-1956  [facility] CM/SW Contact:    Jose Delcia Czar, RN Phone Number: 05/09/2024, 5:06 PM  Clinical Narrative:      Readmission             Spoke to pt and states he is independent pta. Uses cane more lately due to back pain. Requesting [employer]. Contacted Jose Joseph, [employer] rep for [employer]. Will need PCP follow up appt at dc due to readmission.   Will evaluate for [facility] needs. Wife transport to appt and will provide transportation home.     Expected Discharge Plan: Home/Self Care Barriers to Discharge: Continued Medical Work up   Patient Goals and CMS Choice Patient states their goals for this hospitalization and ongoing recovery are:: wantst to recover and get better          Expected Discharge Plan and Services   Discharge Planning Services: CM Consult Post Acute Care Choice: Durable Medical Equipment Living arrangements for the past 2 months: Single Family Home                                      Prior Living Arrangements/Services Living arrangements for the past 2 months: Single Family Home Lives with:: Spouse Patient language and need for interpreter reviewed:: Yes Do you feel safe going back to the place where you live?: Yes      Need for Family Participation in Patient Care: Yes (Comment) Care giver support system in place?: Yes (comment) Current home services: DME (quad cane) Criminal Activity/Legal Involvement Pertinent to Current Situation/Hospitalization: No - Comment as needed  Activities of Daily Living   ADL Screening (condition at time of admission) Independently performs ADLs?: No Does the patient have a NEW difficulty with bathing/dressing/toileting/self-feeding that is expected to last >3 days?: Yes (Initiates electronic notice to provider for possible OT consult) Does the patient have a NEW  difficulty with getting in/out of bed, walking, or climbing stairs that is expected to last >3 days?: Yes (Initiates electronic notice to provider for possible PT consult) Does the patient have a NEW difficulty with communication that is expected to last >3 days?: No Is the patient deaf or have difficulty hearing?: No Does the patient have difficulty seeing, even when wearing glasses/contacts?: No Does the patient have difficulty concentrating, remembering, or making decisions?: No  Permission Sought/Granted Permission sought to share information with : Case Manager, Family Supports, PCP Permission granted to share information with : Yes, Verbal Permission Granted  Share Information with NAME: Jose Joseph  Permission granted to share info w AGENCY: DME, PCP  Permission granted to share info w Relationship: wife  Permission granted to share info w Contact Information: [phone]  Emotional Assessment Appearance:: Appears stated age Attitude/Demeanor/Rapport: Engaged Affect (typically observed): Accepting Orientation: : Oriented to Self, Oriented to Place, Oriented to  Time, Oriented to Situation   Psych Involvement: No (comment)  Admission diagnosis:  ACS (acute coronary syndrome) (HCC) [I24.9] Atrial fibrillation with rapid ventricular response (HCC) [I48.91] Patient Active Problem List   Diagnosis Date Noted   ACS (acute coronary syndrome) (HCC) 05/09/2024   Acquired thrombophilia 05/02/2024   Supratherapeutic INR 05/02/2024   Primary hypertension 05/01/2024   SIRS (systemic inflammatory response syndrome) (HCC) 05/01/2024   Persistent atrial fibrillation with RVR (HCC) 05/01/2024   Hypotensive  episode 05/01/2024   Encounter for therapeutic drug monitoring 02/19/2024   Long term current use of anticoagulant 01/18/2024   Persistent atrial fibrillation (HCC) 01/01/2024   PAF (paroxysmal atrial fibrillation) (HCC) 12/28/2023   Atrial fibrillation with RVR (HCC) 12/27/2023    Malignant neoplasm of lung (HCC) 12/27/2023   Dyspnea on exertion 12/26/2023   Pericardial effusion 12/26/2023   Hypertensive urgency 12/26/2023   Lung cancer metastatic to bone (HCC) 12/26/2023   Former smoker 12/26/2023   History of stroke 12/26/2023   Chronic obstructive pulmonary disease (HCC) 12/26/2023   COPD with acute exacerbation (HCC) 12/15/2023   Malignant neoplasm of upper lobe of right lung (HCC) 11/30/2023   Pulmonary nodules 11/21/2023   Mediastinal adenopathy 11/21/2023   Status post total replacement of left hip 01/15/2021   Elevated PSA 04/10/2020   Pain in left hip 02/26/2020   Pain in right shoulder 02/26/2020   PCP:  Jose Carwin, MD Pharmacy:   [facility] [phone] - [place], [place] - [address] AT [facility] [address] [place] [address] Phone: [phone] Fax: [phone]     Social Drivers of Health (SDOH) Social History: SDOH Screenings   Food Insecurity: No Food Insecurity (05/09/2024)  Housing: Low Risk  (05/09/2024)  Transportation Needs: No Transportation Needs (05/09/2024)  Utilities: Not At Risk (05/09/2024)  Depression (PHQ2-9): Low Risk  (05/06/2024)  Social Connections: Moderately Isolated (05/09/2024)  Tobacco Use: Medium Risk (05/09/2024)   SDOH Interventions:     Readmission Risk Interventions    05/03/2024   10:26 AM  Readmission Risk Prevention Plan  Transportation Screening Complete  HRI or Home Care Consult Complete  Social Work Consult for Recovery Care Planning/Counseling Complete  Palliative Care Screening Not Applicable  Medication Review Oceanographer) Complete

## 2024-05-10 ENCOUNTER — Encounter (HOSPITAL_COMMUNITY)
Admission: EM | Disposition: A | Discharge status: Home Health | Payer: Self-pay | Source: Home / Self Care | Attending: Internal Medicine

## 2024-05-10 ENCOUNTER — Inpatient Hospital Stay (HOSPITAL_COMMUNITY)

## 2024-05-10 ENCOUNTER — Encounter (HOSPITAL_COMMUNITY): Payer: Self-pay

## 2024-05-10 DIAGNOSIS — J449 Chronic obstructive pulmonary disease, unspecified: Secondary | ICD-10-CM | POA: Diagnosis not present

## 2024-05-10 DIAGNOSIS — I4891 Unspecified atrial fibrillation: Secondary | ICD-10-CM

## 2024-05-10 DIAGNOSIS — Z87891 Personal history of nicotine dependence: Secondary | ICD-10-CM

## 2024-05-10 DIAGNOSIS — I2489 Other forms of acute ischemic heart disease: Secondary | ICD-10-CM | POA: Diagnosis not present

## 2024-05-10 DIAGNOSIS — Z7901 Long term (current) use of anticoagulants: Secondary | ICD-10-CM | POA: Diagnosis not present

## 2024-05-10 DIAGNOSIS — C3490 Malignant neoplasm of unspecified part of unspecified bronchus or lung: Secondary | ICD-10-CM

## 2024-05-10 DIAGNOSIS — I249 Acute ischemic heart disease, unspecified: Secondary | ICD-10-CM | POA: Diagnosis not present

## 2024-05-10 DIAGNOSIS — E66811 Obesity, class 1: Secondary | ICD-10-CM | POA: Insufficient documentation

## 2024-05-10 DIAGNOSIS — I639 Cerebral infarction, unspecified: Secondary | ICD-10-CM

## 2024-05-10 DIAGNOSIS — I10 Essential (primary) hypertension: Secondary | ICD-10-CM

## 2024-05-10 LAB — BASIC METABOLIC PANEL WITH GFR
Anion gap: 10 (ref 5–15)
BUN: 25 mg/dL — ABNORMAL HIGH (ref 8–23)
CO2: 25 mmol/L (ref 22–32)
Calcium: 9.1 mg/dL (ref 8.9–10.3)
Chloride: 100 mmol/L (ref 98–111)
Creatinine, Ser: 1.07 mg/dL (ref 0.61–1.24)
GFR, Estimated: >60 mL/min (ref >60)
Glucose, Bld: 113 mg/dL — ABNORMAL HIGH (ref 70–99)
Potassium: 3.8 mmol/L (ref 3.5–5.1)
Sodium: 135 mmol/L (ref 135–145)

## 2024-05-10 LAB — ECHO TEE

## 2024-05-10 LAB — MAGNESIUM: Magnesium: 1.8 mg/dL (ref 1.7–2.4)

## 2024-05-10 LAB — CBC
HCT: 37.4 % — ABNORMAL LOW (ref 39.0–52.0)
Hemoglobin: 12 g/dL — ABNORMAL LOW (ref 13.0–17.0)
MCH: 29 pg (ref 26.0–34.0)
MCHC: 32.1 g/dL (ref 30.0–36.0)
MCV: 90.3 fL (ref 80.0–100.0)
Platelets: 176 K/uL (ref 150–400)
RBC: 4.14 MIL/uL — ABNORMAL LOW (ref 4.22–5.81)
RDW: 12.8 % (ref 11.5–15.5)
WBC: 17.4 K/uL — ABNORMAL HIGH (ref 4.0–10.5)
nRBC: 0 % (ref 0.0–0.2)

## 2024-05-10 LAB — HEPARIN LEVEL (UNFRACTIONATED)
Heparin Unfractionated: 0.26 [IU]/mL — ABNORMAL LOW (ref 0.30–0.70)
Heparin Unfractionated: 0.34 [IU]/mL (ref 0.30–0.70)

## 2024-05-10 LAB — GLUCOSE, CAPILLARY
Glucose-Capillary: 107 mg/dL — ABNORMAL HIGH (ref 70–99)
Glucose-Capillary: 124 mg/dL — ABNORMAL HIGH (ref 70–99)
Glucose-Capillary: 123 mg/dL — ABNORMAL HIGH (ref 70–99)
Glucose-Capillary: 140 mg/dL — ABNORMAL HIGH (ref 70–99)

## 2024-05-10 LAB — PROTIME-INR
INR: 2.7 — ABNORMAL HIGH (ref 0.8–1.2)
Prothrombin Time: 29.8 s — ABNORMAL HIGH (ref 11.4–15.2)

## 2024-05-10 SURGERY — TRANSESOPHAGEAL ECHOCARDIOGRAM (TEE) (CATHLAB)
Anesthesia: Monitor Anesthesia Care

## 2024-05-10 MED ORDER — ALBUMIN HUMAN 5 % IV SOLN
INTRAVENOUS | Status: AC
  Filled 2024-05-10: qty 250

## 2024-05-10 MED ORDER — ALBUMIN HUMAN 5 % IV SOLN: INTRAVENOUS | Status: DC | PRN

## 2024-05-10 MED ORDER — SODIUM CHLORIDE 0.9 % IV SOLN: INTRAVENOUS | Status: DC

## 2024-05-10 MED ORDER — ENSURE PLUS HIGH PROTEIN PO LIQD
237.0000 mL | Freq: Two times a day (BID) | ORAL | Status: DC
Start: 2024-05-11 — End: 2024-05-14
  Administered 2024-05-11 – 2024-05-14 (×5): 237 mL via ORAL

## 2024-05-10 MED ORDER — POTASSIUM CHLORIDE 20 MEQ PO PACK: 20.0000 meq | PACK | Freq: Once | ORAL | Status: DC

## 2024-05-10 MED ORDER — WARFARIN SODIUM 1 MG PO TABS
1.0000 mg | ORAL_TABLET | Freq: Once | ORAL | Status: AC
  Administered 2024-05-10: 1 mg via ORAL
  Filled 2024-05-10: qty 1

## 2024-05-10 MED ORDER — AMIODARONE LOAD VIA INFUSION
150.0000 mg | Freq: Once | INTRAVENOUS | Status: AC
  Administered 2024-05-10: 150 mg via INTRAVENOUS

## 2024-05-10 MED ORDER — FUROSEMIDE 10 MG/ML IJ SOLN
40.0000 mg | Freq: Once | INTRAMUSCULAR | Status: AC
  Administered 2024-05-10: 40 mg via INTRAVENOUS
  Filled 2024-05-10: qty 4

## 2024-05-10 MED ORDER — METOPROLOL TARTRATE 50 MG PO TABS
50.0000 mg | ORAL_TABLET | Freq: Four times a day (QID) | ORAL | Status: DC
  Administered 2024-05-10 – 2024-05-11 (×3): 50 mg via ORAL
  Filled 2024-05-10 (×3): qty 1

## 2024-05-10 MED ORDER — PHENYLEPHRINE 80 MCG/ML (10ML) SYRINGE FOR IV PUSH (FOR BLOOD PRESSURE SUPPORT)
PREFILLED_SYRINGE | INTRAVENOUS | Status: DC | PRN
  Administered 2024-05-10 (×4): 160 ug via INTRAVENOUS

## 2024-05-10 MED ORDER — METOPROLOL TARTRATE 25 MG PO TABS
25.0000 mg | ORAL_TABLET | Freq: Once | ORAL | Status: AC
  Administered 2024-05-10: 25 mg via ORAL
  Filled 2024-05-10: qty 1

## 2024-05-10 MED ORDER — POTASSIUM CHLORIDE 20 MEQ PO PACK
40.0000 meq | PACK | Freq: Once | ORAL | Status: AC
  Administered 2024-05-10: 40 meq via ORAL
  Filled 2024-05-10: qty 2

## 2024-05-10 MED ORDER — PROPOFOL 10 MG/ML IV BOLUS
INTRAVENOUS | Status: DC | PRN
  Administered 2024-05-10: 100 ug/kg/min via INTRAVENOUS
  Administered 2024-05-10: 30 mg via INTRAVENOUS
  Administered 2024-05-10 (×4): 20 mg via INTRAVENOUS
  Administered 2024-05-10: 10 mg via INTRAVENOUS

## 2024-05-10 MED ORDER — BUDESON-GLYCOPYRROL-FORMOTEROL 160-9-4.8 MCG/ACT IN AERO
2.0000 | INHALATION_SPRAY | Freq: Two times a day (BID) | RESPIRATORY_TRACT | Status: DC
  Administered 2024-05-10 – 2024-05-14 (×9): 2 via RESPIRATORY_TRACT
  Filled 2024-05-10: qty 5.9

## 2024-05-10 MED ORDER — LIDOCAINE HCL (CARDIAC) PF 100 MG/5ML IV SOSY
PREFILLED_SYRINGE | INTRAVENOUS | Status: DC | PRN
  Administered 2024-05-10: 50 mg via INTRAVENOUS

## 2024-05-10 MED ORDER — MAGNESIUM SULFATE 2 GM/50ML IV SOLN
2.0000 g | Freq: Once | INTRAVENOUS | Status: AC
  Administered 2024-05-10: 2 g via INTRAVENOUS
  Filled 2024-05-10: qty 50

## 2024-05-10 SURGICAL SUPPLY — 1 items: PAD DEFIB RADIO PHYSIO CONN (PAD) ×1 IMPLANT

## 2024-05-10 NOTE — CV Procedure (Signed)
 Procedure: TEE  Sedation: Per anesthesiology  Indication: Atrial fibrillation  Findings: Please see echo section for full report.  The patient was in atrial fibrillation with RVR.  Normal LV size with mild LV hypertrophy.  EF 55%, no wall motion abnormalities.  Normal RV size and systolic function.  Mild left atrial enlargement, no LA appendage thrombus.  Normal right atrium.  No PFO/ASD by color doppler.  No significant tricuspid regurgitation. Mild mitral regurgitation.  Trileaflet aortic valve with mild calcification, no aortic stenosis and mild aortic insufficiency.  Normal caliber thoracic aorta with mild plaque.   May proceed to DCCV.    Jose Joseph 05/10/2024 9:18 AM

## 2024-05-10 NOTE — Procedures (Signed)
 Electrical Cardioversion Procedure Note Jose Joseph [id number] 19-Sep-1955  Procedure: Electrical Cardioversion Indications:  Atrial Fibrillation  Procedure Details Consent: Risks of procedure as well as the alternatives and risks of each were explained to the (patient/caregiver).  Consent for procedure obtained. Time Out: Verified patient identification, verified procedure, site/side was marked, verified correct patient position, special equipment/implants available, medications/allergies/relevent history reviewed, required imaging and test results available.  Performed  Patient placed on cardiac monitor, pulse oximetry, supplemental oxygen  as necessary.  Sedation given: Propofol  per anesthesiology Pacer pads placed anterior and posterior chest.  Cardioverted 3 times with sternal pressure.  Cardioverted at 360J.  Evaluation Findings: Post procedure EKG shows: Atrial Fibrillation Complications: None Patient did tolerate procedure well.   [name] 05/10/2024, 9:18 AM

## 2024-05-10 NOTE — Anesthesia Postprocedure Evaluation (Signed)
 Anesthesia Post Note  Patient: Jose Joseph  Procedure(s) Performed: TRANSESOPHAGEAL ECHOCARDIOGRAM CARDIOVERSION     Patient location during evaluation: PACU Anesthesia Type: MAC Level of consciousness: awake and alert Pain management: pain level controlled Vital Signs Assessment: post-procedure vital signs reviewed and stable Respiratory status: spontaneous breathing, nonlabored ventilation, respiratory function stable and patient connected to nasal cannula oxygen  Cardiovascular status: stable and blood pressure returned to baseline Postop Assessment: no apparent nausea or vomiting Anesthetic complications: no   No notable events documented.  Last Vitals:  Vitals:   05/10/24 0940 05/10/24 0945  BP: 97/64 106/69  Pulse: (!) 110 (!) 125  Resp:    Temp:    SpO2: 91% 92%    Last Pain:  Vitals:   05/10/24 0934  TempSrc:   PainSc: 0-No pain                 Thom JONELLE Peoples

## 2024-05-10 NOTE — Assessment & Plan Note (Addendum)
 05/10/24 Went for DCCV today. Unsuccessful. Pt still in rapid afib. Remains on IV heparin  and IV amiodarone  gtts. Cards plans on continue IV amiodarone  through the weekend.  05/11/24 converted to NSR. Continue with po coumadin .  05/12/24 flipped back into rapid afib. Remains on IV amio gtts and po coumadin . EP/Cards to decide on timing of repeat trial of DCCV. K 3.8 and Mg 2.1. will give more Kcl both IV and PO.  05/13/24 Converted back to NSR last night. His DCCV was canceled. Remains on IV amiodarone  this AM when he was seen. Awaiting for him to be changed to po amiodarone  by cardiology.  05/14/24 home with amiodarone  taper 200 mg bid x 5 days then 200 mg daily. Toprol -XL 100 mg daily. Continue coumadin  10 mg qM, F. Then 5 mg on q Tu, We, Th, Sat, Sun. F/u with INR clinic.

## 2024-05-10 NOTE — Assessment & Plan Note (Signed)
 05/10/24 cardiology has deemed pt's elevated troponin due to demand ischemia. NOT NSTEMI/ACS.

## 2024-05-10 NOTE — Assessment & Plan Note (Addendum)
 05/10/24 followed by oncology.  05/11/24 stable. F/u with oncology  05/12/24 stable  05/13/24 per his CT abd/pelvis in 02-2024 he has bony mets to his sacrum. He needs to f/u with his oncologist   05/14/24 prn oxycodone  10 mg for home. Future Rx for pain by his oncologist.

## 2024-05-10 NOTE — Progress Notes (Signed)
 OT Cancellation Note  Patient Details Name: Jose Joseph MRN: [id number] DOB: November 16, 1955   Cancelled Treatment:    Reason Eval/Treat Not Completed: Patient at procedure or test/ unavailable (TEE/DCCV this am.)  Ely Molt 05/10/2024, 8:26 AM

## 2024-05-10 NOTE — Assessment & Plan Note (Addendum)
 05/10/24 chronic. Followed by [facility].  05/12/24 stable  05/13/24 per his CT abd/pelvis in 02-2024 he has bony mets to his sacrum. He needs to f/u with his oncologist  05/14/24 f/u with oncology. Prn oxycodone  for metastatic bone pain. Further refills by oncology

## 2024-05-10 NOTE — Progress Notes (Addendum)
  Patient Name: Jose Joseph Molt Date of Encounter: 05/10/2024  Primary Cardiologist: Vishnu P Mallipeddi, MD Electrophysiologist: None  Patient in sinus rhythm from approximately 15:45 on 09/25 - 05:45 am on 09/26, otherwise afib with RVR. TEE without thrombus. DCCV this morning unsuccessful in restoring sinus rhythm. Discussed with Dr. Nancey, recommend continuing IV Amiodarone  loading this weekend. Agree with plan for repeat DCCV next week if patient does not chemically convert over the weekend. At current time, patient not a candidate for ablation due to overwhelming substrate and ongoing physiologic processes that perpetuate the risk of AF and make maintenance of sinus rhythm less likely.  Outpatient EP follow up has been arranged. Please call back with questions.    For questions or updates, please contact [facility] Please consult [email or web address] for contact info under     Signed, Artist Pouch, PA-C  05/10/2024, 10:34 AM

## 2024-05-10 NOTE — Subjective & Objective (Addendum)
 Pt seen and examined. Wife at bedside. Back in afib. Remains on IV amio. Transferred to 2-C yesterday. Otherwise feeling ok. Does not feel palpitations. No chest pain. Wants to go home.

## 2024-05-10 NOTE — Assessment & Plan Note (Addendum)
 05/10/24 on coumadin  at home. Was sub-therapeutic on admission with INR 1.6. on IV heparin  currently.  05/11/24 on po coumadin . INR 2.4. IV heparin  off since yesterday evening.  05/12/24 INR 2.4 today. Remains on coumadin   05/13/24 INR 2.6 today. On coumadin .  05/14/24 Continue coumadin  10 mg qM, F. Then 5 mg on q Tu, We, Th, Sat, Sun. F/u with INR clinic.

## 2024-05-10 NOTE — Progress Notes (Signed)
 PT Cancellation Note  Patient Details Name: Jose Joseph MRN: [id number] DOB: [birth year]   Cancelled Treatment:    Reason Eval/Treat Not Completed: Medical issues which prohibited therapy (Pt in Afib with RVR after cardioversion.  Nurse asked PT to HOLD.)   [name] 05/10/2024, 12:13 PM Minetta Krisher M,PT Acute Rehab Services [phone]

## 2024-05-10 NOTE — Transfer of Care (Signed)
 Immediate Anesthesia Transfer of Care Note  Patient: Jose Joseph  Procedure(s) Performed: TRANSESOPHAGEAL ECHOCARDIOGRAM CARDIOVERSION  Patient Location: Cath Lab  Anesthesia Type:MAC  Level of Consciousness: awake, alert , oriented, and patient cooperative  Airway & Oxygen  Therapy: Patient Spontanous Breathing and Patient connected to nasal cannula oxygen   Post-op Assessment: Report given to RN and Post -op Vital signs reviewed and stable  Post vital signs: Reviewed and stable  Last Vitals:  Vitals Value Taken Time  BP 105/78 (85) 09:32  Temp    Pulse 115   Resp 16   SpO2 93%     Last Pain:  Vitals:   05/10/24 0749  TempSrc: Temporal  PainSc:          Complications: No notable events documented.

## 2024-05-10 NOTE — Plan of Care (Signed)
   Problem: Nutrition: Goal: Adequate nutrition will be maintained Outcome: Progressing   Problem: Coping: Goal: Level of anxiety will decrease Outcome: Progressing

## 2024-05-10 NOTE — Hospital Course (Addendum)
 CC: chest pain HPI: 68 year old male with a history of persistent atrial fibrillation status post DCCV May 2025, metastatic lung adenocarcinoma, COPD, hypertension, stroke, depression, chronic HFpEF who presented to [facility] emergency department with complaint of ongoing chest pain.  Patient was noted to be in A-fib with RVR with heart rate in 140s, he was started on diltiazem  infusion, he continued to have chest pain, was started on nitroglycerin  infusion, cardiology evaluated the patient, decision was made to transfer patient to [facility], ICU.  Upon arrival patient noted to be in A-fib with RVR with HR in 140's, denies chest pain palpitation, shortness of breath, headache, fever, chills or other complaints  Stated he has been losing weight for last few months due to low appetite, lost 11 pounds in the last 2 months.  Also complained of increased urinary frequency, denies dysuria  Patient reported developing chest pain at 3:30 AM this morning while sitting in recliner, described as dull, 7/10, located midsternal, radiating to left arm.  Notes to be first episode of chest pain.  Associated with diaphoresis, lightheadedness, and dizziness.  CP improved at hospital once treated with morphine  and NTG.  Denied any SOB, orthopnea, palpitations, nausea/vomiting, edema, syncope.  Patient denies ever feeling atrial fibrillation and still does not.  Reports daily medication compliance.  Activity is limited due to back pain associated with sciatica nerve.   Presented to [facility] ED 05/09/2024 for chest pain. TN 320 > 452,  K3.8, CR 1.1, WBC 16.2, Hgb 12.9, INR 1.6, prothrombin 20 EKG: Afib w/ RVR, HR 123, PVC CXR with no acute findings; C/W atelectasis Treated with Cardizem  drip, Lopressor  IV 2.5 mg x1, ASA 324 mg, morphine  and NTG drip.  ED consulted with cardiology Dr. Shlomo who recommended transfer to [facility].  Significant Events: Admitted 05/09/2024 to CICU by PCCM due to rapid afib and demand  cardiac ischemia 05-09-2024 placed on IV amiodaronge 05-10-2024 failed DCCV. Remains in rapid afib 05-08-2021 converted to NSR. Remains on IV amio. Cards gives approval to transfer to progressive care bed. 05-08-2024 pt went back into rapid afib. Continued on IV amio gtts all weekend 05-12-2024 pt converts back to NSR. DCCV canceled.  Admission Labs: WBC 16.2, HgB 12.9, plt 176 Na 137, K 3.8, CO2 of 23, BUN 28, Scr 1.11, glu 110 INR 1.6 A1c 5.2% UA small HgB, negative nitrite, negative LE, WBC 0-5, Rare bacteria Troponin I 320 >> 452  Admission Imaging Studies: CXR No acute findings. 2. Low lung volume with linear atelectasis/scarring in the right mid lung zone and left lung.  Significant Labs:   Significant Imaging Studies:   Antibiotic Therapy: Anti-infectives (From admission, onward)    None       Procedures:   Consultants: PCCM Cardiology EP/Cards

## 2024-05-10 NOTE — Progress Notes (Addendum)
 Advanced Heart Failure Rounding Note  Cardiologist: Vishnu SHAUNNA Maywood, MD  Chief Complaint:  Subjective:    On amio and heparin  gtt. Had brief conversion back to NSR yesterday and overnight but back in Afib w/ RVR this morning 120s-120s.   Felt SOB overnight. Placed on O2 via [place], currently 4L/min. O2 sats 94%.   CP resolved. Overall feels better this morning.    Objective:   Weight Range: 119.1 kg Body mass index is 34.64 kg/m.   Vital Signs:   Temp:  [97.8 F (36.6 C)-98.8 F (37.1 C)] 97.8 F (36.6 C) (09/26 0306) Pulse Rate:  [81-180] 98 (09/26 0500) Resp:  [11-38] 24 (09/26 0500) BP: (94-131)/(57-98) 114/64 (09/26 0400) SpO2:  [86 %-98 %] 98 % (09/26 0500) Weight:  [119.1 kg] 119.1 kg (09/26 0306) Last BM Date :  (PTA)  Weight change: Filed Weights   05/09/24 0433 05/10/24 0306  Weight: 120.8 kg 119.1 kg    Intake/Output:   Intake/Output Summary (Last 24 hours) at 05/10/2024 0716 Last data filed at 05/10/2024 0500 Gross per 24 hour  Intake 879.99 ml  Output 1050 ml  Net -170.01 ml      Physical Exam    Vitals:   05/10/24 0400 05/10/24 0500  BP: 114/64   Pulse: 97 98  Resp: (!) 25 (!) 24  Temp:    SpO2: 96% 98%   GENERAL: NAD Lungs- clear CARDIAC:  Irregularly irregular rate and rhythm, no MRG. No LEE  ABDOMEN: Soft, non-tender, non-distended.  EXTREMITIES: Warm and well perfused.  NEUROLOGIC: No obvious FND   Telemetry   Afib 120s-130s, personally reviewed   EKG    Afib 120s   Labs    CBC Recent Labs    05/09/24 0455 05/10/24 0429  WBC 16.2* 17.4*  HGB 12.9* 12.0*  HCT 39.8 37.4*  MCV 90.9 90.3  PLT 176 176   Basic Metabolic Panel Recent Labs    90/74/74 0455 05/10/24 0429  NA 137 135  K 3.8 3.8  CL 100 100  CO2 23 25  GLUCOSE 110* 113*  BUN 28* 25*  CREATININE 1.11 1.07  CALCIUM 9.3 9.1  MG  --  1.8   Liver Function Tests No results for input(s): AST, ALT, ALKPHOS, BILITOT, PROT, ALBUMIN  in  the last 72 hours. No results for input(s): LIPASE, AMYLASE in the last 72 hours. Cardiac Enzymes No results for input(s): CKTOTAL, CKMB, CKMBINDEX, TROPONINI in the last 72 hours.  BNP: BNP (last 3 results) Recent Labs    12/27/23 0408  BNP 71.4    ProBNP (last 3 results) No results for input(s): PROBNP in the last 8760 hours.   D-Dimer No results for input(s): DDIMER in the last 72 hours. Hemoglobin A1C Recent Labs    05/09/24 1135  HGBA1C 5.2   Fasting Lipid Panel No results for input(s): CHOL, HDL, LDLCALC, TRIG, CHOLHDL, LDLDIRECT in the last 72 hours. Thyroid Function Tests No results for input(s): TSH, T4TOTAL, T3FREE, THYROIDAB in the last 72 hours.  Invalid input(s): FREET3  Other results:   Imaging    No results found.   Medications:     Scheduled Medications:  binimetinib   45 mg Oral BID   Chlorhexidine  Gluconate Cloth  6 each Topical Daily   Chlorhexidine  Gluconate Cloth  6 each Topical Daily   citalopram   20 mg Oral q AM   encorafenib   450 mg Oral QHS   insulin  aspart  0-9 Units Subcutaneous TID WC   metoprolol  tartrate  25 mg Oral Q6H   potassium chloride   20 mEq Oral Once   sodium chloride  flush  10-40 mL Intracatheter Q12H   tamsulosin   0.4 mg Oral Daily   Warfarin - Pharmacist Dosing Inpatient   Does not apply q1600    Infusions:  amiodarone  30 mg/hr (05/10/24 0500)   heparin  2,100 Units/hr (05/10/24 0500)   magnesium  sulfate bolus IVPB      PRN Medications: mouth rinse, oxyCODONE , sodium chloride  flush     Assessment/Plan   1. Atrial Fibrillation w/ RVR - has been difficult to control. S/p multiple DCCVs w/ recurrence - now on amiodarone  gtt, continue load - INR subtherapeutic at 1.8 on admit. On heparin  gtt - plan TEE/DCCV today if no chemical conversion - supp K - suspect underlying OSA. Recommend outpatient sleep study  2. HFpEF - recent echo EF 55-60%, RV ok  - SOB overnight,  now on 4L [place]. Mild volume overload on exam - give 40 mg IV Lasix  x 1  3. Chest Pain w/ Elevated Hs Trop  - CP resolved, HS trop 320>>452. Trend c/w demand ischemia. Doubt ACS  4. Stage IV Lung Cancer - s/p radiation, on chemo - followed by oncology     Length of Stay: 1  Brittainy Simmons, PA-C  05/10/2024, 7:16 AM  Advanced Heart Failure Team Pager [phone] (M-F; 7a - 5p)  Please contact [facility] for night-coverage after hours (5p -7a ) and weekends on [email or web address]  Patient seen with PA, I formulated the plan and agree with the above note.    He converted transiently to NSR but is back in atrial fibrillation with rate 100s-110s, remains on amiodarone  gtt, heparin  gtt and warfarin. No INR this morning.  He reports increased dyspnea overnight.   General: NAD Neck: JVP 8-9, no thyromegaly or thyroid nodule.  Lungs: Clear to auscultation bilaterally with normal respiratory effort. CV: Nondisplaced PMI.  Heart mildly tachy, irregular S1/S2, no S3/S4, no murmur.  No peripheral edema.    Abdomen: Soft, nontender, no hepatosplenomegaly, no distention.  Skin: Intact without lesions or rashes.  Neurologic: Alert and oriented x 3.  Psych: Normal affect. Extremities: No clubbing or cyanosis.  HEENT: Normal.   We will increase amiodarone  gtt to 60 mg/hr.  If he remains in AF later this morning, planned for TEE-guided DCCV.  He is additionally on metoprolol  for rate control.   More short of breath with possible mild JVD, suspect HFpEF.  Will give Lasix  40 mg IV x 1 and follow.   [name] 05/10/2024 7:38 AM

## 2024-05-10 NOTE — Assessment & Plan Note (Signed)
Body mass index is 34.64 kg/m.

## 2024-05-10 NOTE — Progress Notes (Signed)
 PROGRESS NOTE    Jose Joseph  FMW:[id number] DOB: 01-02-1956 DOA: 05/09/2024 PCP: Sheryle Carwin, MD  Subjective: Pt seen and examined. Went for DCCV today. Unsuccessful. Pt still in rapid afib. Remains on IV heparin  and IV amiodarone  gtts.   Hospital Course: CC: chest pain HPI: 68 year old male with a history of persistent atrial fibrillation status post DCCV May 2025, metastatic lung adenocarcinoma, COPD, hypertension, stroke, depression, chronic HFpEF who presented to [facility] emergency department with complaint of ongoing chest pain.  Patient was noted to be in A-fib with RVR with heart rate in 140s, he was started on diltiazem  infusion, he continued to have chest pain, was started on nitroglycerin  infusion, cardiology evaluated the patient, decision was made to transfer patient to [facility], ICU.  Upon arrival patient noted to be in A-fib with RVR with HR in 140's, denies chest pain palpitation, shortness of breath, headache, fever, chills or other complaints  Stated he has been losing weight for last few months due to low appetite, lost 11 pounds in the last 2 months.  Also complained of increased urinary frequency, denies dysuria  Patient reported developing chest pain at 3:30 AM this morning while sitting in recliner, described as dull, 7/10, located midsternal, radiating to left arm.  Notes to be first episode of chest pain.  Associated with diaphoresis, lightheadedness, and dizziness.  CP improved at hospital once treated with morphine  and NTG.  Denied any SOB, orthopnea, palpitations, nausea/vomiting, edema, syncope.  Patient denies ever feeling atrial fibrillation and still does not.  Reports daily medication compliance.  Activity is limited due to back pain associated with sciatica nerve.   Presented to [facility] ED 05/09/2024 for chest pain. TN 320 > 452,  K3.8, CR 1.1, WBC 16.2, Hgb 12.9, INR 1.6, prothrombin 20 EKG: Afib w/ RVR, HR 123, PVC CXR with no acute findings; C/W  atelectasis Treated with Cardizem  drip, Lopressor  IV 2.5 mg x1, ASA 324 mg, morphine  and NTG drip.  ED consulted with cardiology Dr. Shlomo who recommended transfer to [facility].  Significant Events: Admitted 05/09/2024 to CICU by PCCM due to rapid afib and demand cardiac ischemia   Admission Labs: WBC 16.2, HgB 12.9, plt 176 Na 137, K 3.8, CO2 of 23, BUN 28, Scr 1.11, glu 110 INR 1.6 A1c 5.2% UA small HgB, negative nitrite, negative LE, WBC 0-5, Rare bacteria Troponin I 320 >> 452  Admission Imaging Studies: CXR No acute findings. 2. Low lung volume with linear atelectasis/scarring in the right mid lung zone and left lung.  Significant Labs:   Significant Imaging Studies:   Antibiotic Therapy: Anti-infectives (From admission, onward)    None       Procedures:   Consultants: PCCM Cardiology EP/Cards    Assessment and Plan: * Demand ischemia (HCC) 05/10/24 cardiology has deemed pt's elevated troponin due to demand ischemia. NOT NSTEMI/ACS.   Atrial fibrillation with RVR (HCC) 05/10/24 Went for DCCV today. Unsuccessful. Pt still in rapid afib. Remains on IV heparin  and IV amiodarone  gtts. Cards plans on continue IV amiodarone  through the weekend.   Long term current use of anticoagulant 05/10/24 on coumadin  at home. Was sub-therapeutic on admission with INR 1.6. on IV heparin  currently.   Malignant neoplasm of lung (HCC) 05/10/24 chronic. Followed by [facility].   Chronic obstructive pulmonary disease (HCC) 05/10/24 currently not exacerbated. Continue with inhalers   Obesity, Class I, BMI 30-34.9 Body mass index is 34.64 kg/m.   Lung cancer metastatic to bone (HCC) 05/10/24  followed by oncology.   DVT prophylaxis:   IV Heparin    Code Status: Prior Family Communication: no family at bedside. Pt is decisional Disposition Plan: home Reason for continuing need for hospitalization: remains on IV amiodarone  gtts and  IV heparin   gtts  Objective: Vitals:   05/10/24 1059 05/10/24 1100 05/10/24 1101 05/10/24 1200  BP:    102/64  Pulse: (!) 119 (!) 115 (!) 135 (!) 103  Resp: (!) 27 (!) 23 (!) 29 (!) 23  Temp:      TempSrc:      SpO2: 93% 94% 94% 96%  Weight:      Height:        Intake/Output Summary (Last 24 hours) at 05/10/2024 1438 Last data filed at 05/10/2024 1200 Gross per 24 hour  Intake 1529.21 ml  Output 2850 ml  Net -1320.79 ml   Filed Weights   05/09/24 0433 05/10/24 0306  Weight: 120.8 kg 119.1 kg    Examination:  Physical Exam Vitals and nursing note reviewed.  HENT:     Head: Normocephalic and atraumatic.  Eyes:     General: No scleral icterus. Cardiovascular:     Rate and Rhythm: Tachycardia present. Rhythm irregular.  Pulmonary:     Effort: Pulmonary effort is normal. No respiratory distress.     Breath sounds: Normal breath sounds.  Abdominal:     General: Bowel sounds are normal. There is no distension.     Palpations: Abdomen is soft.     Tenderness: There is no abdominal tenderness.  Musculoskeletal:     Right lower leg: No edema.     Left lower leg: No edema.     Comments: Right ant chest wall port-a-cath  Skin:    General: Skin is warm and dry.     Capillary Refill: Capillary refill takes less than 2 seconds.  Neurological:     General: No focal deficit present.     Mental Status: He is alert and oriented to person, place, and time.    Data Reviewed: I have personally reviewed following labs and imaging studies  CBC: Recent Labs  Lab 05/09/24 0455 05/10/24 0429  WBC 16.2* 17.4*  HGB 12.9* 12.0*  HCT 39.8 37.4*  MCV 90.9 90.3  PLT 176 176   Basic Metabolic Panel: Recent Labs  Lab 05/09/24 0455 05/10/24 0429  NA 137 135  K 3.8 3.8  CL 100 100  CO2 23 25  GLUCOSE 110* 113*  BUN 28* 25*  CREATININE 1.11 1.07  CALCIUM 9.3 9.1  MG  --  1.8   GFR: Estimated Creatinine Clearance: 86.9 mL/min (by C-G formula based on SCr of 1.07 mg/dL).  Coagulation  Profile: Recent Labs  Lab 05/09/24 0455 05/10/24 1250  INR 1.6* 2.7*   BNP (last 3 results) Recent Labs    12/27/23 0408  BNP 71.4   HbA1C: Recent Labs    05/09/24 1135  HGBA1C 5.2   CBG: Recent Labs  Lab 05/09/24 1203 05/09/24 1554 05/09/24 2050 05/10/24 0618 05/10/24 1226  GLUCAP 136* 100* 103* 107* 124*    Recent Results (from the past 240 hours)  Culture, blood (Routine x 2)     Status: None   Collection Time: 05/01/24 10:20 AM   Specimen: BLOOD  Result Value Ref Range Status   Specimen Description BLOOD RIGHT ANTECUBITAL  Final   Special Requests   Final    BOTTLES DRAWN AEROBIC AND ANAEROBIC Blood Culture adequate volume   Culture   Final  NO GROWTH 5 DAYS Performed at [facility], [address], [place], [place] [address]    Report Status 05/06/2024 FINAL  Final  Resp panel by RT-PCR (RSV, Flu A&B, Covid) Anterior Nasal Swab     Status: None   Collection Time: 05/01/24 10:20 AM   Specimen: Anterior Nasal Swab  Result Value Ref Range Status   SARS Coronavirus 2 by RT PCR NEGATIVE NEGATIVE Final    Comment: (NOTE) SARS-CoV-2 target nucleic acids are NOT DETECTED.  The SARS-CoV-2 RNA is generally detectable in upper respiratory specimens during the acute phase of infection. The lowest concentration of SARS-CoV-2 viral copies this assay can detect is 138 copies/mL. A negative result does not preclude SARS-Cov-2 infection and should not be used as the sole basis for treatment or other patient management decisions. A negative result may occur with  improper specimen collection/handling, submission of specimen other than nasopharyngeal swab, presence of viral mutation(s) within the areas targeted by this assay, and inadequate number of viral copies(<138 copies/mL). A negative result must be combined with clinical observations, patient history, and epidemiological information. The expected result is Negative.  Fact Sheet for Patients:   BloggerCourse.com  Fact Sheet for Healthcare Providers:  SeriousBroker.it  This test is no t yet approved or cleared by the [place]  FDA and  has been authorized for detection and/or diagnosis of SARS-CoV-2 by FDA under an Emergency Use Authorization (EUA). This EUA will remain  in effect (meaning this test can be used) for the duration of the COVID-19 declaration under Section 564(b)(1) of the Act, 21 U.S.C.section 360bbb-3(b)(1), unless the authorization is terminated  or revoked sooner.       Influenza A by PCR NEGATIVE NEGATIVE Final   Influenza B by PCR NEGATIVE NEGATIVE Final    Comment: (NOTE) The Xpert Xpress SARS-CoV-2/FLU/RSV plus assay is intended as an aid in the diagnosis of influenza from Nasopharyngeal swab specimens and should not be used as a sole basis for treatment. Nasal washings and aspirates are unacceptable for Xpert Xpress SARS-CoV-2/FLU/RSV testing.  Fact Sheet for Patients: BloggerCourse.com  Fact Sheet for Healthcare Providers: SeriousBroker.it  This test is not yet approved or cleared by the [place]  FDA and has been authorized for detection and/or diagnosis of SARS-CoV-2 by FDA under an Emergency Use Authorization (EUA). This EUA will remain in effect (meaning this test can be used) for the duration of the COVID-19 declaration under Section 564(b)(1) of the Act, 21 U.S.C. section 360bbb-3(b)(1), unless the authorization is terminated or revoked.     Resp Syncytial Virus by PCR NEGATIVE NEGATIVE Final    Comment: (NOTE) Fact Sheet for Patients: BloggerCourse.com  Fact Sheet for Healthcare Providers: SeriousBroker.it  This test is not yet approved or cleared by the [place]  FDA and has been authorized for detection and/or diagnosis of SARS-CoV-2 by FDA under an Emergency Use  Authorization (EUA). This EUA will remain in effect (meaning this test can be used) for the duration of the COVID-19 declaration under Section 564(b)(1) of the Act, 21 U.S.C. section 360bbb-3(b)(1), unless the authorization is terminated or revoked.  Performed at [facility], [address] [place], [place] [address]   Culture, blood (Routine x 2)     Status: None   Collection Time: 05/01/24 10:49 AM   Specimen: BLOOD  Result Value Ref Range Status   Specimen Description BLOOD BLOOD LEFT ARM  Final   Special Requests   Final    BOTTLES DRAWN AEROBIC AND ANAEROBIC Blood Culture adequate volume  Culture   Final    NO GROWTH 5 DAYS Performed at [facility], [address], [place], [place] [address]    Report Status 05/06/2024 FINAL  Final  MRSA Next Gen by PCR, Nasal     Status: None   Collection Time: 05/01/24  2:29 PM   Specimen: Nasal Mucosa; Nasal Swab  Result Value Ref Range Status   MRSA by PCR Next Gen NOT DETECTED NOT DETECTED Final    Comment: (NOTE) The GeneXpert MRSA Assay (FDA approved for NASAL specimens only), is one component of a comprehensive MRSA colonization surveillance program. It is not intended to diagnose MRSA infection nor to guide or monitor treatment for MRSA infections. Test performance is not FDA approved in patients less than 24 years old. Performed at [facility], [address], [place], [place] [address]   MRSA Next Gen by PCR, Nasal     Status: None   Collection Time: 05/09/24  9:35 AM   Specimen: Nasal Mucosa; Nasal Swab  Result Value Ref Range Status   MRSA by PCR Next Gen NOT DETECTED NOT DETECTED Final    Comment: (NOTE) The GeneXpert MRSA Assay (FDA approved for NASAL specimens only), is one component of a comprehensive MRSA colonization surveillance program. It is not intended to diagnose MRSA infection nor to guide or monitor treatment for MRSA infections. Test performance is not FDA approved in patients less than 7  years old. Performed at [facility], [address], [place], [place] [address]      Radiology Studies: EP STUDY Result Date: 05/10/2024 See surgical note for result.  DG Chest Port 1 View Result Date: 05/09/2024 EXAM: 1 VIEW(S) XRAY OF THE CHEST 05/09/2024 05:02:00 AM COMPARISON: 05/01/2024 CLINICAL HISTORY: Tachycardia, chest pain radiates down LT arm region, profuse sweating, severe pain from 4 up to 9, slight dizziness FINDINGS: LINES, TUBES AND DEVICES: Right Port-A-Cath in place with tip overlying midportion of superior vena cava, unchanged. LUNGS AND PLEURA: Low lung volume. Redemonstration of linear atelectasis/scarring overlying right mid lung zone. Additional linear atelectasis/scarring in left lung. No pulmonary edema. No pleural effusion. No pneumothorax. HEART AND MEDIASTINUM: No acute abnormality of the cardiac and mediastinal silhouettes. BONES AND SOFT TISSUES: No acute osseous abnormality. IMPRESSION: 1. No acute findings. 2. Low lung volume with linear atelectasis/scarring in the right mid lung zone and left lung. Electronically signed by: Timothy Berrigan MD 05/09/2024 05:34 AM EDT RP Workstation: [id number]    Scheduled Meds:  binimetinib   45 mg Oral BID   budesonide -glycopyrrolate -formoterol   2 puff Inhalation BID   Chlorhexidine  Gluconate Cloth  6 each Topical Daily   Chlorhexidine  Gluconate Cloth  6 each Topical Daily   citalopram   20 mg Oral q AM   encorafenib   450 mg Oral QHS   insulin  aspart  0-9 Units Subcutaneous TID WC   metoprolol  tartrate  50 mg Oral Q6H   sodium chloride  flush  10-40 mL Intracatheter Q12H   tamsulosin   0.4 mg Oral Daily   Warfarin - Pharmacist Dosing Inpatient   Does not apply q1600   Continuous Infusions:  albumin  human     amiodarone  60 mg/hr (05/10/24 1349)   heparin  2,100 Units/hr (05/10/24 1348)     LOS: 1 day   Time spent: 55 minutes  Camellia Door, DO  [facility]  05/10/2024, 2:38 PM

## 2024-05-10 NOTE — Progress Notes (Signed)
 PT Cancellation Note  Patient Details Name: Jose Joseph MRN: [id number] DOB: 08-31-1955   Cancelled Treatment:    Reason Eval/Treat Not Completed: Patient at procedure or test/unavailable (Pt having TEE/DCCV this am. will check back as able and if pt has no restrictions post procedure.)   [name] 05/10/2024, [name],PT Acute Rehab Services [phone]

## 2024-05-10 NOTE — Plan of Care (Signed)
  Problem: Coping: Goal: Level of anxiety will decrease Outcome: Progressing   Problem: Elimination: Goal: Will not experience complications related to bowel motility Outcome: Progressing Goal: Will not experience complications related to urinary retention Outcome: Progressing   Problem: Safety: Goal: Ability to remain free from injury will improve Outcome: Progressing   Problem: Skin Integrity: Goal: Risk for impaired skin integrity will decrease Outcome: Progressing   Problem: Education: Goal: Ability to describe self-care measures that may prevent or decrease complications (Diabetes Survival Skills Education) will improve Outcome: Progressing   Problem: Coping: Goal: Ability to adjust to condition or change in health will improve Outcome: Progressing

## 2024-05-10 NOTE — Progress Notes (Signed)
 PHARMACY - ANTICOAGULATION CONSULT NOTE  Pharmacy Consult for heparin  and warfarin Indication: atrial fibrillation  Allergies  Allergen Reactions   Bee Venom Anaphylaxis    syncope    Patient Measurements: Height: 6' 1 (185.4 cm) Weight: 120.8 kg (266 lb 5.1 oz) IBW/kg (Calculated) : 79.9 HEPARIN  DW (KG): 106.2  Vital Signs: Temp: 98.4 F (36.9 C) (09/25 2314) Temp Source: Axillary (09/25 2314) BP: 94/58 (09/26 0000) Pulse Rate: 84 (09/26 0000)  Labs: Recent Labs    05/09/24 0455 05/09/24 0642 05/09/24 1758 05/10/24 0215  HGB 12.9*  --   --   --   HCT 39.8  --   --   --   PLT 176  --   --   --   LABPROT 20.1*  --   --   --   INR 1.6*  --   --   --   HEPARINUNFRC  --   --  0.15* 0.26*  CREATININE 1.11  --   --   --   TROPONINIHS 320* 452*  --   --     Estimated Creatinine Clearance: 84.3 mL/min (by C-G formula based on SCr of 1.11 mg/dL).   Assessment: 20 yoM with hx AF on warfarin PTA admitted with AF RVR and CP. Pt had AF DCCV 9/18, also has hx CVA. INR on admit low at 1.6, pharmacy to dose IV heparin  for now with ongoing CP. CBC ok.  Warfarin also restarted 9/25 Home dose is 5mg  MWF, 10mg  AODs.  New amio noted so may need reduced regimen longterm.  Heparin  level subtherapeutic, closer to goal (0.26) on infusion at 1850 units/hr. No issues with line or bleeding reported per RN.  Goal of Therapy:  Heparin  level 0.3-0.7 units/ml Monitor platelets by anticoagulation protocol: Yes   Plan:  Increase heparin  infusion to 2100 units/hr F/u 8 hr heparin  level  Lynwood Poplar, PharmD, BCPS Clinical Pharmacist 05/10/2024 2:47 AM

## 2024-05-10 NOTE — Interval H&P Note (Signed)
 History and Physical Interval Note:  05/10/2024 8:52 AM  Jose Joseph  has presented today for surgery, with the diagnosis of AFIB.  The various methods of treatment have been discussed with the patient and family. After consideration of risks, benefits and other options for treatment, the patient has consented to  Procedure(s): TRANSESOPHAGEAL ECHOCARDIOGRAM (N/A) CARDIOVERSION (N/A) as a surgical intervention.  The patient's history has been reviewed, patient examined, no change in status, stable for surgery.  I have reviewed the patient's chart and labs.  Questions were answered to the patient's satisfaction.     Ronson Hagins [employer]

## 2024-05-10 NOTE — Progress Notes (Signed)
 PHARMACY - ANTICOAGULATION CONSULT NOTE  Pharmacy Consult for heparin  drip and warfarin Indication: atrial fibrillation  Allergies  Allergen Reactions   Bee Venom Anaphylaxis    syncope    Patient Measurements: Height: 6' 1 (185.4 cm) Weight: 119.1 kg (262 lb 9.1 oz) IBW/kg (Calculated) : 79.9 HEPARIN  DW (KG): 106.2  Vital Signs: Temp: 97.9 F (36.6 C) (09/26 0749) Temp Source: Temporal (09/26 0749) BP: 102/64 (09/26 1200) Pulse Rate: 103 (09/26 1200)  Labs: Recent Labs    05/09/24 0455 05/09/24 0642 05/09/24 1758 05/10/24 0215 05/10/24 0429 05/10/24 1250  HGB 12.9*  --   --   --  12.0*  --   HCT 39.8  --   --   --  37.4*  --   PLT 176  --   --   --  176  --   LABPROT 20.1*  --   --   --   --  29.8*  INR 1.6*  --   --   --   --  2.7*  HEPARINUNFRC  --   --  0.15* 0.26*  --  0.34  CREATININE 1.11  --   --   --  1.07  --   TROPONINIHS 320* 452*  --   --   --   --     Estimated Creatinine Clearance: 86.9 mL/min (by C-G formula based on SCr of 1.07 mg/dL).   Medical History: Past Medical History:  Diagnosis Date   Arthritis    Asthma    Atrial fibrillation (HCC)    Cancer (HCC)    Complication of anesthesia    after his neck surgery pt. trying to get up and walk out   COPD (chronic obstructive pulmonary disease) (HCC)    Depression    History of kidney stones    2021   Hypertension    Metastatic lung carcinoma, right (HCC) 08/2023   Stroke [facility])      Assessment: 68 yoM with hx AF on warfarin PTA admitted with AF RVR and CP. Pt had AF DCCV 9/18, also has hx CVA. INR on admit low at 1.6, pharmacy to dose IV heparin  for now with ongoing CP. CBC ok.  Heparin  level 0.34 is therapeutic with heparin  running at 2100 units/hr. Hgb (12.0) and PLTs (176) are stable.  INR today is 2.7 up from 1.6 yesterday. Received warfarin 10 mg x1 yesterday. Patient started on amiodarone  this admission which may increase INR. Also patient was NPO overnight for TEE/DCCV this  morning which may have contributed to increase in INR. Will give a low dose of warfarin today to maintain therapeutic level and stop heparin  bridge, discussed with AHF MD. Per RN patient eating well today.  Goal of Therapy:  INR 2-3 Monitor platelets by anticoagulation protocol: Yes   Plan:  - Warfarin 1 mg x1 tonight - Stop heparin  infusion - Monitor INR, CBC, S/Sx bleeding daily   Thank you for allowing pharmacy to be a part of this patient's care.   Nidia Schaffer, PharmD PGY2 Cardiology Pharmacy Resident  Please check AMION for all [facility] phone numbers After 10:00 PM, call Main Pharmacy [phone] 05/10/2024

## 2024-05-10 NOTE — Anesthesia Preprocedure Evaluation (Signed)
 Anesthesia Evaluation  Patient identified by MRN, date of birth, ID band Patient awake    Reviewed: Allergy & Precautions, NPO status , Patient's Chart, lab work & pertinent test results  Airway Mallampati: III  TM Distance: >3 FB Neck ROM: Full    Dental  (+) Dental Advisory Given   Pulmonary shortness of breath, asthma , COPD, former smoker Hx of metastatic lung cancer   breath sounds clear to auscultation       Cardiovascular hypertension, Pt. on medications + dysrhythmias Atrial Fibrillation  Rhythm:Regular Rate:Normal  04/2024  IMPRESSIONS     1. Left ventricular ejection fraction, by estimation, is 55 to 60%. The  left ventricle has normal function. The left ventricle has no regional  wall motion abnormalities. Left ventricular diastolic parameters are  consistent with Grade I diastolic  dysfunction (impaired relaxation).   2. Right ventricular systolic function is normal. The right ventricular  size is normal.   3. The mitral valve is normal in structure. No evidence of mitral valve  regurgitation. No evidence of mitral stenosis.   4. The aortic valve is tricuspid. There is mild calcification of the  aortic valve. There is mild thickening of the aortic valve. Aortic valve  regurgitation is not visualized. Aortic valve sclerosis/calcification is  present, without any evidence of  aortic stenosis.     Neuro/Psych  PSYCHIATRIC DISORDERS  Depression    CVA    GI/Hepatic negative GI ROS, Neg liver ROS,,,  Endo/Other  negative endocrine ROS    Renal/GU negative Renal ROS     Musculoskeletal  (+) Arthritis ,    Abdominal  (+) + obese  Peds  Hematology negative hematology ROS (+)   Anesthesia Other Findings   Reproductive/Obstetrics                              Anesthesia Physical Anesthesia Plan  ASA: 3  Anesthesia Plan: MAC   Post-op Pain Management: Minimal or no pain  anticipated   Induction: Intravenous  PONV Risk Score and Plan: 1 and Treatment may vary due to age or medical condition and Propofol  infusion  Airway Management Planned: Nasal Cannula  Additional Equipment: None  Intra-op Plan:   Post-operative Plan:   Informed Consent: I have reviewed the patients History and Physical, chart, labs and discussed the procedure including the risks, benefits and alternatives for the proposed anesthesia with the patient or authorized representative who has indicated his/her understanding and acceptance.     Dental advisory given  Plan Discussed with: CRNA  Anesthesia Plan Comments:         Anesthesia Quick Evaluation

## 2024-05-10 NOTE — Assessment & Plan Note (Addendum)
 05/10/24 currently not exacerbated. Continue with inhalers  05/11/24 stable. On inhalers.  05/12/24 stable. Not exacerbated  05/13/24 stable. Not exacerbated  05/14/24 stable.

## 2024-05-10 NOTE — H&P (View-Only) (Signed)
 Advanced Heart Failure Rounding Note  Cardiologist: Vishnu SHAUNNA Maywood, MD  Chief Complaint:  Subjective:    On amio and heparin  gtt. Had brief conversion back to NSR yesterday and overnight but back in Afib w/ RVR this morning 120s-120s.   Felt SOB overnight. Placed on O2 via [place], currently 4L/min. O2 sats 94%.   CP resolved. Overall feels better this morning.    Objective:   Weight Range: 119.1 kg Body mass index is 34.64 kg/m.   Vital Signs:   Temp:  [97.8 F (36.6 C)-98.8 F (37.1 C)] 97.8 F (36.6 C) (09/26 0306) Pulse Rate:  [81-180] 98 (09/26 0500) Resp:  [11-38] 24 (09/26 0500) BP: (94-131)/(57-98) 114/64 (09/26 0400) SpO2:  [86 %-98 %] 98 % (09/26 0500) Weight:  [119.1 kg] 119.1 kg (09/26 0306) Last BM Date :  (PTA)  Weight change: Filed Weights   05/09/24 0433 05/10/24 0306  Weight: 120.8 kg 119.1 kg    Intake/Output:   Intake/Output Summary (Last 24 hours) at 05/10/2024 0716 Last data filed at 05/10/2024 0500 Gross per 24 hour  Intake 879.99 ml  Output 1050 ml  Net -170.01 ml      Physical Exam    Vitals:   05/10/24 0400 05/10/24 0500  BP: 114/64   Pulse: 97 98  Resp: (!) 25 (!) 24  Temp:    SpO2: 96% 98%   GENERAL: NAD Lungs- clear CARDIAC:  Irregularly irregular rate and rhythm, no MRG. No LEE  ABDOMEN: Soft, non-tender, non-distended.  EXTREMITIES: Warm and well perfused.  NEUROLOGIC: No obvious FND   Telemetry   Afib 120s-130s, personally reviewed   EKG    Afib 120s   Labs    CBC Recent Labs    05/09/24 0455 05/10/24 0429  WBC 16.2* 17.4*  HGB 12.9* 12.0*  HCT 39.8 37.4*  MCV 90.9 90.3  PLT 176 176   Basic Metabolic Panel Recent Labs    90/74/74 0455 05/10/24 0429  NA 137 135  K 3.8 3.8  CL 100 100  CO2 23 25  GLUCOSE 110* 113*  BUN 28* 25*  CREATININE 1.11 1.07  CALCIUM 9.3 9.1  MG  --  1.8   Liver Function Tests No results for input(s): AST, ALT, ALKPHOS, BILITOT, PROT, ALBUMIN  in  the last 72 hours. No results for input(s): LIPASE, AMYLASE in the last 72 hours. Cardiac Enzymes No results for input(s): CKTOTAL, CKMB, CKMBINDEX, TROPONINI in the last 72 hours.  BNP: BNP (last 3 results) Recent Labs    12/27/23 0408  BNP 71.4    ProBNP (last 3 results) No results for input(s): PROBNP in the last 8760 hours.   D-Dimer No results for input(s): DDIMER in the last 72 hours. Hemoglobin A1C Recent Labs    05/09/24 1135  HGBA1C 5.2   Fasting Lipid Panel No results for input(s): CHOL, HDL, LDLCALC, TRIG, CHOLHDL, LDLDIRECT in the last 72 hours. Thyroid Function Tests No results for input(s): TSH, T4TOTAL, T3FREE, THYROIDAB in the last 72 hours.  Invalid input(s): FREET3  Other results:   Imaging    No results found.   Medications:     Scheduled Medications:  binimetinib   45 mg Oral BID   Chlorhexidine  Gluconate Cloth  6 each Topical Daily   Chlorhexidine  Gluconate Cloth  6 each Topical Daily   citalopram   20 mg Oral q AM   encorafenib   450 mg Oral QHS   insulin  aspart  0-9 Units Subcutaneous TID WC   metoprolol  tartrate  25 mg Oral Q6H   potassium chloride   20 mEq Oral Once   sodium chloride  flush  10-40 mL Intracatheter Q12H   tamsulosin   0.4 mg Oral Daily   Warfarin - Pharmacist Dosing Inpatient   Does not apply q1600    Infusions:  amiodarone  30 mg/hr (05/10/24 0500)   heparin  2,100 Units/hr (05/10/24 0500)   magnesium  sulfate bolus IVPB      PRN Medications: mouth rinse, oxyCODONE , sodium chloride  flush     Assessment/Plan   1. Atrial Fibrillation w/ RVR - has been difficult to control. S/p multiple DCCVs w/ recurrence - now on amiodarone  gtt, continue load - INR subtherapeutic at 1.8 on admit. On heparin  gtt - plan TEE/DCCV today if no chemical conversion - supp K - suspect underlying OSA. Recommend outpatient sleep study  2. HFpEF - recent echo EF 55-60%, RV ok  - SOB overnight,  now on 4L [place]. Mild volume overload on exam - give 40 mg IV Lasix  x 1  3. Chest Pain w/ Elevated Hs Trop  - CP resolved, HS trop 320>>452. Trend c/w demand ischemia. Doubt ACS  4. Stage IV Lung Cancer - s/p radiation, on chemo - followed by oncology     Length of Stay: 1  Brittainy Simmons, PA-C  05/10/2024, 7:16 AM  Advanced Heart Failure Team Pager [phone] (M-F; 7a - 5p)  Please contact [facility] for night-coverage after hours (5p -7a ) and weekends on [email or web address]  Patient seen with PA, I formulated the plan and agree with the above note.    He converted transiently to NSR but is back in atrial fibrillation with rate 100s-110s, remains on amiodarone  gtt, heparin  gtt and warfarin. No INR this morning.  He reports increased dyspnea overnight.   General: NAD Neck: JVP 8-9, no thyromegaly or thyroid nodule.  Lungs: Clear to auscultation bilaterally with normal respiratory effort. CV: Nondisplaced PMI.  Heart mildly tachy, irregular S1/S2, no S3/S4, no murmur.  No peripheral edema.    Abdomen: Soft, nontender, no hepatosplenomegaly, no distention.  Skin: Intact without lesions or rashes.  Neurologic: Alert and oriented x 3.  Psych: Normal affect. Extremities: No clubbing or cyanosis.  HEENT: Normal.   We will increase amiodarone  gtt to 60 mg/hr.  If he remains in AF later this morning, planned for TEE-guided DCCV.  He is additionally on metoprolol  for rate control.   More short of breath with possible mild JVD, suspect HFpEF.  Will give Lasix  40 mg IV x 1 and follow.   [name] 05/10/2024 7:38 AM

## 2024-05-11 DIAGNOSIS — I2489 Other forms of acute ischemic heart disease: Secondary | ICD-10-CM | POA: Diagnosis not present

## 2024-05-11 DIAGNOSIS — I4891 Unspecified atrial fibrillation: Secondary | ICD-10-CM | POA: Diagnosis not present

## 2024-05-11 DIAGNOSIS — J449 Chronic obstructive pulmonary disease, unspecified: Secondary | ICD-10-CM | POA: Diagnosis not present

## 2024-05-11 DIAGNOSIS — Z7901 Long term (current) use of anticoagulants: Secondary | ICD-10-CM | POA: Diagnosis not present

## 2024-05-11 LAB — CBC
HCT: 36.2 % — ABNORMAL LOW (ref 39.0–52.0)
Hemoglobin: 11.6 g/dL — ABNORMAL LOW (ref 13.0–17.0)
MCH: 29.2 pg (ref 26.0–34.0)
MCHC: 32 g/dL (ref 30.0–36.0)
MCV: 91.2 fL (ref 80.0–100.0)
Platelets: 189 K/uL (ref 150–400)
RBC: 3.97 MIL/uL — ABNORMAL LOW (ref 4.22–5.81)
RDW: 12.9 % (ref 11.5–15.5)
WBC: 15.9 K/uL — ABNORMAL HIGH (ref 4.0–10.5)
nRBC: 0 % (ref 0.0–0.2)

## 2024-05-11 LAB — BASIC METABOLIC PANEL WITH GFR
Anion gap: 14 (ref 5–15)
BUN: 34 mg/dL — ABNORMAL HIGH (ref 8–23)
CO2: 24 mmol/L (ref 22–32)
Calcium: 9.1 mg/dL (ref 8.9–10.3)
Chloride: 98 mmol/L (ref 98–111)
Creatinine, Ser: 1.38 mg/dL — ABNORMAL HIGH (ref 0.61–1.24)
GFR, Estimated: 55 mL/min — ABNORMAL LOW (ref >60)
Glucose, Bld: 108 mg/dL — ABNORMAL HIGH (ref 70–99)
Potassium: 3.9 mmol/L (ref 3.5–5.1)
Sodium: 136 mmol/L (ref 135–145)

## 2024-05-11 LAB — GLUCOSE, CAPILLARY
Glucose-Capillary: 117 mg/dL — ABNORMAL HIGH (ref 70–99)
Glucose-Capillary: 121 mg/dL — ABNORMAL HIGH (ref 70–99)
Glucose-Capillary: 105 mg/dL — ABNORMAL HIGH (ref 70–99)
Glucose-Capillary: 134 mg/dL — ABNORMAL HIGH (ref 70–99)

## 2024-05-11 LAB — PROTIME-INR
INR: 2.4 — ABNORMAL HIGH (ref 0.8–1.2)
Prothrombin Time: 27.3 s — ABNORMAL HIGH (ref 11.4–15.2)

## 2024-05-11 LAB — MAGNESIUM: Magnesium: 1.9 mg/dL (ref 1.7–2.4)

## 2024-05-11 MED ORDER — ACETAMINOPHEN 500 MG PO TABS
1000.0000 mg | ORAL_TABLET | Freq: Four times a day (QID) | ORAL | Status: DC | PRN
  Administered 2024-05-11 – 2024-05-14 (×7): 1000 mg via ORAL
  Filled 2024-05-11 (×7): qty 2

## 2024-05-11 MED ORDER — MAGNESIUM SULFATE 2 GM/50ML IV SOLN
2.0000 g | Freq: Once | INTRAVENOUS | Status: AC
  Administered 2024-05-11: 2 g via INTRAVENOUS
  Filled 2024-05-11: qty 50

## 2024-05-11 MED ORDER — POTASSIUM CHLORIDE CRYS ER 20 MEQ PO TBCR
20.0000 meq | EXTENDED_RELEASE_TABLET | Freq: Once | ORAL | Status: AC
  Administered 2024-05-11: 20 meq via ORAL
  Filled 2024-05-11: qty 1

## 2024-05-11 MED ORDER — WARFARIN SODIUM 5 MG PO TABS
5.0000 mg | ORAL_TABLET | Freq: Once | ORAL | Status: AC
  Administered 2024-05-11: 5 mg via ORAL
  Filled 2024-05-11: qty 1

## 2024-05-11 MED ORDER — DAPAGLIFLOZIN PROPANEDIOL 10 MG PO TABS
10.0000 mg | ORAL_TABLET | Freq: Every day | ORAL | Status: DC
  Administered 2024-05-11 – 2024-05-14 (×4): 10 mg via ORAL
  Filled 2024-05-11 (×4): qty 1

## 2024-05-11 MED ORDER — METOPROLOL SUCCINATE ER 50 MG PO TB24
50.0000 mg | ORAL_TABLET | Freq: Every day | ORAL | Status: DC
  Administered 2024-05-12 – 2024-05-14 (×3): 50 mg via ORAL
  Filled 2024-05-11 (×4): qty 1

## 2024-05-11 NOTE — Progress Notes (Signed)
 PROGRESS NOTE    Jose Joseph  FMW:[id number] DOB: 05-22-1956 DOA: 05/09/2024 PCP: Sheryle Carwin, MD  Subjective: Pt seen and examined. Met with pt and his wife at bedside. Converted to NSR. Still on IV amio gtts. Discussed with cards. He can transfer to progressive unit on IV amio gtts. INR therapeutic. Off IV heparin . Only on coumadin .   Hospital Course: CC: chest pain HPI: 68 year old male with a history of persistent atrial fibrillation status post DCCV May 2025, metastatic lung adenocarcinoma, COPD, hypertension, stroke, depression, chronic HFpEF who presented to [facility] emergency department with complaint of ongoing chest pain.  Patient was noted to be in A-fib with RVR with heart rate in 140s, he was started on diltiazem  infusion, he continued to have chest pain, was started on nitroglycerin  infusion, cardiology evaluated the patient, decision was made to transfer patient to [facility], ICU.  Upon arrival patient noted to be in A-fib with RVR with HR in 140's, denies chest pain palpitation, shortness of breath, headache, fever, chills or other complaints  Stated he has been losing weight for last few months due to low appetite, lost 11 pounds in the last 2 months.  Also complained of increased urinary frequency, denies dysuria  Patient reported developing chest pain at 3:30 AM this morning while sitting in recliner, described as dull, 7/10, located midsternal, radiating to left arm.  Notes to be first episode of chest pain.  Associated with diaphoresis, lightheadedness, and dizziness.  CP improved at hospital once treated with morphine  and NTG.  Denied any SOB, orthopnea, palpitations, nausea/vomiting, edema, syncope.  Patient denies ever feeling atrial fibrillation and still does not.  Reports daily medication compliance.  Activity is limited due to back pain associated with sciatica nerve.   Presented to [facility] ED 05/09/2024 for chest pain. TN 320 > 452,  K3.8, CR 1.1, WBC 16.2, Hgb  12.9, INR 1.6, prothrombin 20 EKG: Afib w/ RVR, HR 123, PVC CXR with no acute findings; C/W atelectasis Treated with Cardizem  drip, Lopressor  IV 2.5 mg x1, ASA 324 mg, morphine  and NTG drip.  ED consulted with cardiology Dr. Shlomo who recommended transfer to [facility].  Significant Events: Admitted 05/09/2024 to CICU by PCCM due to rapid afib and demand cardiac ischemia 05-09-2024 placed on IV amiodaronge 05-10-2024 failed DCCV. Remains in rapid afib 05-08-2021 converted to NSR. Remains on IV amio. Cards gives approval to transfer to progressive care bed.  Admission Labs: WBC 16.2, HgB 12.9, plt 176 Na 137, K 3.8, CO2 of 23, BUN 28, Scr 1.11, glu 110 INR 1.6 A1c 5.2% UA small HgB, negative nitrite, negative LE, WBC 0-5, Rare bacteria Troponin I 320 >> 452  Admission Imaging Studies: CXR No acute findings. 2. Low lung volume with linear atelectasis/scarring in the right mid lung zone and left lung.  Significant Labs:   Significant Imaging Studies:   Antibiotic Therapy: Anti-infectives (From admission, onward)    None       Procedures:   Consultants: PCCM Cardiology EP/Cards    Assessment and Plan: * Demand ischemia (HCC) 05/10/24 cardiology has deemed pt's elevated troponin due to demand ischemia. NOT NSTEMI/ACS.   Atrial fibrillation with RVR (HCC) 05/10/24 Went for DCCV today. Unsuccessful. Pt still in rapid afib. Remains on IV heparin  and IV amiodarone  gtts. Cards plans on continue IV amiodarone  through the weekend.  05/11/24 converted to NSR. Continue with po coumadin .   Long term current use of anticoagulant 05/10/24 on coumadin  at home. Was sub-therapeutic on admission  with INR 1.6. on IV heparin  currently.  05/11/24 on po coumadin . INR 2.4. IV heparin  off since yesterday evening.    Malignant neoplasm of lung (HCC) 05/10/24 chronic. Followed by [facility].   Chronic obstructive pulmonary disease (HCC) 05/10/24 currently not exacerbated.  Continue with inhalers  05/11/24 stable. On inhalers.    Obesity, Class I, BMI 30-34.9 Body mass index is 34.64 kg/m.   Lung cancer metastatic to bone (HCC) 05/10/24 followed by oncology.  05/11/24 stable. F/u with oncology    DVT prophylaxis:  warfarin (COUMADIN ) tablet 5 mg     Code Status: Prior Family Communication: discussed with pt's wife Jose Joseph at bedside Disposition Plan: return home Reason for continuing need for hospitalization: remains on IV amiodarone .  Objective: Vitals:   05/11/24 0830 05/11/24 0900 05/11/24 0930 05/11/24 1000  BP: 98/67 108/85 116/77 107/82  Pulse: 78 81 82 83  Resp: (!) 22 (!) 28 20 (!) 33  Temp:      TempSrc:      SpO2: 96% 94% 95% 91%  Weight:      Height:        Intake/Output Summary (Last 24 hours) at 05/11/2024 1050 Last data filed at 05/11/2024 1000 Gross per 24 hour  Intake 1472.59 ml  Output 2250 ml  Net -777.41 ml   Filed Weights   05/09/24 0433 05/10/24 0306  Weight: 120.8 kg 119.1 kg    Examination:  Physical Exam Vitals and nursing note reviewed.  Constitutional:      General: He is not in acute distress.    Appearance: He is obese. He is not toxic-appearing.  HENT:     Head: Normocephalic and atraumatic.  Eyes:     General: No scleral icterus. Cardiovascular:     Rate and Rhythm: Normal rate. Rhythm irregular.  Pulmonary:     Effort: Pulmonary effort is normal. No respiratory distress.  Abdominal:     General: Bowel sounds are normal. There is no distension.     Palpations: Abdomen is soft.  Musculoskeletal:     Right lower leg: No edema.     Left lower leg: No edema.  Skin:    General: Skin is warm and dry.     Capillary Refill: Capillary refill takes less than 2 seconds.  Neurological:     General: No focal deficit present.     Mental Status: He is alert and oriented to person, place, and time.     Data Reviewed: I have personally reviewed following labs and imaging studies  CBC: Recent  Labs  Lab 05/09/24 0455 05/10/24 0429 05/11/24 0455  WBC 16.2* 17.4* 15.9*  HGB 12.9* 12.0* 11.6*  HCT 39.8 37.4* 36.2*  MCV 90.9 90.3 91.2  PLT 176 176 189   Basic Metabolic Panel: Recent Labs  Lab 05/09/24 0455 05/10/24 0429 05/11/24 0455  NA 137 135 136  K 3.8 3.8 3.9  CL 100 100 98  CO2 23 25 24   GLUCOSE 110* 113* 108*  BUN 28* 25* 34*  CREATININE 1.11 1.07 1.38*  CALCIUM 9.3 9.1 9.1  MG  --  1.8 1.9   GFR: Estimated Creatinine Clearance: 67.4 mL/min (A) (by C-G formula based on SCr of 1.38 mg/dL (H)). Coagulation Profile: Recent Labs  Lab 05/09/24 0455 05/10/24 1250 05/11/24 0455  INR 1.6* 2.7* 2.4*   BNP (last 3 results) Recent Labs    12/27/23 0408  BNP 71.4   HbA1C: Recent Labs    05/09/24 1135  HGBA1C 5.2   CBG:  Recent Labs  Lab 05/10/24 0618 05/10/24 1226 05/10/24 1513 05/10/24 2138 05/11/24 0634  GLUCAP 107* 124* 123* 140* 117*    Recent Results (from the past 240 hours)  MRSA Next Gen by PCR, Nasal     Status: None   Collection Time: 05/01/24  2:29 PM   Specimen: Nasal Mucosa; Nasal Swab  Result Value Ref Range Status   MRSA by PCR Next Gen NOT DETECTED NOT DETECTED Final    Comment: (NOTE) The GeneXpert MRSA Assay (FDA approved for NASAL specimens only), is one component of a comprehensive MRSA colonization surveillance program. It is not intended to diagnose MRSA infection nor to guide or monitor treatment for MRSA infections. Test performance is not FDA approved in patients less than 74 years old. Performed at [facility], [address], [place], [place] [address]   MRSA Next Gen by PCR, Nasal     Status: None   Collection Time: 05/09/24  9:35 AM   Specimen: Nasal Mucosa; Nasal Swab  Result Value Ref Range Status   MRSA by PCR Next Gen NOT DETECTED NOT DETECTED Final    Comment: (NOTE) The GeneXpert MRSA Assay (FDA approved for NASAL specimens only), is one component of a comprehensive MRSA colonization  surveillance program. It is not intended to diagnose MRSA infection nor to guide or monitor treatment for MRSA infections. Test performance is not FDA approved in patients less than 78 years old. Performed at [facility], [address], [place], [place] [address]      Radiology Studies: ECHO TEE Result Date: 05/10/2024    TRANSESOPHOGEAL ECHO REPORT   Patient Name:   Jose Joseph Date of Exam: 05/10/2024 Medical Rec #:  [id number]     Height:       73.0 in Accession #:    [id number]    Weight:       262.6 lb Date of Birth:  02-14-1956     BSA:          2.416 m Patient Age:    68 years      BP:           85/52 mmHg Patient Gender: M             HR:           139 bpm. Exam Location:  Inpatient Procedure: Transesophageal Echo, 3D Echo, Color Doppler and Cardiac Doppler            (Both Spectral and Color Flow Doppler were utilized during            procedure). Indications:     I48.91* Unspecified atrial fibrillation  History:         Patient has prior history of Echocardiogram examinations, most                  recent 05/01/2024. COPD, Arrythmias:Atrial Fibrillation; Risk                  Factors:Hypertension and Lung Cancer.  Sonographer:     Damien Senior RDCS Referring Phys:  [id number] LONNI LITTIE NANAS Diagnosing Phys: Ezra Kanner PROCEDURE: After discussion of the risks and benefits of a TEE, an informed consent was obtained from the patient. The transesophogeal probe was passed without difficulty through the esophogus of the patient. Imaged were obtained with the patient in a left lateral decubitus position. Sedation performed by different physician. The patient was monitored while under deep sedation. Anesthestetic sedation was provided intravenously by  Anesthesiology: 203mg  of Propofol , 50mg  of Lidocaine . The patient developed no complications during the procedure. An unsuccessful direct current cardioversion was performed at 360 joules with 3 attempts.  IMPRESSIONS  1. Left ventricular  ejection fraction, by estimation, is 55 to 60%. The left ventricle has normal function. The left ventricle has no regional wall motion abnormalities.  2. Right ventricular systolic function is normal. The right ventricular size is normal. Tricuspid regurgitation signal is inadequate for assessing PA pressure.  3. Left atrial size was mildly dilated. No left atrial/left atrial appendage thrombus was detected.  4. The mitral valve is normal in structure. Trivial mitral valve regurgitation. No evidence of mitral stenosis.  5. The aortic valve is tricuspid. Aortic valve regurgitation is trivial. No aortic stenosis is present.  6. No PFO or ASD by color doppler. FINDINGS  Left Ventricle: Left ventricular ejection fraction, by estimation, is 55 to 60%. The left ventricle has normal function. The left ventricle has no regional wall motion abnormalities. The left ventricular internal cavity size was normal in size. There is  no left ventricular hypertrophy. Right Ventricle: The right ventricular size is normal. No increase in right ventricular wall thickness. Right ventricular systolic function is normal. Tricuspid regurgitation signal is inadequate for assessing PA pressure. Left Atrium: Left atrial size was mildly dilated. No left atrial/left atrial appendage thrombus was detected. Right Atrium: Right atrial size was normal in size. Pericardium: There is no evidence of pericardial effusion. Mitral Valve: The mitral valve is normal in structure. Trivial mitral valve regurgitation. No evidence of mitral valve stenosis. Tricuspid Valve: The tricuspid valve is normal in structure. Tricuspid valve regurgitation is trivial. Aortic Valve: The aortic valve is tricuspid. Aortic valve regurgitation is trivial. No aortic stenosis is present. Pulmonic Valve: The pulmonic valve was grossly normal. Pulmonic valve regurgitation is not visualized. Aorta: The aortic root and ascending aorta are structurally normal, with no evidence of  dilitation. IAS/Shunts: No PFO or ASD by color doppler. Additional Comments: Spectral Doppler performed. [employer] Electronically signed by Ezra Kanner Signature Date/Time: 05/10/2024/6:13:20 PM    Final    EP STUDY Result Date: 05/10/2024 See surgical note for result.   Scheduled Meds:  binimetinib   45 mg Oral BID   budesonide -glycopyrrolate -formoterol   2 puff Inhalation BID   Chlorhexidine  Gluconate Cloth  6 each Topical Daily   Chlorhexidine  Gluconate Cloth  6 each Topical Daily   citalopram   20 mg Oral q AM   dapagliflozin propanediol  10 mg Oral Daily   encorafenib   450 mg Oral QHS   feeding supplement  237 mL Oral BID BM   insulin  aspart  0-9 Units Subcutaneous TID WC   metoprolol  succinate  50 mg Oral Daily   sodium chloride  flush  10-40 mL Intracatheter Q12H   tamsulosin   0.4 mg Oral Daily   warfarin  5 mg Oral ONCE-1600   Warfarin - Pharmacist Dosing Inpatient   Does not apply q1600   Continuous Infusions:  amiodarone  30 mg/hr (05/11/24 1000)   magnesium  sulfate bolus IVPB       LOS: 2 days   Time spent: 55 minutes  Camellia Door, DO  [facility]  05/11/2024, 10:50 AM

## 2024-05-11 NOTE — Progress Notes (Signed)
 PHARMACY - ANTICOAGULATION CONSULT NOTE  Pharmacy Consult for heparin  drip and warfarin Indication: atrial fibrillation  Allergies  Allergen Reactions   Bee Venom Anaphylaxis    syncope    Patient Measurements: Height: 6' 1 (185.4 cm) Weight: 119.1 kg (262 lb 9.1 oz) IBW/kg (Calculated) : 79.9 HEPARIN  DW (KG): 106.2  Vital Signs: Temp: 98.8 F (37.1 C) (09/27 0812) Temp Source: Oral (09/27 0812) BP: 103/70 (09/27 0800) Pulse Rate: 79 (09/27 0815)  Labs: Recent Labs    05/09/24 0455 05/09/24 9357 05/09/24 1758 05/10/24 0215 05/10/24 0429 05/10/24 1250 05/11/24 0455  HGB 12.9*  --   --   --  12.0*  --  11.6*  HCT 39.8  --   --   --  37.4*  --  36.2*  PLT 176  --   --   --  176  --  189  LABPROT 20.1*  --   --   --   --  29.8* 27.3*  INR 1.6*  --   --   --   --  2.7* 2.4*  HEPARINUNFRC  --   --  0.15* 0.26*  --  0.34  --   CREATININE 1.11  --   --   --  1.07  --  1.38*  TROPONINIHS 320* 452*  --   --   --   --   --     Estimated Creatinine Clearance: 67.4 mL/min (A) (by C-G formula based on SCr of 1.38 mg/dL (H)).   Medical History: Past Medical History:  Diagnosis Date   Arthritis    Asthma    Atrial fibrillation (HCC)    Cancer (HCC)    Complication of anesthesia    after his neck surgery pt. trying to get up and walk out   COPD (chronic obstructive pulmonary disease) (HCC)    COPD with acute exacerbation (HCC) 12/15/2023   Depression    History of kidney stones    2021   History of stroke 12/26/2023   Hypertension    Metastatic lung carcinoma, right (HCC) 08/2023   Pericardial effusion 12/26/2023   Status post total replacement of left hip 01/15/2021   Stroke [facility])      Assessment: 68 yoM with hx AF on warfarin PTA admitted with AF RVR and CP. Pt had AF DCCV 9/18, also has hx CVA. INR on admit low at 1.6, to heparin  bridge started now off, warfarin resumed.  INR therapeutic at 2.4. Home dose is 10mg  daily except 5mg  Mon.   Goal of Therapy:   INR 2-3 Monitor platelets by anticoagulation protocol: Yes   Plan:  -Warfarin 5mg  PO x1 -Daily INR  Ozell Jamaica, PharmD, BCPS, [facility] Clinical Pharmacist [phone] Please check AMION for all [facility] Pharmacy numbers 05/11/2024

## 2024-05-11 NOTE — Progress Notes (Signed)
 Patient ID: Jose Joseph, male   DOB: [birth year], 68 y.o.   MRN: [id number]     Advanced Heart Failure Rounding Note  Cardiologist: Diannah SHAUNNA Maywood, MD  Chief Complaint:  Subjective:    On amiodarone  gtt 60 mg/hr and heparin  gtt.  He failed TEE-guided DCCV yesterday but converted to NSR overnight on amiodarone .   Breathing much better this morning after IV Lasix  yesterday.  Creatinine up to 1.38 but I was able to take him off oxygen .   TEE: EF 55-60%, RV normal.    Objective:   Weight Range: 119.1 kg Body mass index is 34.64 kg/m.   Vital Signs:   Temp:  [97.6 F (36.4 C)-98.8 F (37.1 C)] 98.8 F (37.1 C) (09/27 0812) Pulse Rate:  [75-141] 79 (09/27 0815) Resp:  [16-43] 19 (09/27 0815) BP: (83-152)/(58-81) 103/70 (09/27 0800) SpO2:  [88 %-96 %] 95 % (09/27 0815) Last BM Date :  (PTA)  Weight change: Filed Weights   05/09/24 0433 05/10/24 0306  Weight: 120.8 kg 119.1 kg    Intake/Output:   Intake/Output Summary (Last 24 hours) at 05/11/2024 0936 Last data filed at 05/11/2024 0800 Gross per 24 hour  Intake 1470.92 ml  Output 2250 ml  Net -779.08 ml      Physical Exam    Vitals:   05/11/24 0812 05/11/24 0815  BP:    Pulse:  79  Resp:  19  Temp: 98.8 F (37.1 C)   SpO2:  95%   General: NAD Neck: No JVD, no thyromegaly or thyroid nodule.  Lungs: Clear to auscultation bilaterally with normal respiratory effort. CV: Nondisplaced PMI.  Heart regular S1/S2, no S3/S4, no murmur.  No peripheral edema.  Abdomen: Soft, nontender, no hepatosplenomegaly, no distention.  Skin: Intact without lesions or rashes.  Neurologic: Alert and oriented x 3.  Psych: Normal affect. Extremities: No clubbing or cyanosis.  HEENT: Normal.   Telemetry   NSR 80s, personally reviewed    Labs    CBC Recent Labs    05/10/24 0429 05/11/24 0455  WBC 17.4* 15.9*  HGB 12.0* 11.6*  HCT 37.4* 36.2*  MCV 90.3 91.2  PLT 176 189   Basic Metabolic Panel Recent Labs     05/10/24 0429 05/11/24 0455  NA 135 136  K 3.8 3.9  CL 100 98  CO2 25 24  GLUCOSE 113* 108*  BUN 25* 34*  CREATININE 1.07 1.38*  CALCIUM 9.1 9.1  MG 1.8 1.9   Liver Function Tests No results for input(s): AST, ALT, ALKPHOS, BILITOT, PROT, ALBUMIN  in the last 72 hours. No results for input(s): LIPASE, AMYLASE in the last 72 hours. Cardiac Enzymes No results for input(s): CKTOTAL, CKMB, CKMBINDEX, TROPONINI in the last 72 hours.  BNP: BNP (last 3 results) Recent Labs    12/27/23 0408  BNP 71.4    ProBNP (last 3 results) No results for input(s): PROBNP in the last 8760 hours.   D-Dimer No results for input(s): DDIMER in the last 72 hours. Hemoglobin A1C Recent Labs    05/09/24 1135  HGBA1C 5.2   Fasting Lipid Panel No results for input(s): CHOL, HDL, LDLCALC, TRIG, CHOLHDL, LDLDIRECT in the last 72 hours. Thyroid Function Tests No results for input(s): TSH, T4TOTAL, T3FREE, THYROIDAB in the last 72 hours.  Invalid input(s): FREET3  Other results:   Imaging    No results found.   Medications:     Scheduled Medications:  binimetinib   45 mg Oral BID   budesonide -glycopyrrolate -formoterol   2  puff Inhalation BID   Chlorhexidine  Gluconate Cloth  6 each Topical Daily   Chlorhexidine  Gluconate Cloth  6 each Topical Daily   citalopram   20 mg Oral q AM   dapagliflozin propanediol  10 mg Oral Daily   encorafenib   450 mg Oral QHS   feeding supplement  237 mL Oral BID BM   insulin  aspart  0-9 Units Subcutaneous TID WC   metoprolol  tartrate  50 mg Oral Q6H   potassium chloride   20 mEq Oral Once   sodium chloride  flush  10-40 mL Intracatheter Q12H   tamsulosin   0.4 mg Oral Daily   warfarin  5 mg Oral ONCE-1600   Warfarin - Pharmacist Dosing Inpatient   Does not apply q1600    Infusions:  amiodarone  60 mg/hr (05/11/24 0836)   magnesium  sulfate bolus IVPB      PRN Medications: acetaminophen , mouth rinse,  oxyCODONE , sodium chloride  flush     Assessment/Plan   1. Atrial Fibrillation w/ RVR - Cannot get diltiazem  due to interaction with his lung cancer medication.  - has been difficult to control. S/p multiple DCCVs w/ recurrence - Failed TEE-DCCV on 9/26 but converted to NSR overnight.  - Decrease amiodarone  gtt to 30 mg/hr today, to po tomorrow if stays in NSR.  - INR 2.1 on warfarin, stopped heparin .  - suspect underlying OSA. Recommend outpatient sleep study - Transition metoprolol  to Toprol  XL.  - He should be seen by EP as outpatient to consider AF ablation.  - ECG to check QTc this morning with amiodarone  + cancer meds.   2. HFpEF - recent echo EF 55-60%, RV ok  - Volume overloaded with AF/RVR, got IV Lasix  yesterday and looks euvolemic today.  Oxygen  stopped.  - Start Farxiga 10 mg daily.   3. Chest Pain w/ Elevated Hs Trop  - CP resolved, HS trop 320>>452. Trend c/w demand ischemia. Doubt ACS  4. Stage IV Lung Cancer - s/p radiation, now on Braftovi  and Mektovi  which interact with multiple meds.  - followed by oncology   Length of Stay: 2  [name], MD  05/11/2024, 9:36 AM  Advanced Heart Failure Team Pager [phone] (M-F; 7a - 5p)  Please contact [facility] for night-coverage after hours (5p -7a ) and weekends on [email or web address]

## 2024-05-11 NOTE — Evaluation (Signed)
 Physical Therapy Evaluation Patient Details Name: Jose Joseph MRN: [id number] DOB: 1956/02/17 Today's Date: 05/11/2024  History of Present Illness  68 year old male who presented 9/25 to [facility] emergency department with complaint of ongoing chest pain. Patient was noted to be in A-fib with RVR. Transferred to [facility]. 09/26 failed DCCV. 9/24 converted to NSR. PMH: atrial fibrillation status post DCCV May 2025, metastatic lung adenocarcinoma, COPD, hypertension, stroke, depression, chronic HFpEF.  Clinical Impression  Pt admitted with above diagnosis. Previously independent but had sudden onset of back pain about 3 weeks ago and now using a SPC to ambulate at home. Denies falls but notices Rt knee buckles at times and has bil LE numbness (denies saddle paresthesia or bowel/bladder changes.) States his PCP aware and is awaiting evaluation with ortho at outpatient clinic. Pt required CGA for transfer and short distance gait today limited by his back pain. Rollator for support. HR in 80s, SpO2 93% on RA during session. May benefit from HHPT follow-up due to difficulty with mobility due to back pain. Pt currently with functional limitations due to the deficits listed below (see PT Problem List). Pt will benefit from acute skilled PT to increase their independence and safety with mobility to allow discharge.           If plan is discharge home, recommend the following: A little help with bathing/dressing/bathroom;Assistance with cooking/housework;Assist for transportation;Help with stairs or ramp for entrance   Can travel by private vehicle        Equipment Recommendations Rollator (4 wheels)  Recommendations for Other Services       Functional Status Assessment Patient has had a recent decline in their functional status and demonstrates the ability to make significant improvements in function in a reasonable and predictable amount of time.     Precautions / Restrictions  Precautions Precautions: Fall Recall of Precautions/Restrictions: Intact Precaution/Restrictions Comments: Monitor HR and BP Restrictions Weight Bearing Restrictions Per Provider Order: No      Mobility  Bed Mobility               General bed mobility comments: in recliner    Transfers Overall transfer level: Needs assistance Equipment used: Rollator (4 wheels) Transfers: Sit to/from Stand Sit to Stand: Contact guard assist           General transfer comment: CGA for safety, performed from recliner and seat on 4WW. Cues for set-up, brake check and engagement prior to rising. Slow to rise but steady once upright with hands on rollator.    Ambulation/Gait Ambulation/Gait assistance: Contact guard assist Gait Distance (Feet): 15 Feet (x2) Assistive device: Rolling walker (2 wheels) Gait Pattern/deviations: Step-through pattern, Decreased stride length, Antalgic, Trunk flexed Gait velocity: dec Gait velocity interpretation: <1.31 ft/sec, indicative of household ambulator   General Gait Details: Cues for safe AD use with rollator. No overt buckling noted. Pt distance limited by pain. Trunk flexed for comfort, worse with upright stance. Seated rest break after approx 15 feet. VSS with short distance completed.  Stairs            Wheelchair Mobility     Tilt Bed    Modified Rankin (Stroke Patients Only)       Balance Overall balance assessment: Needs assistance Sitting-balance support: Feet supported, No upper extremity supported Sitting balance-Leahy Scale: Good     Standing balance support: No upper extremity supported, During functional activity Standing balance-Leahy Scale: Fair Standing balance comment: Prefers UE support  Pertinent Vitals/Pain Pain Assessment Pain Assessment: Faces Faces Pain Scale: Hurts even more Pain Location: back Pain Descriptors / Indicators: Aching Pain Intervention(s): Limited  activity within patient's tolerance, Monitored during session, Repositioned    Home Living Family/patient expects to be discharged to:: Private residence Living Arrangements: Spouse/significant other Available Help at Discharge: Family;Available 24 hours/day Type of Home: House Home Access: Stairs to enter Entrance Stairs-Rails: Doctor, general practice of Steps: 3   Home Layout: One level Home Equipment: Rollator (4 wheels);Cane - single point;Shower seat      Prior Function Prior Level of Function : Independent/Modified Independent;Driving             Mobility Comments: Independent, back pain 3 weeks ago, ongoing, now using [facility] for this reason. ADLs Comments: ind.     Extremity/Trunk Assessment   Upper Extremity Assessment Upper Extremity Assessment: Defer to OT evaluation    Lower Extremity Assessment Lower Extremity Assessment: Generalized weakness (Pain and numbness that he reports radiates down legs all the way to his toes. Ongoing for about 3 weeks (waiting to see ortho.) Denies saddle paresthesia or changes to bowel/bladder habits. Complains Rt knee buckles but has not fallen.)       Communication   Communication Communication: No apparent difficulties    Cognition Arousal: Alert Behavior During Therapy: WFL for tasks assessed/performed   PT - Cognitive impairments: No apparent impairments                         Following commands: Intact       Cueing Cueing Techniques: Verbal cues     General Comments General comments (skin integrity, edema, etc.): HR 80s, SpO2 93% on RA.    Exercises General Exercises - Lower Extremity Ankle Circles/Pumps: AROM, Both, 10 reps, Seated Quad Sets: Strengthening, Both, 10 reps, Seated Gluteal Sets: Strengthening, Both, 10 reps, Seated   Assessment/Plan    PT Assessment Patient needs continued PT services  PT Problem List Decreased strength;Decreased activity tolerance;Decreased  balance;Decreased mobility;Decreased knowledge of use of DME;Cardiopulmonary status limiting activity;Pain       PT Treatment Interventions DME instruction;Gait training;Functional mobility training;Therapeutic activities;Therapeutic exercise;Balance training;Neuromuscular re-education;Patient/family education;Modalities    PT Goals (Current goals can be found in the Care Plan section)  Acute Rehab PT Goals Patient Stated Goal: Reduce pain PT Goal Formulation: With patient Time For Goal Achievement: 05/24/24 Potential to Achieve Goals: Good    Frequency Min 2X/week     Co-evaluation               AM-PAC PT 6 Clicks Mobility  Outcome Measure Help needed turning from your back to your side while in a flat bed without using bedrails?: None Help needed moving from lying on your back to sitting on the side of a flat bed without using bedrails?: A Little Help needed moving to and from a bed to a chair (including a wheelchair)?: A Little Help needed standing up from a chair using your arms (e.g., wheelchair or bedside chair)?: A Little Help needed to walk in hospital room?: A Little Help needed climbing 3-5 steps with a railing? : A Little 6 Click Score: 19    End of Session Equipment Utilized During Treatment: Gait belt Activity Tolerance: Patient limited by pain Patient left: in chair;with call bell/phone within reach;with family/visitor present Nurse Communication: Mobility status PT Visit Diagnosis: Unsteadiness on feet (R26.81);Other abnormalities of gait and mobility (R26.89);Muscle weakness (generalized) (M62.81);Difficulty in walking, not elsewhere classified (R26.2);Pain Pain -  part of body:  (back radiating into bil LEs)    Time: 1137-1208 PT Time Calculation (min) (ACUTE ONLY): 31 min   Charges:   PT Evaluation $PT Eval Low Complexity: 1 Low PT Treatments $Therapeutic Activity: 8-22 mins PT General Charges $$ ACUTE PT VISIT: 1 Visit         Leontine Roads, PT, DPT [facility]  Rehabilitation Services Physical Therapist Office: [phone] Website: [email or web address]   Leontine GORMAN Roads 05/11/2024, 1:59 PM

## 2024-05-12 ENCOUNTER — Encounter (HOSPITAL_COMMUNITY): Payer: Self-pay | Admitting: Cardiology

## 2024-05-12 DIAGNOSIS — I4891 Unspecified atrial fibrillation: Secondary | ICD-10-CM | POA: Diagnosis not present

## 2024-05-12 DIAGNOSIS — J449 Chronic obstructive pulmonary disease, unspecified: Secondary | ICD-10-CM | POA: Diagnosis not present

## 2024-05-12 DIAGNOSIS — I2489 Other forms of acute ischemic heart disease: Secondary | ICD-10-CM | POA: Diagnosis not present

## 2024-05-12 DIAGNOSIS — C3490 Malignant neoplasm of unspecified part of unspecified bronchus or lung: Secondary | ICD-10-CM | POA: Diagnosis not present

## 2024-05-12 LAB — BASIC METABOLIC PANEL WITH GFR
Anion gap: 13 (ref 5–15)
BUN: 30 mg/dL — ABNORMAL HIGH (ref 8–23)
CO2: 26 mmol/L (ref 22–32)
Calcium: 9 mg/dL (ref 8.9–10.3)
Chloride: 97 mmol/L — ABNORMAL LOW (ref 98–111)
Creatinine, Ser: 1.22 mg/dL (ref 0.61–1.24)
GFR, Estimated: >60 mL/min (ref >60)
Glucose, Bld: 108 mg/dL — ABNORMAL HIGH (ref 70–99)
Potassium: 3.8 mmol/L (ref 3.5–5.1)
Sodium: 136 mmol/L (ref 135–145)

## 2024-05-12 LAB — CBC
HCT: 36.8 % — ABNORMAL LOW (ref 39.0–52.0)
Hemoglobin: 11.9 g/dL — ABNORMAL LOW (ref 13.0–17.0)
MCH: 29 pg (ref 26.0–34.0)
MCHC: 32.3 g/dL (ref 30.0–36.0)
MCV: 89.5 fL (ref 80.0–100.0)
Platelets: 190 K/uL (ref 150–400)
RBC: 4.11 MIL/uL — ABNORMAL LOW (ref 4.22–5.81)
RDW: 13.1 % (ref 11.5–15.5)
WBC: 13.7 K/uL — ABNORMAL HIGH (ref 4.0–10.5)
nRBC: 0 % (ref 0.0–0.2)

## 2024-05-12 LAB — GLUCOSE, CAPILLARY
Glucose-Capillary: 112 mg/dL — ABNORMAL HIGH (ref 70–99)
Glucose-Capillary: 129 mg/dL — ABNORMAL HIGH (ref 70–99)
Glucose-Capillary: 119 mg/dL — ABNORMAL HIGH (ref 70–99)
Glucose-Capillary: 118 mg/dL — ABNORMAL HIGH (ref 70–99)

## 2024-05-12 LAB — MAGNESIUM: Magnesium: 2.1 mg/dL (ref 1.7–2.4)

## 2024-05-12 LAB — PROTIME-INR
INR: 2.4 — ABNORMAL HIGH (ref 0.8–1.2)
Prothrombin Time: 27.5 s — ABNORMAL HIGH (ref 11.4–15.2)

## 2024-05-12 MED ORDER — POTASSIUM CHLORIDE 10 MEQ/100ML IV SOLN
10.0000 meq | INTRAVENOUS | Status: AC
  Administered 2024-05-12 (×2): 10 meq via INTRAVENOUS
  Filled 2024-05-12 (×2): qty 100

## 2024-05-12 MED ORDER — POTASSIUM CHLORIDE CRYS ER 20 MEQ PO TBCR
40.0000 meq | EXTENDED_RELEASE_TABLET | ORAL | Status: AC
  Administered 2024-05-12 (×2): 40 meq via ORAL
  Filled 2024-05-12 (×2): qty 2

## 2024-05-12 MED ORDER — WARFARIN SODIUM 5 MG PO TABS
5.0000 mg | ORAL_TABLET | Freq: Once | ORAL | Status: AC
  Administered 2024-05-12: 5 mg via ORAL
  Filled 2024-05-12 (×2): qty 1

## 2024-05-12 NOTE — Anesthesia Preprocedure Evaluation (Signed)
 Anesthesia Evaluation    Reviewed: Allergy & Precautions, Patient's Chart, lab work & pertinent test results, reviewed documented beta blocker date and time   History of Anesthesia Complications Negative for: history of anesthetic complications  Airway        Dental   Pulmonary COPD, former smoker          Cardiovascular hypertension, Pt. on medications and Pt. on home beta blockers + dysrhythmias (on Coumadin ) Atrial Fibrillation   EF 55-60%, valves ok   Neuro/Psych    Depression    CVA    GI/Hepatic negative GI ROS, Neg liver ROS,,,  Endo/Other  negative endocrine ROS    Renal/GU negative Renal ROS     Musculoskeletal  (+) Arthritis ,    Abdominal   Peds  Hematology negative hematology ROS (+)   Anesthesia Other Findings   Reproductive/Obstetrics                              Anesthesia Physical Anesthesia Plan  ASA: 3  Anesthesia Plan: General   Post-op Pain Management: Minimal or no pain anticipated   Induction: Intravenous  PONV Risk Score and Plan: Treatment may vary due to age or medical condition and Propofol  infusion  Airway Management Planned: Mask  Additional Equipment: None  Intra-op Plan:   Post-operative Plan:   Informed Consent:   Plan Discussed with:   Anesthesia Plan Comments:          Anesthesia Quick Evaluation

## 2024-05-12 NOTE — Plan of Care (Signed)
  Problem: Education: Goal: Knowledge of General Education information will improve Description: Including pain rating scale, medication(s)/side effects and non-pharmacologic comfort measures Outcome: Progressing   Problem: Health Behavior/Discharge Planning: Goal: Ability to manage health-related needs will improve Outcome: Progressing   Problem: Clinical Measurements: Goal: Will remain free from infection Outcome: Progressing Goal: Diagnostic test results will improve Outcome: Progressing   Problem: Activity: Goal: Risk for activity intolerance will decrease Outcome: Progressing   Problem: Nutrition: Goal: Adequate nutrition will be maintained Outcome: Progressing   Problem: Elimination: Goal: Will not experience complications related to bowel motility Outcome: Progressing

## 2024-05-12 NOTE — Progress Notes (Signed)
 OT Cancellation Note  Patient Details Name: MANG HAZELRIGG MRN: [id number] DOB: 1956/02/22   Cancelled Treatment:    Reason Eval/Treat Not Completed: Pain limiting ability to participate (pt reports feeling lousy today and significant abd pain. asking OT to return tomorrow.)  Emberlin Verner D Walton, OTD, OTR/L [facility] Acute Rehabilitation Office: [phone]   Elma JONETTA Penner 05/12/2024, 3:14 PM

## 2024-05-12 NOTE — Progress Notes (Signed)
 PHARMACY - ANTICOAGULATION CONSULT NOTE  Pharmacy Consult for heparin  drip and warfarin Indication: atrial fibrillation  Allergies  Allergen Reactions   Bee Venom Anaphylaxis    syncope    Patient Measurements: Height: 6' 1 (185.4 cm) Weight: 119.2 kg (262 lb 12.6 oz) IBW/kg (Calculated) : 79.9 HEPARIN  DW (KG): 105.7  Vital Signs: Temp: 99.3 F (37.4 C) (09/28 0734) Temp Source: Oral (09/28 0734) BP: 120/86 (09/28 0734) Pulse Rate: 128 (09/28 0938)  Labs: Recent Labs    05/09/24 1758 05/10/24 0215 05/10/24 0429 05/10/24 0429 05/10/24 1250 05/11/24 0455 05/12/24 0704  HGB  --   --  12.0*   < >  --  11.6* 11.9*  HCT  --   --  37.4*  --   --  36.2* 36.8*  PLT  --   --  176  --   --  189 190  LABPROT  --   --   --   --  29.8* 27.3* 27.5*  INR  --   --   --   --  2.7* 2.4* 2.4*  HEPARINUNFRC 0.15* 0.26*  --   --  0.34  --   --   CREATININE  --   --  1.07  --   --  1.38* 1.22   < > = values in this interval not displayed.    Estimated Creatinine Clearance: 76.2 mL/min (by C-G formula based on SCr of 1.22 mg/dL).   Medical History: Past Medical History:  Diagnosis Date   Arthritis    Asthma    Atrial fibrillation (HCC)    Cancer (HCC)    Complication of anesthesia    after his neck surgery pt. trying to get up and walk out   COPD (chronic obstructive pulmonary disease) (HCC)    COPD with acute exacerbation (HCC) 12/15/2023   Depression    History of kidney stones    2021   History of stroke 12/26/2023   Hypertension    Metastatic lung carcinoma, right (HCC) 08/2023   Pericardial effusion 12/26/2023   Status post total replacement of left hip 01/15/2021   Stroke [facility])      Assessment: 68 yoM with hx AF on warfarin PTA admitted with AF RVR and CP. Pt had AF DCCV 9/18, also has hx CVA. INR on admit low at 1.6, to heparin  bridge started now off, warfarin resumed.  INR therapeutic at 2.4. Home dose is 10mg  daily except 5mg  Mon/Wed/Fri. New amiodarone  will  reduce warfarin requirements.  Goal of Therapy:  INR 2-3 Monitor platelets by anticoagulation protocol: Yes   Plan:  -Warfarin 5mg  PO x1 -Daily INR  Ozell Jamaica, PharmD, BCPS, [facility] Clinical Pharmacist [phone] Please check AMION for all [facility] Pharmacy numbers 05/12/2024

## 2024-05-12 NOTE — Plan of Care (Signed)
  Problem: Education: Goal: Knowledge of General Education information will improve Description: Including pain rating scale, medication(s)/side effects and non-pharmacologic comfort measures Outcome: Progressing   Problem: Clinical Measurements: Goal: Diagnostic test results will improve Outcome: Progressing Goal: Cardiovascular complication will be avoided Outcome: Progressing   Problem: Activity: Goal: Risk for activity intolerance will decrease Outcome: Progressing   Problem: Elimination: Goal: Will not experience complications related to urinary retention Outcome: Progressing   Problem: Pain Managment: Goal: General experience of comfort will improve and/or be controlled Outcome: Progressing   Problem: Tissue Perfusion: Goal: Adequacy of tissue perfusion will improve Outcome: Progressing

## 2024-05-12 NOTE — Progress Notes (Signed)
 Rounding Note   Patient Name: Jose Joseph Date of Encounter: 05/12/2024  [facility] Cardiologist: Vishnu P Mallipeddi, MD   Subjective  Pt converted back to Afib with RVR in the 120s yesterday at approximately 1511. He remains in Afib.  Scheduled Meds:  binimetinib   45 mg Oral BID   budesonide -glycopyrrolate -formoterol   2 puff Inhalation BID   Chlorhexidine  Gluconate Cloth  6 each Topical Daily   Chlorhexidine  Gluconate Cloth  6 each Topical Daily   citalopram   20 mg Oral q AM   dapagliflozin propanediol  10 mg Oral Daily   encorafenib   450 mg Oral QHS   feeding supplement  237 mL Oral BID BM   insulin  aspart  0-9 Units Subcutaneous TID WC   metoprolol  succinate  50 mg Oral Daily   potassium chloride   40 mEq Oral Q4H   sodium chloride  flush  10-40 mL Intracatheter Q12H   tamsulosin   0.4 mg Oral Daily   Warfarin - Pharmacist Dosing Inpatient   Does not apply q1600   Continuous Infusions:  amiodarone  60 mg/hr (05/12/24 0614)   potassium chloride  10 mEq (05/12/24 0851)   PRN Meds: acetaminophen , mouth rinse, oxyCODONE , sodium chloride  flush   Vital Signs  Vitals:   05/11/24 2012 05/11/24 2250 05/12/24 0450 05/12/24 0734  BP:  103/78 103/69 120/86  Pulse:   (!) 107 (!) 118  Resp:   (!) 24 20  Temp:  98.1 F (36.7 C) 97.7 F (36.5 C) 99.3 F (37.4 C)  TempSrc:  Oral Oral Oral  SpO2: 93%  95% 92%  Weight:      Height:        Intake/Output Summary (Last 24 hours) at 05/12/2024 0927 Last data filed at 05/12/2024 0735 Gross per 24 hour  Intake 531.63 ml  Output 1850 ml  Net -1318.37 ml      05/11/2024   12:41 PM 05/10/2024    3:06 AM 05/09/2024    4:33 AM  Last 3 Weights  Weight (lbs) 262 lb 12.6 oz 262 lb 9.1 oz 266 lb 5.1 oz  Weight (kg) 119.2 kg 119.1 kg 120.8 kg      Telemetry Converted SR --> Afib RVR with rates in the 120s - Personally Reviewed  ECG  No new tracings - Personally Reviewed  Physical Exam  GEN: No acute distress.    Neck: No JVD Cardiac: irregular rhythm, tachycardic rate  Respiratory: Clear to auscultation bilaterally. GI: Soft, nontender, non-distended  MS: No edema; No deformity. Neuro:  Nonfocal  Psych: Normal affect   Labs High Sensitivity Troponin:   Recent Labs  Lab 05/01/24 1020 05/01/24 1231 05/09/24 0455 05/09/24 0642  TROPONINIHS 8 6 320* 452*     Chemistry Recent Labs  Lab 05/10/24 0429 05/11/24 0455 05/12/24 0704  NA 135 136 136  K 3.8 3.9 3.8  CL 100 98 97*  CO2 25 24 26   GLUCOSE 113* 108* 108*  BUN 25* 34* 30*  CREATININE 1.07 1.38* 1.22  CALCIUM 9.1 9.1 9.0  MG 1.8 1.9 2.1  GFRNONAA >60 55* >60  ANIONGAP 10 14 13     Lipids No results for input(s): CHOL, TRIG, HDL, LABVLDL, LDLCALC, CHOLHDL in the last 168 hours.  Hematology Recent Labs  Lab 05/10/24 0429 05/11/24 0455 05/12/24 0704  WBC 17.4* 15.9* 13.7*  RBC 4.14* 3.97* 4.11*  HGB 12.0* 11.6* 11.9*  HCT 37.4* 36.2* 36.8*  MCV 90.3 91.2 89.5  MCH 29.0 29.2 29.0  MCHC 32.1 32.0 32.3  RDW 12.8  12.9 13.1  PLT 176 189 190   Thyroid No results for input(s): TSH, FREET4 in the last 168 hours.  BNPNo results for input(s): BNP, PROBNP in the last 168 hours.  DDimer No results for input(s): DDIMER in the last 168 hours.   Radiology  No results found.   Cardiac Studies  Echo 05/01/24:  1. Left ventricular ejection fraction, by estimation, is 55 to 60%. The  left ventricle has normal function. The left ventricle has no regional  wall motion abnormalities. Left ventricular diastolic parameters are  consistent with Grade I diastolic  dysfunction (impaired relaxation).   2. Right ventricular systolic function is normal. The right ventricular  size is normal.   3. The mitral valve is normal in structure. No evidence of mitral valve  regurgitation. No evidence of mitral stenosis.   4. The aortic valve is tricuspid. There is mild calcification of the  aortic valve. There is mild  thickening of the aortic valve. Aortic valve  regurgitation is not visualized. Aortic valve sclerosis/calcification is  present, without any evidence of  aortic stenosis.   Patient Profile   68 y.o. male with HFpEF, PAF on coumadin  (initially diagnosed 12/2023), recurrent A-fib with successful DCCV 05/01/2024, hypertension, history of pericardial effusion on echo 01/12/2024 large; small on subsequent echocardiograms and resolved on echo 05/01/2024, COPD, asthma, history of CVA, stage IV metastatic lung cancer.  He presented to [facility] ED with complaints of chest pain found to be in A-fib with RVR.  He was initially cardioverted in the ER to NSR and admitted for further evaluation and treatment of seizures/hypertension.  Assessment & Plan   Afib with RVR Hx of PAF with prior cardioversions - failed TEE-DCCV on 05/10/24, but self-converted to NSR 05/11/24 - telemetry shows he converted back to Afib RVR in the 120s - continued IV amiodarone  with additional bolus - continue toprol  50 mg daily --> can try to increase this to 75 mg toprol , has not received 50 mg toprol  yet this morning - consider repeat DCCV early next week after adequate amiodarone  load - K 3.8, sCr 1.22 - EP weighed in and agreed with continuation of amiodarone  with periodic EKG to check QTc - Fortunately he is generally asymptomatic with his rate and rhythm and not volume up today on exam -Per Pharm.D., could use digoxin if needed with his chemotherapy medications   Chronic anticoagulation History of CVA Recent DCCV  - continue on coumadin  - lung cancer medications interact with DOAC - INR today 2.4 - per pharmacy   HFpEF - echo with LVEF 55-60% - diuresed with IV lasix  for hypervolemia in the setting of Afib RVR - initially weaned off O2, placed back on last night - continue farxiga - he denies orthopnea, no LE edema, feels restricted with his breathing - does not appear volume up - will hold on additional IV  lasix    Stage IV lung cancer - s/p radiation, now on braftovi  and mektovi  - many medication interactions - can use amiodarone , may be a candidate for flecainide     For questions or updates, please contact [facility] Please consult [email or web address] for contact info under       Signed, Jon Nat Hails, PA  05/12/2024, 9:27 AM

## 2024-05-12 NOTE — Progress Notes (Signed)
 PROGRESS NOTE    Jose Joseph  FMW:[id number] DOB: 02/26/1956 DOA: 05/09/2024 PCP: Sheryle Carwin, MD  Subjective: Pt seen and examined. Wife at bedside. Back in afib. Remains on IV amio. Transferred to 2-C yesterday. Otherwise feeling ok. Does not feel palpitations. No chest pain. Wants to go home.   Hospital Course: CC: chest pain HPI: 68 year old male with a history of persistent atrial fibrillation status post DCCV May 2025, metastatic lung adenocarcinoma, COPD, hypertension, stroke, depression, chronic HFpEF who presented to [facility] emergency department with complaint of ongoing chest pain.  Patient was noted to be in A-fib with RVR with heart rate in 140s, he was started on diltiazem  infusion, he continued to have chest pain, was started on nitroglycerin  infusion, cardiology evaluated the patient, decision was made to transfer patient to [facility] ICU.  Upon arrival patient noted to be in A-fib with RVR with HR in 140's, denies chest pain palpitation, shortness of breath, headache, fever, chills or other complaints  Stated he has been losing weight for last few months due to low appetite, lost 11 pounds in the last 2 months.  Also complained of increased urinary frequency, denies dysuria  Patient reported developing chest pain at 3:30 AM this morning while sitting in recliner, described as dull, 7/10, located midsternal, radiating to left arm.  Notes to be first episode of chest pain.  Associated with diaphoresis, lightheadedness, and dizziness.  CP improved at hospital once treated with morphine  and NTG.  Denied any SOB, orthopnea, palpitations, nausea/vomiting, edema, syncope.  Patient denies ever feeling atrial fibrillation and still does not.  Reports daily medication compliance.  Activity is limited due to back pain associated with sciatica nerve.   Presented to [facility] ED 05/09/2024 for chest pain. TN 320 > 452,  K3.8, CR 1.1, WBC 16.2, Hgb 12.9, INR 1.6, prothrombin 20 EKG: Afib  w/ RVR, HR 123, PVC CXR with no acute findings; C/W atelectasis Treated with Cardizem  drip, Lopressor  IV 2.5 mg x1, ASA 324 mg, morphine  and NTG drip.  ED consulted with cardiology Dr. Shlomo who recommended transfer to [facility].  Significant Events: Admitted 05/09/2024 to CICU by PCCM due to rapid afib and demand cardiac ischemia 05-09-2024 placed on IV amiodaronge 05-10-2024 failed DCCV. Remains in rapid afib 05-08-2021 converted to NSR. Remains on IV amio. Cards gives approval to transfer to progressive care bed.  Admission Labs: WBC 16.2, HgB 12.9, plt 176 Na 137, K 3.8, CO2 of 23, BUN 28, Scr 1.11, glu 110 INR 1.6 A1c 5.2% UA small HgB, negative nitrite, negative LE, WBC 0-5, Rare bacteria Troponin I 320 >> 452  Admission Imaging Studies: CXR No acute findings. 2. Low lung volume with linear atelectasis/scarring in the right mid lung zone and left lung.  Significant Labs:   Significant Imaging Studies:   Antibiotic Therapy: Anti-infectives (From admission, onward)    None       Procedures:   Consultants: PCCM Cardiology EP/Cards    Assessment and Plan: * Demand ischemia (HCC) 05/10/24 cardiology has deemed pt's elevated troponin due to demand ischemia. NOT NSTEMI/ACS.   Atrial fibrillation with RVR (HCC) 05/10/24 Went for DCCV today. Unsuccessful. Pt still in rapid afib. Remains on IV heparin  and IV amiodarone  gtts. Cards plans on continue IV amiodarone  through the weekend.  05/11/24 converted to NSR. Continue with po coumadin .  05/12/24 flipped back into rapid afib. Remains on IV amio gtts and po coumadin . EP/Cards to decide on timing of repeat trial of DCCV. K  3.8 and Mg 2.1. will give more Kcl both IV and PO.   Long term current use of anticoagulant 05/10/24 on coumadin  at home. Was sub-therapeutic on admission with INR 1.6. on IV heparin  currently.  05/11/24 on po coumadin . INR 2.4. IV heparin  off since yesterday evening.  05/12/24 INR 2.4  today. Remains on coumadin     Malignant neoplasm of lung (HCC) 05/10/24 chronic. Followed by [facility].  05/12/24 stable    Chronic obstructive pulmonary disease (HCC) 05/10/24 currently not exacerbated. Continue with inhalers  05/11/24 stable. On inhalers.  05/12/24 stable. Not exacerbated     Obesity, Class I, BMI 30-34.9 Body mass index is 34.64 kg/m.   Lung cancer metastatic to bone (HCC) 05/10/24 followed by oncology.  05/11/24 stable. F/u with oncology  05/12/24 stable   DVT prophylaxis:   Coumadin    Code Status: Prior Family Communication: discussed with wife at bedside Disposition Plan: home Reason for continuing need for hospitalization: remains on IV Amio. Getting IV Kcl today.  Objective: Vitals:   05/11/24 2250 05/12/24 0450 05/12/24 0734 05/12/24 0938  BP: 103/78 103/69 120/86   Pulse:  (!) 107 (!) 118 (!) 128  Resp:  (!) 24 20   Temp: 98.1 F (36.7 C) 97.7 F (36.5 C) 99.3 F (37.4 C)   TempSrc: Oral Oral Oral   SpO2:  95% 92%   Weight:      Height:        Intake/Output Summary (Last 24 hours) at 05/12/2024 1044 Last data filed at 05/12/2024 0735 Gross per 24 hour  Intake 502.23 ml  Output 1850 ml  Net -1347.77 ml   Filed Weights   05/09/24 0433 05/10/24 0306 05/11/24 1241  Weight: 120.8 kg 119.1 kg 119.2 kg    Examination:  Physical Exam Vitals and nursing note reviewed.  Constitutional:      General: He is not in acute distress.    Appearance: He is obese. He is not toxic-appearing.  HENT:     Nose: Nose normal.  Cardiovascular:     Rate and Rhythm: Tachycardia present. Rhythm irregular.  Pulmonary:     Effort: Pulmonary effort is normal. No respiratory distress.  Abdominal:     General: Bowel sounds are normal.     Palpations: Abdomen is soft.  Musculoskeletal:     Right lower leg: No edema.     Left lower leg: No edema.  Skin:    General: Skin is warm and dry.     Capillary Refill: Capillary refill takes  less than 2 seconds.  Neurological:     General: No focal deficit present.     Mental Status: He is alert and oriented to person, place, and time.     Data Reviewed: I have personally reviewed following labs and imaging studies  CBC: Recent Labs  Lab 05/09/24 0455 05/10/24 0429 05/11/24 0455 05/12/24 0704  WBC 16.2* 17.4* 15.9* 13.7*  HGB 12.9* 12.0* 11.6* 11.9*  HCT 39.8 37.4* 36.2* 36.8*  MCV 90.9 90.3 91.2 89.5  PLT 176 176 189 190   Basic Metabolic Panel: Recent Labs  Lab 05/09/24 0455 05/10/24 0429 05/11/24 0455 05/12/24 0704  NA 137 135 136 136  K 3.8 3.8 3.9 3.8  CL 100 100 98 97*  CO2 23 25 24 26   GLUCOSE 110* 113* 108* 108*  BUN 28* 25* 34* 30*  CREATININE 1.11 1.07 1.38* 1.22  CALCIUM 9.3 9.1 9.1 9.0  MG  --  1.8 1.9 2.1   GFR: Estimated Creatinine  Clearance: 76.2 mL/min (by C-G formula based on SCr of 1.22 mg/dL). Coagulation Profile: Recent Labs  Lab 05/09/24 0455 05/10/24 1250 05/11/24 0455 05/12/24 0704  INR 1.6* 2.7* 2.4* 2.4*   BNP (last 3 results) Recent Labs    12/27/23 0408  BNP 71.4   HbA1C: Recent Labs    05/09/24 1135  HGBA1C 5.2   CBG: Recent Labs  Lab 05/11/24 0634 05/11/24 1149 05/11/24 1614 05/11/24 2105 05/12/24 0449  GLUCAP 117* 121* 105* 134* 112*   Recent Results (from the past 240 hours)  MRSA Next Gen by PCR, Nasal     Status: None   Collection Time: 05/09/24  9:35 AM   Specimen: Nasal Mucosa; Nasal Swab  Result Value Ref Range Status   MRSA by PCR Next Gen NOT DETECTED NOT DETECTED Final    Comment: (NOTE) The GeneXpert MRSA Assay (FDA approved for NASAL specimens only), is one component of a comprehensive MRSA colonization surveillance program. It is not intended to diagnose MRSA infection nor to guide or monitor treatment for MRSA infections. Test performance is not FDA approved in patients less than 19 years old. Performed at [facility], [address] [place], [place] [address]       Radiology Studies: No results found.  Scheduled Meds:  binimetinib   45 mg Oral BID   budesonide -glycopyrrolate -formoterol   2 puff Inhalation BID   Chlorhexidine  Gluconate Cloth  6 each Topical Daily   Chlorhexidine  Gluconate Cloth  6 each Topical Daily   citalopram   20 mg Oral q AM   dapagliflozin propanediol  10 mg Oral Daily   encorafenib   450 mg Oral QHS   feeding supplement  237 mL Oral BID BM   insulin  aspart  0-9 Units Subcutaneous TID WC   metoprolol  succinate  50 mg Oral Daily   potassium chloride   40 mEq Oral Q4H   sodium chloride  flush  10-40 mL Intracatheter Q12H   tamsulosin   0.4 mg Oral Daily   Warfarin - Pharmacist Dosing Inpatient   Does not apply q1600   Continuous Infusions:  amiodarone  60 mg/hr (05/12/24 0614)     LOS: 3 days   Time spent: 55 minutes  Camellia Door, DO  [facility]  05/12/2024, 10:44 AM

## 2024-05-13 ENCOUNTER — Encounter (HOSPITAL_COMMUNITY)
Admission: EM | Disposition: A | Discharge status: Home Health | Payer: Self-pay | Source: Home / Self Care | Attending: Internal Medicine

## 2024-05-13 ENCOUNTER — Inpatient Hospital Stay (HOSPITAL_COMMUNITY)

## 2024-05-13 ENCOUNTER — Encounter (HOSPITAL_COMMUNITY): Payer: Self-pay | Admitting: Anesthesiology

## 2024-05-13 ENCOUNTER — Encounter

## 2024-05-13 DIAGNOSIS — I4891 Unspecified atrial fibrillation: Secondary | ICD-10-CM | POA: Diagnosis not present

## 2024-05-13 DIAGNOSIS — I5033 Acute on chronic diastolic (congestive) heart failure: Secondary | ICD-10-CM | POA: Diagnosis not present

## 2024-05-13 DIAGNOSIS — J449 Chronic obstructive pulmonary disease, unspecified: Secondary | ICD-10-CM | POA: Diagnosis not present

## 2024-05-13 DIAGNOSIS — I2489 Other forms of acute ischemic heart disease: Secondary | ICD-10-CM | POA: Diagnosis not present

## 2024-05-13 DIAGNOSIS — Z7901 Long term (current) use of anticoagulants: Secondary | ICD-10-CM | POA: Diagnosis not present

## 2024-05-13 LAB — BASIC METABOLIC PANEL WITH GFR
Anion gap: 8 (ref 5–15)
BUN: 28 mg/dL — ABNORMAL HIGH (ref 8–23)
CO2: 25 mmol/L (ref 22–32)
Calcium: 9.2 mg/dL (ref 8.9–10.3)
Chloride: 100 mmol/L (ref 98–111)
Creatinine, Ser: 1.39 mg/dL — ABNORMAL HIGH (ref 0.61–1.24)
GFR, Estimated: 55 mL/min — ABNORMAL LOW (ref >60)
Glucose, Bld: 101 mg/dL — ABNORMAL HIGH (ref 70–99)
Potassium: 4.5 mmol/L (ref 3.5–5.1)
Sodium: 133 mmol/L — ABNORMAL LOW (ref 135–145)

## 2024-05-13 LAB — CBC
HCT: 36.4 % — ABNORMAL LOW (ref 39.0–52.0)
Hemoglobin: 11.6 g/dL — ABNORMAL LOW (ref 13.0–17.0)
MCH: 28.8 pg (ref 26.0–34.0)
MCHC: 31.9 g/dL (ref 30.0–36.0)
MCV: 90.3 fL (ref 80.0–100.0)
Platelets: 193 K/uL (ref 150–400)
RBC: 4.03 MIL/uL — ABNORMAL LOW (ref 4.22–5.81)
RDW: 13 % (ref 11.5–15.5)
WBC: 15.8 K/uL — ABNORMAL HIGH (ref 4.0–10.5)
nRBC: 0 % (ref 0.0–0.2)

## 2024-05-13 LAB — HEPATIC FUNCTION PANEL
ALT: 18 U/L (ref 0–44)
AST: 22 U/L (ref 15–41)
Albumin: 2.4 g/dL — ABNORMAL LOW (ref 3.5–5.0)
Alkaline Phosphatase: 71 U/L (ref 38–126)
Bilirubin, Direct: <0.1 mg/dL (ref 0.0–0.2)
Total Bilirubin: 0.3 mg/dL (ref 0.0–1.2)
Total Protein: 6.1 g/dL — ABNORMAL LOW (ref 6.5–8.1)

## 2024-05-13 LAB — GLUCOSE, CAPILLARY
Glucose-Capillary: 94 mg/dL (ref 70–99)
Glucose-Capillary: 123 mg/dL — ABNORMAL HIGH (ref 70–99)
Glucose-Capillary: 106 mg/dL — ABNORMAL HIGH (ref 70–99)
Glucose-Capillary: 108 mg/dL — ABNORMAL HIGH (ref 70–99)

## 2024-05-13 LAB — PROTIME-INR
INR: 2.6 — ABNORMAL HIGH (ref 0.8–1.2)
Prothrombin Time: 28.8 s — ABNORMAL HIGH (ref 11.4–15.2)

## 2024-05-13 LAB — MAGNESIUM: Magnesium: 2.1 mg/dL (ref 1.7–2.4)

## 2024-05-13 SURGERY — CARDIOVERSION (CATH LAB)
Anesthesia: General

## 2024-05-13 MED ORDER — OXYCODONE HCL 5 MG PO TABS
10.0000 mg | ORAL_TABLET | ORAL | Status: DC | PRN
Start: 2024-05-13 — End: 2024-05-14
  Administered 2024-05-13 – 2024-05-14 (×5): 10 mg via ORAL
  Filled 2024-05-13 (×5): qty 2

## 2024-05-13 MED ORDER — CEFADROXIL 500 MG PO CAPS
500.0000 mg | ORAL_CAPSULE | Freq: Two times a day (BID) | ORAL | Status: DC
  Administered 2024-05-13 – 2024-05-14 (×2): 500 mg via ORAL
  Filled 2024-05-13 (×3): qty 1

## 2024-05-13 MED ORDER — AMIODARONE HCL 200 MG PO TABS
200.0000 mg | ORAL_TABLET | Freq: Two times a day (BID) | ORAL | Status: DC
  Administered 2024-05-13 – 2024-05-14 (×3): 200 mg via ORAL
  Filled 2024-05-13 (×3): qty 1

## 2024-05-13 MED ORDER — AMIODARONE HCL 200 MG PO TABS: 200.0000 mg | ORAL_TABLET | Freq: Every day | ORAL | Status: DC

## 2024-05-13 MED ORDER — WARFARIN SODIUM 2 MG PO TABS
4.0000 mg | ORAL_TABLET | Freq: Once | ORAL | Status: AC
  Administered 2024-05-13: 4 mg via ORAL
  Filled 2024-05-13: qty 2

## 2024-05-13 NOTE — Care Management Important Message (Signed)
 Important Message  Patient Details  Name: Jose Joseph MRN: [id number] Date of Birth: 03-23-1956   Important Message Given:  Yes - Medicare IM     Claretta Deed 05/13/2024, 2:57 PM

## 2024-05-13 NOTE — Plan of Care (Signed)

## 2024-05-13 NOTE — Progress Notes (Signed)
 PHARMACY - ANTICOAGULATION CONSULT NOTE  Pharmacy Consult for heparin  drip and warfarin Indication: atrial fibrillation  Allergies  Allergen Reactions   Bee Venom Anaphylaxis    syncope    Patient Measurements: Height: 6' 1 (185.4 cm) Weight: 119.2 kg (262 lb 12.6 oz) IBW/kg (Calculated) : 79.9 HEPARIN  DW (KG): 105.7  Vital Signs: Temp: 98.2 F (36.8 C) (09/29 0300) Temp Source: Oral (09/29 0300) BP: 96/54 (09/29 0300) Pulse Rate: 76 (09/29 0300)  Labs: Recent Labs    05/10/24 1250 05/10/24 1250 05/11/24 0455 05/12/24 0704 05/13/24 0219  HGB  --    < > 11.6* 11.9* 11.6*  HCT  --   --  36.2* 36.8* 36.4*  PLT  --   --  189 190 193  LABPROT 29.8*  --  27.3* 27.5* 28.8*  INR 2.7*  --  2.4* 2.4* 2.6*  HEPARINUNFRC 0.34  --   --   --   --   CREATININE  --   --  1.38* 1.22 1.39*   < > = values in this interval not displayed.    Estimated Creatinine Clearance: 66.9 mL/min (A) (by C-G formula based on SCr of 1.39 mg/dL (H)).   Medical History: Past Medical History:  Diagnosis Date   Arthritis    Asthma    Atrial fibrillation (HCC)    Cancer (HCC)    Complication of anesthesia    after his neck surgery pt. trying to get up and walk out   COPD (chronic obstructive pulmonary disease) (HCC)    COPD with acute exacerbation (HCC) 12/15/2023   Depression    History of kidney stones    2021   History of stroke 12/26/2023   Hypertension    Metastatic lung carcinoma, right (HCC) 08/2023   Pericardial effusion 12/26/2023   Status post total replacement of left hip 01/15/2021   Stroke [facility]      Assessment: 68 yoM with hx AF on warfarin PTA admitted with AF RVR and CP. Pt had AF DCCV 9/18, also has hx CVA. INR on admit low at 1.6, to heparin  bridge started now off, warfarin resumed.  INR therapeutic at 2.6. Home dose is 10mg  daily except 5mg  Mon/Wed/Fri. New amiodarone  will reduce warfarin requirements.  Goal of Therapy:  INR 2-3 Monitor platelets by  anticoagulation protocol: Yes   Plan:  -Warfarin 4mg  PO x1 -Daily INR  Donny Alert, PharmD, [facility] Clinical Pharmacist Please see AMION for all Pharmacists' Contact Phone Numbers 05/13/2024, 7:22 AM

## 2024-05-13 NOTE — TOC Progression Note (Addendum)
 Transition of Care [facility]) - Progression Note    Patient Details  Name: Jose Joseph MRN: [id number] Date of Birth: August 11, 1956  Transition of Care [facility]) CM/SW Contact  Roxie KANDICE Stain, RN Phone Number: 05/13/2024, 12:57 PM  Clinical Narrative:    Patient declines home health at this time stating his wife is a retired Engineer, civil (consulting).   Expected Discharge Plan: Home/Self Care Barriers to Discharge: Continued Medical Work up               Expected Discharge Plan and Services   Discharge Planning Services: CM Consult Post Acute Care Choice: Durable Medical Equipment Living arrangements for the past 2 months: Single Family Home                                       Social Drivers of Health (SDOH) Interventions SDOH Screenings   Food Insecurity: No Food Insecurity (05/09/2024)  Housing: Low Risk  (05/09/2024)  Transportation Needs: No Transportation Needs (05/09/2024)  Utilities: Not At Risk (05/09/2024)  Depression (PHQ2-9): Low Risk  (05/06/2024)  Social Connections: Moderately Isolated (05/09/2024)  Tobacco Use: Medium Risk (05/09/2024)    Readmission Risk Interventions    05/03/2024   10:26 AM  Readmission Risk Prevention Plan  Transportation Screening Complete  HRI or Home Care Consult Complete  Social Work Consult for Recovery Care Planning/Counseling Complete  Palliative Care Screening Not Applicable  Medication Review Oceanographer) Complete

## 2024-05-13 NOTE — Evaluation (Signed)
 Occupational Therapy Evaluation Patient Details Name: Jose Joseph MRN: [id number] DOB: 02-28-1956 Today's Date: 05/13/2024   History of Present Illness   68 year old male who presented 9/25 to [facility] emergency department with complaint of ongoing chest pain. Patient was noted to be in A-fib with RVR. Transferred to [facility] 09/26 failed DCCV. 9/24 converted to NSR. PMH: atrial fibrillation status post DCCV May 2025, metastatic lung adenocarcinoma, COPD, hypertension, stroke, depression, chronic HFpEF.     Clinical Impressions Pt is typically independent. He lives with his wife and her special needs son. Presents with 7/10 back pain, recent L shoulder limitations, generalized weakness and decreased activity tolerance. Pt requires CGA with rollator for ambulation with verbal cues for safety. He needs set up to min assist for ADLs. Pt reports his walk in shower will not accommodate a shower seat and his second bathroom has a garden tub. Recommending HHOT upon discharge, will follow acutely.      If plan is discharge home, recommend the following:   A little help with walking and/or transfers;A little help with bathing/dressing/bathroom;Assistance with cooking/housework;Assist for transportation;Help with stairs or ramp for entrance     Functional Status Assessment   Patient has had a recent decline in their functional status and demonstrates the ability to make significant improvements in function in a reasonable and predictable amount of time.     Equipment Recommendations   None recommended by OT     Recommendations for Other Services         Precautions/Restrictions   Precautions Precautions: Fall Recall of Precautions/Restrictions: Intact Restrictions Weight Bearing Restrictions Per Provider Order: No     Mobility Bed Mobility Overal bed mobility: Modified Independent             General bed mobility comments: cues for log roll technique to  minimize back pain    Transfers Overall transfer level: Needs assistance Equipment used: Rollator (4 wheels) Transfers: Sit to/from Stand Sit to Stand: Contact guard assist, From elevated surface           General transfer comment: elevated bed, cues for safe rollator use      Balance Overall balance assessment: Needs assistance   Sitting balance-Leahy Scale: Good       Standing balance-Leahy Scale: Fair Standing balance comment: Prefers UE support due to back pain                           ADL either performed or assessed with clinical judgement   ADL Overall ADL's : Needs assistance/impaired Eating/Feeding: Independent;Sitting   Grooming: Wash/dry hands;Wash/dry face;Oral care;Sitting;Set up   Upper Body Bathing: Set up;Sitting   Lower Body Bathing: Minimal assistance;Sit to/from stand   Upper Body Dressing : Set up;Sitting   Lower Body Dressing: Minimal assistance;Sit to/from stand Lower Body Dressing Details (indicate cue type and reason): can perform figure 4 to reach feet Toilet Transfer: Contact guard assist;Rollator (4 wheels);BSC/3in1           Functional mobility during ADLs: Contact guard assist;Rollator (4 wheels)       Vision Ability to See in Adequate Light: 0 Adequate Patient Visual Report: No change from baseline       Perception         Praxis         Pertinent Vitals/Pain Pain Assessment Pain Assessment: 0-10 Pain Score: 7  Pain Location: back Pain Descriptors / Indicators: Aching Pain Intervention(s): Monitored during session, Repositioned, Patient  requesting pain meds-RN notified, Heat applied     Extremity/Trunk Assessment Upper Extremity Assessment Upper Extremity Assessment: Right hand dominant;LUE deficits/detail LUE Deficits / Details: active shoulder limitations, pt reports is recent, full PROM LUE Coordination: decreased gross motor   Lower Extremity Assessment Lower Extremity Assessment: Defer to PT  evaluation   Cervical / Trunk Assessment Cervical / Trunk Assessment: Other exceptions Cervical / Trunk Exceptions: recent onset of back pain   Communication Communication Communication: No apparent difficulties   Cognition Arousal: Alert Behavior During Therapy: WFL for tasks assessed/performed Cognition: No apparent impairments                               Following commands: Intact       Cueing  General Comments   Cueing Techniques: Verbal cues      Exercises     Shoulder Instructions      Home Living Family/patient expects to be discharged to:: Private residence Living Arrangements: Spouse/significant other Available Help at Discharge: Family;Available 24 hours/day Type of Home: House Home Access: Stairs to enter [employer] of Steps: 3 Entrance Stairs-Rails: Right;Left Home Layout: One level     Bathroom Shower/Tub: Producer, television/film/video: Standard     Home Equipment: Rollator (4 wheels);Cane - single point;Shower seat          Prior Functioning/Environment Prior Level of Function : Independent/Modified Independent;Driving             Mobility Comments: Independent, back pain 3 weeks ago, ongoing, now using [facility] for this reason. ADLs Comments: ind., mows lawn    OT Problem List: Decreased strength;Decreased activity tolerance;Impaired balance (sitting and/or standing);Decreased knowledge of precautions;Pain;Impaired UE functional use   OT Treatment/Interventions: Self-care/ADL training;Therapeutic exercise;DME and/or AE instruction;Therapeutic activities;Patient/family education;Balance training      OT Goals(Current goals can be found in the care plan section)   Acute Rehab OT Goals OT Goal Formulation: With patient Time For Goal Achievement: 05/27/24 Potential to Achieve Goals: Good ADL Goals Pt Will Perform Grooming: with modified independence;standing Pt Will Perform Lower Body Bathing: with  modified independence;sit to/from stand Pt Will Perform Lower Body Dressing: with modified independence;sit to/from stand Pt Will Transfer to Toilet: with modified independence;ambulating Pt Will Perform Toileting - Clothing Manipulation and hygiene: with modified independence;sit to/from stand Pt/caregiver will Perform Home Exercise Program: Increased strength;Left upper extremity;With Supervision;With written HEP provided (L shoulder) Additional ADL Goal #1: Pt will complete bed mobility using log roll technique to minimize back pain.   OT Frequency:  Min 2X/week    Co-evaluation              AM-PAC OT 6 Clicks Daily Activity     Outcome Measure Help from another person eating meals?: None Help from another person taking care of personal grooming?: A Little Help from another person toileting, which includes using toliet, bedpan, or urinal?: A Little Help from another person bathing (including washing, rinsing, drying)?: A Little Help from another person to put on and taking off regular upper body clothing?: A Little Help from another person to put on and taking off regular lower body clothing?: A Little 6 Click Score: 19   End of Session Equipment Utilized During Treatment: Gait belt;Rollator (4 wheels) Nurse Communication: Patient requests pain meds  Activity Tolerance: Patient limited by pain Patient left: in chair;with call bell/phone within reach;with nursing/sitter in room  OT Visit Diagnosis: Unsteadiness on feet (R26.81);Other abnormalities  of gait and mobility (R26.89);Pain                Time: 9078-9057 OT Time Calculation (min): 21 min Charges:  OT General Charges $OT Visit: 1 Visit OT Evaluation $OT Eval Moderate Complexity: 1 Mod  Mliss HERO, OTR/L Acute Rehabilitation Services Office: [phone]   Kennth Mliss Helling 05/13/2024, 9:58 AM

## 2024-05-13 NOTE — Progress Notes (Signed)
 Physical Therapy Treatment Patient Details Name: Jose Joseph MRN: [id number] DOB: 31-Oct-1955 Today's Date: 05/13/2024   History of Present Illness 68 year old male who presented 9/25 to [facility] emergency department with complaint of ongoing chest pain. Patient was noted to be in A-fib with RVR. Transferred to [facility]. 09/26 failed DCCV. 9/24 converted to NSR. PMH: atrial fibrillation status post DCCV May 2025, metastatic lung adenocarcinoma, COPD, hypertension, stroke, depression, chronic HFpEF.    PT Comments  Pt received in supine, agreeable to therapy session only at bed level today due to c/o back pain. Discussion on benefits of mobility for reduction of back pain and pt agreeable to instruction on back precs/log rolling for comfort, use of IS (pt currently on 2L/min O2 [place] at rest), positioning for improved pulmonary clearance and pressure relief. Pt given supine HEP handout as well and agreeable to attempt TID as able. Discussed that pt will need to work more on OOB mobility following date and must demo ability to perform stairs prior to DC, pt agreeable. Pt continues to benefit from PT services to progress toward functional mobility goals, dispo update to OPPT as pt does not want HHPT services at this time, discussed with supervising PT Logan B.    If plan is discharge home, recommend the following: A little help with bathing/dressing/bathroom;Assistance with cooking/housework;Assist for transportation;Help with stairs or ramp for entrance;A little help with walking and/or transfers   Can travel by private vehicle        Equipment Recommendations  Rollator (4 wheels)    Recommendations for Other Services       Precautions / Restrictions Precautions Precautions: Fall;Back Precaution Booklet Issued: Yes (comment) Recall of Precautions/Restrictions: Intact Precaution/Restrictions Comments: Monitor HR and BP, has handout on back precs for comfort due to cancer related back pain  with bone mets Restrictions Weight Bearing Restrictions Per Provider Order: No     Mobility  Bed Mobility Overal bed mobility: Modified Independent             General bed mobility comments: cues for log roll technique to minimize back pain, pt given handout to reinforce; pt defers to attempt due to c/o pain in afternoon, RN/MD aware    Transfers                   General transfer comment: pt defers to attempt due to increased c/o back pain    Ambulation/Gait                   Stairs             Wheelchair Mobility     Tilt Bed    Modified Rankin (Stroke Patients Only)       Balance Overall balance assessment: Needs assistance     Sitting balance - Comments: pt defers in afternoon       Standing balance comment: pt defers                            Communication Communication Communication: No apparent difficulties  Cognition Arousal: Alert Behavior During Therapy: WFL for tasks assessed/performed   PT - Cognitive impairments: No apparent impairments                       PT - Cognition Comments: pt given handout on back precs (for comfort), IS use and supine HEP with fair carryover of education during session. Following commands: Intact  Cueing Cueing Techniques: Verbal cues  Exercises General Exercises - Lower Extremity Ankle Circles/Pumps: AROM, Both, 10 reps, Supine Heel Slides: AROM, Both, 5 reps, Supine (a few reps for teachback) Other Exercises Other Exercises: Pt performs IS x 10 reps, achieves 1700-2,000 mL encouraged hourly use    General Comments General comments (skin integrity, edema, etc.): SpO2 92-94% on 2L O2 [place] even with IS use; HR 80's bpm resting      Pertinent Vitals/Pain Pain Assessment Pain Assessment: Faces Faces Pain Scale: Hurts even more Pain Location: back Pain Descriptors / Indicators: Aching, Grimacing, Guarding, Discomfort Pain Intervention(s): Limited activity  within patient's tolerance, Monitored during session, Repositioned, Patient requesting pain meds-RN notified, Other (comment) (pt defers heating pack, requesting to wait for PO pain meds before getting up OOB)    Home Living                          Prior Function            PT Goals (current goals can now be found in the care plan section) Acute Rehab PT Goals Patient Stated Goal: Reduce back pain PT Goal Formulation: With patient Time For Goal Achievement: 05/24/24 Progress towards PT goals: Not progressing toward goals - comment (pain limiting him today)    Frequency    Min 2X/week      PT Plan      Co-evaluation              AM-PAC PT 6 Clicks Mobility   Outcome Measure  Help needed turning from your back to your side while in a flat bed without using bedrails?: None Help needed moving from lying on your back to sitting on the side of a flat bed without using bedrails?: A Little Help needed moving to and from a bed to a chair (including a wheelchair)?: A Little Help needed standing up from a chair using your arms (e.g., wheelchair or bedside chair)?: A Little Help needed to walk in hospital room?: A Little Help needed climbing 3-5 steps with a railing? : A Lot 6 Click Score: 18    End of Session Equipment Utilized During Treatment: Oxygen  Activity Tolerance: Patient limited by pain Patient left: in bed;with call bell/phone within reach;with bed alarm set;Other (comment) (pillow under LE, heels floated) Nurse Communication: Mobility status;Patient requests pain meds;Other (comment) (pt defers heating pack, requesting pain meds when due) PT Visit Diagnosis: Unsteadiness on feet (R26.81);Other abnormalities of gait and mobility (R26.89);Muscle weakness (generalized) (M62.81);Difficulty in walking, not elsewhere classified (R26.2);Pain Pain - part of body:  (back)     Time: 8390-8361 PT Time Calculation (min) (ACUTE ONLY): 29 min  Charges:     $Therapeutic Exercise: 8-22 mins $Therapeutic Activity: 8-22 mins PT General Charges $$ ACUTE PT VISIT: 1 Visit                     Kendel Bessey P., PTA Acute Rehabilitation Services Secure Chat Preferred 9a-5:30pm Office: [phone]    Connell HERO [facility] 05/13/2024, 6:02 PM

## 2024-05-13 NOTE — Progress Notes (Signed)
 Rounding Note   Patient Name: Jose Joseph Date of Encounter: 05/13/2024  [facility] Cardiologist: Vishnu P Mallipeddi, MD   Subjective - Patient endorses having fleeting episode of chest pain last night that lasted for few seconds.  No associated symptoms of SOB, diaphoresis, nausea.  He has never had this before and has not had it since. - No other concerns overnight or this morning  Scheduled Meds:  binimetinib   45 mg Oral BID   budesonide -glycopyrrolate -formoterol   2 puff Inhalation BID   Chlorhexidine  Gluconate Cloth  6 each Topical Daily   Chlorhexidine  Gluconate Cloth  6 each Topical Daily   citalopram   20 mg Oral q AM   dapagliflozin propanediol  10 mg Oral Daily   encorafenib   450 mg Oral QHS   feeding supplement  237 mL Oral BID BM   insulin  aspart  0-9 Units Subcutaneous TID WC   metoprolol  succinate  50 mg Oral Daily   sodium chloride  flush  10-40 mL Intracatheter Q12H   tamsulosin   0.4 mg Oral Daily   warfarin  4 mg Oral ONCE-1600   Warfarin - Pharmacist Dosing Inpatient   Does not apply q1600   Continuous Infusions:  amiodarone  60 mg/hr (05/13/24 1232)   PRN Meds: acetaminophen , mouth rinse, oxyCODONE , sodium chloride  flush   Vital Signs  Vitals:   05/13/24 0300 05/13/24 0723 05/13/24 0752 05/13/24 1125  BP: (!) 96/54  115/63 114/72  Pulse: 76 97 94 90  Resp: 20 16 (!) 21 18  Temp: 98.2 F (36.8 C)  97.8 F (36.6 C) 97.8 F (36.6 C)  TempSrc: Oral  Oral Oral  SpO2: 92% 93% 90% 93%  Weight:      Height:        Intake/Output Summary (Last 24 hours) at 05/13/2024 1242 Last data filed at 05/13/2024 0800 Gross per 24 hour  Intake 1623.21 ml  Output 800 ml  Net 823.21 ml      05/11/2024   12:41 PM 05/10/2024    3:06 AM 05/09/2024    4:33 AM  Last 3 Weights  Weight (lbs) 262 lb 12.6 oz 262 lb 9.1 oz 266 lb 5.1 oz  Weight (kg) 119.2 kg 119.1 kg 120.8 kg      Telemetry NSR- Personally Reviewed  ECG  ECG from 9/27 shows NSR-  Personally Reviewed  Physical Exam GEN: Atraumatic, normocephalic, no acute distress.   Neck: No JVD Cardiac: RRR, no murmurs, rubs, or gallops.  Respiratory: Clear to auscultation bilaterally, no wheezes, rales, rhonchi GI: Soft, nontender, non-distended  MS: No edema; No deformity. Neuro:  Nonfocal  Psych: Normal affect   Labs High Sensitivity Troponin:   Recent Labs  Lab 05/01/24 1020 05/01/24 1231 05/09/24 0455 05/09/24 0642  TROPONINIHS 8 6 320* 452*     Chemistry Recent Labs  Lab 05/11/24 0455 05/12/24 0704 05/13/24 0219  NA 136 136 133*  K 3.9 3.8 4.5  CL 98 97* 100  CO2 24 26 25   GLUCOSE 108* 108* 101*  BUN 34* 30* 28*  CREATININE 1.38* 1.22 1.39*  CALCIUM 9.1 9.0 9.2  MG 1.9 2.1 2.1  PROT  --   --  PENDING  ALBUMIN   --   --  2.4*  AST  --   --  22  ALT  --   --  18  ALKPHOS  --   --  71  BILITOT  --   --  0.3  GFRNONAA 55* >60 55*  ANIONGAP 14 13 8  Lipids No results for input(s): CHOL, TRIG, HDL, LABVLDL, LDLCALC, CHOLHDL in the last 168 hours.  Hematology Recent Labs  Lab 05/11/24 0455 05/12/24 0704 05/13/24 0219  WBC 15.9* 13.7* 15.8*  RBC 3.97* 4.11* 4.03*  HGB 11.6* 11.9* 11.6*  HCT 36.2* 36.8* 36.4*  MCV 91.2 89.5 90.3  MCH 29.2 29.0 28.8  MCHC 32.0 32.3 31.9  RDW 12.9 13.1 13.0  PLT 189 190 193   Thyroid No results for input(s): TSH, FREET4 in the last 168 hours.  BNPNo results for input(s): BNP, PROBNP in the last 168 hours.  DDimer No results for input(s): DDIMER in the last 168 hours.   Radiology  DG CHEST PORT 1 VIEW Result Date: 05/13/2024 CLINICAL DATA:  Respiratory failure. EXAM: PORTABLE CHEST 1 VIEW COMPARISON:  Chest CT dated 05/09/2024. FINDINGS: Right-sided Port-A-Cath in similar position. Interval progression of left perihilar densities may represent developing infiltrate. Clinical correlation and follow-up to resolution recommended. Right mid lung field linear atelectasis/scarring. No pleural  effusion or pneumothorax. Stable cardiac silhouette no acute osseous pathology. IMPRESSION: Interval progression of left perihilar densities may represent developing infiltrate. Electronically Signed   By: Vanetta Chou M.D.   On: 05/13/2024 14:16    Cardiac Studies I independently reviewed the patient's echocardiogram from 05/01/2024 and agree with the interpretation  Patient Profile   Jose Joseph is a 68 y.o. male with a PMH of PAF (on warfarin), HFpEF, HTN, prior CVA, COPD, and metastatic lung CA who was admitted for atrial fibrillation with RVR.  Assessment & Plan   #PAF w/ RVR - Patient admitted initially with atrial fibrillation with RVR.  She underwent DCCV on 9/18 which was unsuccessful. - Currently on an IV amiodarone  load for rhythm control and converted to NSR. - Given that he is now stable and in NSR, we will plan to convert his amiodarone  from IV to p.o. to complete a 10 g load - Start p.o. amiodarone  200 mg twice daily x5 days followed by 200 mg daily - Stop IV amiodarone  - Continue metoprolol  for now - Continue warfarin per pharmacy - Daily INR  #Acute on Chronic HFpEF - He appears euvolemic today to me.  He is net -1.5 L from overnight over the past 24 hours without diuresis. - No additional diuresis at this time - Continue to monitor strict I's and O's - Continue Farxiga 10 mg daily - Consider starting MRA  #Non-Cardiac Chest Pain - Fleeting episode of chest pain overnight that appears to be noncardiac in etiology.  No current ongoing symptoms.  Will CTM  #HTN - BPs are stable - No changes today  #Hypoxic Respiratory Failure - Patient says that he is not on O2 at home but is currently on 2 L. - In the setting of COPD his goal O2 is between 88-92%. - CXR shows no new infiltrates - I will order incentive spirometry      For questions or updates, please contact [facility] Please consult [email or web address] for contact info under        Signed, Georganna Archer, MD  05/13/2024, 12:42 PM

## 2024-05-13 NOTE — Progress Notes (Signed)
 PROGRESS NOTE    Jose Joseph  FMW:[id number] DOB: 07-11-1956 DOA: 05/09/2024 PCP: Sheryle Carwin, MD  Subjective: Pt seen and examined. Converted back to NSR last night. His DCCV was canceled. Remains on IV amiodarone  this AM when he was seen. Awaiting for him to be changed to po amiodarone .   Hospital Course: CC: chest pain HPI: 68 year old male with a history of persistent atrial fibrillation status post DCCV May 2025, metastatic lung adenocarcinoma, COPD, hypertension, stroke, depression, chronic HFpEF who presented to [facility] emergency department with complaint of ongoing chest pain.  Patient was noted to be in A-fib with RVR with heart rate in 140s, he was started on diltiazem  infusion, he continued to have chest pain, was started on nitroglycerin  infusion, cardiology evaluated the patient, decision was made to transfer patient to [facility], ICU.  Upon arrival patient noted to be in A-fib with RVR with HR in 140's, denies chest pain palpitation, shortness of breath, headache, fever, chills or other complaints  Stated he has been losing weight for last few months due to low appetite, lost 11 pounds in the last 2 months.  Also complained of increased urinary frequency, denies dysuria  Patient reported developing chest pain at 3:30 AM this morning while sitting in recliner, described as dull, 7/10, located midsternal, radiating to left arm.  Notes to be first episode of chest pain.  Associated with diaphoresis, lightheadedness, and dizziness.  CP improved at hospital once treated with morphine  and NTG.  Denied any SOB, orthopnea, palpitations, nausea/vomiting, edema, syncope.  Patient denies ever feeling atrial fibrillation and still does not.  Reports daily medication compliance.  Activity is limited due to back pain associated with sciatica nerve.   Presented to [facility] ED 05/09/2024 for chest pain. TN 320 > 452,  K3.8, CR 1.1, WBC 16.2, Hgb 12.9, INR 1.6, prothrombin 20 EKG: Afib w/  RVR, HR 123, PVC CXR with no acute findings; C/W atelectasis Treated with Cardizem  drip, Lopressor  IV 2.5 mg x1, ASA 324 mg, morphine  and NTG drip.  ED consulted with cardiology Dr. Shlomo who recommended transfer to [facility].  Significant Events: Admitted 05/09/2024 to CICU by PCCM due to rapid afib and demand cardiac ischemia 05-09-2024 placed on IV amiodaronge 05-10-2024 failed DCCV. Remains in rapid afib 05-08-2021 converted to NSR. Remains on IV amio. Cards gives approval to transfer to progressive care bed.  Admission Labs: WBC 16.2, HgB 12.9, plt 176 Na 137, K 3.8, CO2 of 23, BUN 28, Scr 1.11, glu 110 INR 1.6 A1c 5.2% UA small HgB, negative nitrite, negative LE, WBC 0-5, Rare bacteria Troponin I 320 >> 452  Admission Imaging Studies: CXR No acute findings. 2. Low lung volume with linear atelectasis/scarring in the right mid lung zone and left lung.  Significant Labs:   Significant Imaging Studies:   Antibiotic Therapy: Anti-infectives (From admission, onward)    None       Procedures:   Consultants: PCCM Cardiology EP/Cards    Assessment and Plan: * Demand ischemia (HCC) 05/10/24 cardiology has deemed pt's elevated troponin due to demand ischemia. NOT NSTEMI/ACS.   Atrial fibrillation with RVR (HCC) 05/10/24 Went for DCCV today. Unsuccessful. Pt still in rapid afib. Remains on IV heparin  and IV amiodarone  gtts. Cards plans on continue IV amiodarone  through the weekend.  05/11/24 converted to NSR. Continue with po coumadin .  05/12/24 flipped back into rapid afib. Remains on IV amio gtts and po coumadin . EP/Cards to decide on timing of repeat trial of DCCV.  K 3.8 and Mg 2.1. will give more Kcl both IV and PO.  05/13/24 Converted back to NSR last night. His DCCV was canceled. Remains on IV amiodarone  this AM when he was seen. Awaiting for him to be changed to po amiodarone  by cardiology.   Long term current use of anticoagulant 05/10/24 on coumadin  at  home. Was sub-therapeutic on admission with INR 1.6. on IV heparin  currently.  05/11/24 on po coumadin . INR 2.4. IV heparin  off since yesterday evening.  05/12/24 INR 2.4 today. Remains on coumadin   05/13/24 INR 2.6 today. On coumadin .     Malignant neoplasm of lung (HCC) 05/10/24 chronic. Followed by [facility].  05/12/24 stable  05/13/24 per his CT abd/pelvis in 02-2024 he has bony mets to his sacrum. He needs to f/u with his oncologist  Chronic obstructive pulmonary disease (HCC) 05/10/24 currently not exacerbated. Continue with inhalers  05/11/24 stable. On inhalers.  05/12/24 stable. Not exacerbated  05/13/24 stable. Not exacerbated   Obesity, Class I, BMI 30-34.9 Body mass index is 34.64 kg/m.   Lung cancer metastatic to bone (HCC) 05/10/24 followed by oncology.  05/11/24 stable. F/u with oncology  05/12/24 stable  05/13/24 per his CT abd/pelvis in 02-2024 he has bony mets to his sacrum. He needs to f/u with his oncologist    DVT prophylaxis:  warfarin (COUMADIN ) tablet 4 mg    Code Status: Prior Family Communication: no family at bedside today. Spoke with wife yesterday at bedside Disposition Plan: home vs snf Reason for continuing need for hospitalization: awaiting for IV amiodarone  to be changed to po amio. Needs PT assessment for safety at home.  Objective: Vitals:   05/13/24 0300 05/13/24 0723 05/13/24 0752 05/13/24 1125  BP: (!) 96/54  115/63 114/72  Pulse: 76 97 94 90  Resp: 20 16 (!) 21 18  Temp: 98.2 F (36.8 C)  97.8 F (36.6 C) 97.8 F (36.6 C)  TempSrc: Oral  Oral Oral  SpO2: 92% 93% 90% 93%  Weight:      Height:        Intake/Output Summary (Last 24 hours) at 05/13/2024 1219 Last data filed at 05/13/2024 0800 Gross per 24 hour  Intake 1623.21 ml  Output 800 ml  Net 823.21 ml   Filed Weights   05/09/24 0433 05/10/24 0306 05/11/24 1241  Weight: 120.8 kg 119.1 kg 119.2 kg    Examination:  Physical Exam Vitals and nursing  note reviewed.  Constitutional:      General: He is not in acute distress.    Appearance: He is not toxic-appearing.  HENT:     Head: Normocephalic and atraumatic.  Cardiovascular:     Rate and Rhythm: Normal rate and regular rhythm.  Pulmonary:     Effort: Pulmonary effort is normal.     Breath sounds: Normal breath sounds.  Abdominal:     General: Abdomen is flat. Bowel sounds are normal.  Musculoskeletal:     Right lower leg: No edema.     Left lower leg: No edema.  Skin:    General: Skin is warm and dry.     Capillary Refill: Capillary refill takes less than 2 seconds.  Neurological:     Mental Status: He is alert and oriented to person, place, and time.     Data Reviewed: I have personally reviewed following labs and imaging studies  CBC: Recent Labs  Lab 05/09/24 0455 05/10/24 0429 05/11/24 0455 05/12/24 0704 05/13/24 0219  WBC 16.2* 17.4* 15.9* 13.7* 15.8*  HGB 12.9* 12.0* 11.6* 11.9* 11.6*  HCT 39.8 37.4* 36.2* 36.8* 36.4*  MCV 90.9 90.3 91.2 89.5 90.3  PLT 176 176 189 190 193   Basic Metabolic Panel: Recent Labs  Lab 05/09/24 0455 05/10/24 0429 05/11/24 0455 05/12/24 0704 05/13/24 0219  NA 137 135 136 136 133*  K 3.8 3.8 3.9 3.8 4.5  CL 100 100 98 97* 100  CO2 23 25 24 26 25   GLUCOSE 110* 113* 108* 108* 101*  BUN 28* 25* 34* 30* 28*  CREATININE 1.11 1.07 1.38* 1.22 1.39*  CALCIUM 9.3 9.1 9.1 9.0 9.2  MG  --  1.8 1.9 2.1 2.1   GFR: Estimated Creatinine Clearance: 66.9 mL/min (A) (by C-G formula based on SCr of 1.39 mg/dL (H)). Liver Function Tests: Recent Labs  Lab 05/13/24 0219  AST 22  ALT 18  ALKPHOS 71  BILITOT 0.3  PROT PENDING  ALBUMIN  2.4*   Coagulation Profile: Recent Labs  Lab 05/09/24 0455 05/10/24 1250 05/11/24 0455 05/12/24 0704 05/13/24 0219  INR 1.6* 2.7* 2.4* 2.4* 2.6*   BNP (last 3 results) Recent Labs    12/27/23 0408  BNP 71.4   CBG: Recent Labs  Lab 05/12/24 1128 05/12/24 1619 05/12/24 2139  05/13/24 0624 05/13/24 1123  GLUCAP 129* 119* 118* 94 123*   Recent Results (from the past 240 hours)  MRSA Next Gen by PCR, Nasal     Status: None   Collection Time: 05/09/24  9:35 AM   Specimen: Nasal Mucosa; Nasal Swab  Result Value Ref Range Status   MRSA by PCR Next Gen NOT DETECTED NOT DETECTED Final    Comment: (NOTE) The GeneXpert MRSA Assay (FDA approved for NASAL specimens only), is one component of a comprehensive MRSA colonization surveillance program. It is not intended to diagnose MRSA infection nor to guide or monitor treatment for MRSA infections. Test performance is not FDA approved in patients less than 78 years old. Performed at [facility], [address] [place], [place] [address]     Scheduled Meds:  binimetinib   45 mg Oral BID   budesonide -glycopyrrolate -formoterol   2 puff Inhalation BID   Chlorhexidine  Gluconate Cloth  6 each Topical Daily   Chlorhexidine  Gluconate Cloth  6 each Topical Daily   citalopram   20 mg Oral q AM   dapagliflozin propanediol  10 mg Oral Daily   encorafenib   450 mg Oral QHS   feeding supplement  237 mL Oral BID BM   insulin  aspart  0-9 Units Subcutaneous TID WC   metoprolol  succinate  50 mg Oral Daily   sodium chloride  flush  10-40 mL Intracatheter Q12H   tamsulosin   0.4 mg Oral Daily   warfarin  4 mg Oral ONCE-1600   Warfarin - Pharmacist Dosing Inpatient   Does not apply q1600   Continuous Infusions:  amiodarone  60 mg/hr (05/13/24 0721)     LOS: 4 days   Time spent: 55 minutes  Camellia Door, DO  [facility]  05/13/2024, 12:19 PM

## 2024-05-14 ENCOUNTER — Other Ambulatory Visit: Payer: Self-pay | Admitting: Oncology

## 2024-05-14 ENCOUNTER — Other Ambulatory Visit: Payer: Self-pay

## 2024-05-14 ENCOUNTER — Other Ambulatory Visit (HOSPITAL_COMMUNITY): Payer: Self-pay

## 2024-05-14 DIAGNOSIS — J189 Pneumonia, unspecified organism: Secondary | ICD-10-CM | POA: Insufficient documentation

## 2024-05-14 DIAGNOSIS — C3490 Malignant neoplasm of unspecified part of unspecified bronchus or lung: Secondary | ICD-10-CM

## 2024-05-14 DIAGNOSIS — J449 Chronic obstructive pulmonary disease, unspecified: Secondary | ICD-10-CM | POA: Diagnosis not present

## 2024-05-14 DIAGNOSIS — I2489 Other forms of acute ischemic heart disease: Secondary | ICD-10-CM | POA: Diagnosis not present

## 2024-05-14 DIAGNOSIS — C7951 Secondary malignant neoplasm of bone: Secondary | ICD-10-CM

## 2024-05-14 DIAGNOSIS — I4891 Unspecified atrial fibrillation: Secondary | ICD-10-CM | POA: Diagnosis not present

## 2024-05-14 DIAGNOSIS — Z7901 Long term (current) use of anticoagulants: Secondary | ICD-10-CM | POA: Diagnosis not present

## 2024-05-14 LAB — MAGNESIUM: Magnesium: 1.8 mg/dL (ref 1.7–2.4)

## 2024-05-14 LAB — BRAIN NATRIURETIC PEPTIDE: B Natriuretic Peptide: 39.6 pg/mL (ref 0.0–100.0)

## 2024-05-14 LAB — PROTIME-INR
INR: 2.8 — ABNORMAL HIGH (ref 0.8–1.2)
Prothrombin Time: 30.6 s — ABNORMAL HIGH (ref 11.4–15.2)

## 2024-05-14 LAB — CBC WITH DIFFERENTIAL/PLATELET
Abs Immature Granulocytes: 0.44 K/uL — ABNORMAL HIGH (ref 0.00–0.07)
Basophils Absolute: 0.1 K/uL (ref 0.0–0.1)
Basophils Relative: 1 %
Eosinophils Absolute: 1.3 K/uL — ABNORMAL HIGH (ref 0.0–0.5)
Eosinophils Relative: 8 %
HCT: 35.5 % — ABNORMAL LOW (ref 39.0–52.0)
Hemoglobin: 11.6 g/dL — ABNORMAL LOW (ref 13.0–17.0)
Immature Granulocytes: 3 %
Lymphocytes Relative: 6 %
Lymphs Abs: 1 K/uL (ref 0.7–4.0)
MCH: 29.3 pg (ref 26.0–34.0)
MCHC: 32.7 g/dL (ref 30.0–36.0)
MCV: 89.6 fL (ref 80.0–100.0)
Monocytes Absolute: 1.2 K/uL — ABNORMAL HIGH (ref 0.1–1.0)
Monocytes Relative: 7 %
Neutro Abs: 11.9 K/uL — ABNORMAL HIGH (ref 1.7–7.7)
Neutrophils Relative %: 75 %
Platelets: 193 K/uL (ref 150–400)
RBC: 3.96 MIL/uL — ABNORMAL LOW (ref 4.22–5.81)
RDW: 13.1 % (ref 11.5–15.5)
WBC: 15.8 K/uL — ABNORMAL HIGH (ref 4.0–10.5)
nRBC: 0 % (ref 0.0–0.2)

## 2024-05-14 LAB — BASIC METABOLIC PANEL WITH GFR
Anion gap: 9 (ref 5–15)
BUN: 26 mg/dL — ABNORMAL HIGH (ref 8–23)
CO2: 25 mmol/L (ref 22–32)
Calcium: 8.2 mg/dL — ABNORMAL LOW (ref 8.9–10.3)
Chloride: 102 mmol/L (ref 98–111)
Creatinine, Ser: 1.21 mg/dL (ref 0.61–1.24)
GFR, Estimated: >60 mL/min (ref >60)
Glucose, Bld: 91 mg/dL (ref 70–99)
Potassium: 4 mmol/L (ref 3.5–5.1)
Sodium: 136 mmol/L (ref 135–145)

## 2024-05-14 LAB — GLUCOSE, CAPILLARY
Glucose-Capillary: 91 mg/dL (ref 70–99)
Glucose-Capillary: 109 mg/dL — ABNORMAL HIGH (ref 70–99)

## 2024-05-14 LAB — PROCALCITONIN: Procalcitonin: <0.1 ng/mL

## 2024-05-14 MED ORDER — METOPROLOL SUCCINATE ER 100 MG PO TB24
100.0000 mg | ORAL_TABLET | Freq: Every day | ORAL | Status: DC
Start: 2024-05-15 — End: 2024-05-14

## 2024-05-14 MED ORDER — CEFADROXIL 500 MG PO CAPS
500.0000 mg | ORAL_CAPSULE | Freq: Two times a day (BID) | ORAL | 0 refills | Status: AC
  Filled 2024-05-14: qty 12, 6d supply, fill #0

## 2024-05-14 MED ORDER — WARFARIN SODIUM 10 MG PO TABS: ORAL_TABLET | ORAL | Status: DC

## 2024-05-14 MED ORDER — AMIODARONE HCL 200 MG PO TABS
ORAL_TABLET | ORAL | 0 refills | Status: DC
  Filled 2024-05-14: qty 42, 36d supply, fill #0

## 2024-05-14 MED ORDER — WARFARIN SODIUM 5 MG PO TABS: 5.0000 mg | ORAL_TABLET | ORAL | Status: DC

## 2024-05-14 MED ORDER — BRAFTOVI 75 MG PO CAPS
450.0000 mg | ORAL_CAPSULE | Freq: Every day | ORAL | 0 refills | Status: DC
  Filled 2024-05-14 – 2024-05-17 (×2): qty 180, 30d supply, fill #0

## 2024-05-14 MED ORDER — OXYCODONE HCL 10 MG PO TABS
10.0000 mg | ORAL_TABLET | ORAL | 0 refills | Status: DC | PRN
  Filled 2024-05-14: qty 30, 5d supply, fill #0

## 2024-05-14 MED ORDER — MEKTOVI 15 MG PO TABS
45.0000 mg | ORAL_TABLET | Freq: Two times a day (BID) | ORAL | 0 refills | Status: DC
  Filled 2024-05-14 – 2024-05-17 (×2): qty 180, 30d supply, fill #0

## 2024-05-14 MED ORDER — WARFARIN SODIUM 5 MG PO TABS
10.0000 mg | ORAL_TABLET | ORAL | Status: DC
Start: 2024-05-17 — End: 2024-05-14

## 2024-05-14 MED ORDER — METOPROLOL SUCCINATE ER 50 MG PO TB24
50.0000 mg | ORAL_TABLET | Freq: Once | ORAL | Status: AC
  Administered 2024-05-14: 50 mg via ORAL
  Filled 2024-05-14: qty 1

## 2024-05-14 MED ORDER — TAMSULOSIN HCL 0.4 MG PO CAPS
0.4000 mg | ORAL_CAPSULE | Freq: Every day | ORAL | 0 refills | Status: DC
  Filled 2024-05-14: qty 30, 30d supply, fill #0

## 2024-05-14 MED ORDER — HEPARIN SOD (PORK) LOCK FLUSH 100 UNIT/ML IV SOLN
500.0000 [IU] | INTRAVENOUS | Status: AC | PRN
  Administered 2024-05-14: 500 [IU]

## 2024-05-14 MED ORDER — DAPAGLIFLOZIN PROPANEDIOL 10 MG PO TABS
10.0000 mg | ORAL_TABLET | Freq: Every day | ORAL | 0 refills | Status: DC
  Filled 2024-05-14: qty 30, 30d supply, fill #0

## 2024-05-14 MED ORDER — METOPROLOL SUCCINATE ER 100 MG PO TB24
100.0000 mg | ORAL_TABLET | Freq: Every day | ORAL | 0 refills | Status: DC
  Filled 2024-05-14: qty 30, 30d supply, fill #0

## 2024-05-14 NOTE — Discharge Instructions (Signed)
Information on my medicine - Coumadin   (Warfarin)  This medication education was reviewed with me or my healthcare representative as part of my discharge preparation.   Why was Coumadin prescribed for you? Coumadin was prescribed for you because you have a blood clot or a medical condition that can cause an increased risk of forming blood clots. Blood clots can cause serious health problems by blocking the flow of blood to the heart, lung, or brain. Coumadin can prevent harmful blood clots from forming. As a reminder your indication for Coumadin is:  Stroke Prevention because of Atrial Fibrillation  What test will check on my response to Coumadin? While on Coumadin (warfarin) you will need to have an INR test regularly to ensure that your dose is keeping you in the desired range. The INR (international normalized ratio) number is calculated from the result of the laboratory test called prothrombin time (PT).  If an INR APPOINTMENT HAS NOT ALREADY BEEN MADE FOR YOU please schedule an appointment to have this lab work done by your health care provider within 7 days. Your INR goal is a number between:  2 to 3   What  do you need to  know  About  COUMADIN? Take Coumadin (warfarin) exactly as prescribed by your healthcare provider about the same time each day.  DO NOT stop taking without talking to the doctor who prescribed the medication.  Stopping without other blood clot prevention medication to take the place of Coumadin may increase your risk of developing a new clot or stroke.  Get refills before you run out.  What do you do if you miss a dose? If you miss a dose, take it as soon as you remember on the same day then continue your regularly scheduled regimen the next day.  Do not take two doses of Coumadin at the same time.  Important Safety Information A possible side effect of Coumadin (Warfarin) is an increased risk of bleeding. You should call your healthcare provider right away if you  experience any of the following: Bleeding from an injury or your nose that does not stop. Unusual colored urine (red or dark brown) or unusual colored stools (red or black). Unusual bruising for unknown reasons. A serious fall or if you hit your head (even if there is no bleeding).  Some foods or medicines interact with Coumadin (warfarin) and might alter your response to warfarin. To help avoid this: Eat a balanced diet, maintaining a consistent amount of Vitamin K. Notify your provider about major diet changes you plan to make. Avoid alcohol or limit your intake to 1 drink for women and 2 drinks for men per day. (1 drink is 5 oz. wine, 12 oz. beer, or 1.5 oz. liquor.)  Make sure that ANY health care provider who prescribes medication for you knows that you are taking Coumadin (warfarin).  Also make sure the healthcare provider who is monitoring your Coumadin knows when you have started a new medication including herbals and non-prescription products.  Coumadin (Warfarin)  Major Drug Interactions  Increased Warfarin Effect Decreased Warfarin Effect  Alcohol (large quantities) Antibiotics (esp. Septra/Bactrim, Flagyl, Cipro) Amiodarone (Cordarone) Aspirin (ASA) Cimetidine (Tagamet) Megestrol (Megace) NSAIDs (ibuprofen, naproxen, etc.) Piroxicam (Feldene) Propafenone (Rythmol SR) Propranolol (Inderal) Isoniazid (INH) Posaconazole (Noxafil) Barbiturates (Phenobarbital) Carbamazepine (Tegretol) Chlordiazepoxide (Librium) Cholestyramine (Questran) Griseofulvin Oral Contraceptives Rifampin Sucralfate (Carafate) Vitamin K   Coumadin (Warfarin) Major Herbal Interactions  Increased Warfarin Effect Decreased Warfarin Effect  Garlic Ginseng Ginkgo biloba Coenzyme Q10 Green   tea St. John's wort    Coumadin (Warfarin) FOOD Interactions  Eat a consistent number of servings per week of foods HIGH in Vitamin K (1 serving =  cup)  Collards (cooked, or boiled & drained) Kale  (cooked, or boiled & drained) Mustard greens (cooked, or boiled & drained) Parsley *serving size only =  cup Spinach (cooked, or boiled & drained) Swiss chard (cooked, or boiled & drained) Turnip greens (cooked, or boiled & drained)  Eat a consistent number of servings per week of foods MEDIUM-HIGH in Vitamin K (1 serving = 1 cup)  Asparagus (cooked, or boiled & drained) Broccoli (cooked, boiled & drained, or raw & chopped) Brussel sprouts (cooked, or boiled & drained) *serving size only =  cup Lettuce, raw (green leaf, endive, romaine) Spinach, raw Turnip greens, raw & chopped   These websites have more information on Coumadin (warfarin):  www.coumadin.com; www.ahrq.gov/consumer/coumadin.htm;   

## 2024-05-14 NOTE — Progress Notes (Addendum)
 Nurse requested Mobility Specialist to perform oxygen  saturation test with pt which includes removing pt from oxygen  both at rest and while ambulating.  Below are the results from that testing.     Patient Saturations on Room Air at Rest = spO2 92%  Patient Saturations on Room Air while Ambulating = sp02 90% .    Patient Saturations on 0 Liters of oxygen  while Ambulating = sp02 90%  At end of testing pt left in room on 0  Liters of oxygen .   Lauraine Erm Mobility Specialist Please contact via SecureChat or [employer] [phone]

## 2024-05-14 NOTE — Discharge Summary (Signed)
 [facility] Physician Discharge Summary   Patient name: Jose Joseph  Admit date:     05/09/2024  Discharge date: 05/14/2024  Attending Physician: MAREE HARDER [[id number]]  Discharge Physician: Camellia Door   PCP: Sheryle Carwin, MD  Admitted From: Home  Disposition:  Home  Recommendations for Outpatient Follow-up:  Follow up with PCP in 1-2 weeks Follow up with cardiology as scheduled See coumadin  clinic next week for INR check  Home Health:Yes. Home health PT/OT Equipment/Devices: rolling walker    Discharge Condition:Stable CODE STATUS:FULL Diet recommendation: Heart Healthy Fluid Restriction: None  Hospital Summary: CC: chest pain HPI: 68 year old male with a history of persistent atrial fibrillation status post DCCV May 2025, metastatic lung adenocarcinoma, COPD, hypertension, stroke, depression, chronic HFpEF who presented to [facility] emergency department with complaint of ongoing chest pain.  Patient was noted to be in A-fib with RVR with heart rate in 140s, he was started on diltiazem  infusion, he continued to have chest pain, was started on nitroglycerin  infusion, cardiology evaluated the patient, decision was made to transfer patient to [facility] ICU.  Upon arrival patient noted to be in A-fib with RVR with HR in 140's, denies chest pain palpitation, shortness of breath, headache, fever, chills or other complaints  Stated he has been losing weight for last few months due to low appetite, lost 11 pounds in the last 2 months.  Also complained of increased urinary frequency, denies dysuria  Patient reported developing chest pain at 3:30 AM this morning while sitting in recliner, described as dull, 7/10, located midsternal, radiating to left arm.  Notes to be first episode of chest pain.  Associated with diaphoresis, lightheadedness, and dizziness.  CP improved at hospital once treated with morphine  and NTG.  Denied any SOB, orthopnea, palpitations,  nausea/vomiting, edema, syncope.  Patient denies ever feeling atrial fibrillation and still does not.  Reports daily medication compliance.  Activity is limited due to back pain associated with sciatica nerve.   Presented to [facility] ED 05/09/2024 for chest pain. TN 320 > 452,  K3.8, CR 1.1, WBC 16.2, Hgb 12.9, INR 1.6, prothrombin 20 EKG: Afib w/ RVR, HR 123, PVC CXR with no acute findings; C/W atelectasis Treated with Cardizem  drip, Lopressor  IV 2.5 mg x1, ASA 324 mg, morphine  and NTG drip.  ED consulted with cardiology Dr. Shlomo who recommended transfer to [facility].  Significant Events: Admitted 05/09/2024 to CICU by PCCM due to rapid afib and demand cardiac ischemia 05-09-2024 placed on IV amiodaronge 05-10-2024 failed DCCV. Remains in rapid afib 05-08-2021 converted to NSR. Remains on IV amio. Cards gives approval to transfer to progressive care bed. 05-08-2024 pt went back into rapid afib. Continued on IV amio gtts all weekend 05-12-2024 pt converts back to NSR. DCCV canceled.  Admission Labs: WBC 16.2, HgB 12.9, plt 176 Na 137, K 3.8, CO2 of 23, BUN 28, Scr 1.11, glu 110 INR 1.6 A1c 5.2% UA small HgB, negative nitrite, negative LE, WBC 0-5, Rare bacteria Troponin I 320 >> 452  Admission Imaging Studies: CXR No acute findings. 2. Low lung volume with linear atelectasis/scarring in the right mid lung zone and left lung.  Significant Labs:   Significant Imaging Studies:   Antibiotic Therapy: Anti-infectives (From admission, onward)    None       Procedures:   Consultants: PCCM Cardiology EP/Cards   Hospital Course by Problem: * Demand ischemia (HCC) 05/10/24 cardiology has deemed pt's elevated troponin due to demand ischemia. NOT NSTEMI/ACS.  Atrial fibrillation with rapid ventricular response (HCC) 05/10/24 Went for DCCV today. Unsuccessful. Pt still in rapid afib. Remains on IV heparin  and IV amiodarone  gtts. Cards plans on continue IV amiodarone  through the  weekend.  05/11/24 converted to NSR. Continue with po coumadin .  05/12/24 flipped back into rapid afib. Remains on IV amio gtts and po coumadin . EP/Cards to decide on timing of repeat trial of DCCV. K 3.8 and Mg 2.1. will give more Kcl both IV and PO.  05/13/24 Converted back to NSR last night. His DCCV was canceled. Remains on IV amiodarone  this AM when he was seen. Awaiting for him to be changed to po amiodarone  by cardiology.  05/14/24 home with amiodarone  taper 200 mg bid x 5 days then 200 mg daily. Toprol -XL 100 mg daily. Continue coumadin  10 mg qM, F. Then 5 mg on q Tu, We, Th, Sat, Sun. F/u with INR clinic.   Long term current use of anticoagulant 05/10/24 on coumadin  at home. Was sub-therapeutic on admission with INR 1.6. on IV heparin  currently.  05/11/24 on po coumadin . INR 2.4. IV heparin  off since yesterday evening.  05/12/24 INR 2.4 today. Remains on coumadin   05/13/24 INR 2.6 today. On coumadin .  05/14/24 Continue coumadin  10 mg qM, F. Then 5 mg on q Tu, We, Th, Sat, Sun. F/u with INR clinic.   Malignant neoplasm of lung (HCC) 05/10/24 chronic. Followed by [facility].  05/12/24 stable  05/13/24 per his CT abd/pelvis in 02-2024 he has bony mets to his sacrum. He needs to f/u with his oncologist  05/14/24 f/u with oncology. Prn oxycodone  for metastatic bone pain. Further refills by oncology  Chronic obstructive pulmonary disease (HCC) 05/10/24 currently not exacerbated. Continue with inhalers  05/11/24 stable. On inhalers.  05/12/24 stable. Not exacerbated  05/13/24 stable. Not exacerbated  05/14/24 stable.    LLL pneumonia 05/14/24 has LLL infiltrate on CXR. Cards does not think this is acute CHF. Procalcitonin is negative. WBC stable at 15K. No fevers. Will treat with total of 7 days of po abx.   Obesity, Class I, BMI 30-34.9 Body mass index is 34.64 kg/m.   Lung cancer metastatic to bone (HCC) 05/10/24 followed by oncology.  05/11/24 stable.  F/u with oncology  05/12/24 stable  05/13/24 per his CT abd/pelvis in 02-2024 he has bony mets to his sacrum. He needs to f/u with his oncologist   05/14/24 prn oxycodone  10 mg for home. Future Rx for pain by his oncologist.     Discharge Diagnoses:  Principal Problem:   Demand ischemia [facility]) Active Problems:   Atrial fibrillation with rapid ventricular response (HCC)   Chronic obstructive pulmonary disease (HCC)   Malignant neoplasm of lung (HCC)   Long term current use of anticoagulant   Lung cancer metastatic to bone (HCC)   Obesity, Class I, BMI 30-34.9   Acute on chronic heart failure with preserved ejection fraction (HCC)   LLL pneumonia   Discharge Instructions  Discharge Instructions     (HEART FAILURE PATIENTS) Call MD:  Anytime you have any of the following symptoms: 1) 3 pound weight gain in 24 hours or 5 pounds in 1 week 2) shortness of breath, with or without a dry hacking cough 3) swelling in the hands, feet or stomach 4) if you have to sleep on extra pillows at night in order to breathe.   Complete by: As directed    Call MD for:  difficulty breathing, headache or visual disturbances   Complete by: As  directed    Call MD for:  extreme fatigue   Complete by: As directed    Call MD for:  hives   Complete by: As directed    Call MD for:  persistant dizziness or light-headedness   Complete by: As directed    Call MD for:  persistant nausea and vomiting   Complete by: As directed    Call MD for:  redness, tenderness, or signs of infection (pain, swelling, redness, odor or green/yellow discharge around incision site)   Complete by: As directed    Call MD for:  severe uncontrolled pain   Complete by: As directed    Call MD for:  temperature >100.4   Complete by: As directed    Diet - low sodium heart healthy   Complete by: As directed    Discharge instructions   Complete by: As directed    1. Follow up with your primary care provider in 1-2 weeks following  discharge from hospital. 2. Follow up with cardiology as scheduled 3. Follow up with coumadin  clinic next week for INR. Your coumadin  dosing has changed. Until you are seen in coumadin  clinic and your dosing is changed, You will be on 10 mg coumadin  on Mondays and Fridays.  5 mg on Tues, Wed, Thurs, Sat and Sunday.   Increase activity slowly   Complete by: As directed       Allergies as of 05/14/2024       Reactions   Bee Venom Anaphylaxis   syncope        Medication List     STOP taking these medications    metoprolol  tartrate 50 MG tablet Commonly known as: LOPRESSOR        TAKE these medications    amiodarone  200 MG tablet Commonly known as: PACERONE  Take 1 tablet (200 mg total) by mouth 2 (two) times daily for 6 days, THEN 1 tablet (200 mg total) daily. Start taking on: May 14, 2024   Braftovi  75 MG capsule Generic drug: encorafenib  Take 6 capsules (450 mg total) by mouth daily. What changed: when to take this   Breztri  Aerosphere 160-9-4.8 MCG/ACT Aero inhaler Generic drug: budesonide -glycopyrrolate -formoterol  Inhale 2 puffs into the lungs in the morning and at bedtime.   cefadroxil  500 MG capsule Commonly known as: DURICEF Take 1 capsule (500 mg total) by mouth 2 (two) times daily for 6 days.   cetirizine 10 MG tablet Commonly known as: ZYRTEC Take 10 mg by mouth in the morning.   citalopram  20 MG tablet Commonly known as: CELEXA  Take 20 mg by mouth in the morning.   dapagliflozin propanediol 10 MG Tabs tablet Commonly known as: FARXIGA Take 1 tablet (10 mg total) by mouth daily. Start taking on: May 15, 2024   EPINEPHrine  0.3 mg/0.3 mL Soaj injection Commonly known as: EPI-PEN Inject 0.3 mg into the muscle as needed for anaphylaxis.   Mektovi  15 MG tablet Generic drug: binimetinib  Take 3 tablets (45 mg total) by mouth 2 (two) times daily.   metoprolol  succinate 100 MG 24 hr tablet Commonly known as: TOPROL -XL Take 1 tablet (100 mg  total) by mouth daily. Take with or immediately following a meal. Start taking on: May 15, 2024   Oxycodone  HCl 10 MG Tabs Take 1 tablet (10 mg total) by mouth every 4 (four) hours as needed for severe pain (pain score 7-10).   prochlorperazine  10 MG tablet Commonly known as: COMPAZINE  TAKE 1 TABLET(10 MG) BY MOUTH EVERY 6 HOURS AS NEEDED  tamsulosin  0.4 MG Caps capsule Commonly known as: FLOMAX  Take 1 capsule (0.4 mg total) by mouth daily. Start taking on: May 15, 2024   Ventolin  HFA 108 (90 Base) MCG/ACT inhaler Generic drug: albuterol  Inhale 1 puff into the lungs every 4 (four) hours as needed for wheezing.   warfarin 10 MG tablet Commonly known as: COUMADIN  Take as directed. If you are unsure how to take this medication, talk to your nurse or doctor. Original instructions: Or as directed by coumadin  clinic.  Take 10 mg M, F and 5 mg all other days What changed: additional instructions               Durable Medical Equipment  (From admission, onward)           Start     Ordered   05/10/24 0724  For home use only DME 4 wheeled rolling walker with seat  Once       Question:  Patient needs a walker to treat with the following condition  Answer:  Debility   05/10/24 0723   05/09/24 1615  For home use only DME 4 wheeled rolling walker with seat  Once       Question Answer Comment  Patient needs a walker to treat with the following condition Heart failure [facility])   Patient needs a walker to treat with the following condition Paroxysmal A-fib (HCC)      05/09/24 1616            Follow-up Information     [facility] Follow up.   Specialty: Home Health Services Why: Agency will call you to set up apt times Contact information: [address] [place] [address] [phone]                Allergies  Allergen Reactions   Bee Venom Anaphylaxis    syncope    Discharge Exam: Vitals:   05/14/24 0840 05/14/24 1116  BP:  100/67 115/73  Pulse: 99 99  Resp: 18 20  Temp:  98.4 F (36.9 C)  SpO2: 92% (!) 89%    Physical Exam Vitals and nursing note reviewed.  Constitutional:      General: He is not in acute distress.    Appearance: He is not toxic-appearing.  Eyes:     General: No scleral icterus. Cardiovascular:     Rate and Rhythm: Normal rate and regular rhythm.  Pulmonary:     Effort: Pulmonary effort is normal.  Abdominal:     General: Abdomen is flat. Bowel sounds are normal.     Palpations: Abdomen is soft.  Skin:    General: Skin is warm and dry.     Capillary Refill: Capillary refill takes less than 2 seconds.  Neurological:     Mental Status: He is alert and oriented to person, place, and time.     The results of significant diagnostics from this hospitalization (including imaging, microbiology, ancillary and laboratory) are listed below for reference.    Microbiology: Recent Results (from the past 240 hours)  MRSA Next Gen by PCR, Nasal     Status: None   Collection Time: 05/09/24  9:35 AM   Specimen: Nasal Mucosa; Nasal Swab  Result Value Ref Range Status   MRSA by PCR Next Gen NOT DETECTED NOT DETECTED Final    Comment: (NOTE) The GeneXpert MRSA Assay (FDA approved for NASAL specimens only), is one component of a comprehensive MRSA colonization surveillance program. It is not intended  to diagnose MRSA infection nor to guide or monitor treatment for MRSA infections. Test performance is not FDA approved in patients less than 81 years old. Performed at [facility], [address] [place], [place] [address]      Labs: BNP (last 3 results) Recent Labs    12/27/23 0408 05/14/24 0500  BNP 71.4 39.6   Basic Metabolic Panel: Recent Labs  Lab 05/10/24 0429 05/11/24 0455 05/12/24 0704 05/13/24 0219 05/14/24 0500  NA 135 136 136 133* 136  K 3.8 3.9 3.8 4.5 4.0  CL 100 98 97* 100 102  CO2 25 24 26 25 25   GLUCOSE 113* 108* 108* 101* 91  BUN 25* 34* 30* 28* 26*   CREATININE 1.07 1.38* 1.22 1.39* 1.21  CALCIUM 9.1 9.1 9.0 9.2 8.2*  MG 1.8 1.9 2.1 2.1 1.8   Liver Function Tests: Recent Labs  Lab 05/13/24 0219  AST 22  ALT 18  ALKPHOS 71  BILITOT 0.3  PROT 6.1*  ALBUMIN  2.4*   No results for input(s): LIPASE, AMYLASE in the last 168 hours. No results for input(s): AMMONIA in the last 168 hours. CBC: Recent Labs  Lab 05/10/24 0429 05/11/24 0455 05/12/24 0704 05/13/24 0219 05/14/24 0500  WBC 17.4* 15.9* 13.7* 15.8* 15.8*  NEUTROABS  --   --   --   --  11.9*  HGB 12.0* 11.6* 11.9* 11.6* 11.6*  HCT 37.4* 36.2* 36.8* 36.4* 35.5*  MCV 90.3 91.2 89.5 90.3 89.6  PLT 176 189 190 193 193   Cardiac Enzymes: Recent Labs  Lab 05/09/24 0455 05/09/24 0642  TROPONINIHS 320* 452*   BNP: Recent Labs  Lab 05/14/24 0500  BNP 39.6   CBG: Recent Labs  Lab 05/13/24 1123 05/13/24 1626 05/13/24 2105 05/14/24 0622 05/14/24 1115  GLUCAP 123* 106* 108* 91 109*   Urinalysis    Component Value Date/Time   COLORURINE YELLOW 05/09/2024 1202   APPEARANCEUR CLEAR 05/09/2024 1202   APPEARANCEUR Clear 04/10/2020 1334   LABSPEC 1.019 05/09/2024 1202   PHURINE 5.0 05/09/2024 1202   GLUCOSEU NEGATIVE 05/09/2024 1202   HGBUR SMALL (A) 05/09/2024 1202   BILIRUBINUR NEGATIVE 05/09/2024 1202   BILIRUBINUR Negative 04/10/2020 1334   KETONESUR NEGATIVE 05/09/2024 1202   PROTEINUR NEGATIVE 05/09/2024 1202   NITRITE NEGATIVE 05/09/2024 1202   LEUKOCYTESUR NEGATIVE 05/09/2024 1202   Sepsis Labs Recent Labs  Lab 05/11/24 0455 05/12/24 0704 05/13/24 0219 05/14/24 0500  PROCALCITON  --   --   --  <0.10  WBC 15.9* 13.7* 15.8* 15.8*    Procedures/Studies: DG CHEST PORT 1 VIEW Result Date: 05/13/2024 CLINICAL DATA:  Respiratory failure. EXAM: PORTABLE CHEST 1 VIEW COMPARISON:  Chest CT dated 05/09/2024. FINDINGS: Right-sided Port-A-Cath in similar position. Interval progression of left perihilar densities may represent developing  infiltrate. Clinical correlation and follow-up to resolution recommended. Right mid lung field linear atelectasis/scarring. No pleural effusion or pneumothorax. Stable cardiac silhouette no acute osseous pathology. IMPRESSION: Interval progression of left perihilar densities may represent developing infiltrate. Electronically Signed   By: Vanetta Chou M.D.   On: 05/13/2024 14:16   ECHO TEE Result Date: 05/10/2024    TRANSESOPHOGEAL ECHO REPORT   Patient Name:   PRANSHU LYSTER Date of Exam: 05/10/2024 Medical Rec #:  [id number]     Height:       73.0 in Accession #:    [id number]    Weight:       262.6 lb Date of Birth:  February 04, 1956  BSA:          2.416 m Patient Age:    68 years      BP:           85/52 mmHg Patient Gender: M             HR:           139 bpm. Exam Location:  Inpatient Procedure: Transesophageal Echo, 3D Echo, Color Doppler and Cardiac Doppler            (Both Spectral and Color Flow Doppler were utilized during            procedure). Indications:     I48.91* Unspecified atrial fibrillation  History:         Patient has prior history of Echocardiogram examinations, most                  recent 05/01/2024. COPD, Arrythmias:Atrial Fibrillation; Risk                  Factors:Hypertension and Lung Cancer.  Sonographer:     Damien Senior RDCS Referring Phys:  [id number] LONNI LITTIE NANAS Diagnosing Phys: Ezra Kanner PROCEDURE: After discussion of the risks and benefits of a TEE, an informed consent was obtained from the patient. The transesophogeal probe was passed without difficulty through the esophogus of the patient. Imaged were obtained with the patient in a left lateral decubitus position. Sedation performed by different physician. The patient was monitored while under deep sedation. Anesthestetic sedation was provided intravenously by Anesthesiology: 203mg  of Propofol , 50mg  of Lidocaine . The patient developed no complications during the procedure. An unsuccessful direct current  cardioversion was performed at 360 joules with 3 attempts.  IMPRESSIONS  1. Left ventricular ejection fraction, by estimation, is 55 to 60%. The left ventricle has normal function. The left ventricle has no regional wall motion abnormalities.  2. Right ventricular systolic function is normal. The right ventricular size is normal. Tricuspid regurgitation signal is inadequate for assessing PA pressure.  3. Left atrial size was mildly dilated. No left atrial/left atrial appendage thrombus was detected.  4. The mitral valve is normal in structure. Trivial mitral valve regurgitation. No evidence of mitral stenosis.  5. The aortic valve is tricuspid. Aortic valve regurgitation is trivial. No aortic stenosis is present.  6. No PFO or ASD by color doppler. FINDINGS  Left Ventricle: Left ventricular ejection fraction, by estimation, is 55 to 60%. The left ventricle has normal function. The left ventricle has no regional wall motion abnormalities. The left ventricular internal cavity size was normal in size. There is  no left ventricular hypertrophy. Right Ventricle: The right ventricular size is normal. No increase in right ventricular wall thickness. Right ventricular systolic function is normal. Tricuspid regurgitation signal is inadequate for assessing PA pressure. Left Atrium: Left atrial size was mildly dilated. No left atrial/left atrial appendage thrombus was detected. Right Atrium: Right atrial size was normal in size. Pericardium: There is no evidence of pericardial effusion. Mitral Valve: The mitral valve is normal in structure. Trivial mitral valve regurgitation. No evidence of mitral valve stenosis. Tricuspid Valve: The tricuspid valve is normal in structure. Tricuspid valve regurgitation is trivial. Aortic Valve: The aortic valve is tricuspid. Aortic valve regurgitation is trivial. No aortic stenosis is present. Pulmonic Valve: The pulmonic valve was grossly normal. Pulmonic valve regurgitation is not  visualized. Aorta: The aortic root and ascending aorta are structurally normal, with no evidence of dilitation. IAS/Shunts: No PFO or ASD  by color doppler. Additional Comments: Spectral Doppler performed. [employer] Electronically signed by Ezra Kanner Signature Date/Time: 05/10/2024/6:13:20 PM    Final    EP STUDY Result Date: 05/10/2024 See surgical note for result.  DG Chest Port 1 View Result Date: 05/09/2024 EXAM: 1 VIEW(S) XRAY OF THE CHEST 05/09/2024 05:02:00 AM COMPARISON: 05/01/2024 CLINICAL HISTORY: Tachycardia, chest pain radiates down LT arm region, profuse sweating, severe pain from 4 up to 9, slight dizziness FINDINGS: LINES, TUBES AND DEVICES: Right Port-A-Cath in place with tip overlying midportion of superior vena cava, unchanged. LUNGS AND PLEURA: Low lung volume. Redemonstration of linear atelectasis/scarring overlying right mid lung zone. Additional linear atelectasis/scarring in left lung. No pulmonary edema. No pleural effusion. No pneumothorax. HEART AND MEDIASTINUM: No acute abnormality of the cardiac and mediastinal silhouettes. BONES AND SOFT TISSUES: No acute osseous abnormality. IMPRESSION: 1. No acute findings. 2. Low lung volume with linear atelectasis/scarring in the right mid lung zone and left lung. Electronically signed by: Evalene Coho MD 05/09/2024 05:34 AM EDT RP Workstation: [id number]   ECHOCARDIOGRAM COMPLETE Result Date: 05/01/2024    ECHOCARDIOGRAM REPORT   Patient Name:   JEX STRAUSBAUGH Date of Exam: 05/01/2024 Medical Rec #:  [id number]     Height:       73.0 in Accession #:    [id number]    Weight:       262.0 lb Date of Birth:  1956-05-18     BSA:          2.413 m Patient Age:    68 years      BP:           98/64 mmHg Patient Gender: M             HR:           92 bpm. Exam Location:  [facility] Procedure: 2D Echo, Cardiac Doppler, Color Doppler and Intracardiac            Opacification Agent (Both Spectral and Color Flow Doppler were             utilized during procedure). STAT ECHO Indications:    Atrial Fibrillation l48.91  History:        Patient has prior history of Echocardiogram examinations, most                 recent 04/11/2024. Stroke, Arrythmias:Atrial Fibrillation; Risk                 Factors:Former Smoker and Hypertension. Lung cancer metastatic                 to bone [facility]),                 Chronic obstructive pulmonary disease (HCC).  Sonographer:    Aida Pizza RCS Referring Phys: [phone] TRACI R TURNER IMPRESSIONS  1. Left ventricular ejection fraction, by estimation, is 55 to 60%. The left ventricle has normal function. The left ventricle has no regional wall motion abnormalities. Left ventricular diastolic parameters are consistent with Grade I diastolic dysfunction (impaired relaxation).  2. Right ventricular systolic function is normal. The right ventricular size is normal.  3. The mitral valve is normal in structure. No evidence of mitral valve regurgitation. No evidence of mitral stenosis.  4. The aortic valve is tricuspid. There is mild calcification of the aortic valve. There is mild thickening of the aortic valve. Aortic valve regurgitation is not visualized. Aortic valve sclerosis/calcification is present, without any evidence  of aortic stenosis. FINDINGS  Left Ventricle: Left ventricular ejection fraction, by estimation, is 55 to 60%. The left ventricle has normal function. The left ventricle has no regional wall motion abnormalities. The left ventricular internal cavity size was normal in size. There is  no left ventricular hypertrophy. Left ventricular diastolic parameters are consistent with Grade I diastolic dysfunction (impaired relaxation). Normal left ventricular filling pressure. Right Ventricle: The right ventricular size is normal. No increase in right ventricular wall thickness. Right ventricular systolic function is normal. Left Atrium: Left atrial size was normal in size. Right Atrium: Right atrial size was normal in  size. Pericardium: There is no evidence of pericardial effusion. Mitral Valve: The mitral valve is normal in structure. No evidence of mitral valve regurgitation. No evidence of mitral valve stenosis. Tricuspid Valve: The tricuspid valve is normal in structure. Tricuspid valve regurgitation is not demonstrated. No evidence of tricuspid stenosis. Aortic Valve: The aortic valve is tricuspid. There is mild calcification of the aortic valve. There is mild thickening of the aortic valve. Aortic valve regurgitation is not visualized. Aortic valve sclerosis/calcification is present, without any evidence of aortic stenosis. Pulmonic Valve: The pulmonic valve was normal in structure. Pulmonic valve regurgitation is not visualized. No evidence of pulmonic stenosis. Aorta: The aortic root is normal in size and structure. IAS/Shunts: The interatrial septum appears to be lipomatous. No atrial level shunt detected by color flow Doppler.  LEFT VENTRICLE PLAX 2D LVIDd:         5.10 cm   Diastology LVIDs:         3.50 cm   LV e' medial:    7.96 cm/s LV PW:         1.00 cm   LV E/e' medial:  5.8 LV IVS:        0.90 cm   LV e' lateral:   11.30 cm/s LVOT diam:     2.10 cm   LV E/e' lateral: 4.1 LV SV:         59 LV SV Index:   24 LVOT Area:     3.46 cm  RIGHT VENTRICLE RV S prime:     13.40 cm/s TAPSE (M-mode): 2.3 cm LEFT ATRIUM             Index        RIGHT ATRIUM           Index LA diam:        3.70 cm 1.53 cm/m   RA Area:     16.00 cm LA Vol (A2C):   71.3 ml 29.54 ml/m  RA Volume:   41.60 ml  17.24 ml/m LA Vol (A4C):   51.5 ml 21.34 ml/m LA Biplane Vol: 61.1 ml 25.32 ml/m  AORTIC VALVE LVOT Vmax:   97.33 cm/s LVOT Vmean:  63.433 cm/s LVOT VTI:    0.169 m  AORTA Ao Root diam: 3.70 cm MITRAL VALVE MV Area (PHT): 4.49 cm    SHUNTS MV Decel Time: 169 msec    Systemic VTI:  0.17 m MV E velocity: 46.30 cm/s  Systemic Diam: 2.10 cm MV A velocity: 66.90 cm/s MV E/A ratio:  0.69 Wilbert Bihari MD Electronically signed by Wilbert Bihari  MD Signature Date/Time: 05/01/2024/2:07:26 PM    Final    DG Chest Portable 1 View Result Date: 05/01/2024 CLINICAL DATA:  Sepsis. EXAM: PORTABLE CHEST 1 VIEW COMPARISON:  12/28/2023 FINDINGS: Low volume film. Cardiopericardial silhouette is at upper limits of normal for size. Focal opacity in  the parahilar right lung has decreased in the interval with residual streaky linear density in this region, compatible with scarring as indicated on chest CT 03/11/2024. Right Port-A-Cath again noted. No acute bony abnormality. Telemetry leads overlie the chest. IMPRESSION: Low volume film without acute cardiopulmonary findings. Electronically Signed   By: Camellia Candle M.D.   On: 05/01/2024 11:26    Time coordinating discharge: 60 mins  SIGNED:  Camellia Door, DO [facility] 05/14/24, 11:53 AM

## 2024-05-14 NOTE — TOC Progression Note (Addendum)
 Transition of Care [facility]) - Progression Note    Patient Details  Name: Jose Joseph MRN: [id number] Date of Birth: Sep 19, 1955  Transition of Care [facility]) CM/SW Contact  Waddell Barnie Rama, RN Phone Number: 05/14/2024, 10:26 AM  Clinical Narrative:    NCM offered choice for [facility] and home oxygen  if needed, he states he has no preference.  NCM made referral to [facility] for HHPT, HHOT,  she is able to take referral.  Soc will begin 24 to 48 hrs post dc.  Looks like [employer] is supplying the rollator per previous NCM handoff.     Expected Discharge Plan: Home/Self Care Barriers to Discharge: Continued Medical Work up               Expected Discharge Plan and Services   Discharge Planning Services: CM Consult Post Acute Care Choice: Durable Medical Equipment Living arrangements for the past 2 months: Single Family Home                                       Social Drivers of Health (SDOH) Interventions SDOH Screenings   Food Insecurity: No Food Insecurity (05/09/2024)  Housing: Low Risk  (05/09/2024)  Transportation Needs: No Transportation Needs (05/09/2024)  Utilities: Not At Risk (05/09/2024)  Depression (PHQ2-9): Low Risk  (05/06/2024)  Social Connections: Moderately Isolated (05/09/2024)  Tobacco Use: Medium Risk (05/09/2024)    Readmission Risk Interventions    05/03/2024   10:26 AM  Readmission Risk Prevention Plan  Transportation Screening Complete  HRI or Home Care Consult Complete  Social Work Consult for Recovery Care Planning/Counseling Complete  Palliative Care Screening Not Applicable  Medication Review [employer]) Complete

## 2024-05-14 NOTE — Progress Notes (Signed)
 PROGRESS NOTE    Jose Joseph  FMW:[id number] DOB: 08/13/1956 DOA: 05/09/2024 PCP: Sheryle Carwin, MD  Subjective: Pt seen and examined. Pt and wife at bedside. Pt given DC clearance by cards. Pt now agreeable to home PT. Remains on NSR. Discussed with cards. Dr. Floretta wants him on Toprol -XL in addition to amiodarone  for his afib/PACs. He did not qualify for home O2.   Hospital Course: CC: chest pain HPI: 68 year old male with a history of persistent atrial fibrillation status post DCCV May 2025, metastatic lung adenocarcinoma, COPD, hypertension, stroke, depression, chronic HFpEF who presented to [place] emergency department with complaint of ongoing chest pain.  Patient was noted to be in A-fib with RVR with heart rate in 140s, he was started on diltiazem  infusion, he continued to have chest pain, was started on nitroglycerin  infusion, cardiology evaluated the patient, decision was made to transfer patient to [facility] ICU.  Upon arrival patient noted to be in A-fib with RVR with HR in 140's, denies chest pain palpitation, shortness of breath, headache, fever, chills or other complaints  Stated he has been losing weight for last few months due to low appetite, lost 11 pounds in the last 2 months.  Also complained of increased urinary frequency, denies dysuria  Patient reported developing chest pain at 3:30 AM this morning while sitting in recliner, described as dull, 7/10, located midsternal, radiating to left arm.  Notes to be first episode of chest pain.  Associated with diaphoresis, lightheadedness, and dizziness.  CP improved at hospital once treated with morphine  and NTG.  Denied any SOB, orthopnea, palpitations, nausea/vomiting, edema, syncope.  Patient denies ever feeling atrial fibrillation and still does not.  Reports daily medication compliance.  Activity is limited due to back pain associated with sciatica nerve.   Presented to [facility] ED 05/09/2024 for chest pain. TN 320 >  452,  K3.8, CR 1.1, WBC 16.2, Hgb 12.9, INR 1.6, prothrombin 20 EKG: Afib w/ RVR, HR 123, PVC CXR with no acute findings; C/W atelectasis Treated with Cardizem  drip, Lopressor  IV 2.5 mg x1, ASA 324 mg, morphine  and NTG drip.  ED consulted with cardiology Dr. Shlomo who recommended transfer to [facility].  Significant Events: Admitted 05/09/2024 to CICU by PCCM due to rapid afib and demand cardiac ischemia 05-09-2024 placed on IV amiodaronge 05-10-2024 failed DCCV. Remains in rapid afib 05-08-2021 converted to NSR. Remains on IV amio. Cards gives approval to transfer to progressive care bed.  Admission Labs: WBC 16.2, HgB 12.9, plt 176 Na 137, K 3.8, CO2 of 23, BUN 28, Scr 1.11, glu 110 INR 1.6 A1c 5.2% UA small HgB, negative nitrite, negative LE, WBC 0-5, Rare bacteria Troponin I 320 >> 452  Admission Imaging Studies: CXR No acute findings. 2. Low lung volume with linear atelectasis/scarring in the right mid lung zone and left lung.  Significant Labs:   Significant Imaging Studies:   Antibiotic Therapy: Anti-infectives (From admission, onward)    None       Procedures:   Consultants: PCCM Cardiology EP/Cards    Assessment and Plan: * Demand ischemia (HCC) 05/10/24 cardiology has deemed pt's elevated troponin due to demand ischemia. NOT NSTEMI/ACS.   Atrial fibrillation with rapid ventricular response (HCC) 05/10/24 Went for DCCV today. Unsuccessful. Pt still in rapid afib. Remains on IV heparin  and IV amiodarone  gtts. Cards plans on continue IV amiodarone  through the weekend.  05/11/24 converted to NSR. Continue with po coumadin .  05/12/24 flipped back into rapid afib. Remains on  IV amio gtts and po coumadin . EP/Cards to decide on timing of repeat trial of DCCV. K 3.8 and Mg 2.1. will give more Kcl both IV and PO.  05/13/24 Converted back to NSR last night. His DCCV was canceled. Remains on IV amiodarone  this AM when he was seen. Awaiting for him to be changed  to po amiodarone  by cardiology.  05/14/24 home with amiodarone  taper 200 mg bid x 5 days then 200 mg daily. Toprol -XL 100 mg daily. Continue coumadin  10 mg qM, F. Then 5 mg on q Tu, We, Th, Sat, Sun. F/u with INR clinic.   Long term current use of anticoagulant 05/10/24 on coumadin  at home. Was sub-therapeutic on admission with INR 1.6. on IV heparin  currently.  05/11/24 on po coumadin . INR 2.4. IV heparin  off since yesterday evening.  05/12/24 INR 2.4 today. Remains on coumadin   05/13/24 INR 2.6 today. On coumadin .  05/14/24 Continue coumadin  10 mg qM, F. Then 5 mg on q Tu, We, Th, Sat, Sun. F/u with INR clinic.       Malignant neoplasm of lung (HCC) 05/10/24 chronic. Followed by [facility].  05/12/24 stable  05/13/24 per his CT abd/pelvis in 02-2024 he has bony mets to his sacrum. He needs to f/u with his oncologist  05/14/24 f/u with oncology. Prn oxycodone  for metastatic bone pain. Further refills by oncology  Chronic obstructive pulmonary disease (HCC) 05/10/24 currently not exacerbated. Continue with inhalers  05/11/24 stable. On inhalers.  05/12/24 stable. Not exacerbated  05/13/24 stable. Not exacerbated  05/14/24 stable.    LLL pneumonia 05/14/24 has LLL infiltrate on CXR. Cards does not think this is acute CHF. Procalcitonin is negative. WBC stable at 15K. No fevers. Will treat with total of 7 days of po abx.   Obesity, Class I, BMI 30-34.9 Body mass index is 34.64 kg/m.   Lung cancer metastatic to bone (HCC) 05/10/24 followed by oncology.  05/11/24 stable. F/u with oncology  05/12/24 stable  05/13/24 per his CT abd/pelvis in 02-2024 he has bony mets to his sacrum. He needs to f/u with his oncologist   05/14/24 prn oxycodone  10 mg for home. Future Rx for pain by his oncologist.    DVT prophylaxis:  warfarin (COUMADIN ) tablet 5 mg  warfarin (COUMADIN ) tablet 10 mg     Code Status: Prior Family Communication: discussed with pt and wife at  bedside Disposition Plan: return home with home health Reason for continuing need for hospitalization: stable for DC. Cards has given DC clearance.  Objective: Vitals:   05/14/24 0736 05/14/24 0825 05/14/24 0840 05/14/24 1116  BP:  (!) 88/77 100/67 115/73  Pulse: (!) 107 90 99 99  Resp: 20 20 18 20   Temp:  98.7 F (37.1 C)  98.4 F (36.9 C)  TempSrc:  Oral  Oral  SpO2: 90% 91% 92% (!) 89%  Weight:      Height:        Intake/Output Summary (Last 24 hours) at 05/14/2024 1149 Last data filed at 05/14/2024 0503 Gross per 24 hour  Intake 424.92 ml  Output 500 ml  Net -75.08 ml   Filed Weights   05/09/24 0433 05/10/24 0306 05/11/24 1241  Weight: 120.8 kg 119.1 kg 119.2 kg   Examination:  Physical Exam Vitals and nursing note reviewed.  Constitutional:      General: He is not in acute distress.    Appearance: He is not toxic-appearing.  Eyes:     General: No scleral icterus. Cardiovascular:  Rate and Rhythm: Normal rate and regular rhythm.  Pulmonary:     Effort: Pulmonary effort is normal.  Abdominal:     General: Abdomen is flat. Bowel sounds are normal.     Palpations: Abdomen is soft.  Skin:    General: Skin is warm and dry.     Capillary Refill: Capillary refill takes less than 2 seconds.  Neurological:     Mental Status: He is alert and oriented to person, place, and time.     Data Reviewed: I have personally reviewed following labs and imaging studies  CBC: Recent Labs  Lab 05/10/24 0429 05/11/24 0455 05/12/24 0704 05/13/24 0219 05/14/24 0500  WBC 17.4* 15.9* 13.7* 15.8* 15.8*  NEUTROABS  --   --   --   --  11.9*  HGB 12.0* 11.6* 11.9* 11.6* 11.6*  HCT 37.4* 36.2* 36.8* 36.4* 35.5*  MCV 90.3 91.2 89.5 90.3 89.6  PLT 176 189 190 193 193   Basic Metabolic Panel: Recent Labs  Lab 05/10/24 0429 05/11/24 0455 05/12/24 0704 05/13/24 0219 05/14/24 0500  NA 135 136 136 133* 136  K 3.8 3.9 3.8 4.5 4.0  CL 100 98 97* 100 102  CO2 25 24 26 25 25    GLUCOSE 113* 108* 108* 101* 91  BUN 25* 34* 30* 28* 26*  CREATININE 1.07 1.38* 1.22 1.39* 1.21  CALCIUM 9.1 9.1 9.0 9.2 8.2*  MG 1.8 1.9 2.1 2.1 1.8   GFR: Estimated Creatinine Clearance: 76.8 mL/min (by C-G formula based on SCr of 1.21 mg/dL). Liver Function Tests: Recent Labs  Lab 05/13/24 0219  AST 22  ALT 18  ALKPHOS 71  BILITOT 0.3  PROT 6.1*  ALBUMIN  2.4*   Coagulation Profile: Recent Labs  Lab 05/10/24 1250 05/11/24 0455 05/12/24 0704 05/13/24 0219 05/14/24 0639  INR 2.7* 2.4* 2.4* 2.6* 2.8*   BNP (last 3 results) Recent Labs    12/27/23 0408 05/14/24 0500  BNP 71.4 39.6   CBG: Recent Labs  Lab 05/13/24 1123 05/13/24 1626 05/13/24 2105 05/14/24 0622 05/14/24 1115  GLUCAP 123* 106* 108* 91 109*   Sepsis Labs: Recent Labs  Lab 05/14/24 0500  PROCALCITON <0.10    Recent Results (from the past 240 hours)  MRSA Next Gen by PCR, Nasal     Status: None   Collection Time: 05/09/24  9:35 AM   Specimen: Nasal Mucosa; Nasal Swab  Result Value Ref Range Status   MRSA by PCR Next Gen NOT DETECTED NOT DETECTED Final    Comment: (NOTE) The GeneXpert MRSA Assay (FDA approved for NASAL specimens only), is one component of a comprehensive MRSA colonization surveillance program. It is not intended to diagnose MRSA infection nor to guide or monitor treatment for MRSA infections. Test performance is not FDA approved in patients less than 30 years old. Performed at [facility], [address], [place], [place] [address]      Radiology Studies: DG CHEST PORT 1 VIEW Result Date: 05/13/2024 CLINICAL DATA:  Respiratory failure. EXAM: PORTABLE CHEST 1 VIEW COMPARISON:  Chest CT dated 05/09/2024. FINDINGS: Right-sided Port-A-Cath in similar position. Interval progression of left perihilar densities may represent developing infiltrate. Clinical correlation and follow-up to resolution recommended. Right mid lung field linear atelectasis/scarring. No pleural  effusion or pneumothorax. Stable cardiac silhouette no acute osseous pathology. IMPRESSION: Interval progression of left perihilar densities may represent developing infiltrate. Electronically Signed   By: Vanetta Chou M.D.   On: 05/13/2024 14:16    Scheduled Meds:  amiodarone   200 mg Oral BID   Followed by   NOREEN ON 05/19/2024] amiodarone   200 mg Oral Daily   binimetinib   45 mg Oral BID   budesonide -glycopyrrolate -formoterol   2 puff Inhalation BID   cefadroxil   500 mg Oral BID   Chlorhexidine  Gluconate Cloth  6 each Topical Daily   Chlorhexidine  Gluconate Cloth  6 each Topical Daily   citalopram   20 mg Oral q AM   dapagliflozin propanediol  10 mg Oral Daily   encorafenib   450 mg Oral QHS   feeding supplement  237 mL Oral BID BM   insulin  aspart  0-9 Units Subcutaneous TID WC   [START ON 05/15/2024] metoprolol  succinate  100 mg Oral Daily   metoprolol  succinate  50 mg Oral Once   sodium chloride  flush  10-40 mL Intracatheter Q12H   tamsulosin   0.4 mg Oral Daily   [START ON 05/17/2024] warfarin  10 mg Oral Once per day on Monday Friday   warfarin  5 mg Oral Once per day on Sunday Tuesday Wednesday Thursday Saturday   Warfarin - Pharmacist Dosing Inpatient   Does not apply q1600   Continuous Infusions:   LOS: 5 days   Time spent: 60 minutes  Camellia Door, DO  [facility]  05/14/2024, 11:49 AM

## 2024-05-14 NOTE — Plan of Care (Signed)

## 2024-05-14 NOTE — Progress Notes (Addendum)
  Progress Note  Patient Name: Jose Joseph Date of Encounter: 05/14/2024 [facility] Cardiologist: Diannah SHAUNNA Maywood, MD   Interval Summary   Doing well this morning  Complains of some acute back pain  BP was soft this morning, better on recheck  Denies any chest pain, shortness of breath  Eager to go home  Vital Signs Vitals:   05/14/24 0500 05/14/24 0736 05/14/24 0825 05/14/24 0840  BP: 111/61  (!) 88/77 100/67  Pulse: 87 (!) 107 90 99  Resp: 20 20 20 18   Temp: 98.3 F (36.8 C)  98.7 F (37.1 C)   TempSrc: Oral  Oral   SpO2: 91% 90% 91% 92%  Weight:      Height:        Intake/Output Summary (Last 24 hours) at 05/14/2024 0941 Last data filed at 05/14/2024 0503 Gross per 24 hour  Intake 424.92 ml  Output 500 ml  Net -75.08 ml      05/11/2024   12:41 PM 05/10/2024    3:06 AM 05/09/2024    4:33 AM  Last 3 Weights  Weight (lbs) 262 lb 12.6 oz 262 lb 9.1 oz 266 lb 5.1 oz  Weight (kg) 119.2 kg 119.1 kg 120.8 kg     Telemetry/ECG  Sinus rhythm, HR 90s, PACs - Personally Reviewed  Physical Exam  GEN: No acute distress, remains on 2L oxygen  via [place] Neck: No JVD Cardiac: RRR, no murmurs, rubs, or gallops.  Respiratory: Clear to auscultation bilaterally. GI: Soft, nontender, non-distended  MS: No edema  Assessment & Plan   PAF with RVR Presented with A-Fib RVR Underwent successful DCCV 9/18 Remains in sinus, HR 90-100s, PACs  Continue PO amiodarone  200 mg BID x 5 days ? 200 mg daily Continue Toprol  50 mg daily Continue warfarin per pharmacy   Acute on chronic HFpEF Appears euvolemic on exam  Does not require additional diuresis at this time  Continue to monitor strict I&O's Continue Farixga 10 mg daily  Continue Toprol  50 mg daily  Consider adding MRA when ensure that BP can tolerate   Hypertension BP stable this admission, low normal this AM Rechecked his BP when I was in room, 100/67 Medications as above   Hypoxic respiratory failure   Requiring supplemental oxygen  while inpatient Does not wear chronic O2 at home  History of COPD, SpO2 goal 88-92% Continue with incentive spirometer Remains on oxygen  PT reported he ambulated well without oxygen  SpO2 91-92% Wean as tolerated    For questions or updates, please contact [facility] Please consult [email or web address] for contact info under         Signed, Jose DELENA Donath, PA-C

## 2024-05-14 NOTE — Progress Notes (Signed)
 Physical Therapy Treatment Patient Details Name: Jose Joseph MRN: [id number] DOB: [birth year] Today's Date: 05/14/2024   History of Present Illness 68 year old male who presented 9/25 to [facility] emergency department with complaint of ongoing chest pain. Patient was noted to be in A-fib with RVR. Transferred to [facility]. 09/26 failed DCCV. 9/24 converted to NSR. PMH: atrial fibrillation status post DCCV May 2025, metastatic lung adenocarcinoma, COPD, hypertension, stroke, depression, chronic HFpEF.    PT Comments  Now agreeable to HHPT. Patient was able to navigate steps with set-up similar to home environment and feels confident he can complete at d/c. His ambulatory distance remains limited by lower back pain. Reviewed rollator use. No buckling noted but educated on techniques to maximize support and safety with this device. He is eager to return home. Will follow during acute admission. Patient will continue to benefit from skilled physical therapy services to further improve independence with functional mobility.     If plan is discharge home, recommend the following: A little help with bathing/dressing/bathroom;Assistance with cooking/housework;Assist for transportation;Help with stairs or ramp for entrance;A little help with walking and/or transfers   Can travel by private vehicle        Equipment Recommendations  Rollator (4 wheels)    Recommendations for Other Services       Precautions / Restrictions Precautions Precautions: Fall;Back Precaution Booklet Issued: Yes (comment) Recall of Precautions/Restrictions: Intact Precaution/Restrictions Comments: Monitor HR and BP, has handout on back precs for comfort Restrictions Weight Bearing Restrictions Per Provider Order: No     Mobility  Bed Mobility Overal bed mobility: Needs Assistance Bed Mobility: Rolling, Sidelying to Sit Rolling: Modified independent (Device/Increase time), Used rails Sidelying to sit: Contact  guard assist       General bed mobility comments: Mod I to roll. Cues for tehcnique. CGA to rise with cues for sequencing.    Transfers Overall transfer level: Needs assistance Equipment used: Rollator (4 wheels) Transfers: Sit to/from Stand, Bed to chair/wheelchair/BSC Sit to Stand: Contact guard assist, From elevated surface Stand pivot transfers: Contact guard assist         General transfer comment: CGA for safety, performed from elevated bed surface and rollator x2. Slow to rise but stable with light UE support from Rollator. Cues for set-up, brake engagement. CGA for stand pivot transfer x2, no assist, extra time.    Ambulation/Gait Ambulation/Gait assistance: Contact guard assist Gait Distance (Feet): 12 Feet (+5) Assistive device: Rollator (4 wheels) Gait Pattern/deviations: Step-through pattern, Decreased stride length, Antalgic, Trunk flexed Gait velocity: dec     General Gait Details: Limited by back pain, trunk flexed. cues for safe AD use. pt without overt buckling or stagger. Educated on rollator use to maximize safety. Able to push against wall, engage brakes, and sit down to rest.   Stairs Stairs: Yes Stairs assistance: Contact guard assist Stair Management: One rail Right, Step to pattern, Sideways Number of Stairs: 3 General stair comments: Demonstrated to pt, lateral approach, step to, with bil hands to rail on Rt similar to home set-up. Completed with CGA, no buckling. Feels confident he can complete at home.   Wheelchair Mobility     Tilt Bed    Modified Rankin (Stroke Patients Only)       Balance Overall balance assessment: Needs assistance Sitting-balance support: Feet supported, No upper extremity supported Sitting balance-Leahy Scale: Good     Standing balance support: No upper extremity supported, During functional activity Standing balance-Leahy Scale: Fair Standing balance comment: improved with  UE support.                             Communication Communication Communication: No apparent difficulties  Cognition Arousal: Alert Behavior During Therapy: WFL for tasks assessed/performed   PT - Cognitive impairments: No apparent impairments                       PT - Cognition Comments: pt given handout on back precs (for comfort), IS use and supine HEP with fair carryover of education during session. Following commands: Intact      Cueing Cueing Techniques: Verbal cues  Exercises      General Comments General comments (skin integrity, edema, etc.): SpO2 90-91% on RA throughout session. HR 116 with exertion      Pertinent Vitals/Pain Pain Assessment Faces Pain Scale: Hurts even more Pain Location: back Pain Descriptors / Indicators: Aching, Grimacing, Guarding, Discomfort    Home Living                          Prior Function            PT Goals (current goals can now be found in the care plan section) Acute Rehab PT Goals Patient Stated Goal: Reduce back pain PT Goal Formulation: With patient Time For Goal Achievement: 05/24/24 Potential to Achieve Goals: Good Progress towards PT goals: Progressing toward goals    Frequency    Min 2X/week      PT Plan      Co-evaluation              AM-PAC PT 6 Clicks Mobility   Outcome Measure  Help needed turning from your back to your side while in a flat bed without using bedrails?: None Help needed moving from lying on your back to sitting on the side of a flat bed without using bedrails?: A Little Help needed moving to and from a bed to a chair (including a wheelchair)?: A Little Help needed standing up from a chair using your arms (e.g., wheelchair or bedside chair)?: A Little Help needed to walk in hospital room?: A Little Help needed climbing 3-5 steps with a railing? : A Little 6 Click Score: 19    End of Session   Activity Tolerance: Patient limited by pain Patient left: in bed;with call  bell/phone within reach;with bed alarm set (declines chair.)   PT Visit Diagnosis: Unsteadiness on feet (R26.81);Other abnormalities of gait and mobility (R26.89);Muscle weakness (generalized) (M62.81);Difficulty in walking, not elsewhere classified (R26.2);Pain Pain - part of body:  (back)     Time: 0726-0753 PT Time Calculation (min) (ACUTE ONLY): 27 min  Charges:    $Gait Training: 8-22 mins $Therapeutic Activity: 8-22 mins PT General Charges $$ ACUTE PT VISIT: 1 Visit                     Leontine Roads, PT, DPT [facility]  Rehabilitation Services Physical Therapist Office: [phone] Website: [email or web address]    Leontine GORMAN Roads 05/14/2024, 8:40 AM

## 2024-05-14 NOTE — Progress Notes (Signed)
 PHARMACY - ANTICOAGULATION CONSULT NOTE  Pharmacy Consult for heparin  drip and warfarin Indication: atrial fibrillation  Allergies  Allergen Reactions   Bee Venom Anaphylaxis    syncope    Patient Measurements: Height: 6' 1 (185.4 cm) Weight: 119.2 kg (262 lb 12.6 oz) IBW/kg (Calculated) : 79.9 HEPARIN  DW (KG): 105.7  Vital Signs: Temp: 98.4 F (36.9 C) (09/30 1116) Temp Source: Oral (09/30 1116) BP: 115/73 (09/30 1116) Pulse Rate: 99 (09/30 1116)  Labs: Recent Labs    05/12/24 0704 05/13/24 0219 05/14/24 0500 05/14/24 0639  HGB 11.9* 11.6* 11.6*  --   HCT 36.8* 36.4* 35.5*  --   PLT 190 193 193  --   LABPROT 27.5* 28.8*  --  30.6*  INR 2.4* 2.6*  --  2.8*  CREATININE 1.22 1.39* 1.21  --     Estimated Creatinine Clearance: 76.8 mL/min (by C-G formula based on SCr of 1.21 mg/dL).   Medical History: Past Medical History:  Diagnosis Date   Arthritis    Asthma    Atrial fibrillation (HCC)    Cancer (HCC)    Complication of anesthesia    after his neck surgery pt. 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 Pt had AF DCCV 9/18, also has hx CVA. INR on admit low at 1.6, to heparin  bridge started now off, warfarin resumed.  INR therapeutic at 2.8 - trended up on PTA dose with new amiodarone   Home dose is 10mg  daily except 5mg  Mon/Wed/Fri.  CBC stable  Will dc home on lower dose  Goal of Therapy:  INR 2-3 Monitor platelets by anticoagulation protocol: Yes   Plan:  -Warfarin 10mg  MF and 5mg  all other days  -DC planning  -asked CVRR for  INR check next week -Daily INR  Olam Chalk Pharm.D. CPP, BCPS Clinical Pharmacist [phone]

## 2024-05-14 NOTE — Assessment & Plan Note (Signed)
 05/14/24 has LLL infiltrate on CXR. Cards does not think this is acute CHF. Procalcitonin is negative. WBC stable at 15K. No fevers. Will treat with total of 7 days of po abx.

## 2024-05-15 ENCOUNTER — Telehealth: Payer: Self-pay

## 2024-05-15 NOTE — Transitions of Care (Post Inpatient/ED Visit) (Signed)
 05/15/2024  Name: Jose Joseph MRN: [id number] DOB: [birth year]  Today's TOC FU Call Status: Today's TOC FU Call Status:: Successful TOC FU Call Completed TOC FU Call Complete Date: 05/15/24 Patient's Name and Date of Birth confirmed.  Transition Care Management Follow-up Telephone Call Date of Discharge: 05/14/24 Discharge Facility: [facility]) Type of Discharge: Inpatient Admission Primary Inpatient Discharge Diagnosis:: Demand ischemia How have you been since you were released from the hospital?: Better Any questions or concerns?: No  Items Reviewed: Did you receive and understand the discharge instructions provided?: No Medications obtained,verified, and reconciled?: Yes (Medications Reviewed) Any new allergies since your discharge?: No Dietary orders reviewed?: Yes Type of Diet Ordered:: Low Sodium Heart Healthy Do you have support at home?: Yes People in Home [RPT]: spouse Name of Support/Comfort Primary Source: Jose Joseph  Medications Reviewed Today: Medications Reviewed Today     Reviewed by Jose Trick, RN (Case Manager) on 05/15/24 at 1020  Med List Status: <None>   Medication Order Taking? Sig Documenting Provider Last Dose Status Informant  albuterol  (VENTOLIN  HFA) 108 (90 Base) MCG/ACT inhaler [id number] Yes Inhale 1 puff into the lungs every 4 (four) hours as needed for wheezing. Jose Yetta HERO, MD  Active Self, Pharmacy Records  amiodarone  (PACERONE ) 200 MG tablet [id number] Yes Take 1 tablet (200 mg total) by mouth 2 (two) times daily for 6 days, THEN 1 tablet (200 mg total) daily. Jose Locus, DO  Active   binimetinib  (MEKTOVI ) 15 MG tablet [id number] Yes Take 3 tablets (45 mg total) by mouth 2 (two) times daily. Kandala, Hyndavi, MD  Active   budesonide -glycopyrrolate -formoterol  (BREZTRI  AEROSPHERE) 160-9-4.8 MCG/ACT AERO inhaler [id number] Yes Inhale 2 puffs into the lungs in the morning and at bedtime. Jose Yetta HERO, MD  Active Self, Pharmacy Records   cefadroxil  (DURICEF) 500 MG capsule [id number] Yes Take 1 capsule (500 mg total) by mouth 2 (two) times daily for 6 days. Jose Locus, DO  Active   cetirizine (ZYRTEC) 10 MG tablet [id number] Yes Take 10 mg by mouth in the morning. [provider]  Active Self, Pharmacy Records  citalopram  (CELEXA ) 20 MG tablet [id number] Yes Take 20 mg by mouth in the morning. [provider]  Active Self, Pharmacy Records  dapagliflozin propanediol (FARXIGA) 10 MG TABS tablet [id number] Yes Take 1 tablet (10 mg total) by mouth daily. Jose Locus, DO  Active   encorafenib  (BRAFTOVI ) 75 MG capsule [id number] Yes Take 6 capsules (450 mg total) by mouth daily. Kandala, Hyndavi, MD  Active   EPINEPHrine  0.3 mg/0.3 mL IJ SOAJ injection [id number] Yes Inject 0.3 mg into the muscle as needed for anaphylaxis. [provider]  Active Self, Pharmacy Records  heparin  lock flush 100 unit/mL [id number]   Jose Hai, MD  Active   metoprolol  succinate (TOPROL -XL) 100 MG 24 hr tablet [id number] Yes Take 1 tablet (100 mg total) by mouth daily. Take with or immediately following a meal. Jose Locus, DO  Active   Oxycodone  HCl 10 MG TABS [id number] Yes Take 1 tablet (10 mg total) by mouth every 4 (four) hours as needed for severe pain (pain score 7-10). Jose Locus, DO  Active   prochlorperazine  (COMPAZINE ) 10 MG tablet [id number] Yes TAKE 1 TABLET(10 MG) BY MOUTH EVERY 6 HOURS AS NEEDED Jose Hai, MD  Active Self, Pharmacy Records  tamsulosin  (FLOMAX ) 0.4 MG CAPS capsule [id number] Yes Take 1 capsule (0.4 mg total) by mouth daily. Jose Locus, DO  Active   warfarin (COUMADIN ) 10 MG tablet  [id number] Yes Or as directed by coumadin  clinic.  Take 10 mg M, F and 5 mg all other days Jose Locus, DO  Active   Med List Note Jose Joseph Joseph, CPhT 05/09/24 1703): Mektovi  and Braftovi  filled at [facility] (Specialty)             Home Care and Equipment/Supplies: Were Home Health Services  Ordered?: Yes Name of Home Health Agency:: [facility] Has Agency set up a time to come to your home?: No (Patient d/c 9/30. Patient has [facility] contact information if no contact by Friday.) Any new equipment or medical supplies ordered?: Yes Name of Medical supply agency?: [facility] Were you able to get the equipment/medical supplies?: Yes Do you have any questions related to the use of the equipment/supplies?: No  Functional Questionnaire: Do you need assistance with bathing/showering or dressing?: No Do you need assistance with meal preparation?: No Do you need assistance with eating?: No Do you have difficulty maintaining continence: No Do you need assistance with getting out of bed/getting out of a chair/moving?: No Do you have difficulty managing or taking your medications?: No  Follow up appointments reviewed: PCP Follow-up appointment confirmed?: Yes MD Provider Line Number:[phone] Given: No Date of PCP follow-up appointment?: 05/21/24 Follow-up Provider: Dr. Sheryle Specialist [facility] Follow-up appointment confirmed?: Yes Date of Specialist follow-up appointment?: 05/20/24 Follow-Up Specialty Provider:: Jose Crate Do you need transportation to your follow-up appointment?: No Do you understand care options if your condition(s) worsen?: Yes-patient verbalized understanding  SDOH Interventions Today    Flowsheet Row Most Recent Value  SDOH Interventions   Food Insecurity Interventions Intervention Not Indicated  Housing Interventions Intervention Not Indicated  Transportation Interventions Intervention Not Indicated  Utilities Interventions Intervention Not Indicated    Jose Joseph J. Brendi Mccarroll RN, MSN [facility]  [facility], [facility] RN Care Manager Direct Dial: [phone]  Fax: [phone] Website: [email or web address]

## 2024-05-15 NOTE — Patient Instructions (Signed)
 Visit Information  Thank you for taking time to visit with me today. Please don't hesitate to contact me if I can be of assistance to you before our next scheduled telephone appointment.  Our next appointment is by telephone on 05/22/24 at 100 pm  Following is a copy of your care plan:   Goals Addressed             This Visit's Progress    VBCI Transitions of Care (TOC) Care Plan       Problems:  Recent Hospitalization for treatment of Atrial Fibrillation and CHF Knowledge Deficit Related to Atrial Fibrillation, CHF and pneumonia  Goal:  Over the next 30 days, the patient will not experience hospital readmission  Interventions:  Transitions of Care: 05/15/24  Patient reports he feels better.  He reports that his heart was going fast.  We discussed A. Fibrillation and heart failure. Patient weighs daily.  Most recent weight 258 lbs.  Advised when to notify physician and when to return to the hospital.  Wife is  the home to support.  Follow up appointments scheduled.     Doctor Visits  - discussed the importance of doctor visits Reviewed Signs and symptoms of infection  Patient Self Care Activities:  Attend all scheduled provider appointments Call pharmacy for medication refills 3-7 days in advance of running out of medications Call provider office for new concerns or questions  Notify RN Care Manager of TOC call rescheduling needs Participate in Transition of Care Program/Attend TOC scheduled calls Take medications as prescribed   call office if I gain more than 2 pounds in one day or 5 pounds in one week keep legs up while sitting use salt in moderation watch for swelling in feet, ankles and legs every day weigh myself daily check pulse (heart) rate before taking medicine check pulse (heart) rate once a day keep all lab appointments You have a new or higher fever. You are coughing more deeply or more often. You are not getting better after 2 days (48 hours). You do not  get better as expected.  Plan:  The patient has been provided with contact information for the care management team and has been advised to call with any health related questions or concerns.         Patient verbalizes understanding of instructions and care plan provided today and agrees to view in MyChart. Active MyChart status and patient understanding of how to access instructions and care plan via MyChart confirmed with patient.     The patient has been provided with contact information for the care management team and has been advised to call with any health related questions or concerns.   Please call the care guide team at [phone] if you need to cancel or reschedule your appointment.   Please call the Suicide and Crisis Lifeline: 988 call the [place]  National Suicide Prevention Lifeline: 365-459-5319 or TTY: 606 510 6586 TTY 409-404-0144) to talk to a trained counselor if you are experiencing a Mental Health or Behavioral Health Crisis or need someone to talk to.  Roark Rufo J. Marynell Bies RN, MSN [facility], [facility] RN Care Manager Direct Dial: [phone]  Fax: [phone] Website: [email or web address]

## 2024-05-17 ENCOUNTER — Other Ambulatory Visit: Payer: Self-pay

## 2024-05-17 NOTE — Progress Notes (Signed)
 Specialty Pharmacy Refill Coordination Note  Denzil Mceachron is a 68 y.o. male contacted today regarding refills of specialty medication(s) Binimetinib  (Mektovi ); Encorafenib  (Braftovi )   Patient requested [name] at [facility] at [place] date: 05/21/24   Medication will be filled on 05/20/24.

## 2024-05-20 ENCOUNTER — Ambulatory Visit: Admitting: Nurse Practitioner

## 2024-05-20 ENCOUNTER — Ambulatory Visit: Attending: Internal Medicine | Admitting: *Deleted

## 2024-05-20 ENCOUNTER — Other Ambulatory Visit: Payer: Self-pay

## 2024-05-20 DIAGNOSIS — I4891 Unspecified atrial fibrillation: Secondary | ICD-10-CM | POA: Insufficient documentation

## 2024-05-20 DIAGNOSIS — Z5181 Encounter for therapeutic drug level monitoring: Secondary | ICD-10-CM | POA: Diagnosis present

## 2024-05-20 LAB — POCT INR: INR: 8 — AB (ref 2.0–3.0)

## 2024-05-20 NOTE — Patient Instructions (Addendum)
 D/C from hospital on warfarin 5mg  daily except 10mg  Mondays and Friday  INR at D/C was 2.7 Has been on amiodarone  200mg  twice daily.  Decreased amio to 1 tablet daily starting today. Hold warfarin x 6 days then start 5mg  daily. Recheck on 10/13 Pt is in a wheelchair and SOB.  He does not feel like he can go to the lab.  Has appt with PCP tomorrow to discuss O2.  Pt denies bleeding.  Bleeding and fall precautions discussed with pt and he verbalized understanding.

## 2024-05-20 NOTE — Progress Notes (Signed)
 INR >8.0; Please see anticoagulation encounter

## 2024-05-21 ENCOUNTER — Other Ambulatory Visit (HOSPITAL_COMMUNITY): Payer: Self-pay

## 2024-05-22 ENCOUNTER — Other Ambulatory Visit: Payer: Self-pay

## 2024-05-22 NOTE — Transitions of Care (Post Inpatient/ED Visit) (Signed)
 Transition of Care week 2  Visit Note  05/22/2024  Name: Jose Joseph MRN: [id number]          DOB: 20-Apr-1956  Situation: Patient enrolled in [facility] 30-day program. Visit completed with patient by telephone.   Background:   Initial Transition Care Management Follow-up Telephone Call    Past Medical History:  Diagnosis Date   Arthritis    Asthma    Atrial fibrillation (HCC)    Cancer (HCC)    Complication of anesthesia    after his neck surgery pt. 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 Fib. and heart failure  Reviewed A. Fib and heart failure and when to seek medical attention Cardiovascular Management Strategies: Routine screening, Medication therapy Weight: 256 lb (116.1 kg) Cardiovascular Self-Management Outcome: 4 (good)  Respiratory Respiratory Symptoms Reported: Shortness of breath Other Respiratory Symptoms: Reports some shortness of breath with exertion.  discussed COVID and shortness breath.  advised on seeking medical attention if symptoms get worse. Respiratory Management Strategies: Medication therapy, Routine screening Respiratory Self-Management Outcome: 3 (uncertain)  Endocrine Endocrine Symptoms Reported: No symptoms reported Is patient  diabetic?: No    Gastrointestinal Gastrointestinal Symptoms Reported: No symptoms reported      Genitourinary Genitourinary Symptoms Reported: Frequency Additional Genitourinary Details: Chronic issue with frequency    Integumentary Integumentary Symptoms Reported: No symptoms reported    Musculoskeletal Musculoskelatal Symptoms Reviewed: Unsteady gait, Difficulty walking Other Musculoskeletal Symptoms: Chronic back pain.  Uses rollator with ambulation Musculoskeletal Management Strategies: Medication therapy, Routine screening, Exercise Musculoskeletal Self-Management Outcome: 3 (uncertain)      Psychosocial Psychosocial Symptoms Reported: No symptoms reported         There were no vitals filed for this visit.  Medications Reviewed Today     Reviewed by Vanilla Heatherington, RN (Case Manager) on 05/22/24 at 1259  Med List Status: <None>   Medication Order Taking? Sig Documenting Provider Last Dose Status Informant  albuterol  (VENTOLIN  HFA) 108 (90 Base) MCG/ACT inhaler [id number] Yes Inhale 1 puff into the lungs every 4 (four) hours as needed for wheezing. Tobie Yetta HERO, MD  Active Self, Pharmacy Records  amiodarone  (PACERONE ) 200 MG tablet [id number] Yes Take 1 tablet (200 mg total) by mouth 2 (two) times daily for 6 days, THEN 1 tablet (200 mg total) daily. Laurence Locus, DO  Active   binimetinib  (MEKTOVI ) 15 MG tablet [id number] Yes Take 3 tablets (45 mg total) by mouth 2 (two) times daily. Kandala, Hyndavi, MD  Active   budesonide -glycopyrrolate -formoterol  (BREZTRI  AEROSPHERE) 160-9-4.8 MCG/ACT AERO inhaler [id number] Yes Inhale 2 puffs into the lungs in the morning and at bedtime. Tobie Yetta HERO, MD  Active Self, Pharmacy Records  cetirizine (ZYRTEC) 10 MG tablet [id number] Yes Take 10 mg by mouth  in the morning. [provider]  Active Self, Pharmacy Records  citalopram  (CELEXA ) 20 MG tablet [id number] Yes Take 20 mg by mouth in the morning. [provider]  Active Self,  Pharmacy Records  dapagliflozin propanediol (FARXIGA) 10 MG TABS tablet [id number] Yes Take 1 tablet (10 mg total) by mouth daily. Laurence Locus, DO  Active   encorafenib  (BRAFTOVI ) 75 MG capsule [id number] Yes Take 6 capsules (450 mg total) by mouth daily. Kandala, Hyndavi, MD  Active   EPINEPHrine  0.3 mg/0.3 mL IJ SOAJ injection [id number] Yes Inject 0.3 mg into the muscle as needed for anaphylaxis. [provider]  Active Self, Pharmacy Records  heparin  lock flush 100 unit/mL [id number]   Rogers Hai, MD  Active   metoprolol  succinate (TOPROL -XL) 100 MG 24 hr tablet [id number] Yes Take 1 tablet (100 mg total) by mouth daily. Take with or immediately following a meal. Laurence Locus, DO  Active   Oxycodone  HCl 10 MG TABS [id number] Yes Take 1 tablet (10 mg total) by mouth every 4 (four) hours as needed for severe pain (pain score 7-10). Laurence Locus, DO  Active   prochlorperazine  (COMPAZINE ) 10 MG tablet [id number] Yes TAKE 1 TABLET(10 MG) BY MOUTH EVERY 6 HOURS AS NEEDED Rogers Hai, MD  Active Self, Pharmacy Records  tamsulosin  (FLOMAX ) 0.4 MG CAPS capsule [id number] Yes Take 1 capsule (0.4 mg total) by mouth daily. Laurence Locus, DO  Active   warfarin (COUMADIN ) 10 MG tablet [id number] Yes Or as directed by coumadin  clinic.  Take 10 mg M, F and 5 mg all other days Laurence Locus, DO  Active   Med List Note Roselee, Teena BRAVO, CPhT 05/09/24 1703): Mektovi  and Braftovi  filled at [facility] (Specialty)             Goals Addressed             This Visit's Progress    VBCI Transitions of Care (TOC) Care Plan       Problems:  Recent Hospitalization for treatment of Atrial Fibrillation and CHF Knowledge Deficit Related to Atrial Fibrillation, CHF and pneumonia  Goal:  Over the next 30 days, the patient will not experience hospital readmission  Interventions:  Transitions of Care: 05/22/24  Patient reports he feels so-so today.  He reports wife with recent COVID.  He  denies having COVID.  Discussed signs of COVID and getting to the hospital if he worsens.  Reviewed A. Fibrillation and heart failure. Patient weighs daily.  Most recent weight 256 lbs.  Advised when to notify physician and when to return to the hospital.  Follow up appointments scheduled.     Doctor Visits  - discussed the importance of doctor visits Reviewed Signs and symptoms of infection  Patient Self Care Activities:  Attend all scheduled provider appointments Call pharmacy for medication refills 3-7 days in advance of running out of medications Call provider office for new concerns or questions  Notify RN Care Manager of TOC call rescheduling needs Participate in Transition of Care Program/Attend TOC scheduled calls Take medications as prescribed   call office if I gain more than 2 pounds in one day or 5 pounds in one week keep legs up while sitting use salt in moderation watch for swelling in feet, ankles and legs every day weigh myself daily check pulse (heart) rate before taking medicine check pulse (heart) rate once a day keep all lab appointments You have a new or higher fever. You are coughing more deeply or more  often. You are not getting better after 2 days (48 hours). You do not get better as expected.  Plan:  The patient has been provided with contact information for the care management team and has been advised to call with any health related questions or concerns.         Recommendation:   Continue Current Plan of Care  Follow Up Plan:   Telephone follow-up in 1 week  Glen Kesinger J. Ben Habermann RN, MSN [facility], [facility] Health RN Care Manager Direct Dial: [phone]  Fax: [phone] Website: [email or web address]

## 2024-05-22 NOTE — Patient Instructions (Signed)
 Visit Information  Thank you for taking time to visit with me today. Please don't hesitate to contact me if I can be of assistance to you before our next scheduled telephone appointment.  Our next appointment is by telephone on 05/29/24 at 100 pm  Following is a copy of your care plan:   Goals Addressed             This Visit's Progress    VBCI Transitions of Care (TOC) Care Plan       Problems:  Recent Hospitalization for treatment of Atrial Fibrillation and CHF Knowledge Deficit Related to Atrial Fibrillation, CHF and pneumonia  Goal:  Over the next 30 days, the patient will not experience hospital readmission  Interventions:  Transitions of Care: 05/22/24  Patient reports he feels so-so today.  He reports wife with recent COVID.  He denies having COVID.  Discussed signs of COVID and getting to the hospital if he worsens.  Reviewed A. Fibrillation and heart failure. Patient weighs daily.  Most recent weight 256 lbs.  Advised when to notify physician and when to return to the hospital.  Follow up appointments scheduled.     Doctor Visits  - discussed the importance of doctor visits Reviewed Signs and symptoms of infection  Patient Self Care Activities:  Attend all scheduled provider appointments Call pharmacy for medication refills 3-7 days in advance of running out of medications Call provider office for new concerns or questions  Notify RN Care Manager of TOC call rescheduling needs Participate in Transition of Care Program/Attend TOC scheduled calls Take medications as prescribed   call office if I gain more than 2 pounds in one day or 5 pounds in one week keep legs up while sitting use salt in moderation watch for swelling in feet, ankles and legs every day weigh myself daily check pulse (heart) rate before taking medicine check pulse (heart) rate once a day keep all lab appointments You have a new or higher fever. You are coughing more deeply or more often. You are  not getting better after 2 days (48 hours). You do not get better as expected.  Plan:  The patient has been provided with contact information for the care management team and has been advised to call with any health related questions or concerns.         Patient verbalizes understanding of instructions and care plan provided today and agrees to view in MyChart. Active MyChart status and patient understanding of how to access instructions and care plan via MyChart confirmed with patient.     The patient has been provided with contact information for the care management team and has been advised to call with any health related questions or concerns.   Please call the care guide team at [phone] if you need to cancel or reschedule your appointment.   Please call the Suicide and Crisis Lifeline: 988 if you are experiencing a Mental Health or Behavioral Health Crisis or need someone to talk to.  Tydarius Yawn J. Doneta Bayman RN, MSN [facility], [facility] RN Care Manager Direct Dial: [phone]  Fax: [phone] Website: [email or web address]

## 2024-05-24 ENCOUNTER — Other Ambulatory Visit: Payer: Self-pay

## 2024-05-24 ENCOUNTER — Encounter (HOSPITAL_COMMUNITY): Payer: Self-pay | Admitting: Emergency Medicine

## 2024-05-24 ENCOUNTER — Emergency Department (HOSPITAL_COMMUNITY)

## 2024-05-24 ENCOUNTER — Observation Stay (HOSPITAL_COMMUNITY)
Admission: EM | Admit: 2024-05-24 | Discharge: 2024-05-25 | Disposition: A | Discharge status: Home / Self Care | Attending: Family Medicine | Admitting: Family Medicine

## 2024-05-24 DIAGNOSIS — J9601 Acute respiratory failure with hypoxia: Secondary | ICD-10-CM | POA: Diagnosis not present

## 2024-05-24 DIAGNOSIS — J45909 Unspecified asthma, uncomplicated: Secondary | ICD-10-CM | POA: Diagnosis not present

## 2024-05-24 DIAGNOSIS — Z85118 Personal history of other malignant neoplasm of bronchus and lung: Secondary | ICD-10-CM | POA: Diagnosis not present

## 2024-05-24 DIAGNOSIS — Z8673 Personal history of transient ischemic attack (TIA), and cerebral infarction without residual deficits: Secondary | ICD-10-CM | POA: Insufficient documentation

## 2024-05-24 DIAGNOSIS — Z87891 Personal history of nicotine dependence: Secondary | ICD-10-CM | POA: Insufficient documentation

## 2024-05-24 DIAGNOSIS — I4581 Long QT syndrome: Secondary | ICD-10-CM | POA: Insufficient documentation

## 2024-05-24 DIAGNOSIS — J441 Chronic obstructive pulmonary disease with (acute) exacerbation: Principal | ICD-10-CM | POA: Diagnosis present

## 2024-05-24 DIAGNOSIS — U071 COVID-19: Secondary | ICD-10-CM | POA: Diagnosis not present

## 2024-05-24 DIAGNOSIS — C3490 Malignant neoplasm of unspecified part of unspecified bronchus or lung: Secondary | ICD-10-CM | POA: Diagnosis present

## 2024-05-24 DIAGNOSIS — Z7901 Long term (current) use of anticoagulants: Secondary | ICD-10-CM

## 2024-05-24 DIAGNOSIS — I110 Hypertensive heart disease with heart failure: Secondary | ICD-10-CM | POA: Diagnosis not present

## 2024-05-24 DIAGNOSIS — I480 Paroxysmal atrial fibrillation: Secondary | ICD-10-CM | POA: Diagnosis not present

## 2024-05-24 DIAGNOSIS — Z96642 Presence of left artificial hip joint: Secondary | ICD-10-CM | POA: Diagnosis not present

## 2024-05-24 DIAGNOSIS — R0602 Shortness of breath: Secondary | ICD-10-CM | POA: Diagnosis present

## 2024-05-24 DIAGNOSIS — I5030 Unspecified diastolic (congestive) heart failure: Secondary | ICD-10-CM | POA: Diagnosis not present

## 2024-05-24 DIAGNOSIS — J449 Chronic obstructive pulmonary disease, unspecified: Secondary | ICD-10-CM

## 2024-05-24 LAB — CBC WITH DIFFERENTIAL/PLATELET
Abs Immature Granulocytes: 0.74 K/uL — ABNORMAL HIGH (ref 0.00–0.07)
Basophils Absolute: 0.1 K/uL (ref 0.0–0.1)
Basophils Relative: 0 %
Eosinophils Absolute: 1.5 K/uL — ABNORMAL HIGH (ref 0.0–0.5)
Eosinophils Relative: 6 %
HCT: 40 % (ref 39.0–52.0)
Hemoglobin: 12.5 g/dL — ABNORMAL LOW (ref 13.0–17.0)
Immature Granulocytes: 3 %
Lymphocytes Relative: 5 %
Lymphs Abs: 1.1 K/uL (ref 0.7–4.0)
MCH: 28.3 pg (ref 26.0–34.0)
MCHC: 31.3 g/dL (ref 30.0–36.0)
MCV: 90.5 fL (ref 80.0–100.0)
Monocytes Absolute: 2.2 K/uL — ABNORMAL HIGH (ref 0.1–1.0)
Monocytes Relative: 10 %
Neutro Abs: 17.3 K/uL — ABNORMAL HIGH (ref 1.7–7.7)
Neutrophils Relative %: 76 %
Platelets: 229 K/uL (ref 150–400)
RBC: 4.42 MIL/uL (ref 4.22–5.81)
RDW: 13.7 % (ref 11.5–15.5)
WBC: 23 K/uL — ABNORMAL HIGH (ref 4.0–10.5)
nRBC: 0 % (ref 0.0–0.2)

## 2024-05-24 LAB — COMPREHENSIVE METABOLIC PANEL WITH GFR
ALT: 19 U/L (ref 0–44)
AST: 33 U/L (ref 15–41)
Albumin: 3.1 g/dL — ABNORMAL LOW (ref 3.5–5.0)
Alkaline Phosphatase: 135 U/L — ABNORMAL HIGH (ref 38–126)
Anion gap: 16 — ABNORMAL HIGH (ref 5–15)
BUN: 41 mg/dL — ABNORMAL HIGH (ref 8–23)
CO2: 24 mmol/L (ref 22–32)
Calcium: 9.6 mg/dL (ref 8.9–10.3)
Chloride: 102 mmol/L (ref 98–111)
Creatinine, Ser: 1.4 mg/dL — ABNORMAL HIGH (ref 0.61–1.24)
GFR, Estimated: 54 mL/min — ABNORMAL LOW (ref >60)
Glucose, Bld: 112 mg/dL — ABNORMAL HIGH (ref 70–99)
Potassium: 4 mmol/L (ref 3.5–5.1)
Sodium: 141 mmol/L (ref 135–145)
Total Bilirubin: 0.3 mg/dL (ref 0.0–1.2)
Total Protein: 6.7 g/dL (ref 6.5–8.1)

## 2024-05-24 LAB — RESP PANEL BY RT-PCR (RSV, FLU A&B, COVID)  RVPGX2
Influenza A by PCR: NEGATIVE
Influenza B by PCR: NEGATIVE
Resp Syncytial Virus by PCR: NEGATIVE
SARS Coronavirus 2 by RT PCR: POSITIVE — AB

## 2024-05-24 LAB — PROTIME-INR
INR: 3.1 — ABNORMAL HIGH (ref 0.8–1.2)
Prothrombin Time: 33.1 s — ABNORMAL HIGH (ref 11.4–15.2)

## 2024-05-24 LAB — LACTIC ACID, PLASMA
Lactic Acid, Venous: 2.1 mmol/L (ref 0.5–1.9)
Lactic Acid, Venous: 1.7 mmol/L (ref 0.5–1.9)

## 2024-05-24 LAB — PROCALCITONIN: Procalcitonin: 0.27 ng/mL

## 2024-05-24 MED ORDER — AZITHROMYCIN 250 MG PO TABS
500.0000 mg | ORAL_TABLET | Freq: Every day | ORAL | Status: DC
  Administered 2024-05-25: 500 mg via ORAL
  Filled 2024-05-24: qty 2

## 2024-05-24 MED ORDER — PREDNISONE 20 MG PO TABS
40.0000 mg | ORAL_TABLET | Freq: Every day | ORAL | Status: DC
  Administered 2024-05-25: 40 mg via ORAL
  Filled 2024-05-24: qty 2

## 2024-05-24 MED ORDER — SODIUM CHLORIDE 0.9 % IV SOLN
1.0000 g | Freq: Once | INTRAVENOUS | Status: AC
  Administered 2024-05-24: 1 g via INTRAVENOUS
  Filled 2024-05-24: qty 10

## 2024-05-24 MED ORDER — LACTATED RINGERS IV BOLUS
500.0000 mL | Freq: Once | INTRAVENOUS | Status: AC
  Administered 2024-05-24: 500 mL via INTRAVENOUS

## 2024-05-24 MED ORDER — LORATADINE 10 MG PO TABS
10.0000 mg | ORAL_TABLET | Freq: Every day | ORAL | Status: DC
  Administered 2024-05-25: 10 mg via ORAL
  Filled 2024-05-24: qty 1

## 2024-05-24 MED ORDER — BUDESON-GLYCOPYRROL-FORMOTEROL 160-9-4.8 MCG/ACT IN AERO
2.0000 | INHALATION_SPRAY | Freq: Two times a day (BID) | RESPIRATORY_TRACT | Status: DC
  Administered 2024-05-24 – 2024-05-25 (×2): 2 via RESPIRATORY_TRACT
  Filled 2024-05-24: qty 5.9

## 2024-05-24 MED ORDER — CITALOPRAM HYDROBROMIDE 20 MG PO TABS
20.0000 mg | ORAL_TABLET | Freq: Every morning | ORAL | Status: DC
  Administered 2024-05-25: 20 mg via ORAL
  Filled 2024-05-24: qty 1

## 2024-05-24 MED ORDER — ACETAMINOPHEN 650 MG RE SUPP: 650.0000 mg | Freq: Four times a day (QID) | RECTAL | Status: DC | PRN

## 2024-05-24 MED ORDER — TAMSULOSIN HCL 0.4 MG PO CAPS
0.4000 mg | ORAL_CAPSULE | Freq: Every day | ORAL | Status: DC
  Administered 2024-05-25: 0.4 mg via ORAL
  Filled 2024-05-24: qty 1

## 2024-05-24 MED ORDER — BUDESON-GLYCOPYRROL-FORMOTEROL 160-9-4.8 MCG/ACT IN AERO: 2.0000 | INHALATION_SPRAY | Freq: Two times a day (BID) | RESPIRATORY_TRACT | Status: DC

## 2024-05-24 MED ORDER — SODIUM CHLORIDE 0.9 % IV SOLN
1.0000 g | INTRAVENOUS | Status: DC
  Administered 2024-05-25: 1 g via INTRAVENOUS
  Filled 2024-05-24 (×2): qty 10

## 2024-05-24 MED ORDER — IPRATROPIUM-ALBUTEROL 0.5-2.5 (3) MG/3ML IN SOLN
3.0000 mL | Freq: Once | RESPIRATORY_TRACT | Status: AC
  Administered 2024-05-24: 3 mL via RESPIRATORY_TRACT
  Filled 2024-05-24: qty 3

## 2024-05-24 MED ORDER — HYDROCODONE-ACETAMINOPHEN 5-325 MG PO TABS
1.0000 | ORAL_TABLET | Freq: Four times a day (QID) | ORAL | Status: DC | PRN
  Administered 2024-05-24 – 2024-05-25 (×2): 1 via ORAL
  Filled 2024-05-24 (×2): qty 1

## 2024-05-24 MED ORDER — ALBUTEROL SULFATE (2.5 MG/3ML) 0.083% IN NEBU: 2.5000 mg | INHALATION_SOLUTION | RESPIRATORY_TRACT | Status: DC | PRN

## 2024-05-24 MED ORDER — IPRATROPIUM-ALBUTEROL 0.5-2.5 (3) MG/3ML IN SOLN
3.0000 mL | Freq: Four times a day (QID) | RESPIRATORY_TRACT | Status: DC
  Administered 2024-05-24 – 2024-05-25 (×3): 3 mL via RESPIRATORY_TRACT
  Filled 2024-05-24 (×3): qty 3

## 2024-05-24 MED ORDER — AZITHROMYCIN 250 MG PO TABS
500.0000 mg | ORAL_TABLET | Freq: Once | ORAL | Status: AC
  Administered 2024-05-24: 500 mg via ORAL
  Filled 2024-05-24: qty 2

## 2024-05-24 MED ORDER — ACETAMINOPHEN 325 MG PO TABS: 650.0000 mg | ORAL_TABLET | Freq: Four times a day (QID) | ORAL | Status: DC | PRN

## 2024-05-24 MED ORDER — AMIODARONE HCL 200 MG PO TABS
200.0000 mg | ORAL_TABLET | Freq: Every day | ORAL | Status: DC
  Administered 2024-05-24 – 2024-05-25 (×2): 200 mg via ORAL
  Filled 2024-05-24 (×2): qty 1

## 2024-05-24 MED ORDER — METHYLPREDNISOLONE SODIUM SUCC 125 MG IJ SOLR: 125.0000 mg | Freq: Once | INTRAMUSCULAR | Status: DC

## 2024-05-24 MED ORDER — METOPROLOL SUCCINATE ER 50 MG PO TB24: 100.0000 mg | ORAL_TABLET | Freq: Every day | ORAL | Status: DC

## 2024-05-24 NOTE — ED Notes (Signed)
 Xray at [facility], family x2 into room. Blood sent

## 2024-05-24 NOTE — ED Notes (Signed)
 Xray at [facility]

## 2024-05-24 NOTE — ED Notes (Signed)
 Date and time results received: 05/24/24 1118  Test: Lactic Acid Critical Value: 2.1  Name of Provider Notified: Suellen Cantor, PA-C

## 2024-05-24 NOTE — Progress Notes (Signed)
 PHARMACY - ANTICOAGULATION CONSULT NOTE  Pharmacy Consult for warfarin Indication: atrial fibrillation  Allergies  Allergen Reactions   Bee Venom Anaphylaxis    syncope    Patient Measurements: Height: 6' 1 (185.4 cm) Weight: 116 kg (255 lb 11.7 oz) IBW/kg (Calculated) : 79.9 HEPARIN  DW (KG): 104.7  Vital Signs: Temp: 97.7 F (36.5 C) (10/10 1344) Temp Source: Oral (10/10 1344) BP: 109/69 (10/10 1344) Pulse Rate: 83 (10/10 1344)  Labs: Recent Labs    05/24/24 1026  HGB 12.5*  HCT 40.0  PLT 229  LABPROT 33.1*  INR 3.1*  CREATININE 1.40*    Estimated Creatinine Clearance: 65.5 mL/min (A) (by C-G formula based on SCr of 1.4 mg/dL (H)).   Medical History: Past Medical History:  Diagnosis Date   Arthritis    Asthma    Atrial fibrillation (HCC)    Cancer (HCC)    Complication of anesthesia    after his neck surgery pt. 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 18 g 1 Past Week   amiodarone  (PACERONE ) 200 MG tablet Take 1 tablet (200 mg total) by mouth 2 (two) times daily for 6 days, THEN 1 tablet (200 mg total) daily. 42 tablet 0 05/24/2024 Morning   binimetinib  (MEKTOVI ) 15 MG tablet Take 3 tablets (45 mg total) by mouth 2 (two) times daily. 180 tablet 0 05/23/2024 Evening   budesonide -glycopyrrolate -formoterol  (BREZTRI  AEROSPHERE) 160-9-4.8 MCG/ACT AERO inhaler Inhale 2 puffs into the lungs in the  morning and at bedtime. 10.7 g 1 05/23/2024 Morning   cetirizine (ZYRTEC) 10 MG tablet Take 10 mg by mouth in the morning.   05/24/2024 Morning   citalopram  (CELEXA ) 20 MG tablet Take 20 mg by mouth in the morning.   05/24/2024 Morning   dapagliflozin propanediol (FARXIGA) 10 MG TABS tablet Take 1 tablet (10 mg total) by mouth daily. 30 tablet 0 05/24/2024 Morning   encorafenib  (BRAFTOVI ) 75 MG capsule Take 6 capsules (450 mg total) by mouth daily. 180 capsule 0 05/23/2024 Evening   EPINEPHrine  0.3 mg/0.3 mL IJ SOAJ injection Inject 0.3 mg into the muscle as needed for anaphylaxis.   Taking As Needed   HYDROcodone-acetaminophen  (NORCO/VICODIN) 5-325 MG tablet Take 1 tablet by mouth every 6 (six) hours as needed.   05/23/2024   metoprolol  succinate (TOPROL -XL) 100 MG 24 hr tablet Take 1 tablet (100 mg total) by mouth daily. Take with or immediately following a meal. 30 tablet 0 05/24/2024 Morning   Oxycodone  HCl 10 MG TABS Take 1 tablet (10 mg total) by mouth every 4 (four) hours as needed for severe pain (pain score 7-10). 30 tablet 0 05/24/2024 at  5:00 AM   prochlorperazine  (COMPAZINE ) 10 MG tablet TAKE 1 TABLET(10 MG) BY MOUTH EVERY 6 HOURS AS NEEDED 60 tablet 2 05/24/2024 Morning   tamsulosin  (FLOMAX ) 0.4 MG CAPS capsule Take 1 capsule (0.4 mg total)  by mouth daily. 30 capsule 0 05/24/2024 Morning   warfarin (COUMADIN ) 10 MG tablet Or as directed by coumadin  clinic.  Take 10 mg M, F and 5 mg all other days   05/23/2024 Evening    Assessment: 68 y.o. male with a history of metastatic lung CA s/p XRT on oral therapy,  paroxysmal AFib s/p DCCV 9/26 on coumadin , COPD, hx CVA who presented to the ED by EMS today due to respiratory distress. Pharmacy asked to dose coumadin . Pt seen in anti-coag clinic 10/6 with INR 8 and told to hold warfarin until 05/27/24 then change dose to 5mg  daily.   INR 3.1, still supratherapeutic  Goal of Therapy:  INR 2-3 Monitor platelets by anticoagulation protocol: Yes    Plan:  No coumadin  today Daily PT-INR Monitor for S/S of bleeding  Cherlyn Boers, BS Pharm D, BCPS Clinical Pharmacist 05/24/2024,4:52 PM

## 2024-05-24 NOTE — ED Provider Notes (Signed)
 [place] EMERGENCY DEPARTMENT AT [facility] Provider Note   CSN: [id number] Arrival date & time: 05/24/24  9043     Patient presents with: Shortness of Breath   Jose Joseph is a 68 y.o. male.  History of persistent A-fib, metastatic lung adenocarcinoma, COPD, hypertension, CVA, HFpEF.  Patient presents to the ER today complaining of shortness of breath with wheezing that has been progressively worsening long-term but has been significant worse over the past week, he states he thought he was feeling better over the past couple of days but woke up this morning and was having a lot of wheezing and increased shortness of breath.  He denies any chest pain, no dizziness or diaphoresis.  Has lower extremity swelling or pain.  Denies fever chills or productive cough.  He does report generalized weakness which she states is a chronic problem for him.  Reports he is on oral medication for his lung cancer and completed radiation in May.  Had admission for rapid A-fib 9/25 - 05/14/2024 with unsuccessful DC cardioversion.  He is on Coumadin  for anticoagulation and amiodarone .  EMS gave patient 2 nebulizer treatments and 500 cc fluid bolus as patient had systolic blood pressure of 80s.  Patient reports low blood pressure as not abnormal for him.  Denies any dizziness.    Shortness of Breath Associated symptoms: wheezing   Associated symptoms: no abdominal pain, no chest pain, no fever and no vomiting        Prior to Admission medications   Medication Sig Start Date End Date Taking? Authorizing Provider  albuterol  (VENTOLIN  HFA) 108 (90 Base) MCG/ACT inhaler Inhale 1 puff into the lungs every 4 (four) hours as needed for wheezing. 01/02/24 01/01/25  Tobie Yetta HERO, MD  amiodarone  (PACERONE ) 200 MG tablet Take 1 tablet (200 mg total) by mouth 2 (two) times daily for 6 days, THEN 1 tablet (200 mg total) daily. 05/14/24 06/19/24  Laurence Locus, DO  binimetinib  (MEKTOVI ) 15 MG tablet Take 3  tablets (45 mg total) by mouth 2 (two) times daily. 05/14/24   Kandala, Hyndavi, MD  budesonide -glycopyrrolate -formoterol  (BREZTRI  AEROSPHERE) 160-9-4.8 MCG/ACT AERO inhaler Inhale 2 puffs into the lungs in the morning and at bedtime. 01/02/24   Patel, Pranav M, MD  cetirizine (ZYRTEC) 10 MG tablet Take 10 mg by mouth in the morning.    [provider]  citalopram  (CELEXA ) 20 MG tablet Take 20 mg by mouth in the morning.    [provider]  dapagliflozin propanediol (FARXIGA) 10 MG TABS tablet Take 1 tablet (10 mg total) by mouth daily. 05/15/24 06/14/24  Laurence Locus, DO  encorafenib  (BRAFTOVI ) 75 MG capsule Take 6 capsules (450 mg total) by mouth daily. 05/14/24   Davonna Siad, MD  EPINEPHrine  0.3 mg/0.3 mL IJ SOAJ injection Inject 0.3 mg into the muscle as needed for anaphylaxis. 02/14/24   [provider]  metoprolol  succinate (TOPROL -XL) 100 MG 24 hr tablet Take 1 tablet (100 mg total) by mouth daily. Take with or immediately following a meal. 05/15/24 06/14/24  Laurence Locus, DO  Oxycodone  HCl 10 MG TABS Take 1 tablet (10 mg total) by mouth every 4 (four) hours as needed for severe pain (pain score 7-10). 05/14/24   Laurence Locus, DO  prochlorperazine  (COMPAZINE ) 10 MG tablet TAKE 1 TABLET(10 MG) BY MOUTH EVERY 6 HOURS AS NEEDED 02/23/24   Rogers Hai, MD  tamsulosin  (FLOMAX ) 0.4 MG CAPS capsule Take 1 capsule (0.4 mg total) by mouth daily. 05/15/24 06/14/24  Laurence Locus, DO  warfarin (COUMADIN ) 10 MG tablet Or as directed by coumadin  clinic.  Take 10 mg M, F and 5 mg all other days 05/14/24   Laurence Locus, DO    Allergies: Bee venom    Review of Systems  Constitutional:  Negative for chills and fever.  HENT:  Negative for congestion.   Respiratory:  Positive for shortness of breath and wheezing.   Cardiovascular:  Negative for chest pain, palpitations and leg swelling.  Gastrointestinal:  Negative for abdominal pain, nausea and vomiting.  Neurological:  Negative for  dizziness, weakness and numbness.    Updated Vital Signs BP 96/62   Pulse 80   Temp 98 F (36.7 C) (Oral)   Resp 16   Ht 6' 1 (1.854 m)   Wt 116 kg   SpO2 91%   BMI 33.74 kg/m   Physical Exam Vitals and nursing note reviewed.  Constitutional:      General: He is not in acute distress.    Appearance: He is well-developed.  HENT:     Head: Normocephalic and atraumatic.  Eyes:     Conjunctiva/sclera: Conjunctivae normal.  Cardiovascular:     Rate and Rhythm: Normal rate and regular rhythm.     Heart sounds: No murmur heard. Pulmonary:     Effort: Pulmonary effort is normal. No respiratory distress.     Breath sounds: Examination of the right-upper field reveals wheezing. Examination of the left-upper field reveals wheezing. Examination of the right-middle field reveals wheezing. Examination of the left-middle field reveals wheezing. Examination of the right-lower field reveals wheezing. Examination of the left-lower field reveals wheezing. Wheezing present.  Abdominal:     Palpations: Abdomen is soft.     Tenderness: There is no abdominal tenderness.  Musculoskeletal:        General: No swelling.     Cervical back: Neck supple.  Skin:    General: Skin is warm and dry.     Capillary Refill: Capillary refill takes less than 2 seconds.  Neurological:     General: No focal deficit present.     Mental Status: He is alert and oriented to person, place, and time.  Psychiatric:        Mood and Affect: Mood normal.     (all labs ordered are listed, but only abnormal results are displayed) Labs Reviewed - No data to display  EKG: None  Radiology: No results found.   Procedures   Medications Ordered in the ED - No data to display                                  Medical Decision Making This patient presents to the ED for concern of cough, wheezing, shortness of breath, this involves an extensive number of treatment options, and is a complaint that carries with it a  high risk of complications and morbidity.  The differential diagnosis includes pneumonia, COPD exacerbation, respiratory failure, CHF exacerbation, PE, pleural effusion, other   Co morbidities that complicate the patient evaluation :   A-fib, HFpEF, lung cancer   Additional history obtained:  Additional history obtained from EMR External records from outside source obtained and reviewed including prior notes, labs, imagin   Lab Tests:  I Ordered, and personally interpreted labs.  The pertinent results include: COVID-positive Lactic acid 2.1 CMP with mild elevation in BUN/creatinine at 41 and 1.4 INR 3.1 CBC with white blood cell count  23, hemoglobin 12.5   Imaging Studies ordered:  I ordered imaging studies including chest x-ray which shows persistent left hilar opacity I independently visualized and interpreted imaging within scope of identifying emergent findings  I agree with the radiologist interpretation   Cardiac Monitoring: / EKG:  The patient was maintained on a cardiac monitor.  I personally viewed and interpreted the cardiac monitored which showed an underlying rhythm of: Sinus rhythm   Consultations Obtained:  I requested consultation with the hospitalist, Dr. Bryn,  and discussed lab and imaging findings as well as pertinent plan - they recommend: admission.   Problem List / ED Course / Critical interventions / Medication management  Patient satting 88% on room air at rest with cough and increased shortness of breath, diffuse wheezing.  He has had total of 3 nebulizer treatments and 125 mg of Solu-Medrol  between here and with EMS.  EMS I given the Solu-Medrol  and 2 nebulizer treatments.  His work of breathing is now improved and he is feeling better, lying back but still hypoxic.  He is not on oxygen  at baseline.  He was positive for COVID and had increase in his baseline leukocytosis, so I did treat for community-acquired pneumonia with Rocephin  and azithromycin .   He was reportedly hypotensive with EMS but has been normotensive here after receiving IV fluids with EMS. I ordered medication including duoneb  for wheezing  Reevaluation of the patient after these medicines showed that the patient improved I have reviewed the patients home medicines and have made adjustments as needed   Social Determinants of Health:  Patient lives at home       Amount and/or Complexity of Data Reviewed Labs: ordered. Radiology: ordered.  Risk Prescription drug management. Decision regarding hospitalization.        Final diagnoses:  None    ED Discharge Orders     None          Suellen Sherran LABOR, PA-C 05/24/24 1245    Simon Lavonia SAILOR, MD 05/24/24 1331

## 2024-05-24 NOTE — ED Notes (Signed)
Pt in bed with eyes closed, resps even and unlabored, pt emitting a snoring like noise

## 2024-05-24 NOTE — ED Triage Notes (Addendum)
 Pt bib rcems for feeling sob that has progressively gotten worse over last few weeks. Pt states it was much worse this morning. Hx of copd, lung ca pt. Pt hypotensive w/ ems w/ systolic in 80s. Pt has hx of hypotensive  per ems since may. Pt given 500ml NS.   Pt given also given  5mg  albuterol  1 mg atrovent 125 mg solumedrol w/ ems.

## 2024-05-24 NOTE — H&P (Signed)
 History and Physical    Patient: Jose Joseph FMW:[id number] DOB: 05/15/1956 DOA: 05/24/2024 DOS: the patient was seen and examined on 05/24/2024 PCP: Sheryle Carwin, MD  Patient coming from: Home  Chief Complaint:  Chief Complaint  Patient presents with   Shortness of Breath   HPI: Jose Joseph is a 68 y.o. male with a history of metastatic lung CA s/p XRT on oral therapy,  paroxysmal AFib s/p DCCV 9/26 on coumadin , COPD, hx CVA who presented to the ED by EMS today due to respiratory distress. His wife has been ill but starting to improve but over the past week he has started feeling some shortness of breath, intermittent wheezing worse with exertion and became quite severe this morning to the point that he didn't think he could ride in a car to the ED. EMS found him to be hypoxic and wheezing, gave solumedrol, nebs x2, 500cc IVF due to hypotension, and brought him in. He was hypoxic, still wheezing, but wheezing improved after another neb, though still hypoxic so admission requested. Work up in ED revealed +covid-19 by PCR. CXR showed opacity, antibiotics also given. Lactic acid up to 2.1, subsequently normalized.   He denies chest pain or palpitations. BP improved/normal, respiratory effort normalized, just still needing 2L O2. He states his goal is to keep getting better and get out of here. His complaint on my interview is low back pain for the past several months into his hips. No weakness or numbness or urinary changes.   Review of Systems: As mentioned in the history of present illness. All other systems reviewed and are negative. Past Medical History:  Diagnosis Date   Arthritis    Asthma    Atrial fibrillation (HCC)    Cancer (HCC)    Complication of anesthesia    after his neck surgery pt. trying to get up and walk out   COPD (chronic obstructive pulmonary disease) (HCC)    COPD with acute exacerbation (HCC) 12/15/2023   Depression    History of kidney stones    2021    History of stroke 12/26/2023   Hypertension    Metastatic lung carcinoma, right (HCC) 08/2023   Pericardial effusion 12/26/2023   Status post total replacement of left hip 01/15/2021   Stroke [facility])    Past Surgical History:  Procedure Laterality Date   BRONCHIAL BIOPSY  11/21/2023   Procedure: BRONCHOSCOPY, WITH BIOPSY;  Surgeon: Shelah Lamar RAMAN, MD;  Location: [facility] ENDOSCOPY;  Service: Pulmonary;;   BRONCHIAL NEEDLE ASPIRATION BIOPSY  11/21/2023   Procedure: BRONCHOSCOPY, WITH NEEDLE ASPIRATION BIOPSY;  Surgeon: Shelah Lamar RAMAN, MD;  Location[facility] ENDOSCOPY;  Service: Pulmonary;;   CARDIOVERSION N/A 01/01/2024   Procedure: CARDIOVERSION;  Surgeon: Francyne Headland, MD;  Location: [facility] INVASIVE CV LAB;  Service: Cardiovascular;  Laterality: N/A;   CARDIOVERSION N/A 05/10/2024   Procedure: CARDIOVERSION;  Surgeon: Rolan Ezra RAMAN, MD;  Location: [facility] INVASIVE CV LAB;  Service: Cardiovascular;  Laterality: N/A;   COLONOSCOPY WITH PROPOFOL  N/A 01/19/2023   Procedure: COLONOSCOPY WITH PROPOFOL ;  Surgeon: Shaaron Lamar HERO, MD;  Location: [facility] ENDO SUITE;  Service: Endoscopy;  Laterality: N/A;  200pm, asa 2   ENDOBRONCHIAL ULTRASOUND Bilateral 11/21/2023   Procedure: ENDOBRONCHIAL ULTRASOUND (EBUS);  Surgeon: Shelah Lamar RAMAN, MD;  Location: [facility] ENDOSCOPY;  Service: Pulmonary;  Laterality: Bilateral;   HERNIA REPAIR     IR IMAGING GUIDED PORT INSERTION  12/14/2023   IR THORACENTESIS ASP PLEURAL SPACE W/IMG GUIDE  12/28/2023   KNEE  ARTHROCENTESIS     NECK SURGERY     POLYPECTOMY  01/19/2023   Procedure: POLYPECTOMY;  Surgeon: Shaaron Lamar HERO, MD;  Location: [facility] ENDO SUITE;  Service: Endoscopy;;   TOTAL HIP ARTHROPLASTY Left 01/15/2021   Procedure: LEFT TOTAL HIP ARTHROPLASTY ANTERIOR APPROACH;  Surgeon: Barbarann Oneil BROCKS, MD;  Location: [facility] OR;  Service: Orthopedics;  Laterality: Left;   TRANSESOPHAGEAL ECHOCARDIOGRAM (CATH LAB) N/A 01/01/2024   Procedure: TRANSESOPHAGEAL ECHOCARDIOGRAM;  Surgeon: Francyne Headland, MD;  Location: [facility] INVASIVE CV LAB;  Service: Cardiovascular;  Laterality: N/A;   TRANSESOPHAGEAL ECHOCARDIOGRAM (CATH LAB) N/A 05/10/2024   Procedure: TRANSESOPHAGEAL ECHOCARDIOGRAM;  Surgeon: Rolan Ezra RAMAN, MD;  Location: [facility] INVASIVE CV LAB;  Service: Cardiovascular;  Laterality: N/A;   VIDEO BRONCHOSCOPY WITH ENDOBRONCHIAL NAVIGATION Bilateral 11/21/2023   Procedure: VIDEO BRONCHOSCOPY WITH ENDOBRONCHIAL NAVIGATION;  Surgeon: Shelah Lamar RAMAN, MD;  Location: [facility] ENDOSCOPY;  Service: Pulmonary;  Laterality: Bilateral;   WRIST ARTHROPLASTY     Social History:  reports that he quit smoking about 11 years ago. His smoking use included cigarettes. He started smoking about 46 years ago. He has a 35 pack-year smoking history. He has been exposed to tobacco smoke. He has never used smokeless tobacco. He reports that he does not currently use alcohol after a past usage of about 2.0 standard drinks of alcohol per week. He reports current drug use. Drug: Marijuana.  Allergies  Allergen Reactions   Bee Venom Anaphylaxis    syncope    Family History  Problem Relation Age of Onset   Kidney cancer Sister    Prostate cancer Brother     Prior to Admission medications   Medication Sig Start Date End Date Taking? Authorizing Provider  albuterol  (VENTOLIN  HFA) 108 (90 Base) MCG/ACT inhaler Inhale 1 puff into the lungs every 4 (four) hours as needed for wheezing. 01/02/24 01/01/25 Yes Tobie Yetta HERO, MD  amiodarone  (PACERONE ) 200 MG tablet Take 1 tablet (200 mg total) by mouth 2 (two) times daily for 6 days, THEN 1 tablet (200 mg total) daily. 05/14/24 06/19/24 Yes Laurence Locus, DO  binimetinib  (MEKTOVI ) 15 MG tablet Take 3 tablets (45 mg total) by mouth 2 (two) times daily. 05/14/24  Yes Davonna Siad, MD  budesonide -glycopyrrolate -formoterol  (BREZTRI  AEROSPHERE) 160-9-4.8 MCG/ACT AERO inhaler Inhale 2 puffs into the lungs in the morning and at bedtime. 01/02/24  Yes Tobie Yetta HERO, MD  cetirizine  (ZYRTEC) 10 MG tablet Take 10 mg by mouth in the morning.   Yes [provider]  citalopram  (CELEXA ) 20 MG tablet Take 20 mg by mouth in the morning.   Yes [provider]  dapagliflozin propanediol (FARXIGA) 10 MG TABS tablet Take 1 tablet (10 mg total) by mouth daily. 05/15/24 06/14/24 Yes Laurence Locus, DO  encorafenib  (BRAFTOVI ) 75 MG capsule Take 6 capsules (450 mg total) by mouth daily. 05/14/24  Yes Davonna Siad, MD  EPINEPHrine  0.3 mg/0.3 mL IJ SOAJ injection Inject 0.3 mg into the muscle as needed for anaphylaxis. 02/14/24  Yes [provider]  HYDROcodone-acetaminophen  (NORCO/VICODIN) 5-325 MG tablet Take 1 tablet by mouth every 6 (six) hours as needed. 05/17/24  Yes [provider]  metoprolol  succinate (TOPROL -XL) 100 MG 24 hr tablet Take 1 tablet (100 mg total) by mouth daily. Take with or immediately following a meal. 05/15/24 06/14/24 Yes Laurence Locus, DO  Oxycodone  HCl 10 MG TABS Take 1 tablet (10 mg total) by mouth every 4 (four) hours as needed for severe pain (pain score 7-10). 05/14/24  Yes Laurence Locus, DO  prochlorperazine  (COMPAZINE ) 10 MG tablet TAKE 1 TABLET(10 MG) BY MOUTH EVERY 6 HOURS AS NEEDED 02/23/24  Yes Rogers Hai, MD  tamsulosin  (FLOMAX ) 0.4 MG CAPS capsule Take 1 capsule (0.4 mg total) by mouth daily. 05/15/24 06/14/24 Yes Laurence Locus, DO  warfarin (COUMADIN ) 10 MG tablet Or as directed by coumadin  clinic.  Take 10 mg M, F and 5 mg all other days 05/14/24  Yes Laurence Locus, DO    Physical Exam: Vitals:   05/24/24 1200 05/24/24 1215 05/24/24 1230 05/24/24 1344  BP: 103/71 105/66 103/69 109/69  Pulse: 83 83 83 83  Resp: (!) 22 (!) 24 (!) 24 (!) 22  Temp:   97.8 F (36.6 C) 97.7 F (36.5 C)  TempSrc:   Oral Oral  SpO2: 90% 92% 93% 96%  Weight:      Height:      Gen: No distress Pulm: No wheezing, no crackles, normal respiratory effort, though still a bit tachypneic  CV: RRR GI: Soft, NT, ND, +BS Neuro: Alert and oriented. No  new focal deficits. Ext: Warm, no deformities Skin: No rashes, lesions or ulcers on visualized skin   Data Reviewed: Covid-19 +PCR PCT 0.27 (previously negative) WBC 23k (17.3k PMNs, 2.2k mono's, 1.5k Eo's  INR 3.1 LA 2.1 > 1.7 Cr 1.4, BUN 41, AG 16 but bicarb 24. Alk Phos 135, other LFTs wnl.   CXR (personal interpretation): Stable coarse thickening on left perihilar area, unclear whether right midlung has increased linear opacity due to true increase or suspected inferior angulation of film.  ECG (personal interpretation): NSR, prolonged QT. Nonspecific, mild T changes. No ectopy.   Assessment and Plan: Acute hypoxic respiratory failure: Due to AECOPD precipitated by covid-19 infection +/- superimposed CAP.  - Continue supplemental oxygen  with aim to normalize respiratory effort and keep SpO2 > 89%.  - Treat contributing conditions as below  AECOPD:  - Continue nebs and redose steroids starting tmrw given wheezing is relieved.   Covid-19 infection:  - No directed Tx indicated at this time, giving steroids regardless.  - Continue contact/airborne isolation while admitted, follow CDC guidelines for isolation after discharge.  - Note he has pulmonary follow up 10/15. Depending on recovery, he may need to push that back.   Suspected superimposed CAP:  - Given immunocompromised status, elevated PCT, Neutrophilia above baseline, more focal opacity compared to prior, we will plan for 5 days abx.  PAF: Currently NSR.  - Continue amiodarone  (currently in the 200mg  daily part of taper) - Continue metoprolol    - Continue coumadin  per pharmacy (INR 3.1) - Continue telemetry. He does NOT tolerate AFib well. Has preserved LVEF.   Lung CA with bony metastases, cancer-associated pain:  - Continue routine Tx, continue home opioid prn  Prolonged QT interval:  - Avoid provocative agents, continue telemetry, maintain optimized electrolytes.    Advance Care Planning: Full code  Consults:  None  Family Communication: None  Severity of Illness: The appropriate patient status for this patient is OBSERVATION. Observation status is judged to be reasonable and necessary in order to provide the required intensity of service to ensure the patient's safety. The patient's presenting symptoms, physical exam findings, and initial radiographic and laboratory data in the context of their medical condition is felt to place them at decreased risk for further clinical deterioration. Furthermore, it is anticipated that the patient will be medically stable for discharge from the hospital within 2 midnights of admission.   Author: Bernardino KATHEE Come,  MD 05/24/2024 2:16 PM  For on call review [email or web address].

## 2024-05-25 DIAGNOSIS — J441 Chronic obstructive pulmonary disease with (acute) exacerbation: Secondary | ICD-10-CM | POA: Diagnosis not present

## 2024-05-25 DIAGNOSIS — R0602 Shortness of breath: Secondary | ICD-10-CM | POA: Diagnosis not present

## 2024-05-25 DIAGNOSIS — A419 Sepsis, unspecified organism: Secondary | ICD-10-CM | POA: Diagnosis not present

## 2024-05-25 LAB — COMPREHENSIVE METABOLIC PANEL WITH GFR
ALT: 17 U/L (ref 0–44)
AST: 25 U/L (ref 15–41)
Albumin: 3.1 g/dL — ABNORMAL LOW (ref 3.5–5.0)
Alkaline Phosphatase: 119 U/L (ref 38–126)
Anion gap: 11 (ref 5–15)
BUN: 37 mg/dL — ABNORMAL HIGH (ref 8–23)
CO2: 25 mmol/L (ref 22–32)
Calcium: 9.6 mg/dL (ref 8.9–10.3)
Chloride: 104 mmol/L (ref 98–111)
Creatinine, Ser: 1.24 mg/dL (ref 0.61–1.24)
GFR, Estimated: >60 mL/min (ref >60)
Glucose, Bld: 107 mg/dL — ABNORMAL HIGH (ref 70–99)
Potassium: 3.7 mmol/L (ref 3.5–5.1)
Sodium: 140 mmol/L (ref 135–145)
Total Bilirubin: <0.2 mg/dL (ref 0.0–1.2)
Total Protein: 6.4 g/dL — ABNORMAL LOW (ref 6.5–8.1)

## 2024-05-25 LAB — CBC
HCT: 37.9 % — ABNORMAL LOW (ref 39.0–52.0)
Hemoglobin: 11.6 g/dL — ABNORMAL LOW (ref 13.0–17.0)
MCH: 28.2 pg (ref 26.0–34.0)
MCHC: 30.6 g/dL (ref 30.0–36.0)
MCV: 92.2 fL (ref 80.0–100.0)
Platelets: 197 K/uL (ref 150–400)
RBC: 4.11 MIL/uL — ABNORMAL LOW (ref 4.22–5.81)
RDW: 13.9 % (ref 11.5–15.5)
WBC: 21.6 K/uL — ABNORMAL HIGH (ref 4.0–10.5)
nRBC: 0 % (ref 0.0–0.2)

## 2024-05-25 LAB — PROTIME-INR
INR: 2.5 — ABNORMAL HIGH (ref 0.8–1.2)
Prothrombin Time: 28 s — ABNORMAL HIGH (ref 11.4–15.2)

## 2024-05-25 MED ORDER — ALBUTEROL SULFATE 0.63 MG/3ML IN NEBU
1.0000 | INHALATION_SOLUTION | Freq: Four times a day (QID) | RESPIRATORY_TRACT | 0 refills | Status: AC | PRN
Start: 2024-05-25 — End: ?

## 2024-05-25 MED ORDER — AMOXICILLIN-POT CLAVULANATE 875-125 MG PO TABS: 1.0000 | ORAL_TABLET | Freq: Two times a day (BID) | ORAL | 0 refills | Status: AC

## 2024-05-25 MED ORDER — WARFARIN - PHARMACIST DOSING INPATIENT: Freq: Every day | Status: DC

## 2024-05-25 MED ORDER — LEVALBUTEROL HCL 0.63 MG/3ML IN NEBU: 0.6300 mg | INHALATION_SOLUTION | Freq: Four times a day (QID) | RESPIRATORY_TRACT | Status: DC | PRN

## 2024-05-25 MED ORDER — PREDNISONE 20 MG PO TABS: 40.0000 mg | ORAL_TABLET | Freq: Every day | ORAL | 0 refills | Status: AC

## 2024-05-25 MED ORDER — WARFARIN SODIUM 5 MG PO TABS
5.0000 mg | ORAL_TABLET | Freq: Once | ORAL | Status: DC
Start: 2024-05-25 — End: 2024-05-25

## 2024-05-25 MED ORDER — METOPROLOL SUCCINATE ER 50 MG PO TB24
50.0000 mg | ORAL_TABLET | Freq: Every day | ORAL | Status: DC
  Administered 2024-05-25: 50 mg via ORAL
  Filled 2024-05-25: qty 1

## 2024-05-25 NOTE — Progress Notes (Addendum)
 PHARMACY - ANTICOAGULATION CONSULT NOTE  Pharmacy Consult for warfarin Indication: atrial fibrillation  Allergies  Allergen Reactions   Bee Venom Anaphylaxis    syncope    Patient Measurements: Height: 6' 1 (185.4 cm) Weight: 116 kg (255 lb 11.7 oz) IBW/kg (Calculated) : 79.9 HEPARIN  DW (KG): 104.7  Vital Signs: Temp: 97.8 F (36.6 C) (10/11 0326) Temp Source: Oral (10/11 0326) BP: 108/60 (10/11 0326) Pulse Rate: 103 (10/11 0837)  Labs: Recent Labs    05/24/24 1026 05/25/24 0416  HGB 12.5* 11.6*  HCT 40.0 37.9*  PLT 229 197  LABPROT 33.1* 28.0*  INR 3.1* 2.5*  CREATININE 1.40* 1.24    Estimated Creatinine Clearance: 73.9 mL/min (by C-G formula based on SCr of 1.24 mg/dL).   Medical History: Past Medical History:  Diagnosis Date   Arthritis    Asthma    Atrial fibrillation (HCC)    Cancer (HCC)    Complication of anesthesia    after his neck surgery pt. 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 18 g 1 Past Week   amiodarone  (PACERONE ) 200 MG tablet Take 1 tablet (200 mg total) by mouth 2 (two) times daily for 6 days, THEN 1 tablet (200 mg total) daily. 42 tablet 0 05/24/2024 Morning   binimetinib  (MEKTOVI ) 15 MG tablet Take 3 tablets (45 mg total) by mouth 2 (two) times daily. 180 tablet 0 05/23/2024 Evening   budesonide -glycopyrrolate -formoterol  (BREZTRI  AEROSPHERE) 160-9-4.8 MCG/ACT AERO  inhaler Inhale 2 puffs into the lungs in the morning and at bedtime. 10.7 g 1 05/23/2024 Morning   cetirizine (ZYRTEC) 10 MG tablet Take 10 mg by mouth in the morning.   05/24/2024 Morning   citalopram  (CELEXA ) 20 MG tablet Take 20 mg by mouth in the morning.   05/24/2024 Morning   dapagliflozin propanediol (FARXIGA) 10 MG TABS tablet Take 1 tablet (10 mg total) by mouth daily. 30 tablet 0 05/24/2024 Morning   encorafenib  (BRAFTOVI ) 75 MG capsule Take 6 capsules (450 mg total) by mouth daily. 180 capsule 0 05/23/2024 Evening   EPINEPHrine  0.3 mg/0.3 mL IJ SOAJ injection Inject 0.3 mg into the muscle as needed for anaphylaxis.   Taking As Needed   HYDROcodone-acetaminophen  (NORCO/VICODIN) 5-325 MG tablet Take 1 tablet by mouth every 6 (six) hours as needed.   05/23/2024   metoprolol  succinate (TOPROL -XL) 100 MG 24 hr tablet Take 1 tablet (100 mg total) by mouth daily. Take with or immediately following a meal. 30 tablet 0 05/24/2024 Morning   Oxycodone  HCl 10 MG TABS Take 1 tablet (10 mg total) by mouth every 4 (four) hours as needed for severe pain (pain score 7-10). 30 tablet 0 05/24/2024 at  5:00 AM   prochlorperazine  (COMPAZINE ) 10 MG tablet TAKE 1 TABLET(10 MG) BY MOUTH EVERY 6 HOURS AS NEEDED 60 tablet 2 05/24/2024 Morning   tamsulosin  (FLOMAX ) 0.4 MG CAPS capsule  Take 1 capsule (0.4 mg total) by mouth daily. 30 capsule 0 05/24/2024 Morning   warfarin (COUMADIN ) 10 MG tablet Or as directed by coumadin  clinic.  Take 10 mg M, F and 5 mg all other days   05/23/2024 Evening    Assessment: 68 y.o. male with a history of metastatic lung CA s/p XRT on oral therapy,  paroxysmal AFib s/p DCCV 9/26 on coumadin , COPD, hx CVA who presented to the ED by EMS today due to respiratory distress. Pharmacy asked to dose coumadin . Pt seen in anti-coag clinic 10/6 with INR 8 and told to hold warfarin until 05/27/24 then change dose to 5mg  daily.   INR 3.1> 2.5, therapeutic Hgb 12.5> 11.6  , track and trend. Possible  hemodilution. No bleeding noted Goal of Therapy:  INR 2-3 Monitor platelets by anticoagulation protocol: Yes   Plan:  Coumadin  5mg  po x 1 today Daily PT-INR Monitor for S/S of bleeding  Cherlyn Boers, BS Pharm D, BCPS Clinical Pharmacist 05/25/2024,10:40 AM

## 2024-05-25 NOTE — Care Management Obs Status (Signed)
 MEDICARE OBSERVATION STATUS NOTIFICATION   Patient Details  Name: Jose Joseph MRN: [id number] Date of Birth: 1956-05-06   Medicare Observation Status Notification Given:  Yes    Sharlyne Stabs, RN 05/25/2024, 10:56 AM

## 2024-05-25 NOTE — Progress Notes (Signed)
   05/25/24 1057  TOC Brief Assessment  Insurance and Status Reviewed  Patient has primary care physician Yes  Home environment has been reviewed Home  Prior level of function: with Spouse  Prior/Current Home Services No current home services  Readmission risk has been reviewed (S)  Yes  Transition of care needs no transition of care needs at this time   IPCM offered HH, patient has no needs. His wife takes care of him. Discharging home today.   Inpatient Care Manager [facility]) has reviewed patient and no ICM needs have been identified at this time. We will continue to monitor patient advancement through interdisciplinary progression rounds. If new patient transition needs arise, please place a ICM consult.

## 2024-05-25 NOTE — Progress Notes (Signed)
 Erminio Cone NP notified of patients elevated heart rate and that no home meds have been ordered.

## 2024-05-25 NOTE — Discharge Summary (Signed)
 Physician Discharge Summary   Patient: Jose Joseph MRN: [id number] DOB: January 28, 1956  Admit date:     05/24/2024  Discharge date: {dischdate:26783}  Discharge Physician: Jose Joseph   PCP: Jose Carwin, MD   Recommendations at discharge:  {Tip this will not be part of the note when signed- Example include specific recommendations for outpatient follow-up, pending tests to follow-up on. (Optional):26781}  ***   You were admitted for COPD exacerbation precipitated by covid-19 infection, and possible superimposed bacterial pneumonia. You have maximized benefit of inpatient management and are able to safely discharge today.   - Complete the course of steroids with prednisone  for 4 more days and the course of antibiotics with augmentin  for 4 more days.  - Use the nebulizer to take albuterol  as needed (prescribed) - Use supplemental oxygen  as needed to maintain oxygen  saturation above 90%  - IMPORTANT: You have a follow up appointment with [facility] on 10/15. That will be exactly 5 days from your positive test for covid-19, so it's a gray area for whether you need to remain on isolation. Please call the office on Monday as they likely have a protocol to follow and can tell you whether you will need to reschedule.  - If your symptoms return/worsen, seek medical attention right away.  Discharge Diagnoses: Principal Problem:   COPD exacerbation (HCC) Active Problems:   Malignant neoplasm of lung (HCC)   Long term current use of anticoagulant   Lung cancer metastatic to bone [facility])   Hospital Course: No notes on file  Assessment and Plan: No notes have been filed under this hospital service. Service: Hospitalist     {Tip this will not be part of the note when signed Body mass index is 33.74 kg/m. , ,  (Optional):26781}  {(NOTE) Pain control PDMP Statment (Optional):26782} Consultants: *** Procedures performed: ***  Disposition: {Plan; Disposition:26390} Diet  recommendation:  {Diet_Plan:26776} DISCHARGE MEDICATION: Allergies as of 05/25/2024       Reactions   Bee Venom Anaphylaxis   syncope     Med Rec must be completed prior to using this [facility]***        Durable Medical Equipment  (From admission, onward)           Start     Ordered   05/25/24 1309  For home use only DME oxygen   Once       Question Answer Comment  Length of Need 6 Months   Mode or (Route) Nasal cannula   Liters per Minute 2   Frequency Continuous (stationary and portable oxygen  unit needed)   Oxygen  delivery system Gas      05/25/24 1308            Follow-up Information     Jose Carwin, MD Follow up.   Specialty: Internal Medicine Contact information: [address] [place] [address] [phone]                Discharge Exam: Jose Joseph   05/24/24 1010  Weight: 116 kg   ***  Condition at discharge: {DC Condition:26389}  The results of significant diagnostics from this hospitalization (including imaging, microbiology, ancillary and laboratory) are listed below for reference.   Imaging Studies: DG Chest Port 1 View Result Date: 05/24/2024 EXAM: 1 VIEW(S) XRAY OF THE CHEST 05/24/2024 10:39:00 AM COMPARISON: None available. 09/13/2023. CLINICAL HISTORY: Shortness of breath, progressively worse. History of COPD and lung cancer. Hypotension. FINDINGS: LINES, TUBES AND DEVICES: Stable right IJ port catheter to the  distal SVC. LUNGS AND PLEURA: Persistent poorly marginated coarse airspace opacities about the left hilum. Stable linear scarring or atelectasis in the right mid lung. No pulmonary edema. No pleural effusion. No pneumothorax. HEART AND MEDIASTINUM: No acute abnormality of the cardiac and mediastinal silhouettes. BONES AND SOFT TISSUES: Right shoulder degenerative joint disease. No acute osseous abnormality. IMPRESSION: 1. Persistent coarse airspace opacities around the left hilum. 2. Stable linear scarring or  atelectasis in the right mid lung. 3. Right shoulder degenerative joint disease. 4. Right IJ port catheter tip in the distal SVC. Electronically signed by: Jose Faes MD 05/24/2024 10:53 AM EDT RP Workstation: [id number]   DG CHEST PORT 1 VIEW Result Date: 05/13/2024 CLINICAL DATA:  Respiratory failure. EXAM: PORTABLE CHEST 1 VIEW COMPARISON:  Chest CT dated 05/09/2024. FINDINGS: Right-sided Port-A-Cath in similar position. Interval progression of left perihilar densities may represent developing infiltrate. Clinical correlation and follow-up to resolution recommended. Right mid lung field linear atelectasis/scarring. No pleural effusion or pneumothorax. Stable cardiac silhouette no acute osseous pathology. IMPRESSION: Interval progression of left perihilar densities may represent developing infiltrate. Electronically Signed   By: Jose Joseph M.D.   On: 05/13/2024 14:16   ECHO TEE Result Date: 05/10/2024    TRANSESOPHOGEAL ECHO REPORT   Patient Name:   Jose Joseph Date of Exam: 05/10/2024 Medical Rec #:  [id number]     Height:       73.0 in Accession #:    [id number]    Weight:       262.6 lb Date of Birth:  08-23-1955     BSA:          2.416 m Patient Age:    68 years      BP:           85/52 mmHg Patient Gender: M             HR:           139 bpm. Exam Location:  Inpatient Procedure: Transesophageal Echo, 3D Echo, Color Doppler and Cardiac Doppler            (Both Spectral and Color Flow Doppler were utilized during            procedure). Indications:     I48.91* Unspecified atrial fibrillation  History:         Patient has prior history of Echocardiogram examinations, most                  recent 05/01/2024. COPD, Arrythmias:Atrial Fibrillation; Risk                  Factors:Hypertension and Lung Cancer.  Sonographer:     Jose Joseph Referring Phys:  [id number] Jose Joseph Diagnosing Phys: Jose Joseph PROCEDURE: After discussion of the risks and benefits of a TEE, an informed  consent was obtained from the patient. The transesophogeal probe was passed without difficulty through the esophogus of the patient. Imaged were obtained with the patient in a left lateral decubitus position. Sedation performed by different physician. The patient was monitored while under deep sedation. Anesthestetic sedation was provided intravenously by Anesthesiology: 203mg  of Propofol , 50mg  of Lidocaine . The patient developed no complications during the procedure. An unsuccessful direct current cardioversion was performed at 360 joules with 3 attempts.  IMPRESSIONS  1. Left ventricular ejection fraction, by estimation, is 55 to 60%. The left ventricle has normal function. The left ventricle has no regional wall motion abnormalities.  2. Right ventricular systolic  function is normal. The right ventricular size is normal. Tricuspid regurgitation signal is inadequate for assessing PA pressure.  3. Left atrial size was mildly dilated. No left atrial/left atrial appendage thrombus was detected.  4. The mitral valve is normal in structure. Trivial mitral valve regurgitation. No evidence of mitral stenosis.  5. The aortic valve is tricuspid. Aortic valve regurgitation is trivial. No aortic stenosis is present.  6. No PFO or ASD by color doppler. FINDINGS  Left Ventricle: Left ventricular ejection fraction, by estimation, is 55 to 60%. The left ventricle has normal function. The left ventricle has no regional wall motion abnormalities. The left ventricular internal cavity size was normal in size. There is  no left ventricular hypertrophy. Right Ventricle: The right ventricular size is normal. No increase in right ventricular wall thickness. Right ventricular systolic function is normal. Tricuspid regurgitation signal is inadequate for assessing PA pressure. Left Atrium: Left atrial size was mildly dilated. No left atrial/left atrial appendage thrombus was detected. Right Atrium: Right atrial size was normal in size.  Pericardium: There is no evidence of pericardial effusion. Mitral Valve: The mitral valve is normal in structure. Trivial mitral valve regurgitation. No evidence of mitral valve stenosis. Tricuspid Valve: The tricuspid valve is normal in structure. Tricuspid valve regurgitation is trivial. Aortic Valve: The aortic valve is tricuspid. Aortic valve regurgitation is trivial. No aortic stenosis is present. Pulmonic Valve: The pulmonic valve was grossly normal. Pulmonic valve regurgitation is not visualized. Aorta: The aortic root and ascending aorta are structurally normal, with no evidence of dilitation. IAS/Shunts: No PFO or ASD by color doppler. Additional Comments: Spectral Doppler performed. Dalton [employer]el Electronically signed by Jose Joseph Signature Date/Time: 05/10/2024/6:13:20 PM    Final    EP STUDY Result Date: 05/10/2024 See surgical note for result.  DG Chest Port 1 View Result Date: 05/09/2024 EXAM: 1 VIEW(S) XRAY OF THE CHEST 05/09/2024 05:02:00 AM COMPARISON: 05/01/2024 CLINICAL HISTORY: Tachycardia, chest pain radiates down LT arm region, profuse sweating, severe pain from 4 up to 9, slight dizziness FINDINGS: LINES, TUBES AND DEVICES: Right Port-A-Cath in place with tip overlying midportion of superior vena cava, unchanged. LUNGS AND PLEURA: Low lung volume. Redemonstration of linear atelectasis/scarring overlying right mid lung zone. Additional linear atelectasis/scarring in left lung. No pulmonary edema. No pleural effusion. No pneumothorax. HEART AND MEDIASTINUM: No acute abnormality of the cardiac and mediastinal silhouettes. BONES AND SOFT TISSUES: No acute osseous abnormality. IMPRESSION: 1. No acute findings. 2. Low lung volume with linear atelectasis/scarring in the right mid lung zone and left lung. Electronically signed by: Evalene Coho MD 05/09/2024 05:34 AM EDT RP Workstation: [id number]   ECHOCARDIOGRAM COMPLETE Result Date: 05/01/2024    ECHOCARDIOGRAM REPORT   Patient  Name:   NAS WAFER Date of Exam: 05/01/2024 Medical Rec #:  [id number]     Height:       73.0 in Accession #:    [id number]    Weight:       262.0 lb Date of Birth:  September 10, 1955     BSA:          2.413 m Patient Age:    68 years      BP:           98/64 mmHg Patient Gender: M             HR:           92 bpm. Exam Location:  [facility] Procedure: 2D Echo, Cardiac Doppler, Color Doppler and  Intracardiac            Opacification Agent (Both Spectral and Color Flow Doppler were            utilized during procedure). STAT ECHO Indications:    Atrial Fibrillation l48.91  History:        Patient has prior history of Echocardiogram examinations, most                 recent 04/11/2024. Stroke, Arrythmias:Atrial Fibrillation; Risk                 Factors:Former Smoker and Hypertension. Lung cancer metastatic                 to bone [facility]),                 Chronic obstructive pulmonary disease (HCC).  Sonographer:    Aida Pizza RCS Referring Phys: [phone] TRACI R TURNER IMPRESSIONS  1. Left ventricular ejection fraction, by estimation, is 55 to 60%. The left ventricle has normal function. The left ventricle has no regional wall motion abnormalities. Left ventricular diastolic parameters are consistent with Grade I diastolic dysfunction (impaired relaxation).  2. Right ventricular systolic function is normal. The right ventricular size is normal.  3. The mitral valve is normal in structure. No evidence of mitral valve regurgitation. No evidence of mitral stenosis.  4. The aortic valve is tricuspid. There is mild calcification of the aortic valve. There is mild thickening of the aortic valve. Aortic valve regurgitation is not visualized. Aortic valve sclerosis/calcification is present, without any evidence of aortic stenosis. FINDINGS  Left Ventricle: Left ventricular ejection fraction, by estimation, is 55 to 60%. The left ventricle has normal function. The left ventricle has no regional wall motion abnormalities. The left  ventricular internal cavity size was normal in size. There is  no left ventricular hypertrophy. Left ventricular diastolic parameters are consistent with Grade I diastolic dysfunction (impaired relaxation). Normal left ventricular filling pressure. Right Ventricle: The right ventricular size is normal. No increase in right ventricular wall thickness. Right ventricular systolic function is normal. Left Atrium: Left atrial size was normal in size. Right Atrium: Right atrial size was normal in size. Pericardium: There is no evidence of pericardial effusion. Mitral Valve: The mitral valve is normal in structure. No evidence of mitral valve regurgitation. No evidence of mitral valve stenosis. Tricuspid Valve: The tricuspid valve is normal in structure. Tricuspid valve regurgitation is not demonstrated. No evidence of tricuspid stenosis. Aortic Valve: The aortic valve is tricuspid. There is mild calcification of the aortic valve. There is mild thickening of the aortic valve. Aortic valve regurgitation is not visualized. Aortic valve sclerosis/calcification is present, without any evidence of aortic stenosis. Pulmonic Valve: The pulmonic valve was normal in structure. Pulmonic valve regurgitation is not visualized. No evidence of pulmonic stenosis. Aorta: The aortic root is normal in size and structure. IAS/Shunts: The interatrial septum appears to be lipomatous. No atrial level shunt detected by color flow Doppler.  LEFT VENTRICLE PLAX 2D LVIDd:         5.10 cm   Diastology LVIDs:         3.50 cm   LV e' medial:    7.96 cm/s LV PW:         1.00 cm   LV E/e' medial:  5.8 LV IVS:        0.90 cm   LV e' lateral:   11.30 cm/s LVOT diam:  2.10 cm   LV E/e' lateral: 4.1 LV SV:         59 LV SV Index:   24 LVOT Area:     3.46 cm  RIGHT VENTRICLE RV S prime:     13.40 cm/s TAPSE (M-mode): 2.3 cm LEFT ATRIUM             Index        RIGHT ATRIUM           Index LA diam:        3.70 cm 1.53 cm/m   RA Area:     16.00 cm LA Vol  (A2C):   71.3 ml 29.54 ml/m  RA Volume:   41.60 ml  17.24 ml/m LA Vol (A4C):   51.5 ml 21.34 ml/m LA Biplane Vol: 61.1 ml 25.32 ml/m  AORTIC VALVE LVOT Vmax:   97.33 cm/s LVOT Vmean:  63.433 cm/s LVOT VTI:    0.169 m  AORTA Ao Root diam: 3.70 cm MITRAL VALVE MV Area (PHT): 4.49 cm    SHUNTS MV Decel Time: 169 msec    Systemic VTI:  0.17 m MV E velocity: 46.30 cm/s  Systemic Diam: 2.10 cm MV A velocity: 66.90 cm/s MV E/A ratio:  0.69 Wilbert Bihari MD Electronically signed by Wilbert Bihari MD Signature Date/Time: 05/01/2024/2:07:26 PM    Final    DG Chest Portable 1 View Result Date: 05/01/2024 CLINICAL DATA:  Sepsis. EXAM: PORTABLE CHEST 1 VIEW COMPARISON:  12/28/2023 FINDINGS: Low volume film. Cardiopericardial silhouette is at upper limits of normal for size. Focal opacity in the parahilar right lung has decreased in the interval with residual streaky linear density in this region, compatible with scarring as indicated on chest CT 03/11/2024. Right Port-A-Cath again noted. No acute bony abnormality. Telemetry leads overlie the chest. IMPRESSION: Low volume film without acute cardiopulmonary findings. Electronically Signed   By: Camellia Candle M.D.   On: 05/01/2024 11:26    Microbiology: Results for orders placed or performed during the hospital encounter of 05/24/24  Resp panel by RT-PCR (RSV, Flu A&B, Covid) Anterior Nasal Swab     Status: Abnormal   Collection Time: 05/24/24 10:56 AM   Specimen: Anterior Nasal Swab  Result Value Ref Range Status   SARS Coronavirus 2 by RT PCR POSITIVE (A) NEGATIVE Final    Comment: (NOTE) SARS-CoV-2 target nucleic acids are DETECTED.  The SARS-CoV-2 RNA is generally detectable in upper respiratory specimens during the acute phase of infection. Positive results are indicative of the presence of the identified virus, but do not rule out bacterial infection or co-infection with other pathogens not detected by the test. Clinical correlation with patient history  and other diagnostic information is necessary to determine patient infection status. The expected result is Negative.  Fact Sheet for Patients: BloggerCourse.com  Fact Sheet for Healthcare Providers: SeriousBroker.it  This test is not yet approved or cleared by the [place]  FDA and  has been authorized for detection and/or diagnosis of SARS-CoV-2 by FDA under an Emergency Use Authorization (EUA).  This EUA will remain in effect (meaning this test can be used) for the duration of  the COVID-19 declaration under Section 564(b)(1) of the A ct, 21 U.S.C. section 360bbb-3(b)(1), unless the authorization is terminated or revoked sooner.     Influenza A by PCR NEGATIVE NEGATIVE Final   Influenza B by PCR NEGATIVE NEGATIVE Final    Comment: (NOTE) The Xpert Xpress SARS-CoV-2/FLU/RSV plus assay is intended as an aid in the  diagnosis of influenza from Nasopharyngeal swab specimens and should not be used as a sole basis for treatment. Nasal washings and aspirates are unacceptable for Xpert Xpress SARS-CoV-2/FLU/RSV testing.  Fact Sheet for Patients: BloggerCourse.com  Fact Sheet for Healthcare Providers: SeriousBroker.it  This test is not yet approved or cleared by the [place]  FDA and has been authorized for detection and/or diagnosis of SARS-CoV-2 by FDA under an Emergency Use Authorization (EUA). This EUA will remain in effect (meaning this test can be used) for the duration of the COVID-19 declaration under Section 564(b)(1) of the Act, 21 U.S.C. section 360bbb-3(b)(1), unless the authorization is terminated or revoked.     Resp Syncytial Virus by PCR NEGATIVE NEGATIVE Final    Comment: (NOTE) Fact Sheet for Patients: BloggerCourse.com  Fact Sheet for Healthcare Providers: SeriousBroker.it  This test is not yet  approved or cleared by the [place]  FDA and has been authorized for detection and/or diagnosis of SARS-CoV-2 by FDA under an Emergency Use Authorization (EUA). This EUA will remain in effect (meaning this test can be used) for the duration of the COVID-19 declaration under Section 564(b)(1) of the Act, 21 U.S.C. section 360bbb-3(b)(1), unless the authorization is terminated or revoked.  Performed at [facility], [address] [place], [place] [address]     Labs: CBC: Recent Labs  Lab 05/24/24 1026 05/25/24 0416  WBC 23.0* 21.6*  NEUTROABS 17.3*  --   HGB 12.5* 11.6*  HCT 40.0 37.9*  MCV 90.5 92.2  PLT 229 197   Basic Metabolic Panel: Recent Labs  Lab 05/24/24 1026 05/25/24 0416  NA 141 140  K 4.0 3.7  CL 102 104  CO2 24 25  GLUCOSE 112* 107*  BUN 41* 37*  CREATININE 1.40* 1.24  CALCIUM 9.6 9.6   Liver Function Tests: Recent Labs  Lab 05/24/24 1026 05/25/24 0416  AST 33 25  ALT 19 17  ALKPHOS 135* 119  BILITOT 0.3 <0.2  PROT 6.7 6.4*  ALBUMIN  3.1* 3.1*   CBG: No results for input(s): GLUCAP in the last 168 hours.  Discharge time spent: {LESS THAN/GREATER UYJW:73611} 30 minutes.  Signed: Bernardino KATHEE Come, MD [facility] 05/25/2024

## 2024-05-25 NOTE — Plan of Care (Signed)

## 2024-05-25 NOTE — TOC Transition Note (Signed)
 Transition of Care [facility]) - Discharge Note   Patient Details  Name: Jose Joseph MRN: [id number] Date of Birth: [birth year]  Transition of Care [facility]) CM/SW Contact:  Sharlyne Stabs, RN Phone Number: 05/25/2024, 2:19 PM   Clinical Narrative:   Patient discharging home, did not qualify for home oxygen , MD ordering neb machine.  CMS choices reviewed,  Referral given to Jasmine, [employer] is behind on deliveries today. CM delivered to RN from [place], delivery ticket completed to give to Zach H. On Monday.  MD update, patient wanting to discharge home COVID +. Wife here to transport.     Final next level of care: Home/Self Care Barriers to Discharge: Barriers Resolved   Patient Goals and CMS Choice Patient states their goals for this hospitalization and ongoing recovery are:: return home          Discharge Placement                  Name of family member notified: wife Patient and family notified of of transfer: 05/25/24  Discharge Plan and Services Additional resources added to the After Visit Summary for                  DME Arranged: Nebulizer machine DME Agency: [employer] Date DME Agency Contacted: 05/25/24 Time DME Agency Contacted: [id number] Representative spoke with at DME Agency: [place]            Social Drivers of Health (SDOH) Interventions SDOH Screenings   Food Insecurity: No Food Insecurity (05/24/2024)  Housing: Low Risk  (05/24/2024)  Transportation Needs: No Transportation Needs (05/24/2024)  Utilities: Not At Risk (05/24/2024)  Depression (PHQ2-9): Low Risk  (05/15/2024)  Social Connections: Moderately Isolated (05/24/2024)  Tobacco Use: Medium Risk (05/24/2024)     Readmission Risk Interventions    05/03/2024   10:26 AM  Readmission Risk Prevention Plan  Transportation Screening Complete  HRI or Home Care Consult Complete  Social Work Consult for Recovery Care Planning/Counseling Complete  Palliative Care Screening Not Applicable   Medication Review [employer]) Complete

## 2024-05-27 ENCOUNTER — Encounter

## 2024-05-28 ENCOUNTER — Inpatient Hospital Stay (HOSPITAL_COMMUNITY)
Admission: EM | Admit: 2024-05-28 | Discharge: 2024-06-15 | DRG: 871 | Disposition: E | Attending: Hospitalist | Admitting: Hospitalist

## 2024-05-28 ENCOUNTER — Encounter (HOSPITAL_COMMUNITY): Payer: Self-pay | Admitting: Family Medicine

## 2024-05-28 ENCOUNTER — Other Ambulatory Visit: Payer: Self-pay

## 2024-05-28 ENCOUNTER — Emergency Department (HOSPITAL_COMMUNITY)

## 2024-05-28 DIAGNOSIS — Z87891 Personal history of nicotine dependence: Secondary | ICD-10-CM | POA: Diagnosis not present

## 2024-05-28 DIAGNOSIS — U071 COVID-19: Secondary | ICD-10-CM | POA: Diagnosis present

## 2024-05-28 DIAGNOSIS — J449 Chronic obstructive pulmonary disease, unspecified: Secondary | ICD-10-CM | POA: Diagnosis present

## 2024-05-28 DIAGNOSIS — I959 Hypotension, unspecified: Secondary | ICD-10-CM | POA: Diagnosis present

## 2024-05-28 DIAGNOSIS — R0602 Shortness of breath: Secondary | ICD-10-CM | POA: Diagnosis present

## 2024-05-28 DIAGNOSIS — D6959 Other secondary thrombocytopenia: Secondary | ICD-10-CM | POA: Diagnosis present

## 2024-05-28 DIAGNOSIS — C3491 Malignant neoplasm of unspecified part of right bronchus or lung: Secondary | ICD-10-CM | POA: Diagnosis present

## 2024-05-28 DIAGNOSIS — N179 Acute kidney failure, unspecified: Secondary | ICD-10-CM | POA: Diagnosis present

## 2024-05-28 DIAGNOSIS — A419 Sepsis, unspecified organism: Secondary | ICD-10-CM | POA: Diagnosis present

## 2024-05-28 DIAGNOSIS — I110 Hypertensive heart disease with heart failure: Secondary | ICD-10-CM | POA: Diagnosis present

## 2024-05-28 DIAGNOSIS — Z7901 Long term (current) use of anticoagulants: Secondary | ICD-10-CM | POA: Diagnosis not present

## 2024-05-28 DIAGNOSIS — I5033 Acute on chronic diastolic (congestive) heart failure: Secondary | ICD-10-CM | POA: Diagnosis present

## 2024-05-28 DIAGNOSIS — C7951 Secondary malignant neoplasm of bone: Secondary | ICD-10-CM | POA: Diagnosis present

## 2024-05-28 DIAGNOSIS — Z8673 Personal history of transient ischemic attack (TIA), and cerebral infarction without residual deficits: Secondary | ICD-10-CM

## 2024-05-28 DIAGNOSIS — I2489 Other forms of acute ischemic heart disease: Secondary | ICD-10-CM | POA: Diagnosis present

## 2024-05-28 DIAGNOSIS — Z8051 Family history of malignant neoplasm of kidney: Secondary | ICD-10-CM

## 2024-05-28 DIAGNOSIS — Z7952 Long term (current) use of systemic steroids: Secondary | ICD-10-CM

## 2024-05-28 DIAGNOSIS — D849 Immunodeficiency, unspecified: Secondary | ICD-10-CM | POA: Diagnosis present

## 2024-05-28 DIAGNOSIS — Z91030 Bee allergy status: Secondary | ICD-10-CM

## 2024-05-28 DIAGNOSIS — I480 Paroxysmal atrial fibrillation: Secondary | ICD-10-CM | POA: Diagnosis present

## 2024-05-28 DIAGNOSIS — Z7951 Long term (current) use of inhaled steroids: Secondary | ICD-10-CM

## 2024-05-28 DIAGNOSIS — Z8616 Personal history of COVID-19: Secondary | ICD-10-CM | POA: Diagnosis not present

## 2024-05-28 DIAGNOSIS — Z515 Encounter for palliative care: Secondary | ICD-10-CM

## 2024-05-28 DIAGNOSIS — Z79899 Other long term (current) drug therapy: Secondary | ICD-10-CM

## 2024-05-28 DIAGNOSIS — C3490 Malignant neoplasm of unspecified part of unspecified bronchus or lung: Secondary | ICD-10-CM | POA: Diagnosis present

## 2024-05-28 DIAGNOSIS — Z8042 Family history of malignant neoplasm of prostate: Secondary | ICD-10-CM

## 2024-05-28 DIAGNOSIS — R6520 Severe sepsis without septic shock: Secondary | ICD-10-CM | POA: Diagnosis present

## 2024-05-28 DIAGNOSIS — Z96642 Presence of left artificial hip joint: Secondary | ICD-10-CM | POA: Diagnosis present

## 2024-05-28 DIAGNOSIS — Z923 Personal history of irradiation: Secondary | ICD-10-CM

## 2024-05-28 DIAGNOSIS — J441 Chronic obstructive pulmonary disease with (acute) exacerbation: Secondary | ICD-10-CM | POA: Diagnosis present

## 2024-05-28 DIAGNOSIS — I5032 Chronic diastolic (congestive) heart failure: Secondary | ICD-10-CM | POA: Diagnosis not present

## 2024-05-28 DIAGNOSIS — D649 Anemia, unspecified: Secondary | ICD-10-CM | POA: Diagnosis present

## 2024-05-28 DIAGNOSIS — J9601 Acute respiratory failure with hypoxia: Secondary | ICD-10-CM | POA: Diagnosis present

## 2024-05-28 DIAGNOSIS — J189 Pneumonia, unspecified organism: Secondary | ICD-10-CM | POA: Diagnosis present

## 2024-05-28 DIAGNOSIS — G893 Neoplasm related pain (acute) (chronic): Secondary | ICD-10-CM | POA: Diagnosis present

## 2024-05-28 DIAGNOSIS — Z66 Do not resuscitate: Secondary | ICD-10-CM | POA: Diagnosis present

## 2024-05-28 DIAGNOSIS — I4891 Unspecified atrial fibrillation: Secondary | ICD-10-CM | POA: Diagnosis present

## 2024-05-28 LAB — URINALYSIS, W/ REFLEX TO CULTURE (INFECTION SUSPECTED)
Bilirubin Urine: NEGATIVE
Glucose, UA: 50 mg/dL — AB
Ketones, ur: NEGATIVE mg/dL
Nitrite: NEGATIVE
Protein, ur: 30 mg/dL — AB
Specific Gravity, Urine: 1.028 (ref 1.005–1.030)
pH: 5 (ref 5.0–8.0)

## 2024-05-28 LAB — BASIC METABOLIC PANEL WITH GFR
Anion gap: 14 (ref 5–15)
BUN: 41 mg/dL — ABNORMAL HIGH (ref 8–23)
CO2: 23 mmol/L (ref 22–32)
Calcium: 10.2 mg/dL (ref 8.9–10.3)
Chloride: 103 mmol/L (ref 98–111)
Creatinine, Ser: 1.26 mg/dL — ABNORMAL HIGH (ref 0.61–1.24)
GFR, Estimated: >60 mL/min (ref >60)
Glucose, Bld: 120 mg/dL — ABNORMAL HIGH (ref 70–99)
Potassium: 3.9 mmol/L (ref 3.5–5.1)
Sodium: 140 mmol/L (ref 135–145)

## 2024-05-28 LAB — RESP PANEL BY RT-PCR (RSV, FLU A&B, COVID)  RVPGX2
Influenza A by PCR: NEGATIVE
Influenza B by PCR: NEGATIVE
Resp Syncytial Virus by PCR: NEGATIVE
SARS Coronavirus 2 by RT PCR: POSITIVE — AB

## 2024-05-28 LAB — CBC WITH DIFFERENTIAL/PLATELET
Abs Immature Granulocytes: 0.95 K/uL — ABNORMAL HIGH (ref 0.00–0.07)
Basophils Absolute: 0.1 K/uL (ref 0.0–0.1)
Basophils Relative: 0 %
Eosinophils Absolute: 1.2 K/uL — ABNORMAL HIGH (ref 0.0–0.5)
Eosinophils Relative: 4 %
HCT: 43 % (ref 39.0–52.0)
Hemoglobin: 13.4 g/dL (ref 13.0–17.0)
Immature Granulocytes: 3 %
Lymphocytes Relative: 5 %
Lymphs Abs: 1.5 K/uL (ref 0.7–4.0)
MCH: 28.8 pg (ref 26.0–34.0)
MCHC: 31.2 g/dL (ref 30.0–36.0)
MCV: 92.5 fL (ref 80.0–100.0)
Monocytes Absolute: 3 K/uL — ABNORMAL HIGH (ref 0.1–1.0)
Monocytes Relative: 10 %
Neutro Abs: 24 K/uL — ABNORMAL HIGH (ref 1.7–7.7)
Neutrophils Relative %: 78 %
Platelets: 142 K/uL — ABNORMAL LOW (ref 150–400)
RBC: 4.65 MIL/uL (ref 4.22–5.81)
RDW: 14.6 % (ref 11.5–15.5)
Smear Review: NORMAL
WBC: 30.7 K/uL — ABNORMAL HIGH (ref 4.0–10.5)
nRBC: 0.1 % (ref 0.0–0.2)

## 2024-05-28 LAB — BLOOD GAS, VENOUS
Acid-Base Excess: 1.5 mmol/L (ref 0.0–2.0)
Bicarbonate: 27.2 mmol/L (ref 20.0–28.0)
Drawn by: 51519
O2 Saturation: 79.3 %
Patient temperature: 36.5
pCO2, Ven: 45 mmHg (ref 44–60)
pH, Ven: 7.39 (ref 7.25–7.43)
pO2, Ven: 44 mmHg (ref 32–45)

## 2024-05-28 LAB — HEPATIC FUNCTION PANEL
ALT: 23 U/L (ref 0–44)
AST: 31 U/L (ref 15–41)
Albumin: 3 g/dL — ABNORMAL LOW (ref 3.5–5.0)
Alkaline Phosphatase: 143 U/L — ABNORMAL HIGH (ref 38–126)
Bilirubin, Direct: 0.2 mg/dL (ref 0.0–0.2)
Indirect Bilirubin: 0.1 mg/dL — ABNORMAL LOW (ref 0.3–0.9)
Total Bilirubin: 0.2 mg/dL (ref 0.0–1.2)
Total Protein: 6.5 g/dL (ref 6.5–8.1)

## 2024-05-28 LAB — PROTIME-INR
INR: 3 — ABNORMAL HIGH (ref 0.8–1.2)
Prothrombin Time: 32.3 s — ABNORMAL HIGH (ref 11.4–15.2)

## 2024-05-28 LAB — MRSA NEXT GEN BY PCR, NASAL: MRSA by PCR Next Gen: NOT DETECTED

## 2024-05-28 LAB — TROPONIN T, HIGH SENSITIVITY
Troponin T High Sensitivity: 20 ng/L — ABNORMAL HIGH (ref 0–19)
Troponin T High Sensitivity: 20 ng/L — ABNORMAL HIGH (ref 0–19)

## 2024-05-28 LAB — LACTIC ACID, PLASMA
Lactic Acid, Venous: 2.7 mmol/L (ref 0.5–1.9)
Lactic Acid, Venous: 2.7 mmol/L (ref 0.5–1.9)

## 2024-05-28 LAB — CBG MONITORING, ED: Glucose-Capillary: 118 mg/dL — ABNORMAL HIGH (ref 70–99)

## 2024-05-28 LAB — STREP PNEUMONIAE URINARY ANTIGEN: Strep Pneumo Urinary Antigen: NEGATIVE

## 2024-05-28 LAB — PRO BRAIN NATRIURETIC PEPTIDE: Pro Brain Natriuretic Peptide: 4264 pg/mL — ABNORMAL HIGH (ref <300.0)

## 2024-05-28 MED ORDER — AMIODARONE HCL IN DEXTROSE 360-4.14 MG/200ML-% IV SOLN
30.0000 mg/h | INTRAVENOUS | Status: DC
  Administered 2024-05-28 – 2024-05-31 (×7): 30 mg/h via INTRAVENOUS
  Filled 2024-05-28 (×6): qty 200

## 2024-05-28 MED ORDER — VANCOMYCIN HCL IN DEXTROSE 1-5 GM/200ML-% IV SOLN: 1000.0000 mg | Freq: Once | INTRAVENOUS | Status: DC

## 2024-05-28 MED ORDER — WARFARIN SODIUM 1 MG PO TABS
1.0000 mg | ORAL_TABLET | Freq: Once | ORAL | Status: AC
  Administered 2024-05-28: 1 mg via ORAL
  Filled 2024-05-28: qty 1

## 2024-05-28 MED ORDER — ACETAMINOPHEN 325 MG PO TABS: 650.0000 mg | ORAL_TABLET | Freq: Four times a day (QID) | ORAL | Status: DC | PRN

## 2024-05-28 MED ORDER — LEVALBUTEROL HCL 0.63 MG/3ML IN NEBU
0.6300 mg | INHALATION_SOLUTION | Freq: Four times a day (QID) | RESPIRATORY_TRACT | Status: DC
  Administered 2024-05-28 – 2024-05-31 (×11): 0.63 mg via RESPIRATORY_TRACT
  Filled 2024-05-28 (×17): qty 3

## 2024-05-28 MED ORDER — ALBUTEROL SULFATE (2.5 MG/3ML) 0.083% IN NEBU: 10.0000 mg | INHALATION_SOLUTION | Freq: Once | RESPIRATORY_TRACT | Status: DC

## 2024-05-28 MED ORDER — HYDROCODONE-ACETAMINOPHEN 5-325 MG PO TABS
1.0000 | ORAL_TABLET | Freq: Four times a day (QID) | ORAL | Status: AC | PRN
Start: 2024-05-28 — End: ?

## 2024-05-28 MED ORDER — LACTATED RINGERS IV SOLN: INTRAVENOUS | Status: DC

## 2024-05-28 MED ORDER — CHLORHEXIDINE GLUCONATE CLOTH 2 % EX PADS
6.0000 | MEDICATED_PAD | Freq: Every day | CUTANEOUS | Status: DC
  Administered 2024-05-29 – 2024-05-31 (×3): 6 via TOPICAL

## 2024-05-28 MED ORDER — TAMSULOSIN HCL 0.4 MG PO CAPS
0.4000 mg | ORAL_CAPSULE | Freq: Every day | ORAL | Status: DC
  Administered 2024-05-28 – 2024-05-30 (×3): 0.4 mg via ORAL
  Filled 2024-05-28 (×3): qty 1

## 2024-05-28 MED ORDER — WARFARIN - PHARMACIST DOSING INPATIENT: Freq: Every day | Status: DC

## 2024-05-28 MED ORDER — LACTATED RINGERS IV BOLUS (SEPSIS)
1000.0000 mL | Freq: Once | INTRAVENOUS | Status: AC
  Administered 2024-05-28: 1000 mL via INTRAVENOUS

## 2024-05-28 MED ORDER — LEVALBUTEROL HCL 1.25 MG/0.5ML IN NEBU
1.2500 mg | INHALATION_SOLUTION | Freq: Once | RESPIRATORY_TRACT | Status: AC
  Administered 2024-05-28: 1.25 mg via RESPIRATORY_TRACT
  Filled 2024-05-28: qty 0.5

## 2024-05-28 MED ORDER — IPRATROPIUM BROMIDE 0.02 % IN SOLN
0.5000 mg | Freq: Four times a day (QID) | RESPIRATORY_TRACT | Status: DC
  Administered 2024-05-28 – 2024-05-31 (×11): 0.5 mg via RESPIRATORY_TRACT
  Filled 2024-05-28 (×11): qty 2.5

## 2024-05-28 MED ORDER — METOPROLOL TARTRATE 5 MG/5ML IV SOLN
5.0000 mg | INTRAVENOUS | Status: DC | PRN
  Administered 2024-05-28: 5 mg via INTRAVENOUS
  Filled 2024-05-28: qty 5

## 2024-05-28 MED ORDER — SODIUM CHLORIDE 0.9 % IV SOLN
2.0000 g | Freq: Once | INTRAVENOUS | Status: AC
  Administered 2024-05-28: 2 g via INTRAVENOUS
  Filled 2024-05-28: qty 12.5

## 2024-05-28 MED ORDER — VANCOMYCIN HCL 1500 MG/300ML IV SOLN
1500.0000 mg | INTRAVENOUS | Status: DC
  Administered 2024-05-29 – 2024-05-31 (×3): 1500 mg via INTRAVENOUS
  Filled 2024-05-28 (×3): qty 300

## 2024-05-28 MED ORDER — AMIODARONE HCL IN DEXTROSE 360-4.14 MG/200ML-% IV SOLN
60.0000 mg/h | INTRAVENOUS | Status: AC
  Administered 2024-05-28 (×2): 60 mg/h via INTRAVENOUS
  Filled 2024-05-28 (×2): qty 200

## 2024-05-28 MED ORDER — SODIUM CHLORIDE 0.9 % IV SOLN
2.0000 g | Freq: Three times a day (TID) | INTRAVENOUS | Status: DC
  Administered 2024-05-28 – 2024-05-31 (×8): 2 g via INTRAVENOUS
  Filled 2024-05-28 (×8): qty 12.5

## 2024-05-28 MED ORDER — ACETAMINOPHEN 650 MG RE SUPP: 650.0000 mg | Freq: Four times a day (QID) | RECTAL | Status: DC | PRN

## 2024-05-28 MED ORDER — METHYLPREDNISOLONE SODIUM SUCC 40 MG IJ SOLR
40.0000 mg | Freq: Two times a day (BID) | INTRAMUSCULAR | Status: DC
  Administered 2024-05-28 – 2024-05-31 (×6): 40 mg via INTRAVENOUS
  Filled 2024-05-28 (×6): qty 1

## 2024-05-28 MED ORDER — AMIODARONE LOAD VIA INFUSION
150.0000 mg | Freq: Once | INTRAVENOUS | Status: AC
  Administered 2024-05-28: 150 mg via INTRAVENOUS
  Filled 2024-05-28: qty 83.34

## 2024-05-28 MED ORDER — VANCOMYCIN HCL 2000 MG/400ML IV SOLN
2000.0000 mg | Freq: Once | INTRAVENOUS | Status: AC
  Administered 2024-05-28: 2000 mg via INTRAVENOUS
  Filled 2024-05-28: qty 400

## 2024-05-28 MED ORDER — SODIUM CHLORIDE 0.9% FLUSH
3.0000 mL | Freq: Two times a day (BID) | INTRAVENOUS | Status: DC
  Administered 2024-05-28 – 2024-05-31 (×6): 3 mL via INTRAVENOUS

## 2024-05-28 MED ORDER — LEVALBUTEROL HCL 0.63 MG/3ML IN NEBU
0.6300 mg | INHALATION_SOLUTION | Freq: Four times a day (QID) | RESPIRATORY_TRACT | Status: DC | PRN
  Administered 2024-05-31: 0.63 mg via RESPIRATORY_TRACT
  Filled 2024-05-28: qty 3

## 2024-05-28 NOTE — H&P (Signed)
 History and Physical    Patient: Jose Joseph FMW:[id number] DOB: November 20, 1955 DOA: 05/28/2024 DOS: the patient was seen and examined on 05/28/2024 PCP: Sheryle Carwin, MD  Patient coming from: Home  Chief Complaint:  Chief Complaint  Patient presents with   Respiratory Distress   HPI: Jose Joseph is a 68 y.o. male with medical history significant of metastatic lung CA s/p XRT on oral therapy, PAF s/p unsuccessful DCCV 9/26 (though later converted on IV amio), on coumadin , COPD, recent admission for covid-19 with question of superimposed pneumonia, discharged 10/11 with augmentin , nebulizer, did not qualify for supplemental oxygen . He returned to the ED this morning due to respiratory distress. He was found to be hypoxic by EMS, given supplemental oxygen , escalating to NRB, and in the ED required BiPAP. Solumedrol and continuous nebs given. Also given diltiazem  for AFib with RVR with no appreciable improvement in rate.   History is limited during encounter by patient's respiratory distress.  Review of Systems: As mentioned in the history of present illness. All other systems reviewed and are negative. Past Medical History:  Diagnosis Date   Arthritis    Asthma    Atrial fibrillation (HCC)    Cancer (HCC)    Complication of anesthesia    after his neck surgery pt. 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)    Past Surgical History:  Procedure Laterality Date   BRONCHIAL BIOPSY  11/21/2023   Procedure: BRONCHOSCOPY, WITH BIOPSY;  Surgeon: Shelah Lamar RAMAN, MD;  Location: [facility] ENDOSCOPY;  Service: Pulmonary;;   BRONCHIAL NEEDLE ASPIRATION  BIOPSY  11/21/2023   Procedure: BRONCHOSCOPY, WITH NEEDLE ASPIRATION BIOPSY;  Surgeon: Shelah Lamar RAMAN, MD;  Location: [facility] ENDOSCOPY;  Service: Pulmonary;;   CARDIOVERSION N/A 01/01/2024   Procedure: CARDIOVERSION;  Surgeon: Francyne Headland, MD;  Location: [facility] INVASIVE CV LAB;  Service: Cardiovascular;  Laterality: N/A;   CARDIOVERSION N/A 05/10/2024   Procedure: CARDIOVERSION;  Surgeon: Rolan Ezra RAMAN, MD;  Location: [facility] INVASIVE CV LAB;  Service: Cardiovascular;  Laterality: N/A;   COLONOSCOPY WITH PROPOFOL  N/A 01/19/2023   Procedure: COLONOSCOPY WITH PROPOFOL ;  Surgeon: Shaaron Lamar HERO, MD;  Location: [facility] ENDO SUITE;  Service: Endoscopy;  Laterality: N/A;  200pm, asa 2   ENDOBRONCHIAL ULTRASOUND Bilateral 11/21/2023   Procedure: ENDOBRONCHIAL ULTRASOUND (EBUS);  Surgeon: Shelah Lamar RAMAN, MD;  Location: [facility] ENDOSCOPY;  Service: Pulmonary;  Laterality: Bilateral;   HERNIA REPAIR     IR IMAGING GUIDED PORT INSERTION  12/14/2023   IR THORACENTESIS ASP PLEURAL SPACE W/IMG GUIDE  12/28/2023   KNEE ARTHROCENTESIS     NECK SURGERY     POLYPECTOMY  01/19/2023   Procedure: POLYPECTOMY;  Surgeon: Shaaron Lamar HERO, MD;  Location: [facility] ENDO SUITE;  Service: Endoscopy;;   TOTAL HIP ARTHROPLASTY Left 01/15/2021   Procedure: LEFT TOTAL HIP ARTHROPLASTY ANTERIOR APPROACH;  Surgeon: Barbarann Oneil BROCKS, MD;  Location: [facility] OR;  Service: Orthopedics;  Laterality: Left;   TRANSESOPHAGEAL ECHOCARDIOGRAM (CATH LAB) N/A 01/01/2024   Procedure: TRANSESOPHAGEAL ECHOCARDIOGRAM;  Surgeon: Francyne Headland, MD;  Location: [facility] INVASIVE CV LAB;  Service: Cardiovascular;  Laterality: N/A;  TRANSESOPHAGEAL ECHOCARDIOGRAM (CATH LAB) N/A 05/10/2024   Procedure: TRANSESOPHAGEAL ECHOCARDIOGRAM;  Surgeon: Rolan Ezra RAMAN, MD;  Location: [facility] INVASIVE CV LAB;  Service: Cardiovascular;  Laterality: N/A;   VIDEO BRONCHOSCOPY WITH ENDOBRONCHIAL NAVIGATION Bilateral 11/21/2023   Procedure: VIDEO BRONCHOSCOPY WITH ENDOBRONCHIAL NAVIGATION;  Surgeon:  Shelah Lamar RAMAN, MD;  Location: [facility] ENDOSCOPY;  Service: Pulmonary;  Laterality: Bilateral;   WRIST ARTHROPLASTY     Social History:  reports that he quit smoking about 11 years ago. His smoking use included cigarettes. He started smoking about 46 years ago. He has a 35 pack-year smoking history. He has been exposed to tobacco smoke. He has never used smokeless tobacco. He reports that he does not currently use alcohol after a past usage of about 2.0 standard drinks of alcohol per week. He reports current drug use. Drug: Marijuana.  Allergies  Allergen Reactions   Bee Venom Anaphylaxis    syncope    Family History  Problem Relation Age of Onset   Kidney cancer Sister    Prostate cancer Brother     Prior to Admission medications   Medication Sig Start Date End Date Taking? Authorizing Provider  albuterol  (ACCUNEB ) 0.63 MG/3ML nebulizer solution Take 3 mLs (0.63 mg total) by nebulization every 6 (six) hours as needed for wheezing. 05/25/24  Yes Bryn Bernardino NOVAK, MD  albuterol  (VENTOLIN  HFA) 108 (90 Base) MCG/ACT inhaler Inhale 1 puff into the lungs every 4 (four) hours as needed for wheezing. 01/02/24 01/01/25 Yes Tobie Yetta HERO, MD  amiodarone  (PACERONE ) 200 MG tablet Take 1 tablet (200 mg total) by mouth 2 (two) times daily for 6 days, THEN 1 tablet (200 mg total) daily. 05/14/24 06/19/24 Yes Laurence Locus, DO  amoxicillin -clavulanate (AUGMENTIN ) 875-125 MG tablet Take 1 tablet by mouth 2 (two) times daily for 4 days. 05/25/24 05/29/24 Yes Bryn Bernardino NOVAK, MD  binimetinib  (MEKTOVI ) 15 MG tablet Take 3 tablets (45 mg total) by mouth 2 (two) times daily. 05/14/24  Yes Davonna Siad, MD  budesonide -glycopyrrolate -formoterol  (BREZTRI  AEROSPHERE) 160-9-4.8 MCG/ACT AERO inhaler Inhale 2 puffs into the lungs in the morning and at bedtime. 01/02/24  Yes Tobie Yetta HERO, MD  dapagliflozin propanediol (FARXIGA) 10 MG TABS tablet Take 1 tablet (10 mg total) by mouth daily. 05/15/24 06/14/24 Yes Laurence Locus, DO   encorafenib  (BRAFTOVI ) 75 MG capsule Take 6 capsules (450 mg total) by mouth daily. 05/14/24  Yes Davonna Siad, MD  EPINEPHrine  0.3 mg/0.3 mL IJ SOAJ injection Inject 0.3 mg into the muscle as needed for anaphylaxis. 02/14/24  Yes [provider]  HYDROcodone-acetaminophen  (NORCO/VICODIN) 5-325 MG tablet Take 1 tablet by mouth every 6 (six) hours as needed. 05/17/24  Yes [provider]  metoprolol  succinate (TOPROL -XL) 100 MG 24 hr tablet Take 1 tablet (100 mg total) by mouth daily. Take with or immediately following a meal. 05/15/24 06/14/24 Yes Laurence Locus, DO  predniSONE  (DELTASONE ) 20 MG tablet Take 2 tablets (40 mg total) by mouth daily with breakfast for 4 days. 05/26/24 05/30/24 Yes Bryn Bernardino NOVAK, MD  prochlorperazine  (COMPAZINE ) 10 MG tablet TAKE 1 TABLET(10 MG) BY MOUTH EVERY 6 HOURS AS NEEDED 02/23/24  Yes Rogers Hai, MD  tamsulosin  (FLOMAX ) 0.4 MG CAPS capsule Take 1 capsule (0.4 mg total) by mouth daily. 05/15/24 06/14/24 Yes Laurence Locus, DO  warfarin (COUMADIN ) 10 MG tablet Or as directed by coumadin  clinic.  Take 10 mg M, F and 5 mg all other days Patient taking differently: Or as directed by coumadin  clinic. Take 1/2 tablet (  5mg ) daily unitl next clinic visit 05/14/24  Yes Laurence Locus, DO  Oxycodone  HCl 10 MG TABS Take 1 tablet (10 mg total) by mouth every 4 (four) hours as needed for severe pain (pain score 7-10). Patient not taking: Reported on 05/28/2024 05/14/24   Laurence Locus, DO    Physical Exam: Vitals:   05/28/24 1030 05/28/24 1045 05/28/24 1202 05/28/24 1217  BP: 103/77 (!) 100/52    Pulse: (!) 113 (!) 119 (!) 124   Resp: (!) 22 (!) 21 (!) 27   Temp:    (!) 97.4 F (36.3 C)  TempSrc:    Axillary  SpO2: 98% 99% 99%   Weight:      Height:    6' 1 (1.854 m)  Gen: When reevaluated in ICU, he's laying completely supine, HOB down with BiPAP in place in no distress Pulm: Crackles/coarse L > R, no wheezes on my exam, tachypneic but not labored, speaking  full sentences requesting BiPAP off  CV: Irreg, rate 120's, trace LE edema GI: Soft, ND, +BS, some mid abd tenderness without guarding or rebound. Will keep an eye on this.  Neuro: Alert and oriented. No new focal deficits. Ext: Warm, no deformities. Skin: No new rashes, lesions or ulcers on visualized skin   Data Reviewed: WBC 30.7k (PMN 24k), platelet 142k, hgb 13.4 Cr 1.26 ProBNP: 4,264.  LA 2.7 > 2.7 Trop 20 > 20 CXR (personal interpretation): Patchy left sided opacity is new.  ECG AFib with RVR (vent rate ~150)   Assessment and Plan: Sepsis due to HCAP in immunosuppressed host: WBC further up to 30.7 (confounding factors contributing) with tachycardia complicated by hypotension. Medial left sided infiltrate is new on CXR.  - Continue vancomycin /cefepime  pending blood cultures.  - Sputum culture if able to expectorate - Urine Ag's - LA still elevated, though perfusing by exam. Will continue IVF in this setting for now, monitoring volume status closely.   Acute hypoxic respiratory failure: Was only 89% with ambulation at discharge recently, so did not receive home oxygen  due to insurance rules. Now returning with respiratory distress and new infiltrates.  - Blood gas reassuring, appears to have been on NIPPV mostly for effort. He's speaking in full sentences now through the BiPAP, requesting we take it off, will wean to nasal cannula oxygen  for goal SpO2 >88% with normal work of breathing.   Covid-19 infection: Positive PCR 10/10, still positive.  - Maintain airborne/contact precautions. Given his immunocompromise, may expect a prolonged course/carrier state/isolation need. - Giving steroids as below  AECOPD:  - Continue solumedrol, scheduled and prn nebs (levalbuterol  in place of albuterol ) - Pulmonary hygiene - F/u with pulmonary after discharge.   Suspected superimposed CAP:  - Given immunocompromised status, elevated PCT, Neutrophilia above baseline, more focal opacity  compared to prior, we will plan for 5 days abx   PAF with RVR: Does not appear to tolerate rapid AFib well. Troponin very mildly elevated, BNP quite elevated.  - Amiodarone  IV, can bolus prn - Holding home oral metop given hypotension, though will order metoprolol  IV prn HR sustained 130+bpm and MAP >76mmHg.  - Coumadin  per pharmacy. INR 3.0   Lung CA with bony metastases, cancer-associated pain:  - Holding Tx while septic. Continue home opioid prn  Thrombocytopenia: ?due to sepsis  - Monitor in AM.    Advance Care Planning: Full code  Consults: Cardiology  Family Communication: None at bedside  Severity of Illness: The appropriate patient status for this patient is INPATIENT. Inpatient  status is judged to be reasonable and necessary in order to provide the required intensity of service to ensure the patient's safety. The patient's presenting symptoms, physical exam findings, and initial radiographic and laboratory data in the context of their chronic comorbidities is felt to place them at high risk for further clinical deterioration. Furthermore, it is not anticipated that the patient will be medically stable for discharge from the hospital within 2 midnights of admission.   * I certify that at the point of admission it is my clinical judgment that the patient will require inpatient hospital care spanning beyond 2 midnights from the point of admission due to high intensity of service, high risk for further deterioration and high frequency of surveillance required.*  Author: Bernardino KATHEE Come, MD 05/28/2024 12:54 PM  For on call review [email or web address].

## 2024-05-28 NOTE — Progress Notes (Addendum)
 PHARMACY - ANTICOAGULATION CONSULT NOTE  Pharmacy Consult for warfarin Indication: atrial fibrillation  Allergies  Allergen Reactions   Bee Venom Anaphylaxis    syncope    Patient Measurements: Height: 6' 1 (185.4 cm) Weight: 116 kg (255 lb 11.7 oz) IBW/kg (Calculated) : 79.9 HEPARIN  DW (KG): 104.7  Vital Signs: Temp: 97.4 F (36.3 C) (10/14 1217) Temp Source: Axillary (10/14 1217) BP: 100/52 (10/14 1045) Pulse Rate: 124 (10/14 1202)  Labs: Recent Labs    05/28/24 0920  HGB 13.4  HCT 43.0  PLT 142*  LABPROT 32.3*  INR 3.0*  CREATININE 1.26*    Estimated Creatinine Clearance: 72.8 mL/min (A) (by C-G formula based on SCr of 1.26 mg/dL (H)).   Medical History: Past Medical History:  Diagnosis Date   Arthritis    Asthma    Atrial fibrillation (HCC)    Cancer (HCC)    Complication of anesthesia    after his neck surgery pt. 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 male with a history of metastatic lung CA s/p XRT on oral therapy,  paroxysmal AFib s/p DCCV 9/26 on coumadin , COPD, hx CVA who presented to the ED by EMS today due to respiratory distress. Pharmacy asked to dose coumadin . Pt recently discharged on 10/11 on 5mg  of warfarin daily. INR this morning is back up to 3.0, which is at the upper end of goal. Will give very small dose of warfarin tonight. CBC improved this morning. He has been started on IV amiodarone .    Goal of Therapy:  INR 2-3 Monitor platelets by anticoagulation protocol: Yes   Plan:  Warfarin 1mg  tonight Daily INR  Dempsey Blush PharmD., BCPS Clinical  Pharmacist 05/28/2024 1:05 PM

## 2024-05-28 NOTE — ED Triage Notes (Addendum)
 Pt arrived [facility] from home with c/o SOB. Pt baseline RA but fire dept had pt on 2 lpm and in the 80's. Increased to 4 lpm equal 92% then non rebreather up to 96% but pt was struggling to breath. Cpap applied at 7.5. 20 G in right hand by [facility] ,CBG 117, Pt was given 7.5 albuterol , 125 mg of solu medrol , 25 mg Cardizem . Baseline is RA , hx of lung cancer and afib. No meds today.  Pt positive with covid on 05/24/24

## 2024-05-28 NOTE — Progress Notes (Signed)
 Pharmacy Antibiotic Note  Jose Joseph is a 68 y.o. male admitted on 05/28/2024 with HCAP.  Pharmacy has been consulted for vancomycin  and cefepime  dosing.  Patient currently afebrile, wbc 30.7.   Vancomycin  1500 mg IV Q 24 hrs. Goal AUC 400-550. Expected AUC: 465 SCr used: 1.26 Cefepime  2g q8 hours   Height: 6' 1 (185.4 cm) Weight: 116 kg (255 lb 11.7 oz) IBW/kg (Calculated) : 79.9  Temp (24hrs), Avg:97.6 F (36.4 C), Min:97.4 F (36.3 C), Max:97.8 F (36.6 C)  Recent Labs  Lab 05/24/24 1026 05/24/24 1037 05/24/24 1222 05/25/24 0416 05/28/24 0920 05/28/24 1053  WBC 23.0*  --   --  21.6* 30.7*  --   CREATININE 1.40*  --   --  1.24 1.26*  --   LATICACIDVEN  --  2.1* 1.7  --  2.7* 2.7*    Estimated Creatinine Clearance: 72.8 mL/min (A) (by C-G formula based on SCr of 1.26 mg/dL (H)).    Allergies  Allergen Reactions   Bee Venom Anaphylaxis    syncope   Thank you for allowing pharmacy to be a part of this patient's care.  Dempsey Blush PharmD., BCPS Clinical Pharmacist 05/28/2024 3:22 PM

## 2024-05-28 NOTE — ED Provider Notes (Signed)
 [place] EMERGENCY DEPARTMENT AT [facility] Provider Note   CSN: [id number] Arrival date & time: 05/28/24  9093     Patient presents with: Respiratory Distress   Jose Joseph is a 68 y.o. male.   Pt is a 68 yo male with pmhx significant for depression, arthritis, kidney stones, htn, copd, cva, afib (on coumadin ), metastatic lung cancer to the bones(on Braftovi  and Mektovi ) s/p radiation, pericardial effusion.  He presents to the ED today with sob.  EMS found him to be in the 80s on 2L.  They put him on CPAP after he did not seem to improve.  He was given multiple doses of albuterol  as well as solumedrol en route.  He was also given 25 mg cardizem  for his rapid hr.  HR did not improve.  Pt was admitted from 10/10-10/11 for covid-19 and copd and pna.  He was d/c with augmentin . His wife has been giving him nebs every 6 hrs.  Cardizem  and amiodarone  both are not supposed to be used with his chemo meds, but he has been on a amiodarone  taper.  Pt did not take his reg meds today as he was so sob.       Prior to Admission medications   Medication Sig Start Date End Date Taking? Authorizing Provider  albuterol  (ACCUNEB ) 0.63 MG/3ML nebulizer solution Take 3 mLs (0.63 mg total) by nebulization every 6 (six) hours as needed for wheezing. 05/25/24   Bryn Bernardino NOVAK, MD  albuterol  (VENTOLIN  HFA) 108 (90 Base) MCG/ACT inhaler Inhale 1 puff into the lungs every 4 (four) hours as needed for wheezing. 01/02/24 01/01/25  Tobie Yetta HERO, MD  amiodarone  (PACERONE ) 200 MG tablet Take 1 tablet (200 mg total) by mouth 2 (two) times daily for 6 days, THEN 1 tablet (200 mg total) daily. 05/14/24 06/19/24  Laurence Locus, DO  amoxicillin -clavulanate (AUGMENTIN ) 875-125 MG tablet Take 1 tablet by mouth 2 (two) times daily for 4 days. 05/25/24 05/29/24  Bryn Bernardino NOVAK, MD  binimetinib  (MEKTOVI ) 15 MG tablet Take 3 tablets (45 mg total) by mouth 2 (two) times daily. 05/14/24   Kandala, Hyndavi, MD   budesonide -glycopyrrolate -formoterol  (BREZTRI  AEROSPHERE) 160-9-4.8 MCG/ACT AERO inhaler Inhale 2 puffs into the lungs in the morning and at bedtime. 01/02/24   Patel, Pranav M, MD  cetirizine (ZYRTEC) 10 MG tablet Take 10 mg by mouth in the morning.    [provider]  citalopram  (CELEXA ) 20 MG tablet Take 20 mg by mouth in the morning.    [provider]  dapagliflozin propanediol (FARXIGA) 10 MG TABS tablet Take 1 tablet (10 mg total) by mouth daily. 05/15/24 06/14/24  Laurence Locus, DO  encorafenib  (BRAFTOVI ) 75 MG capsule Take 6 capsules (450 mg total) by mouth daily. 05/14/24   Davonna Siad, MD  EPINEPHrine  0.3 mg/0.3 mL IJ SOAJ injection Inject 0.3 mg into the muscle as needed for anaphylaxis. 02/14/24   [provider]  HYDROcodone-acetaminophen  (NORCO/VICODIN) 5-325 MG tablet Take 1 tablet by mouth every 6 (six) hours as needed. 05/17/24   [provider]  metoprolol  succinate (TOPROL -XL) 100 MG 24 hr tablet Take 1 tablet (100 mg total) by mouth daily. Take with or immediately following a meal. 05/15/24 06/14/24  Laurence Locus, DO  Oxycodone  HCl 10 MG TABS Take 1 tablet (10 mg total) by mouth every 4 (four) hours as needed for severe pain (pain score 7-10). 05/14/24   Laurence Locus, DO  predniSONE  (DELTASONE ) 20 MG tablet Take 2 tablets (40  mg total) by mouth daily with breakfast for 4 days. 05/26/24 05/30/24  Bryn Bernardino NOVAK, MD  prochlorperazine  (COMPAZINE ) 10 MG tablet TAKE 1 TABLET(10 MG) BY MOUTH EVERY 6 HOURS AS NEEDED 02/23/24   Rogers Hai, MD  tamsulosin  (FLOMAX ) 0.4 MG CAPS capsule Take 1 capsule (0.4 mg total) by mouth daily. 05/15/24 06/14/24  Laurence Locus, DO  warfarin (COUMADIN ) 10 MG tablet Or as directed by coumadin  clinic.  Take 10 mg M, F and 5 mg all other days 05/14/24   Laurence Locus, DO    Allergies: Bee venom    Review of Systems  Respiratory:  Positive for shortness of breath.   Cardiovascular:  Positive for palpitations.  All other systems  reviewed and are negative.   Updated Vital Signs BP (!) 100/52   Pulse (!) 119   Temp 97.8 F (36.6 C) (Axillary)   Resp (!) 21   Ht 6' 1 (1.854 m)   Wt 116 kg   SpO2 99%   BMI 33.74 kg/m   Physical Exam Vitals and nursing note reviewed.  Constitutional:      General: He is in acute distress.     Appearance: Normal appearance. He is obese. He is ill-appearing.  HENT:     Head: Normocephalic and atraumatic.     Right Ear: External ear normal.     Left Ear: External ear normal.     Nose: Nose normal.     Mouth/Throat:     Mouth: Mucous membranes are dry.  Eyes:     Extraocular Movements: Extraocular movements intact.     Conjunctiva/sclera: Conjunctivae normal.     Pupils: Pupils are equal, round, and reactive to light.  Cardiovascular:     Rate and Rhythm: Tachycardia present. Rhythm irregular.     Pulses: Normal pulses.     Heart sounds: Normal heart sounds.  Pulmonary:     Effort: Respiratory distress present.     Breath sounds: Wheezing and rhonchi present.  Abdominal:     General: Abdomen is flat. Bowel sounds are normal.     Palpations: Abdomen is soft.  Musculoskeletal:        General: Normal range of motion.     Cervical back: Normal range of motion and neck supple.  Skin:    General: Skin is warm.     Capillary Refill: Capillary refill takes less than 2 seconds.  Neurological:     General: No focal deficit present.     Mental Status: He is alert and oriented to person, place, and time.  Psychiatric:        Mood and Affect: Mood normal.        Behavior: Behavior normal.     (all labs ordered are listed, but only abnormal results are displayed) Labs Reviewed  RESP PANEL BY RT-PCR (RSV, FLU A&B, COVID)  RVPGX2 - Abnormal; Notable for the following components:      Result Value   SARS Coronavirus 2 by RT PCR POSITIVE (*)    All other components within normal limits  BASIC METABOLIC PANEL WITH GFR - Abnormal; Notable for the following components:    Glucose, Bld 120 (*)    BUN 41 (*)    Creatinine, Ser 1.26 (*)    All other components within normal limits  PRO BRAIN NATRIURETIC PEPTIDE - Abnormal; Notable for the following components:   Pro Brain Natriuretic Peptide 4,264.0 (*)    All other components within normal limits  CBC WITH DIFFERENTIAL/PLATELET - Abnormal; Notable  for the following components:   WBC 30.7 (*)    Platelets 142 (*)    Neutro Abs 24.0 (*)    Monocytes Absolute 3.0 (*)    Eosinophils Absolute 1.2 (*)    Abs Immature Granulocytes 0.95 (*)    All other components within normal limits  LACTIC ACID, PLASMA - Abnormal; Notable for the following components:   Lactic Acid, Venous 2.7 (*)    All other components within normal limits  PROTIME-INR - Abnormal; Notable for the following components:   Prothrombin Time 32.3 (*)    INR 3.0 (*)    All other components within normal limits  HEPATIC FUNCTION PANEL - Abnormal; Notable for the following components:   Albumin  3.0 (*)    Alkaline Phosphatase 143 (*)    Indirect Bilirubin 0.1 (*)    All other components within normal limits  CBG MONITORING, ED - Abnormal; Notable for the following components:   Glucose-Capillary 118 (*)    All other components within normal limits  TROPONIN T, HIGH SENSITIVITY - Abnormal; Notable for the following components:   Troponin T High Sensitivity 20 (*)    All other components within normal limits  CULTURE, BLOOD (ROUTINE X 2)  CULTURE, BLOOD (ROUTINE X 2)  BLOOD GAS, VENOUS  LACTIC ACID, PLASMA  URINALYSIS, W/ REFLEX TO CULTURE (INFECTION SUSPECTED)  TROPONIN T, HIGH SENSITIVITY    EKG: EKG Interpretation Date/Time:  Tuesday May 28 2024 09:30:22 EDT Ventricular Rate:  140 PR Interval:    QRS Duration:  83 QT Interval:  288 QTC Calculation: 440 R Axis:   14  Text Interpretation: Atrial fibrillation with rapid V-rate Borderline T wave abnormalities now back in afib Confirmed by Dean Clarity [phone]) on 05/28/2024  10:17:48 AM  Radiology: [facility] Chest Port 1 View Result Date: 05/28/2024 CLINICAL DATA:  Shortness of breath. Hypoxia. Positive COVID-19 test on 05/24/2024. EXAM: PORTABLE CHEST 1 VIEW COMPARISON:  05/24/2024 FINDINGS: Borderline enlarged cardiac silhouette. Increased patchy and consolidative density in the medial left lung base. Stable right mid lung linear scarring. No airspace consolidation or pleural fluid. Right jugular porta catheter tip in the superior vena cava. Lower thoracic spine degenerative changes and mild bilateral glenohumeral degenerative changes. IMPRESSION: Increased patchy and consolidative density in the medial left lung base, compatible with pneumonia or atelectasis. Electronically Signed   By: Elspeth Bathe M.D.   On: 05/28/2024 10:22     Procedures   Medications Ordered in the ED  amiodarone  (NEXTERONE ) 1.8 mg/mL load via infusion 150 mg (150 mg Intravenous Bolus from Bag 05/28/24 0943)    Followed by  amiodarone  (NEXTERONE  PREMIX) 360-4.14 MG/200ML-% (1.8 mg/mL) IV infusion (60 mg/hr Intravenous New Bag/Given 05/28/24 0939)    Followed by  amiodarone  (NEXTERONE  PREMIX) 360-4.14 MG/200ML-% (1.8 mg/mL) IV infusion (has no administration in time range)  lactated ringers  infusion ( Intravenous New Bag/Given 05/28/24 1052)  ceFEPIme  (MAXIPIME ) 2 g in sodium chloride  0.9 % 100 mL IVPB (has no administration in time range)  vancomycin  (VANCOREADY) IVPB 2000 mg/400 mL (2,000 mg Intravenous New Bag/Given 05/28/24 0958)  levalbuterol  (XOPENEX ) nebulizer solution 1.25 mg (1.25 mg Nebulization Given 05/28/24 1001)  lactated ringers  bolus 1,000 mL (1,000 mLs Intravenous New Bag/Given 05/28/24 0950)                                    Medical Decision Making Amount and/or Complexity of Data Reviewed Labs: ordered. Radiology:  ordered.  Risk Prescription drug management. Decision regarding hospitalization.   This patient presents to the ED for concern of sob, this involves an  extensive number of treatment options, and is a complaint that carries with it a high risk of complications and morbidity.  The differential diagnosis includes pna   Co morbidities that complicate the patient evaluation  depression, arthritis, kidney stones, htn, copd, cva, afib (on coumadin ), metastatic lung cancer to the bones(on Braftovi  and Mektovi ) s/p radiation, pericardial effusion.   Additional history obtained:  Additional history obtained from epic chart review External records from outside source obtained and reviewed including EMS report   Lab Tests:  I Ordered, and personally interpreted labs.  The pertinent results include:  covid +; flu/rsv neg; bnp elevated at 4264; vbg nl; inr 3.0; lactic elevated at 2.7; cbc with wbc elevated at 30.7 (21.6 on 10/11) and plt low at 142 (197 on 10/11); bmp with cr 1.26 (1.24 on 10/11)   Imaging Studies ordered:  I ordered imaging studies including cxr  I independently visualized and interpreted imaging which showed  Increased patchy and consolidative density in the medial left lung  base, compatible with pneumonia or atelectasis.   I agree with the radiologist interpretation   Cardiac Monitoring:  The patient was maintained on a cardiac monitor.  I personally viewed and interpreted the cardiac monitored which showed an underlying rhythm of: afib with rvr   Medicines ordered and prescription drug management:  I ordered medication including vanc/maxipime /amiodarone   for sx  Reevaluation of the patient after these medicines showed that the patient improved I have reviewed the patients home medicines and have made adjustments as needed   Test Considered:  ct   Critical Interventions:  bipap   Consultations Obtained:  I requested consultation with the cardiologist (Dr. Alvan),  and discussed lab and imaging findings as well as pertinent plan - he will consult for the afib Dr. Bryn (triad) will admit   Problem List  / ED Course:  COPD exac:  improving with bipap HCAP:  pt d/c on 10/11 with augmentin .  Now on iv vanc and maxipime  Covid-19:  dx on 10/10 Afib with rvr:  pt is not a good candidate for sedation now.  He's on iv amiodarone .  HR coming down with amiodarone .  Cards will follow. Sepsis:  code sepsis called.  He's not in septic shock and hx chf, so 30 cc/kg bolus not given.  Iv abx given.   Reevaluation:  After the interventions noted above, I reevaluated the patient and found that they have :improved   Social Determinants of Health:  Lives at home   Dispostion:  After consideration of the diagnostic results and the patients response to treatment, I feel that the patent would benefit from admission.  CRITICAL CARE Performed by: Mliss Boyers   Total critical care time: 45 minutes  Critical care time was exclusive of separately billable procedures and treating other patients.  Critical care was necessary to treat or prevent imminent or life-threatening deterioration.  Critical care was time spent personally by me on the following activities: development of treatment plan with patient and/or surrogate as well as nursing, discussions with consultants, evaluation of patient's response to treatment, examination of patient, obtaining history from patient or surrogate, ordering and performing treatments and interventions, ordering and review of laboratory studies, ordering and review of radiographic studies, pulse oximetry and re-evaluation of patient's condition.        Final diagnoses:  COPD exacerbation (HCC)  Atrial fibrillation with rapid ventricular response (HCC)  HCAP (healthcare-associated pneumonia)  Sepsis with acute hypoxic respiratory failure without septic shock, due to unspecified organism [facility])    ED Discharge Orders     None          Dean Clarity, MD 05/28/24 1115

## 2024-05-28 NOTE — Sepsis Progress Note (Signed)
 Sepsis protocol monitored by eLink

## 2024-05-28 NOTE — ED Notes (Signed)
 EDP at bedside during triage

## 2024-05-28 NOTE — Consult Note (Signed)
 Cardiology Consultation   Patient ID: Jose Joseph MRN: [id number]; DOB: 03-Mar-1956  Admit date: 05/28/2024 Date of Consult: 05/28/2024  PCP:  Sheryle Carwin, MD   [facility] Providers Cardiologist:  Diannah SHAUNNA Maywood, MD      Patient Profile: Jose Joseph is a 68 y.o. male with a hx of  PAF (initially diagnosed 12/2023 s/p DC DCCV/TEE in 12/2023, recurrent afib w/ successful DCCV 05/01/2024), HTN, COPD, stage IV lung cancer, prior CVA, chronic HFpEF who is being seen 05/28/2024 for the evaluation of afib with RVR at the request of Dr Bryn.  History of Present Illness: [name] 68 yo male history of PAF (initially diagnosed 12/2023 s/p DC DCCV/TEE in 12/2023, recurrent afib w/ successful DCCV 05/01/2024), HTN, COPD, stage IV lung cancer, prior CVA, chronic HFpEF presents with SOB and hypoxia at home. Diagnosed with HCAP/sepsis being managed by primary team. In setting of severe systemic stress has recurrent afib with RVR>    From afib standpoint initially diagnosed 12/2023, had TEE/DCCV at the time. Recurrent afib 05/01/24 in setting of SIRs Medical therapy has been limited by interactions with chemo agents. From 05/09/24 consult note was further reviewed to avoid diltiazem  and DOACs, but other agents including amio acceptable. He was started on amidoarone during that admission. -05/10/24 TEE/DCCV was unsuccesful while on amio. He later converted on his on on IV amio later in admission.  - this admit 05/28/24 recurrent afib with RVR in setting of severe pneumonia/sepsis. Started on IV amio in ER.     K 3.9 BUN 41 Cr 1.26 Lactic acid 2.7 ProBNP 4264 WBC 30.7 Hgb 13.4 Plt 142  Trop 20-->20 CXR increased patch consolidative density medial left lug EKG afib 155    Past Medical History:  Diagnosis Date   Arthritis    Asthma    Atrial fibrillation (HCC)    Cancer (HCC)    Complication of anesthesia    after his neck surgery pt. trying to get up and walk out   COPD  (chronic obstructive pulmonary disease) (HCC)    COPD with acute exacerbation (HCC) 12/15/2023   Depression    History of kidney stones    2021   History of stroke 12/26/2023   Hypertension    Metastatic lung carcinoma, right (HCC) 08/2023   Pericardial effusion 12/26/2023   Status post total replacement of left hip 01/15/2021   Stroke [facility])     Past Surgical History:  Procedure Laterality Date   BRONCHIAL BIOPSY  11/21/2023   Procedure: BRONCHOSCOPY, WITH BIOPSY;  Surgeon: Shelah Lamar RAMAN, MD;  Location: [facility] ENDOSCOPY;  Service: Pulmonary;;   BRONCHIAL NEEDLE ASPIRATION BIOPSY  11/21/2023   Procedure: BRONCHOSCOPY, WITH NEEDLE ASPIRATION BIOPSY;  Surgeon: Shelah Lamar RAMAN, MD;  Location: [facility] ENDOSCOPY;  Service: Pulmonary;;   CARDIOVERSION N/A 01/01/2024   Procedure: CARDIOVERSION;  Surgeon: Francyne Headland, MD;  Location: [facility] INVASIVE CV LAB;  Service: Cardiovascular;  Laterality: N/A;   CARDIOVERSION N/A 05/10/2024   Procedure: CARDIOVERSION;  Surgeon: Rolan Ezra RAMAN, MD;  Location: [facility] INVASIVE CV LAB;  Service: Cardiovascular;  Laterality: N/A;   COLONOSCOPY WITH PROPOFOL  N/A 01/19/2023   Procedure: COLONOSCOPY WITH PROPOFOL ;  Surgeon: Shaaron Lamar HERO, MD;  Location: [facility] ENDO SUITE;  Service: Endoscopy;  Laterality: N/A;  200pm, asa 2   ENDOBRONCHIAL ULTRASOUND Bilateral 11/21/2023   Procedure: ENDOBRONCHIAL ULTRASOUND (EBUS);  Surgeon: Shelah Lamar RAMAN, MD;  Location: [facility] ENDOSCOPY;  Service: Pulmonary;  Laterality: Bilateral;   HERNIA REPAIR  IR IMAGING GUIDED PORT INSERTION  12/14/2023   IR THORACENTESIS ASP PLEURAL SPACE W/IMG GUIDE  12/28/2023   KNEE ARTHROCENTESIS     NECK SURGERY     POLYPECTOMY  01/19/2023   Procedure: POLYPECTOMY;  Surgeon: Shaaron Lamar HERO, MD;  Location: [facility] ENDO SUITE;  Service: Endoscopy;;   TOTAL HIP ARTHROPLASTY Left 01/15/2021   Procedure: LEFT TOTAL HIP ARTHROPLASTY ANTERIOR APPROACH;  Surgeon: Barbarann Oneil BROCKS, MD;  Location: [facility] OR;  Service:  Orthopedics;  Laterality: Left;   TRANSESOPHAGEAL ECHOCARDIOGRAM (CATH LAB) N/A 01/01/2024   Procedure: TRANSESOPHAGEAL ECHOCARDIOGRAM;  Surgeon: Francyne Headland, MD;  Location: [facility] INVASIVE CV LAB;  Service: Cardiovascular;  Laterality: N/A;   TRANSESOPHAGEAL ECHOCARDIOGRAM (CATH LAB) N/A 05/10/2024   Procedure: TRANSESOPHAGEAL ECHOCARDIOGRAM;  Surgeon: Rolan Ezra RAMAN, MD;  Location: [facility] INVASIVE CV LAB;  Service: Cardiovascular;  Laterality: N/A;   VIDEO BRONCHOSCOPY WITH ENDOBRONCHIAL NAVIGATION Bilateral 11/21/2023   Procedure: VIDEO BRONCHOSCOPY WITH ENDOBRONCHIAL NAVIGATION;  Surgeon: Shelah Lamar RAMAN, MD;  Location: [facility] ENDOSCOPY;  Service: Pulmonary;  Laterality: Bilateral;   WRIST ARTHROPLASTY         Scheduled Meds:  Continuous Infusions:  amiodarone  60 mg/hr (05/28/24 0939)   Followed by   amiodarone      ceFEPime  (MAXIPIME ) IV     lactated ringers  150 mL/hr at 05/28/24 1052   vancomycin  2,000 mg (05/28/24 0958)   PRN Meds:   Allergies:    Allergies  Allergen Reactions   Bee Venom Anaphylaxis    syncope    Social History:   Social History   Socioeconomic History   Marital status: Married    Spouse name: Not on file   Number of children: 2   Years of education: Not on file   Highest education level: Not on file  Occupational History   Occupation: retired  Tobacco Use   Smoking status: Former    Current packs/day: 0.00    Average packs/day: 1 pack/day for 35.0 years (35.0 ttl pk-yrs)    Types: Cigarettes    Start date: 33    Quit date: 2014    Years since quitting: 11.7    Passive exposure: Past   Smokeless tobacco: Never  Vaping Use   Vaping status: Never Used  Substance and Sexual Activity   Alcohol use: Not Currently    Alcohol/week: 2.0 standard drinks of alcohol    Types: 2 Cans of beer per week   Drug use: Yes    Types: Marijuana    Comment: last: 1 week ago   Sexual activity: Not on file  Other Topics Concern   Not on file  Social History  Narrative   Not on file   Social Drivers of Health   Financial Resource Strain: Not on file  Food Insecurity: No Food Insecurity (05/24/2024)   Hunger Vital Sign    Worried About Running Out of Food in the Last Year: Never true    Ran Out of Food in the Last Year: Never true  Transportation Needs: No Transportation Needs (05/24/2024)   PRAPARE - Administrator, Civil Service (Medical): No    Lack of Transportation (Non-Medical): No  Physical Activity: Not on file  Stress: Not on file  Social Connections: Moderately Isolated (05/24/2024)   Social Connection and Isolation Panel    Frequency of Communication with Friends and Family: More than three times a week    Frequency of Social Gatherings with Friends and Family: Once a week    Attends Religious Services:  Never    Active Member of Clubs or Organizations: No    Attends Banker Meetings: Never    Marital Status: Married  Catering manager Violence: Not At Risk (05/24/2024)   Humiliation, Afraid, Rape, and Kick questionnaire    Fear of Current or Ex-Partner: No    Emotionally Abused: No    Physically Abused: No    Sexually Abused: No    Family History:    Family History  Problem Relation Age of Onset   Kidney cancer Sister    Prostate cancer Brother      ROS:  Please see the history of present illness.   All other ROS reviewed and negative.     Physical Exam/Data: Vitals:   05/28/24 1003 05/28/24 1015 05/28/24 1030 05/28/24 1045  BP:  (!) 107/49 103/77 (!) 100/52  Pulse:  (!) 115 (!) 113 (!) 119  Resp:  (!) 28 (!) 22 (!) 21  Temp:      TempSrc:      SpO2: 99% 98% 98% 99%  Weight:      Height:       No intake or output data in the 24 hours ending 05/28/24 1155    05/28/2024    9:19 AM 05/24/2024   10:10 AM 05/22/2024    1:07 PM  Last 3 Weights  Weight (lbs) 255 lb 11.7 oz 255 lb 11.7 oz 256 lb  Weight (kg) 116 kg 116 kg 116.121 kg     Body mass index is 33.74 kg/m.  General:   Well nourished, well developed, in no acute distress HEENT: normal Neck: no JVD Vascular: No carotid bruits; Distal pulses 2+ bilaterally Cardiac:  irreg, tachy Lungs:  wheezing bilatearlly  Abd: soft, nontender, no hepatomegaly  Ext: no edema Musculoskeletal:  No deformities, BUE and BLE strength normal and equal Skin: warm and dry  Neuro:  CNs 2-12 intact, no focal abnormalities noted Psych:  Normal affect      Laboratory Data: High Sensitivity Troponin:   Recent Labs  Lab 05/01/24 1020 05/01/24 1231 05/09/24 0455 05/09/24 0642  TROPONINIHS 8 6 320* 452*     Chemistry Recent Labs  Lab 05/24/24 1026 05/25/24 0416 05/28/24 0920  NA 141 140 140  K 4.0 3.7 3.9  CL 102 104 103  CO2 24 25 23   GLUCOSE 112* 107* 120*  BUN 41* 37* 41*  CREATININE 1.40* 1.24 1.26*  CALCIUM 9.6 9.6 10.2  GFRNONAA 54* >60 >60  ANIONGAP 16* 11 14    Recent Labs  Lab 05/24/24 1026 05/25/24 0416 05/28/24 0956  PROT 6.7 6.4* 6.5  ALBUMIN  3.1* 3.1* 3.0*  AST 33 25 31  ALT 19 17 23   ALKPHOS 135* 119 143*  BILITOT 0.3 <0.2 0.2   Lipids No results for input(s): CHOL, TRIG, HDL, LABVLDL, LDLCALC, CHOLHDL in the last 168 hours.  Hematology Recent Labs  Lab 05/24/24 1026 05/25/24 0416 05/28/24 0920  WBC 23.0* 21.6* 30.7*  RBC 4.42 4.11* 4.65  HGB 12.5* 11.6* 13.4  HCT 40.0 37.9* 43.0  MCV 90.5 92.2 92.5  MCH 28.3 28.2 28.8  MCHC 31.3 30.6 31.2  RDW 13.7 13.9 14.6  PLT 229 197 142*   Thyroid No results for input(s): TSH, FREET4 in the last 168 hours.  BNP Recent Labs  Lab 05/28/24 0920  PROBNP 4,264.0*    DDimer No results for input(s): DDIMER in the last 168 hours.  Radiology/Studies:  DG Chest Port 1 View Result Date: 05/28/2024 CLINICAL DATA:  Shortness  of breath. Hypoxia. Positive COVID-19 test on 05/24/2024. EXAM: PORTABLE CHEST 1 VIEW COMPARISON:  05/24/2024 FINDINGS: Borderline enlarged cardiac silhouette. Increased patchy and consolidative  density in the medial left lung base. Stable right mid lung linear scarring. No airspace consolidation or pleural fluid. Right jugular porta catheter tip in the superior vena cava. Lower thoracic spine degenerative changes and mild bilateral glenohumeral degenerative changes. IMPRESSION: Increased patchy and consolidative density in the medial left lung base, compatible with pneumonia or atelectasis. Electronically Signed   By: Elspeth Bathe M.D.   On: 05/28/2024 10:22     Assessment and Plan: 1.PAF - initially diagnosed 12/2023, had TEE/DCCV at the time. Recurrent afib 05/01/24 in setting of SIRs Medical therapy has been limited by interactions with chemo agents. From 05/09/24 consult note was further reviewed to avoid diltiazem  and DOACs, but other agents including amio acceptable. He was started on amidoarone during that admission. -05/10/24 TEE/DCCV was unsuccesful while on amio. He later converted on his on on IV amio later in admission.  - this admit 05/28/24 recurrent afib with RVR in setting of severe pneumonia/sepsis. Started on IV amio in ER.  - continue IV amio, expect some degree of tachycardia given hypoxia, sepsis/pneumonia. I think rates 110s to 120s would be reasonable given systemic illness. Could rebolus IV amio 150mg  if needed - coumadin  per pharmacy, cannot be on DOACs due to medication interactions with chemo agents   2. Chronic HFpEF - does not appear fluid overloaded by exam. Receiving IVFs in setting of sepsis protocol, monitor fluid status closely.      For questions or updates, please contact [facility] Please consult [email or web address] for contact info under      Signed, Alvan Carrier, MD  05/28/2024 11:55 AM

## 2024-05-29 ENCOUNTER — Telehealth: Payer: Self-pay

## 2024-05-29 ENCOUNTER — Ambulatory Visit: Admitting: Acute Care

## 2024-05-29 ENCOUNTER — Inpatient Hospital Stay (HOSPITAL_COMMUNITY)

## 2024-05-29 DIAGNOSIS — J189 Pneumonia, unspecified organism: Secondary | ICD-10-CM | POA: Diagnosis not present

## 2024-05-29 DIAGNOSIS — I480 Paroxysmal atrial fibrillation: Secondary | ICD-10-CM | POA: Diagnosis not present

## 2024-05-29 DIAGNOSIS — I5032 Chronic diastolic (congestive) heart failure: Secondary | ICD-10-CM | POA: Diagnosis not present

## 2024-05-29 LAB — LACTIC ACID, PLASMA: Lactic Acid, Venous: 1.8 mmol/L (ref 0.5–1.9)

## 2024-05-29 LAB — CBC
HCT: 35.4 % — ABNORMAL LOW (ref 39.0–52.0)
Hemoglobin: 10.7 g/dL — ABNORMAL LOW (ref 13.0–17.0)
MCH: 29.2 pg (ref 26.0–34.0)
MCHC: 30.2 g/dL (ref 30.0–36.0)
MCV: 96.5 fL (ref 80.0–100.0)
Platelets: 110 K/uL — ABNORMAL LOW (ref 150–400)
RBC: 3.67 MIL/uL — ABNORMAL LOW (ref 4.22–5.81)
RDW: 14.7 % (ref 11.5–15.5)
WBC: 29.3 K/uL — ABNORMAL HIGH (ref 4.0–10.5)
nRBC: 0.1 % (ref 0.0–0.2)

## 2024-05-29 LAB — BASIC METABOLIC PANEL WITH GFR
Anion gap: 10 (ref 5–15)
BUN: 37 mg/dL — ABNORMAL HIGH (ref 8–23)
CO2: 26 mmol/L (ref 22–32)
Calcium: 9.4 mg/dL (ref 8.9–10.3)
Chloride: 107 mmol/L (ref 98–111)
Creatinine, Ser: 1.38 mg/dL — ABNORMAL HIGH (ref 0.61–1.24)
GFR, Estimated: 55 mL/min — ABNORMAL LOW (ref >60)
Glucose, Bld: 112 mg/dL — ABNORMAL HIGH (ref 70–99)
Potassium: 3.9 mmol/L (ref 3.5–5.1)
Sodium: 143 mmol/L (ref 135–145)

## 2024-05-29 LAB — PROTIME-INR
INR: 4.2 (ref 0.8–1.2)
Prothrombin Time: 42.2 s — ABNORMAL HIGH (ref 11.4–15.2)

## 2024-05-29 MED ORDER — FUROSEMIDE 10 MG/ML IJ SOLN
40.0000 mg | Freq: Once | INTRAMUSCULAR | Status: AC
  Administered 2024-05-29: 40 mg via INTRAVENOUS
  Filled 2024-05-29: qty 4

## 2024-05-29 MED ORDER — ENSURE PLUS HIGH PROTEIN PO LIQD
237.0000 mL | Freq: Two times a day (BID) | ORAL | Status: DC
  Administered 2024-05-29 (×2): 237 mL via ORAL

## 2024-05-29 MED ORDER — METOPROLOL TARTRATE 25 MG PO TABS
25.0000 mg | ORAL_TABLET | Freq: Four times a day (QID) | ORAL | Status: AC
Start: 2024-05-29 — End: ?
  Administered 2024-05-29 – 2024-05-30 (×7): 25 mg via ORAL
  Filled 2024-05-29 (×7): qty 1

## 2024-05-29 MED ORDER — METOPROLOL TARTRATE 25 MG PO TABS: 25.0000 mg | ORAL_TABLET | Freq: Four times a day (QID) | ORAL | Status: DC

## 2024-05-29 MED ORDER — AMIODARONE LOAD VIA INFUSION
150.0000 mg | Freq: Once | INTRAVENOUS | Status: AC
  Administered 2024-05-29: 150 mg via INTRAVENOUS
  Filled 2024-05-29: qty 83.34

## 2024-05-29 MED ORDER — MELATONIN 3 MG PO TABS
3.0000 mg | ORAL_TABLET | Freq: Every day | ORAL | Status: DC
  Administered 2024-05-29 – 2024-05-30 (×2): 3 mg via ORAL
  Filled 2024-05-29 (×2): qty 1

## 2024-05-29 MED ORDER — ALUM & MAG HYDROXIDE-SIMETH 200-200-20 MG/5ML PO SUSP
30.0000 mL | Freq: Four times a day (QID) | ORAL | Status: DC | PRN
  Administered 2024-05-29: 30 mL via ORAL
  Filled 2024-05-29: qty 30

## 2024-05-29 NOTE — Progress Notes (Signed)
 PHARMACY - ANTICOAGULATION CONSULT NOTE  Pharmacy Consult for warfarin Indication: atrial fibrillation  Allergies  Allergen Reactions   Bee Venom Anaphylaxis    syncope    Patient Measurements: Height: 6' 1 (185.4 cm) Weight: 116 kg (255 lb 11.7 oz) IBW/kg (Calculated) : 79.9 HEPARIN  DW (KG): 104.7  Vital Signs: Temp: 97.8 F (36.6 C) (10/15 0738) Temp Source: Oral (10/15 0738) BP: 121/72 (10/15 0500) Pulse Rate: 110 (10/15 0500)  Labs: Recent Labs    05/28/24 0920 05/29/24 0446  HGB 13.4 10.7*  HCT 43.0 35.4*  PLT 142* 110*  LABPROT 32.3* 42.2*  INR 3.0* 4.2*  CREATININE 1.26* 1.38*    Estimated Creatinine Clearance: 66.4 mL/min (A) (by C-G formula based on SCr of 1.38 mg/dL (H)).   Medical History: Past Medical History:  Diagnosis Date   Arthritis    Asthma    Atrial fibrillation (HCC)    Cancer (HCC)    Complication of anesthesia    after his neck surgery pt. trying to get up and walk out   COPD (chronic obstructive pulmonary disease) (HCC)    COPD with acute exacerbation (HCC) 12/15/2023   Depression    History of kidney stones    2021   History of stroke 12/26/2023   Hypertension    Metastatic lung carcinoma, right (HCC) 08/2023   Pericardial effusion 12/26/2023   Status post total replacement of left hip 01/15/2021   Stroke [facility])     Assessment: 68 y.o. male with a history of metastatic lung CA s/p XRT on oral therapy,  paroxysmal AFib s/p DCCV 9/26 on coumadin , COPD, hx CVA who presented to the ED by EMS today due to respiratory distress. Pharmacy asked to dose coumadin . Patient recently discharged on 10/11 on 5mg  of warfarin daily. INR on admit was 3.0, low dose given last night and INR now trended up to above 4 today. Will hold warfarin for now.    Goal of Therapy:  INR 2-3 Monitor platelets by anticoagulation protocol: Yes   Plan:  Hold warfarin for now Daily INR  Dempsey Blush PharmD., BCPS Clinical Pharmacist 05/29/2024 8:30  AM

## 2024-05-29 NOTE — Progress Notes (Signed)
 1830: Patient began to desat to 75% on 4LNC. Respirations became increased and labored. Audible wheezing. I increased patient to 10L and Spo2 started coming up, and then patient desated again. Respiratory at bedside and placed patient on Bipap.  1850: Patient is 99% on Bipap. Alert and oriented x4.   1852: 40mg  of Lasix  given. Radiology at bedside for portable chest x-ray.   MD is aware. Will advise to oncoming RN for nighttime MD to view CXR.

## 2024-05-29 NOTE — Progress Notes (Signed)
 Rounding Note   Patient Name: Jose Joseph Date of Encounter: 05/29/2024  [facility] Cardiologist: Diannah SHAUNNA Maywood, MD   Subjective Some improvement in SOB  Scheduled Meds:  Chlorhexidine  Gluconate Cloth  6 each Topical Q0600   feeding supplement  237 mL Oral BID BM   ipratropium  0.5 mg Nebulization Q6H   levalbuterol   0.63 mg Nebulization Q6H   methylPREDNISolone  (SOLU-MEDROL ) injection  40 mg Intravenous Q12H   sodium chloride  flush  3 mL Intravenous Q12H   tamsulosin   0.4 mg Oral Daily   Warfarin - Pharmacist Dosing Inpatient   Does not apply q1600   Continuous Infusions:  amiodarone  30 mg/hr (05/29/24 0304)   ceFEPime  (MAXIPIME ) IV 2 g (05/29/24 0549)   vancomycin      PRN Meds: acetaminophen  **OR** acetaminophen , HYDROcodone-acetaminophen , levalbuterol , metoprolol  tartrate   Vital Signs  Vitals:   05/29/24 0400 05/29/24 0450 05/29/24 0500 05/29/24 0738  BP: 128/65  121/72   Pulse: (!) 115  (!) 110   Resp: (!) 25  (!) 28   Temp:  97.7 F (36.5 C)  97.8 F (36.6 C)  TempSrc:  Oral  Oral  SpO2: 90%  95%   Weight:      Height:        Intake/Output Summary (Last 24 hours) at 05/29/2024 0821 Last data filed at 05/29/2024 0448 Gross per 24 hour  Intake 2798.18 ml  Output 750 ml  Net 2048.18 ml      05/28/2024    9:19 AM 05/24/2024   10:10 AM 05/22/2024    1:07 PM  Last 3 Weights  Weight (lbs) 255 lb 11.7 oz 255 lb 11.7 oz 256 lb  Weight (kg) 116 kg 116 kg 116.121 kg      Telemetry Afib elevated rates - Personally Reviewed  ECG  N/a - Personally Reviewed  Physical Exam  GEN: No acute distress.   Neck: No JVD Cardiac: irreg, tachy Respiratory: bilateral wheezing GI: Soft, nontender, non-distended  MS: No edema; No deformity. Neuro:  Nonfocal  Psych: Normal affect   Labs High Sensitivity Troponin:   Recent Labs  Lab 05/01/24 1020 05/01/24 1231 05/09/24 0455 05/09/24 0642  TROPONINIHS 8 6 320* 452*      Chemistry Recent Labs  Lab 05/24/24 1026 05/25/24 0416 05/28/24 0920 05/28/24 0956 05/29/24 0446  NA 141 140 140  --  143  K 4.0 3.7 3.9  --  3.9  CL 102 104 103  --  107  CO2 24 25 23   --  26  GLUCOSE 112* 107* 120*  --  112*  BUN 41* 37* 41*  --  37*  CREATININE 1.40* 1.24 1.26*  --  1.38*  CALCIUM 9.6 9.6 10.2  --  9.4  PROT 6.7 6.4*  --  6.5  --   ALBUMIN  3.1* 3.1*  --  3.0*  --   AST 33 25  --  31  --   ALT 19 17  --  23  --   ALKPHOS 135* 119  --  143*  --   BILITOT 0.3 <0.2  --  0.2  --   GFRNONAA 54* >60 >60  --  55*  ANIONGAP 16* 11 14  --  10    Lipids No results for input(s): CHOL, TRIG, HDL, LABVLDL, LDLCALC, CHOLHDL in the last 168 hours.  Hematology Recent Labs  Lab 05/25/24 0416 05/28/24 0920 05/29/24 0446  WBC 21.6* 30.7* 29.3*  RBC 4.11* 4.65 3.67*  HGB 11.6* 13.4  10.7*  HCT 37.9* 43.0 35.4*  MCV 92.2 92.5 96.5  MCH 28.2 28.8 29.2  MCHC 30.6 31.2 30.2  RDW 13.9 14.6 14.7  PLT 197 142* 110*   Thyroid No results for input(s): TSH, FREET4 in the last 168 hours.  BNP Recent Labs  Lab 05/28/24 0920  PROBNP 4,264.0*    DDimer No results for input(s): DDIMER in the last 168 hours.   Radiology  DG Chest Port 1 View Result Date: 05/28/2024 CLINICAL DATA:  Shortness of breath. Hypoxia. Positive COVID-19 test on 05/24/2024. EXAM: PORTABLE CHEST 1 VIEW COMPARISON:  05/24/2024 FINDINGS: Borderline enlarged cardiac silhouette. Increased patchy and consolidative density in the medial left lung base. Stable right mid lung linear scarring. No airspace consolidation or pleural fluid. Right jugular porta catheter tip in the superior vena cava. Lower thoracic spine degenerative changes and mild bilateral glenohumeral degenerative changes. IMPRESSION: Increased patchy and consolidative density in the medial left lung base, compatible with pneumonia or atelectasis. Electronically Signed   By: Elspeth Bathe M.D.   On: 05/28/2024 10:22    Cardiac  Studies   Patient Profile   Shanna Strength is a 68 y.o. male with a hx of  PAF (initially diagnosed 12/2023 s/p DC DCCV/TEE in 12/2023, recurrent afib w/ successful DCCV 05/01/2024), HTN, COPD, stage IV lung cancer, prior CVA, chronic HFpEF who is being seen 05/28/2024 for the evaluation of afib with RVR at the request of Dr Bryn.   Assessment & Plan   1.PAF - initially diagnosed 12/2023, had TEE/DCCV at the time. Recurrent afib 05/01/24 in setting of SIRs Medical therapy has been limited by interactions with chemo agents. From 05/09/24 cardiology consult note was further reviewed with pharmacy, recs were avoid diltiazem  and DOACs, but other agents including amio acceptable. He was started on amidoarone during that admission. -05/10/24 TEE/DCCV was unsuccesful while on amio. He later converted on his on IV amio later in that admission.   - this admit 05/28/24 recurrent afib with RVR in setting of severe pneumonia/sepsis. Started on IV amio in ER.  - remains in afib with RVR, rebolus IV amio 150mg  and then continue at rate of 30mg /hr - stopped home toprol , using lopressor  for more flexibility in dosing. Start lopressor  25mg  q6 hrs with hold parameters.  - coumadin  per pharmacy, cannot be on DOACs due to medication interactions with chemo agents     2. Chronic HFpEF - does not appear fluid overloaded by exam. Receiving IVFs in setting of sepsis protocol, monitor fluid status closely.    3.Sepsis/HCAPCOPD exacerbation/Recent covid 05/24/24 - per primary team  4. Lung cancer   For questions or updates, please contact [facility] Please consult [email or web address] for contact info under       Signed, Alvan Carrier, MD  05/29/2024, 8:21 AM

## 2024-05-29 NOTE — Progress Notes (Signed)
 Called by RN to give patient a neb due to wheezing. Upon entering room, patient found to be labored respirations and diaphoretic. I placed patient on nebulizer and then started Bipap and stopped neb but placed it in line through aerogen. Patients SPO2 increased to 99% after Bipap and RR slowed some.

## 2024-05-29 NOTE — Plan of Care (Signed)
  Problem: Clinical Measurements: Goal: Respiratory complications will improve Outcome: Progressing Goal: Cardiovascular complication will be avoided Outcome: Not Progressing   Problem: Respiratory: Goal: Levels of oxygenation will improve Outcome: Progressing Goal: Ability to maintain adequate ventilation will improve Outcome: Progressing

## 2024-05-29 NOTE — TOC Initial Note (Signed)
 Transition of Care [facility]) - Initial/Assessment Note    Patient Details  Name: Jose Joseph MRN: [id number] Date of Birth: 03/18/1956  Transition of Care [facility]) CM/SW Contact:    Noreen KATHEE Pinal, LCSWA Phone Number: 05/29/2024, 1:45 PM  Clinical Narrative:                  Patient is risk for readmission. Patient was admitted for HCAP (healthcare-associated pneumonia). CSW assessed patient. Patient reports that it is him , his wife, and her disabled son in the home. Patient is independent with helo from his spouse. He reports having a rollator for equipment. According to patient last admission an nebulizer machine was ordered. Patient expressed still being able to drive but lets his wife do the driving. Currently patient does not have any in home care services. CSW will continue to follow.    Expected Discharge Plan: Home/Self Care Barriers to Discharge: Continued Medical Work up   Patient Goals and CMS Choice Patient states their goals for this hospitalization and ongoing recovery are:: return back home CMS Medicare.gov Compare Post Acute Care list provided to:: Patient        Expected Discharge Plan and Services In-house Referral: Clinical Social Work   Post Acute Care Choice: Durable Medical Equipment Living arrangements for the past 2 months: Single Family Home                                      Prior Living Arrangements/Services Living arrangements for the past 2 months: Single Family Home Lives with:: Spouse, Other (Comment) (Spouse disabled son) Patient language and need for interpreter reviewed:: Yes Do you feel safe going back to the place where you live?: Yes      Need for Family Participation in Patient Care: Yes (Comment) Care giver support system in place?: Yes (comment) Current home services: DME Criminal Activity/Legal Involvement Pertinent to Current Situation/Hospitalization: No - Comment as needed  Activities of Daily Living   ADL  Screening (condition at time of admission) Independently performs ADLs?: No Does the patient have a NEW difficulty with bathing/dressing/toileting/self-feeding that is expected to last >3 days?: No Does the patient have a NEW difficulty with getting in/out of bed, walking, or climbing stairs that is expected to last >3 days?: No Does the patient have a NEW difficulty with communication that is expected to last >3 days?: No Is the patient deaf or have difficulty hearing?: No Does the patient have difficulty seeing, even when wearing glasses/contacts?: No Does the patient have difficulty concentrating, remembering, or making decisions?: No  Permission Sought/Granted      Share Information with NAME: Gabreal     Permission granted to share info w Relationship: Patient     Emotional Assessment Appearance:: Appears stated age Attitude/Demeanor/Rapport: Engaged Affect (typically observed): Accepting, Appropriate Orientation: : Oriented to Self, Oriented to Place, Oriented to  Time, Oriented to Situation Alcohol / Substance Use: Not Applicable Psych Involvement: No (comment)  Admission diagnosis:  COPD exacerbation (HCC) [J44.1] Atrial fibrillation with rapid ventricular response (HCC) [I48.91] HCAP (healthcare-associated pneumonia) [J18.9] Sepsis with acute hypoxic respiratory failure without septic shock, due to unspecified organism (HCC) [A41.9, R65.20, J96.01] Patient Active Problem List   Diagnosis Date Noted   HCAP (healthcare-associated pneumonia) 05/28/2024   Severe sepsis (HCC) 05/28/2024   COPD exacerbation (HCC) 05/24/2024   LLL pneumonia 05/14/2024   Acute on chronic heart failure with preserved ejection  fraction (HCC) 05/13/2024   Obesity, Class I, BMI 30-34.9 05/10/2024   Demand ischemia (HCC) 05/09/2024   Acquired thrombophilia 05/02/2024   Primary hypertension 05/01/2024   Encounter for therapeutic drug monitoring 02/19/2024   Long term current use of anticoagulant  01/18/2024   Persistent atrial fibrillation (HCC) 12/28/2023   Atrial fibrillation with rapid ventricular response (HCC) 12/27/2023   Malignant neoplasm of lung (HCC) 12/27/2023   Dyspnea on exertion 12/26/2023   Lung cancer metastatic to bone (HCC) 12/26/2023   Former smoker 12/26/2023   Chronic obstructive pulmonary disease (HCC) 12/26/2023   Malignant neoplasm of upper lobe of right lung (HCC) 11/30/2023   Pulmonary nodules 11/21/2023   Mediastinal adenopathy 11/21/2023   Elevated PSA 04/10/2020   Pain in left hip 02/26/2020   Pain in right shoulder 02/26/2020   PCP:  Sheryle Carwin, MD Pharmacy:   [facility] [phone] - [place], [place] - [address] AT [facility] [address] [place] [address] Phone: [phone] Fax: [phone]     Social Drivers of Health (SDOH) Social History: SDOH Screenings   Food Insecurity: No Food Insecurity (05/28/2024)  Housing: Low Risk  (05/28/2024)  Transportation Needs: No Transportation Needs (05/28/2024)  Utilities: Not At Risk (05/28/2024)  Depression (PHQ2-9): Low Risk  (05/15/2024)  Social Connections: Moderately Isolated (05/28/2024)  Tobacco Use: Medium Risk (05/28/2024)   SDOH Interventions:     Readmission Risk Interventions    05/29/2024    1:41 PM 05/03/2024   10:26 AM  Readmission Risk Prevention Plan  Transportation Screening Complete Complete  HRI or Home Care Consult  Complete  Social Work Consult for Recovery Care Planning/Counseling  Complete  Palliative Care Screening  Not Applicable  Medication Review Oceanographer) Complete Complete  HRI or Home Care Consult Complete   SW Recovery Care/Counseling Consult Complete   Palliative Care Screening Not Applicable   Skilled Nursing Facility Not Applicable

## 2024-05-29 NOTE — Progress Notes (Addendum)
 PROGRESS NOTE    Jose Joseph  FMW:[id number] DOB: June 17, 1956 DOA: 05/28/2024 PCP: Sheryle Carwin, MD   Brief Narrative:  Jose Joseph is a 68 y.o. male with medical history significant of metastatic lung CA s/p XRT on oral therapy, PAF s/p unsuccessful DCCV 9/26 (though later converted on IV amio), on coumadin , COPD, recent admission for covid-19 with question of superimposed pneumonia, discharged 10/11 with augmentin , nebulizer, did not qualify for supplemental oxygen . He returned to the ED  due to respiratory distress. He was found to be hypoxic by EMS, given supplemental oxygen , escalating to NRB, and in the ED required BiPAP. Solumedrol and continuous nebs given. Also given diltiazem  for AFib with RVR with no appreciable improvement in rate.  Initiated on amiodarone .  Assessment & Plan:   Principal Problem:   HCAP (healthcare-associated pneumonia) Active Problems:   Atrial fibrillation with rapid ventricular response (HCC)   Demand ischemia (HCC)   Chronic obstructive pulmonary disease (HCC)   Malignant neoplasm of lung (HCC)   Long term current use of anticoagulant   Lung cancer metastatic to bone (HCC)   Severe sepsis (HCC)   Assessment and Plan: Sepsis due to HCAP in immunosuppressed host: Persistent leukocytosis, new infiltrates on chest x-ray on 10/14 compared to 10/10.  Lactic acid 2.7. SABRA  - Continue vancomycin /cefepime  pending blood cultures.  - Sputum culture if able to expectorate - Urine Ag's - Recheck lactic acid.  Acute hypoxic respiratory failure: Was only 89% with ambulation at discharge recently, so did not receive home oxygen  due to insurance rules. Now returning with respiratory distress and new infiltrates.  - Blood gas reassuring, appears to have been on NIPPV mostly for effort. He's speaking in full sentences now through the BiPAP, requesting we take it off, will wean to nasal cannula oxygen  for goal SpO2 >88% with normal work of breathing.     Covid-19 infection: Positive PCR 10/10, still positive.  - Maintain airborne/contact precautions. Given his immunocompromise, may expect a prolonged course/carrier state/isolation need. - Continue IV Solu-Medrol  40 twice daily   AECOPD:  - Continue solumedrol, scheduled and prn nebs (levalbuterol  in place of albuterol ) - Pulmonary hygiene - F/u with pulmonary after discharge.   Suspected superimposed CAP:  - Given immunocompromised status, elevated PCT, Neutrophilia above baseline, more focal opacity compared to prior, we will plan for 5 days abx   PAF with RVR: - Likely exacerbated by sepsis. -History of DC cardioversion in the past.  - Does not appear to tolerate rapid AFib well. Troponin very mildly elevated, BNP elevated 4200. - On amiodarone  drip per cardiology.  Will follow cardiology recommendations. (Scheduled Lopressor  added by cardiology) -Monitor INR daily, supratherapeutic 4.2 today -Monitor for any bleeding  Heart failure with preserved ejection fraction: BNP elevated 4200.  Received IV fluid on admission.  Will monitor fluid status closely.  Will DC IV fluids.    Lung CA with bony metastases, cancer-associated pain:  - Holding Tx while septic. Continue home opioid prn  Chronic anemia: Mild drop in hemoglobin noted to 10.5, likely from hydration, need to closely monitor for any bleeding.   Thrombocytopenia: ?due to sepsis  - Recheck a.m.   Advance Care Planning: Full code   Consults: Cardiology   Family Communication: None at bedside   Severity of Illness: The appropriate patient status for this patient is INPATIENT. Inpatient status is judged to be reasonable and necessary in order to provide the required intensity of service to ensure the patient's safety. The patient's presenting  symptoms, physical exam findings, and initial radiographic and laboratory data in the context of their chronic comorbidities is felt to place them at high risk for further clinical  deterioration. Furthermore, it is not anticipated that the patient will be medically stable for discharge from the hospital within 2 midnights of admission.    * I certify that at the point of admission it is my clinical judgment that the patient will require inpatient hospital care spanning beyond 2 midnights from the point of admission due to high intensity of service, high risk for further deterioration and high frequency of surveillance required.*    Subjective: Patient seen and examined at the bedside.  He reports feeling tired and not able to sleep last night.  Feels improved in regards to breathing.  He remains in A-fib with heart rate 110s 120s.  Vital signs are stable. supplemetal Oxygen  at 4 L/min.  Patient says he did not use BiPAP consistently at night.  Objective: Vitals:   05/29/24 0400 05/29/24 0450 05/29/24 0500 05/29/24 0738  BP: 128/65  121/72   Pulse: (!) 115  (!) 110   Resp: (!) 25  (!) 28   Temp:  97.7 F (36.5 C)  97.8 F (36.6 C)  TempSrc:  Oral  Oral  SpO2: 90%  95%   Weight:      Height:        Intake/Output Summary (Last 24 hours) at 05/29/2024 0753 Last data filed at 05/29/2024 0448 Gross per 24 hour  Intake 2798.18 ml  Output 750 ml  Net 2048.18 ml   Filed Weights   05/28/24 0919  Weight: 116 kg    Examination:  General: Acutely ill looking, not in any acute distress Chest: Mild wheezing bilaterally, no crackles CVS: S1, S2, no murmur, regular irregular rhythm, tachycardia Abdomen: Soft, nontender Extremities: No edema    Data Reviewed: I have personally reviewed following labs and imaging studies  CBC: Recent Labs  Lab 05/24/24 1026 05/25/24 0416 05/28/24 0920 05/29/24 0446  WBC 23.0* 21.6* 30.7* 29.3*  NEUTROABS 17.3*  --  24.0*  --   HGB 12.5* 11.6* 13.4 10.7*  HCT 40.0 37.9* 43.0 35.4*  MCV 90.5 92.2 92.5 96.5  PLT 229 197 142* 110*   Basic Metabolic Panel: Recent Labs  Lab 05/24/24 1026 05/25/24 0416 05/28/24 0920  05/29/24 0446  NA 141 140 140 143  K 4.0 3.7 3.9 3.9  CL 102 104 103 107  CO2 24 25 23 26   GLUCOSE 112* 107* 120* 112*  BUN 41* 37* 41* 37*  CREATININE 1.40* 1.24 1.26* 1.38*  CALCIUM 9.6 9.6 10.2 9.4   GFR: Estimated Creatinine Clearance: 66.4 mL/min (A) (by C-G formula based on SCr of 1.38 mg/dL (H)). Liver Function Tests: Recent Labs  Lab 05/24/24 1026 05/25/24 0416 05/28/24 0956  AST 33 25 31  ALT 19 17 23   ALKPHOS 135* 119 143*  BILITOT 0.3 <0.2 0.2  PROT 6.7 6.4* 6.5  ALBUMIN  3.1* 3.1* 3.0*   No results for input(s): LIPASE, AMYLASE in the last 168 hours. No results for input(s): AMMONIA in the last 168 hours. Coagulation Profile: Recent Labs  Lab 05/24/24 1026 05/25/24 0416 05/28/24 0920 05/29/24 0446  INR 3.1* 2.5* 3.0* 4.2*   Cardiac Enzymes: No results for input(s): CKTOTAL, CKMB, CKMBINDEX, TROPONINI in the last 168 hours. BNP (last 3 results) Recent Labs    05/28/24 0920  PROBNP 4,264.0*   HbA1C: No results for input(s): HGBA1C in the last 72 hours. CBG: Recent  Labs  Lab 05/28/24 0913  GLUCAP 118*   Lipid Profile: No results for input(s): CHOL, HDL, LDLCALC, TRIG, CHOLHDL, LDLDIRECT in the last 72 hours. Thyroid Function Tests: No results for input(s): TSH, T4TOTAL, FREET4, T3FREE, THYROIDAB in the last 72 hours. Anemia Panel: No results for input(s): VITAMINB12, FOLATE, FERRITIN, TIBC, IRON, RETICCTPCT in the last 72 hours. Sepsis Labs: Recent Labs  Lab 05/24/24 1026 05/24/24 1037 05/24/24 1222 05/28/24 0920 05/28/24 1053  PROCALCITON 0.27  --   --   --   --   LATICACIDVEN  --  2.1* 1.7 2.7* 2.7*    Recent Results (from the past 240 hours)  Resp panel by RT-PCR (RSV, Flu A&B, Covid) Anterior Nasal Swab     Status: Abnormal   Collection Time: 05/24/24 10:56 AM   Specimen: Anterior Nasal Swab  Result Value Ref Range Status   SARS Coronavirus 2 by RT PCR POSITIVE (A) NEGATIVE Final     Comment: (NOTE) SARS-CoV-2 target nucleic acids are DETECTED.  The SARS-CoV-2 RNA is generally detectable in upper respiratory specimens during the acute phase of infection. Positive results are indicative of the presence of the identified virus, but do not rule out bacterial infection or co-infection with Jose pathogens not detected by the test. Clinical correlation with patient history and Jose diagnostic information is necessary to determine patient infection status. The expected result is Negative.  Fact Sheet for Patients: BloggerCourse.com  Fact Sheet for Healthcare Providers: SeriousBroker.it  This test is not yet approved or cleared by the [place]  FDA and  has been authorized for detection and/or diagnosis of SARS-CoV-2 by FDA under an Emergency Use Authorization (EUA).  This EUA will remain in effect (meaning this test can be used) for the duration of  the COVID-19 declaration under Section 564(b)(1) of the A ct, 21 U.S.C. section 360bbb-3(b)(1), unless the authorization is terminated or revoked sooner.     Influenza A by PCR NEGATIVE NEGATIVE Final   Influenza B by PCR NEGATIVE NEGATIVE Final    Comment: (NOTE) The Xpert Xpress SARS-CoV-2/FLU/RSV plus assay is intended as an aid in the diagnosis of influenza from Nasopharyngeal swab specimens and should not be used as a sole basis for treatment. Nasal washings and aspirates are unacceptable for Xpert Xpress SARS-CoV-2/FLU/RSV testing.  Fact Sheet for Patients: BloggerCourse.com  Fact Sheet for Healthcare Providers: SeriousBroker.it  This test is not yet approved or cleared by the [place]  FDA and has been authorized for detection and/or diagnosis of SARS-CoV-2 by FDA under an Emergency Use Authorization (EUA). This EUA will remain in effect (meaning this test can be used) for the duration of  the COVID-19 declaration under Section 564(b)(1) of the Act, 21 U.S.C. section 360bbb-3(b)(1), unless the authorization is terminated or revoked.     Resp Syncytial Virus by PCR NEGATIVE NEGATIVE Final    Comment: (NOTE) Fact Sheet for Patients: BloggerCourse.com  Fact Sheet for Healthcare Providers: SeriousBroker.it  This test is not yet approved or cleared by the [place]  FDA and has been authorized for detection and/or diagnosis of SARS-CoV-2 by FDA under an Emergency Use Authorization (EUA). This EUA will remain in effect (meaning this test can be used) for the duration of the COVID-19 declaration under Section 564(b)(1) of the Act, 21 U.S.C. section 360bbb-3(b)(1), unless the authorization is terminated or revoked.  Performed at [facility], [address] [place], [place] [address]   Resp panel by RT-PCR (RSV, Flu A&B, Covid) Anterior Nasal Swab     Status:  Abnormal   Collection Time: 05/28/24  9:20 AM   Specimen: Anterior Nasal Swab  Result Value Ref Range Status   SARS Coronavirus 2 by RT PCR POSITIVE (A) NEGATIVE Final    Comment: (NOTE) SARS-CoV-2 target nucleic acids are DETECTED.  The SARS-CoV-2 RNA is generally detectable in upper respiratory specimens during the acute phase of infection. Positive results are indicative of the presence of the identified virus, but do not rule out bacterial infection or co-infection with Jose pathogens not detected by the test. Clinical correlation with patient history and Jose diagnostic information is necessary to determine patient infection status. The expected result is Negative.  Fact Sheet for Patients: BloggerCourse.com  Fact Sheet for Healthcare Providers: SeriousBroker.it  This test is not yet approved or cleared by the [place]  FDA and  has been authorized for detection and/or diagnosis of SARS-CoV-2  by FDA under an Emergency Use Authorization (EUA).  This EUA will remain in effect (meaning this test can be used) for the duration of  the COVID-19 declaration under Section 564(b)(1) of the A ct, 21 U.S.C. section 360bbb-3(b)(1), unless the authorization is terminated or revoked sooner.     Influenza A by PCR NEGATIVE NEGATIVE Final   Influenza B by PCR NEGATIVE NEGATIVE Final    Comment: (NOTE) The Xpert Xpress SARS-CoV-2/FLU/RSV plus assay is intended as an aid in the diagnosis of influenza from Nasopharyngeal swab specimens and should not be used as a sole basis for treatment. Nasal washings and aspirates are unacceptable for Xpert Xpress SARS-CoV-2/FLU/RSV testing.  Fact Sheet for Patients: BloggerCourse.com  Fact Sheet for Healthcare Providers: SeriousBroker.it  This test is not yet approved or cleared by the [place]  FDA and has been authorized for detection and/or diagnosis of SARS-CoV-2 by FDA under an Emergency Use Authorization (EUA). This EUA will remain in effect (meaning this test can be used) for the duration of the COVID-19 declaration under Section 564(b)(1) of the Act, 21 U.S.C. section 360bbb-3(b)(1), unless the authorization is terminated or revoked.     Resp Syncytial Virus by PCR NEGATIVE NEGATIVE Final    Comment: (NOTE) Fact Sheet for Patients: BloggerCourse.com  Fact Sheet for Healthcare Providers: SeriousBroker.it  This test is not yet approved or cleared by the [place]  FDA and has been authorized for detection and/or diagnosis of SARS-CoV-2 by FDA under an Emergency Use Authorization (EUA). This EUA will remain in effect (meaning this test can be used) for the duration of the COVID-19 declaration under Section 564(b)(1) of the Act, 21 U.S.C. section 360bbb-3(b)(1), unless the authorization is terminated or revoked.  Performed at [facility], [address] [place], [place] [address]   Culture, blood (routine x 2)     Status: None (Preliminary result)   Collection Time: 05/28/24  9:20 AM   Specimen: BLOOD  Result Value Ref Range Status   Specimen Description BLOOD RIGHT ARM  Final   Special Requests   Final    BOTTLES DRAWN AEROBIC AND ANAEROBIC Blood Culture adequate volume   Culture   Final    NO GROWTH < 24 HOURS Performed at [facility], [address], [place], [place] [address]    Report Status PENDING  Incomplete  Culture, blood (routine x 2)     Status: None (Preliminary result)   Collection Time: 05/28/24  9:56 AM   Specimen: BLOOD  Result Value Ref Range Status   Specimen Description BLOOD BLOOD LEFT HAND  Final   Special Requests   Final  BOTTLES DRAWN AEROBIC AND ANAEROBIC Blood Culture adequate volume   Culture   Final    NO GROWTH < 24 HOURS Performed at [facility], [address], [place], [place] [address]    Report Status PENDING  Incomplete  MRSA Next Gen by PCR, Nasal     Status: None   Collection Time: 05/28/24 12:32 PM   Specimen: Nasal Mucosa; Nasal Swab  Result Value Ref Range Status   MRSA by PCR Next Gen NOT DETECTED NOT DETECTED Final    Comment: (NOTE) The GeneXpert MRSA Assay (FDA approved for NASAL specimens only), is one component of a comprehensive MRSA colonization surveillance program. It is not intended to diagnose MRSA infection nor to guide or monitor treatment for MRSA infections. Test performance is not FDA approved in patients less than 89 years old. Performed at [facility], [address] [place], [place] [address]          Radiology Studies: [facility] Chest [facility] 1 View Result Date: 05/28/2024 CLINICAL DATA:  Shortness of breath. Hypoxia. Positive COVID-19 test on 05/24/2024. EXAM: PORTABLE CHEST 1 VIEW COMPARISON:  05/24/2024 FINDINGS: Borderline enlarged cardiac silhouette. Increased patchy and consolidative density in the medial left lung base. Stable right  mid lung linear scarring. No airspace consolidation or pleural fluid. Right jugular porta catheter tip in the superior vena cava. Lower thoracic spine degenerative changes and mild bilateral glenohumeral degenerative changes. IMPRESSION: Increased patchy and consolidative density in the medial left lung base, compatible with pneumonia or atelectasis. Electronically Signed   By: Elspeth Bathe M.D.   On: 05/28/2024 10:22        Scheduled Meds:  Chlorhexidine  Gluconate Cloth  6 each Topical Q0600   feeding supplement  237 mL Oral BID BM   ipratropium  0.5 mg Nebulization Q6H   levalbuterol   0.63 mg Nebulization Q6H   methylPREDNISolone  (SOLU-MEDROL ) injection  40 mg Intravenous Q12H   sodium chloride  flush  3 mL Intravenous Q12H   tamsulosin   0.4 mg Oral Daily   Warfarin - Pharmacist Dosing Inpatient   Does not apply q1600   Continuous Infusions:  amiodarone  30 mg/hr (05/29/24 0304)   ceFEPime  (MAXIPIME ) IV 2 g (05/29/24 0549)   vancomycin             Derryl Duval, MD [facility] 05/29/2024, 7:53 AM

## 2024-05-29 NOTE — Progress Notes (Signed)
 Tele shows that patient converted to NSR around 1715. EKG completed. HR 104. MD made aware.

## 2024-05-30 ENCOUNTER — Inpatient Hospital Stay (HOSPITAL_COMMUNITY)

## 2024-05-30 ENCOUNTER — Ambulatory Visit: Attending: Student | Admitting: Student

## 2024-05-30 DIAGNOSIS — J189 Pneumonia, unspecified organism: Secondary | ICD-10-CM | POA: Diagnosis not present

## 2024-05-30 DIAGNOSIS — I480 Paroxysmal atrial fibrillation: Secondary | ICD-10-CM | POA: Diagnosis not present

## 2024-05-30 LAB — CBC
HCT: 35.8 % — ABNORMAL LOW (ref 39.0–52.0)
Hemoglobin: 10.4 g/dL — ABNORMAL LOW (ref 13.0–17.0)
MCH: 28.6 pg (ref 26.0–34.0)
MCHC: 29.1 g/dL — ABNORMAL LOW (ref 30.0–36.0)
MCV: 98.4 fL (ref 80.0–100.0)
Platelets: 112 10*3/uL — ABNORMAL LOW (ref 150–400)
RBC: 3.64 MIL/uL — ABNORMAL LOW (ref 4.22–5.81)
RDW: 14.6 % (ref 11.5–15.5)
WBC: 31.8 10*3/uL — ABNORMAL HIGH (ref 4.0–10.5)
nRBC: 0.1 % (ref 0.0–0.2)

## 2024-05-30 LAB — BASIC METABOLIC PANEL WITH GFR
Anion gap: 9 (ref 5–15)
BUN: 39 mg/dL — ABNORMAL HIGH (ref 8–23)
CO2: 29 mmol/L (ref 22–32)
Calcium: 10 mg/dL (ref 8.9–10.3)
Chloride: 107 mmol/L (ref 98–111)
Creatinine, Ser: 1.47 mg/dL — ABNORMAL HIGH (ref 0.61–1.24)
GFR, Estimated: 51 mL/min — ABNORMAL LOW (ref >60)
Glucose, Bld: 122 mg/dL — ABNORMAL HIGH (ref 70–99)
Potassium: 4 mmol/L (ref 3.5–5.1)
Sodium: 145 mmol/L (ref 135–145)

## 2024-05-30 LAB — LEGIONELLA PNEUMOPHILA SEROGP 1 UR AG: L. pneumophila Serogp 1 Ur Ag: NEGATIVE

## 2024-05-30 LAB — LACTATE DEHYDROGENASE: LDH: 194 U/L — ABNORMAL HIGH (ref 98–192)

## 2024-05-30 LAB — PROTIME-INR
INR: 3.4 — ABNORMAL HIGH (ref 0.8–1.2)
Prothrombin Time: 35.7 s — ABNORMAL HIGH (ref 11.4–15.2)

## 2024-05-30 LAB — PROCALCITONIN: Procalcitonin: 0.31 ng/mL

## 2024-05-30 LAB — C-REACTIVE PROTEIN: CRP: 17 mg/dL — ABNORMAL HIGH (ref <1.0)

## 2024-05-30 MED ORDER — VITAMIN K1 10 MG/ML IJ SOLN
5.0000 mg | Freq: Once | INTRAVENOUS | Status: AC
  Administered 2024-05-30: 5 mg via INTRAVENOUS
  Filled 2024-05-30: qty 0.5

## 2024-05-30 MED ORDER — SODIUM CHLORIDE 0.9 % IV SOLN
100.0000 mg | Freq: Two times a day (BID) | INTRAVENOUS | Status: DC
  Administered 2024-05-30 – 2024-05-31 (×3): 100 mg via INTRAVENOUS
  Filled 2024-05-30 (×5): qty 100

## 2024-05-30 MED ORDER — FUROSEMIDE 10 MG/ML IJ SOLN
40.0000 mg | Freq: Once | INTRAMUSCULAR | Status: AC
  Administered 2024-05-30: 40 mg via INTRAVENOUS
  Filled 2024-05-30: qty 4

## 2024-05-30 MED ORDER — FUROSEMIDE 10 MG/ML IJ SOLN
40.0000 mg | Freq: Two times a day (BID) | INTRAMUSCULAR | Status: DC
  Administered 2024-05-30: 40 mg via INTRAVENOUS
  Filled 2024-05-30: qty 4

## 2024-05-30 NOTE — Plan of Care (Signed)

## 2024-05-30 NOTE — Plan of Care (Signed)
  Problem: Clinical Measurements: Goal: Respiratory complications will improve Outcome: Not Progressing Goal: Cardiovascular complication will be avoided Outcome: Progressing   Problem: Health Behavior/Discharge Planning: Goal: Ability to manage health-related needs will improve Outcome: Progressing   Problem: Education: Goal: Knowledge of General Education information will improve Description: Including pain rating scale, medication(s)/side effects and non-pharmacologic comfort measures Outcome: Progressing   Problem: Elimination: Goal: Will not experience complications related to urinary retention Outcome: Progressing

## 2024-05-30 NOTE — Progress Notes (Addendum)
 Rounding Note   Patient Name: Jose Joseph Date of Encounter: 05/30/2024  [facility] Cardiologist: Vishnu P Mallipeddi, MD   Subjective Denies any CP.  SOB continues to improve. He put out 1.9L yesterday and net +1.35L since admit  Scheduled Meds:  Chlorhexidine  Gluconate Cloth  6 each Topical Q0600   feeding supplement  237 mL Oral BID BM   furosemide   40 mg Intravenous Once   ipratropium  0.5 mg Nebulization Q6H   levalbuterol   0.63 mg Nebulization Q6H   melatonin  3 mg Oral QHS   methylPREDNISolone  (SOLU-MEDROL ) injection  40 mg Intravenous Q12H   metoprolol  tartrate  25 mg Oral QID   sodium chloride  flush  3 mL Intravenous Q12H   tamsulosin   0.4 mg Oral Daily   Warfarin - Pharmacist Dosing Inpatient   Does not apply q1600   Continuous Infusions:  amiodarone  30 mg/hr (05/30/24 0540)   ceFEPime  (MAXIPIME ) IV 200 mL/hr at 05/30/24 0540   vancomycin  Stopped (05/29/24 1208)   PRN Meds: acetaminophen  **OR** acetaminophen , alum & mag hydroxide-simeth, HYDROcodone-acetaminophen , levalbuterol , metoprolol  tartrate   Vital Signs  Vitals:   05/30/24 0802 05/30/24 0804 05/30/24 0824 05/30/24 0900  BP:   (!) 104/55 (!) 102/53  Pulse: 89  86 85  Resp: 20   (!) 26  Temp:      TempSrc:      SpO2: 91% 94%  92%  Weight:      Height:        Intake/Output Summary (Last 24 hours) at 05/30/2024 0918 Last data filed at 05/30/2024 0825 Gross per 24 hour  Intake 1257.61 ml  Output 1950 ml  Net -692.39 ml      05/28/2024    9:19 AM 05/24/2024   10:10 AM 05/22/2024    1:07 PM  Last 3 Weights  Weight (lbs) 255 lb 11.7 oz 255 lb 11.7 oz 256 lb  Weight (kg) 116 kg 116 kg 116.121 kg      Telemetry Afib with CVR - Personally Reviewed  ECG  N/a - Personally Reviewed  Physical Exam  Not completed as patient with active COVID 19  Labs High Sensitivity Troponin:   Recent Labs  Lab 05/01/24 1020 05/01/24 1231 05/09/24 0455 05/09/24 0642  TROPONINIHS 8 6  320* 452*     Chemistry Recent Labs  Lab 05/24/24 1026 05/25/24 0416 05/28/24 0920 05/28/24 0956 05/29/24 0446 05/30/24 0419  NA 141 140 140  --  143 145  K 4.0 3.7 3.9  --  3.9 4.0  CL 102 104 103  --  107 107  CO2 24 25 23   --  26 29  GLUCOSE 112* 107* 120*  --  112* 122*  BUN 41* 37* 41*  --  37* 39*  CREATININE 1.40* 1.24 1.26*  --  1.38* 1.47*  CALCIUM 9.6 9.6 10.2  --  9.4 10.0  PROT 6.7 6.4*  --  6.5  --   --   ALBUMIN  3.1* 3.1*  --  3.0*  --   --   AST 33 25  --  31  --   --   ALT 19 17  --  23  --   --   ALKPHOS 135* 119  --  143*  --   --   BILITOT 0.3 <0.2  --  0.2  --   --   GFRNONAA 54* >60 >60  --  55* 51*  ANIONGAP 16* 11 14  --  10 9  Lipids No results for input(s): CHOL, TRIG, HDL, LABVLDL, LDLCALC, CHOLHDL in the last 168 hours.  Hematology Recent Labs  Lab 05/28/24 0920 05/29/24 0446 05/30/24 0419  WBC 30.7* 29.3* 31.8*  RBC 4.65 3.67* 3.64*  HGB 13.4 10.7* 10.4*  HCT 43.0 35.4* 35.8*  MCV 92.5 96.5 98.4  MCH 28.8 29.2 28.6  MCHC 31.2 30.2 29.1*  RDW 14.6 14.7 14.6  PLT 142* 110* 112*   Thyroid No results for input(s): TSH, FREET4 in the last 168 hours.  BNP Recent Labs  Lab 05/28/24 0920  PROBNP 4,264.0*    DDimer No results for input(s): DDIMER in the last 168 hours.   Radiology  DG CHEST PORT 1 VIEW Result Date: 05/29/2024 CLINICAL DATA:  Shortness of breath EXAM: PORTABLE CHEST 1 VIEW COMPARISON:  05/28/2024 FINDINGS: Right Port-A-Cath remains in place, unchanged. Platelike density in the right upper lobe, likely scarring, stable. Worsening airspace disease in the left perihilar and lower lobe regions. Possible small left effusion. No effusion on the right. Heart mediastinal contours within normal limits. No acute bony abnormality. IMPRESSION: Worsening left perihilar and lower lobe airspace disease concerning for pneumonia. Suspect small left effusion. Electronically Signed   By: Franky Crease M.D.   On: 05/29/2024  19:10   DG Chest Port 1 View Result Date: 05/28/2024 CLINICAL DATA:  Shortness of breath. Hypoxia. Positive COVID-19 test on 05/24/2024. EXAM: PORTABLE CHEST 1 VIEW COMPARISON:  05/24/2024 FINDINGS: Borderline enlarged cardiac silhouette. Increased patchy and consolidative density in the medial left lung base. Stable right mid lung linear scarring. No airspace consolidation or pleural fluid. Right jugular porta catheter tip in the superior vena cava. Lower thoracic spine degenerative changes and mild bilateral glenohumeral degenerative changes. IMPRESSION: Increased patchy and consolidative density in the medial left lung base, compatible with pneumonia or atelectasis. Electronically Signed   By: Elspeth Bathe M.D.   On: 05/28/2024 10:22    Cardiac Studies   Patient Profile   Jose Joseph is a 68 y.o. male with a hx of  PAF (initially diagnosed 12/2023 s/p DC DCCV/TEE in 12/2023, recurrent afib w/ successful DCCV 05/01/2024), HTN, COPD, stage IV lung cancer, prior CVA, chronic HFpEF who is being seen 05/28/2024 for the evaluation of afib with RVR at the request of Dr Bryn.   Assessment & Plan   1.PAF - initially diagnosed 12/2023, had TEE/DCCV at the time. Recurrent afib 05/01/24 in setting of SIRs Medical therapy has been limited by interactions with chemo agents. From 05/09/24 cardiology consult note was further reviewed with pharmacy, recs were avoid diltiazem  and DOACs, but other agents including amio acceptable. He was started on amidoarone during that admission. - 05/10/24 TEE/DCCV was unsuccesful while on amio. He later converted on his on IV amio later in that admission.  - this admit 05/28/24 recurrent afib with RVR in setting of severe pneumonia/sepsis. Started on IV amio in ER.  - remains in atrial fibrillation with HR in the 80's to low 100's on IV Amio at 30mg /hr - currently on Lopressor  25mg  q6 hours - coumadin  per pharmacy, cannot be on DOACs due to medication interactions with  chemo agents   2. Chronic HFpEF - Receiving IVFs in setting of sepsis protocol, monitor fluid status closely.  - SCr 1.47, K+ 4   3. Sepsis/HCAPCOPD exacerbation/Recent covid 05/24/24 - per primary team  4. Lung cancer - per Oncology   For questions or updates, please contact [facility] Please consult [email or web address] for contact info under  Signed, Wilbert Bihari, MD  05/30/2024, 9:18 AM

## 2024-05-30 NOTE — Consult Note (Signed)
 TELE-PCCM CONSULT This consult was provided via telemedicine with  audio communication with physician  NAME:  [name], MRN:  [id number], DOB:  [birth year], LOS: 2 ADMISSION DATE:  05/28/2024, CONSULTATION DATE:  05/30/2024 REFERRING MD:  Dr Mcarthur, CHIEF COMPLAINT:  shortness of breath    History of Present Illness:  [name] is a 68 year old male with history of metastatic lung cancer status post radiation therapy currently on targeted therapy, atrial fibrillation, COPD who is admitted at [facility] for respiratory failure  Patient was recently admitted there for COVID 19 with questionable superimposed pneumonia and was discharged home on 05/25/2024 with antibiotics and nebulizers.  Did not require home oxygen  at discharge He was admitted again on 10/14 for worsening respiratory failure.  Patient was also in A-fib with RVR . required BiPAP initially, has been weaned down to 4 L nasal cannula this morning. Had leukocytosis at admission, proBNP over 4000  Pertinent  Medical History  Metastatic lung cancer A-fib COPD   Objective   Blood pressure (!) 120/54, pulse 93, temperature 97.7 F (36.5 C), temperature source Oral, resp. rate (!) 26, height 6' 1 (1.854 m), weight 116 kg, SpO2 100%.    FiO2 (%):  [70 %-100 %] 70 % PEEP:  [8 cmH20] 8 cmH20 Pressure Support:  [8 cmH20-16 cmH20] 8 cmH20   Intake/Output Summary (Last 24 hours) at 05/30/2024 1736 Last data filed at 05/30/2024 1635 Gross per 24 hour  Intake 1257.61 ml  Output 3000 ml  Net -1742.39 ml   Filed Weights   05/28/24 0919  Weight: 116 kg    Examination: Not performed as patient was not seen during this tele consult   Assessment & Plan:   Acute hypoxic respiratory failure History of metastatic lung cancer to bone-status post radiation, currently on targeted therapy A-fib with RVR Decompensated heart failure with preserved EF Left pleural effusion Copd AKI   - Based on history, I agree  with empiric broad-spectrum antibiotic coverage.  Agree with adding atypical coverage - Patient has been on steroid for COPD history and severe community-acquired pneumonia.  Can continue - Chest x-ray shows new effusion on the left compared to his prior admission.  CT chest is planned.  Expect left effusion and draining effusion would help hypoxia. -Given A-fib with RVR, history of heart failure with preserved EF and significantly elevated BNP, patient may benefit from aggressive diuresis as tolerated    CBC: Recent Labs  Lab 05/24/24 1026 05/25/24 0416 05/28/24 0920 05/29/24 0446 05/30/24 0419  WBC 23.0* 21.6* 30.7* 29.3* 31.8*  NEUTROABS 17.3*  --  24.0*  --   --   HGB 12.5* 11.6* 13.4 10.7* 10.4*  HCT 40.0 37.9* 43.0 35.4* 35.8*  MCV 90.5 92.2 92.5 96.5 98.4  PLT 229 197 142* 110* 112*    Basic Metabolic Panel: Recent Labs  Lab 05/24/24 1026 05/25/24 0416 05/28/24 0920 05/29/24 0446 05/30/24 0419  NA 141 140 140 143 145  K 4.0 3.7 3.9 3.9 4.0  CL 102 104 103 107 107  CO2 24 25 23 26 29   GLUCOSE 112* 107* 120* 112* 122*  BUN 41* 37* 41* 37* 39*  CREATININE 1.40* 1.24 1.26* 1.38* 1.47*  CALCIUM 9.6 9.6 10.2 9.4 10.0   GFR: Estimated Creatinine Clearance: 62.4 mL/min (A) (by C-G formula based on SCr of 1.47 mg/dL (H)). Recent Labs  Lab 05/24/24 1026 05/24/24 1037 05/24/24 1222 05/25/24 0416 05/28/24 0920 05/28/24 1053 05/29/24 0446 05/29/24 0807 05/30/24 0419  PROCALCITON 0.27  --   --   --   --   --   --   --  0.31  WBC 23.0*  --   --  21.6* 30.7*  --  29.3*  --  31.8*  LATICACIDVEN  --    < > 1.7  --  2.7* 2.7*  --  1.8  --    < > = values in this interval not displayed.    Liver Function Tests: Recent Labs  Lab 05/24/24 1026 05/25/24 0416 05/28/24 0956  AST 33 25 31  ALT 19 17 23   ALKPHOS 135* 119 143*  BILITOT 0.3 <0.2 0.2  PROT 6.7 6.4* 6.5  ALBUMIN  3.1* 3.1* 3.0*   No results for input(s): LIPASE, AMYLASE in the last 168 hours. No  results for input(s): AMMONIA in the last 168 hours.  ABG    Component Value Date/Time   HCO3 27.2 05/28/2024 0920   O2SAT 79.3 05/28/2024 0920     Coagulation Profile: Recent Labs  Lab 05/24/24 1026 05/25/24 0416 05/28/24 0920 05/29/24 0446 05/30/24 0419  INR 3.1* 2.5* 3.0* 4.2* 3.4*    Cardiac Enzymes: No results for input(s): CKTOTAL, CKMB, CKMBINDEX, TROPONINI in the last 168 hours.  HbA1C: Hgb A1c MFr Bld  Date/Time Value Ref Range Status  05/09/2024 11:35 AM 5.2 4.8 - 5.6 % Final    Comment:    (NOTE) Diagnosis of Diabetes The following HbA1c ranges recommended by the American Diabetes Association (ADA) may be used as an aid in the diagnosis of diabetes mellitus.  Hemoglobin             Suggested A1C NGSP%              Diagnosis  <5.7                   Non Diabetic  5.7-6.4                Pre-Diabetic  >6.4                   Diabetic  <7.0                   Glycemic control for                       adults with diabetes.      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   Surgical History:   Past Surgical History:  Procedure Laterality Date   BRONCHIAL BIOPSY  11/21/2023   Procedure: BRONCHOSCOPY, WITH BIOPSY;  Surgeon: Shelah Lamar RAMAN, MD;  Location: [facility] ENDOSCOPY;  Service: Pulmonary;;   BRONCHIAL NEEDLE ASPIRATION BIOPSY  11/21/2023   Procedure: BRONCHOSCOPY, WITH NEEDLE ASPIRATION BIOPSY;  Surgeon: Shelah Lamar RAMAN, MD;  Location: [facility] ENDOSCOPY;   Service: Pulmonary;;   CARDIOVERSION N/A 01/01/2024   Procedure: CARDIOVERSION;  Surgeon: Francyne Headland, MD;  Location: [facility] INVASIVE CV LAB;  Service: Cardiovascular;  Laterality: N/A;   CARDIOVERSION N/A 05/10/2024   Procedure: CARDIOVERSION;  Surgeon: Rolan Ezra RAMAN, MD;  Location: [facility] INVASIVE CV LAB;  Service: Cardiovascular;  Laterality: N/A;   COLONOSCOPY WITH PROPOFOL  N/A 01/19/2023   Procedure: COLONOSCOPY WITH PROPOFOL ;  Surgeon: Shaaron Lamar HERO, MD;  Location: [facility] ENDO SUITE;  Service: Endoscopy;  Laterality: N/A;  200pm, asa 2   ENDOBRONCHIAL ULTRASOUND Bilateral 11/21/2023   Procedure: ENDOBRONCHIAL ULTRASOUND (EBUS);  Surgeon: Shelah Lamar RAMAN, MD;  Location: [facility] ENDOSCOPY;  Service: Pulmonary;  Laterality: Bilateral;   HERNIA REPAIR     IR IMAGING GUIDED PORT INSERTION  12/14/2023   IR THORACENTESIS ASP PLEURAL SPACE W/IMG GUIDE  12/28/2023   KNEE ARTHROCENTESIS     NECK SURGERY     POLYPECTOMY  01/19/2023   Procedure: POLYPECTOMY;  Surgeon: Shaaron Lamar HERO, MD;  Location: [facility] ENDO SUITE;  Service: Endoscopy;;   TOTAL HIP ARTHROPLASTY Left 01/15/2021   Procedure: LEFT TOTAL HIP ARTHROPLASTY ANTERIOR APPROACH;  Surgeon: Barbarann Oneil BROCKS, MD;  Location: [facility] OR;  Service: Orthopedics;  Laterality: Left;   TRANSESOPHAGEAL ECHOCARDIOGRAM (CATH LAB) N/A 01/01/2024   Procedure: TRANSESOPHAGEAL ECHOCARDIOGRAM;  Surgeon: Francyne Headland, MD;  Location: [facility] INVASIVE CV LAB;  Service: Cardiovascular;  Laterality: N/A;   TRANSESOPHAGEAL ECHOCARDIOGRAM (CATH LAB) N/A 05/10/2024   Procedure: TRANSESOPHAGEAL ECHOCARDIOGRAM;  Surgeon: Rolan Ezra RAMAN, MD;  Location: [facility] INVASIVE CV LAB;  Service: Cardiovascular;  Laterality: N/A;   VIDEO BRONCHOSCOPY WITH ENDOBRONCHIAL NAVIGATION Bilateral 11/21/2023   Procedure: VIDEO BRONCHOSCOPY WITH ENDOBRONCHIAL NAVIGATION;  Surgeon: Shelah Lamar RAMAN, MD;  Location: [facility] ENDOSCOPY;  Service: Pulmonary;  Laterality: Bilateral;   WRIST ARTHROPLASTY       Social History:    reports that he quit smoking about 11 years ago. His smoking use included cigarettes. He started smoking about 46 years ago. He has a 35 pack-year smoking history. He has been exposed to tobacco smoke. He has never used smokeless tobacco. He reports that he does not currently use alcohol after a past usage of about 2.0 standard drinks of alcohol per week. He reports current drug use. Drug: Marijuana.   Family History:  His family history includes Kidney cancer in his sister; Prostate cancer in his brother.   Allergies Allergies  Allergen Reactions   Bee Venom Anaphylaxis    syncope     Home Medications  Prior to Admission medications   Medication Sig Start Date End Date Taking? Authorizing Provider  albuterol  (ACCUNEB ) 0.63 MG/3ML nebulizer solution Take 3 mLs (0.63 mg total) by nebulization every 6 (six) hours as needed for wheezing. 05/25/24  Yes Bryn Bernardino NOVAK, MD  albuterol  (VENTOLIN  HFA) 108 [phone] Base) MCG/ACT inhaler Inhale 1 puff into the lungs every 4 (four) hours as needed for wheezing. 01/02/24 01/01/25 Yes Tobie Yetta HERO, MD  amiodarone  (PACERONE ) 200 MG tablet Take 1 tablet (200 mg total) by mouth 2 (two) times daily for 6 days, THEN 1 tablet (200 mg total) daily. 05/14/24 06/19/24 Yes Laurence Locus, DO  binimetinib  (MEKTOVI ) 15 MG tablet Take 3 tablets (45 mg total) by mouth 2 (two) times daily. 05/14/24  Yes Kandala, Hyndavi, MD  budesonide -glycopyrrolate -formoterol  (BREZTRI  AEROSPHERE) 160-9-4.8 MCG/ACT AERO inhaler Inhale 2 puffs into the lungs in the morning and at bedtime. 01/02/24  Yes Tobie Yetta HERO, MD  dapagliflozin propanediol (FARXIGA) 10 MG TABS tablet Take 1 tablet (10 mg total) by mouth daily. 05/15/24 06/14/24 Yes Laurence Locus, DO  encorafenib  (BRAFTOVI ) 75 MG capsule Take 6 capsules (450 mg total) by mouth daily. 05/14/24  Yes Davonna Siad, MD  EPINEPHrine  0.3 mg/0.3 mL IJ SOAJ injection Inject 0.3 mg into the muscle as needed for anaphylaxis. 02/14/24  Yes [provider]  HYDROcodone-acetaminophen  (NORCO/VICODIN) 5-325 MG tablet Take 1 tablet  by mouth every 6 (six) hours as needed. 05/17/24  Yes [provider]  metoprolol  succinate (TOPROL -XL) 100 MG 24 hr tablet Take 1 tablet (100 mg total) by mouth daily. Take with or immediately following a meal. 05/15/24 06/14/24 Yes Laurence Locus, DO  predniSONE  (DELTASONE ) 20 MG tablet Take 2 tablets (40 mg total) by mouth daily with breakfast for 4 days. 05/26/24 05/30/24 Yes Bryn Bernardino NOVAK, MD  prochlorperazine  (COMPAZINE ) 10 MG tablet TAKE 1 TABLET(10 MG) BY MOUTH EVERY 6 HOURS AS NEEDED 02/23/24  Yes Rogers Hai, MD  tamsulosin  (FLOMAX ) 0.4 MG CAPS capsule Take 1 capsule (0.4 mg total) by mouth daily. 05/15/24 06/14/24 Yes Laurence Locus, DO  warfarin (COUMADIN ) 10 MG tablet Or as directed by coumadin  clinic.  Take 10 mg M, F and 5 mg all other days Patient taking differently: Or as directed by coumadin  clinic. Take 1/2 tablet (5mg ) daily unitl next clinic visit 05/14/24  Yes Laurence Locus, DO  Oxycodone  HCl 10 MG TABS Take 1 tablet (10 mg total) by mouth every 4 (four) hours as needed for severe pain (pain score 7-10). Patient not taking: Reported on 05/28/2024 05/14/24   Laurence Locus, DO       Yasheka Fossett Pleas, MD 05/30/24 5:36 PM [facility]  For contact information, see Amion. If no response to pager, please call PCCM consult pager. After hours, 7PM- 7AM, please call Elink.

## 2024-05-30 NOTE — Progress Notes (Addendum)
 PROGRESS NOTE    Jose Joseph  FMW:[id number] DOB: Oct 28, 1955 DOA: 05/28/2024 PCP: Sheryle Carwin, MD   Brief Narrative:  Jose Joseph is a 68 y.o. male with medical history significant of metastatic lung CA s/p XRT on immunotherapy, PAF s/p unsuccessful DCCV 9/26 (though later converted on IV amio), on coumadin , COPD, multiple recent hospitalization for cardiopulmonary issues, recent admission for COVID-19 with question of superimposed pneumonia, discharged 10/11 with augmentin , nebulizer, did not qualify for supplemental oxygen . He returned to the ED  due to respiratory distress. He was found to be hypoxic by EMS, given supplemental oxygen , escalating to NRB, and in the ED required BiPAP. Solumedrol and continuous nebs given. Also given diltiazem  for AFib with RVR with no appreciable improvement in rate.  Initiated on amiodarone  drip with improvement in heart rate.  Patient unfortunately has worsening pulmonary infiltrates on chest x-ray with tachypnea/worsening hypoxia requiring BiPAP 10/15 night.  He is switched back to nasal cannula 10/16.  Assessment & Plan:   Principal Problem:   HCAP (healthcare-associated pneumonia) Active Problems:   Atrial fibrillation with rapid ventricular response (HCC)   Demand ischemia (HCC)   Chronic obstructive pulmonary disease (HCC)   Malignant neoplasm of lung (HCC)   Long term current use of anticoagulant   Lung cancer metastatic to bone (HCC)   Severe sepsis (HCC)   Assessment and Plan:    Sepsis due to HCAP in immunosuppressed host:  Persistent leukocytosis, new infiltrates on chest x-ray on 10/14 compared to 10/10.  Lactic acid 2.7 has cleared. .  Chest x-ray on 10/15 with worsening infiltrates especially on the left side with pleural effusion. - Will continue vancomycin , cefepime .  Doxycycline would be added for broad coverage.  Procalcitonin is negative.  Leukocytosis persistently elevated likely exacerbated by steroids. Blood culture  negative for 48 hours.  Acute hypoxic respiratory failure: Covid-19 infection: Positive PCR 10/10, Bilateral pulmonary infiltrates Worsening left-sided infiltrates with pleural effusion 10/15  - Maintain airborne/contact precautions. Given his immunocompromise, may expect a prolonged course/carrier state/isolation need. - Continue IV Solu-Medrol  40 twice daily -Worsening infiltrate especially in the left side 10/15.  Immunotherapy induced pneumonitis is on the differential but unlikely.  Repeat chest x-ray ordered today but has not been completed yet.  Also CT scan without contrast ordered today and that has not been completed yet. -IR will be consulted for potential thoracentesis on the left side pending above imaging. -Will diurese with IV Lasix  40 mg twice daily (patient did receive IV fluid resuscitation on admission probably contributing to his respiratory status) -Consulted pulmonology.  Discussed with Dr. Pleas   AECOPD:  - Continue solumedrol, scheduled and prn nebs (levalbuterol  in place of albuterol ) - Pulmonary hygiene - F/u with pulmonary after discharge.    PAF with RVR: - Likely exacerbated by sepsis. -History of DC cardioversion in the past.  - Does not appear to tolerate rapid AFib well. Troponin very mildly elevated, BNP elevated 4200. - On amiodarone  drip per cardiology.  Rate appears to be under better control.  Will follow cardiology recommendations. (Scheduled Lopressor  added by cardiology) -Will diurese as above. -Monitor INR daily, INR 3.4 today. (Will give vitamin K today to facilitate thoracentesis tomorrow) -Monitor for any bleeding  Heart failure with preserved ejection fraction: BNP elevated 4200.  Received IV fluid on admission.  Will monitor fluid status closely.  IV diuresis as above with close monitoring of electrolytes and kidney function.  Lung CA with bony metastases, cancer-associated pain:  - Holding Tx while  septic. Continue home opioid  prn  Chronic anemia: Mild drop in hemoglobin noted to 10.5, likely from hydration, need to closely monitor for any bleeding.   Thrombocytopenia: ?due to sepsis  - Recheck a.m.   Advance Care Planning: Full code   Consults: Cardiology   Family Communication: None at bedside   Severity of Illness: The appropriate patient status for this patient is INPATIENT. Inpatient status is judged to be reasonable and necessary in order to provide the required intensity of service to ensure the patient's safety. The patient's presenting symptoms, physical exam findings, and initial radiographic and laboratory data in the context of their chronic comorbidities is felt to place them at high risk for further clinical deterioration. Furthermore, it is not anticipated that the patient will be medically stable for discharge from the hospital within 2 midnights of admission.    * I certify that at the point of admission it is my clinical judgment that the patient will require inpatient hospital care spanning beyond 2 midnights from the point of admission due to high intensity of service, high risk for further deterioration and high frequency of surveillance required.*    Subjective: Patient seen and examined at the bedside.  Overnight events reviewed.  Patient became tachypneic in the evening yesterday and was placed on BiPAP, required FiO2 of 70%.  This morning he is weaned down to nasal cannula.  He is mildly tachypneic.  Able to answer basic questions.  He did receive a dose of IV Lasix  40 mg which seems to help him. Afebrile.  Objective: Vitals:   05/30/24 0802 05/30/24 0804 05/30/24 0824 05/30/24 0900  BP:   (!) 104/55 (!) 102/53  Pulse: 89  86 85  Resp: 20   (!) 26  Temp:      TempSrc:      SpO2: 91% 94%  92%  Weight:      Height:        Intake/Output Summary (Last 24 hours) at 05/30/2024 1202 Last data filed at 05/30/2024 0825 Gross per 24 hour  Intake 1257.61 ml  Output 1950 ml  Net  -692.39 ml   Filed Weights   05/28/24 0919  Weight: 116 kg    Examination:  General: Acutely ill looking, tachypneic, mild lethargic, however arousable and can answer basic questions chest: Mild wheezing bilaterally, no crackles CVS: S1, S2, no murmur, regular irregular rhythm, tachycardia Abdomen: Soft, nontender Extremities: No edema    Data Reviewed: I have personally reviewed following labs and imaging studies  CBC: Recent Labs  Lab 05/24/24 1026 05/25/24 0416 05/28/24 0920 05/29/24 0446 05/30/24 0419  WBC 23.0* 21.6* 30.7* 29.3* 31.8*  NEUTROABS 17.3*  --  24.0*  --   --   HGB 12.5* 11.6* 13.4 10.7* 10.4*  HCT 40.0 37.9* 43.0 35.4* 35.8*  MCV 90.5 92.2 92.5 96.5 98.4  PLT 229 197 142* 110* 112*   Basic Metabolic Panel: Recent Labs  Lab 05/24/24 1026 05/25/24 0416 05/28/24 0920 05/29/24 0446 05/30/24 0419  NA 141 140 140 143 145  K 4.0 3.7 3.9 3.9 4.0  CL 102 104 103 107 107  CO2 24 25 23 26 29   GLUCOSE 112* 107* 120* 112* 122*  BUN 41* 37* 41* 37* 39*  CREATININE 1.40* 1.24 1.26* 1.38* 1.47*  CALCIUM 9.6 9.6 10.2 9.4 10.0   GFR: Estimated Creatinine Clearance: 62.4 mL/min (A) (by C-G formula based on SCr of 1.47 mg/dL (H)). Liver Function Tests: Recent Labs  Lab 05/24/24 1026 05/25/24 0416 05/28/24  0956  AST 33 25 31  ALT 19 17 23   ALKPHOS 135* 119 143*  BILITOT 0.3 <0.2 0.2  PROT 6.7 6.4* 6.5  ALBUMIN  3.1* 3.1* 3.0*   No results for input(s): LIPASE, AMYLASE in the last 168 hours. No results for input(s): AMMONIA in the last 168 hours. Coagulation Profile: Recent Labs  Lab 05/24/24 1026 05/25/24 0416 05/28/24 0920 05/29/24 0446 05/30/24 0419  INR 3.1* 2.5* 3.0* 4.2* 3.4*   Cardiac Enzymes: No results for input(s): CKTOTAL, CKMB, CKMBINDEX, TROPONINI in the last 168 hours. BNP (last 3 results) Recent Labs    05/28/24 0920  PROBNP 4,264.0*   HbA1C: No results for input(s): HGBA1C in the last 72  hours. CBG: Recent Labs  Lab 05/28/24 0913  GLUCAP 118*   Lipid Profile: No results for input(s): CHOL, HDL, LDLCALC, TRIG, CHOLHDL, LDLDIRECT in the last 72 hours. Thyroid Function Tests: No results for input(s): TSH, T4TOTAL, FREET4, T3FREE, THYROIDAB in the last 72 hours. Anemia Panel: No results for input(s): VITAMINB12, FOLATE, FERRITIN, TIBC, IRON, RETICCTPCT in the last 72 hours. Sepsis Labs: Recent Labs  Lab 05/24/24 1026 05/24/24 1037 05/24/24 1222 05/28/24 0920 05/28/24 1053 05/29/24 0807 05/30/24 0419  PROCALCITON 0.27  --   --   --   --   --  0.31  LATICACIDVEN  --    < > 1.7 2.7* 2.7* 1.8  --    < > = values in this interval not displayed.    Recent Results (from the past 240 hours)  Resp panel by RT-PCR (RSV, Flu A&B, Covid) Anterior Nasal Swab     Status: Abnormal   Collection Time: 05/24/24 10:56 AM   Specimen: Anterior Nasal Swab  Result Value Ref Range Status   SARS Coronavirus 2 by RT PCR POSITIVE (A) NEGATIVE Final    Comment: (NOTE) SARS-CoV-2 target nucleic acids are DETECTED.  The SARS-CoV-2 RNA is generally detectable in upper respiratory specimens during the acute phase of infection. Positive results are indicative of the presence of the identified virus, but do not rule out bacterial infection or co-infection with other pathogens not detected by the test. Clinical correlation with patient history and other diagnostic information is necessary to determine patient infection status. The expected result is Negative.  Fact Sheet for Patients: BloggerCourse.com  Fact Sheet for Healthcare Providers: SeriousBroker.it  This test is not yet approved or cleared by the [place]  FDA and  has been authorized for detection and/or diagnosis of SARS-CoV-2 by FDA under an Emergency Use Authorization (EUA).  This EUA will remain in effect (meaning this test can be used)  for the duration of  the COVID-19 declaration under Section 564(b)(1) of the A ct, 21 U.S.C. section 360bbb-3(b)(1), unless the authorization is terminated or revoked sooner.     Influenza A by PCR NEGATIVE NEGATIVE Final   Influenza B by PCR NEGATIVE NEGATIVE Final    Comment: (NOTE) The Xpert Xpress SARS-CoV-2/FLU/RSV plus assay is intended as an aid in the diagnosis of influenza from Nasopharyngeal swab specimens and should not be used as a sole basis for treatment. Nasal washings and aspirates are unacceptable for Xpert Xpress SARS-CoV-2/FLU/RSV testing.  Fact Sheet for Patients: BloggerCourse.com  Fact Sheet for Healthcare Providers: SeriousBroker.it  This test is not yet approved or cleared by the [place]  FDA and has been authorized for detection and/or diagnosis of SARS-CoV-2 by FDA under an Emergency Use Authorization (EUA). This EUA will remain in effect (meaning this test can be used) for the  duration of the COVID-19 declaration under Section 564(b)(1) of the Act, 21 U.S.C. section 360bbb-3(b)(1), unless the authorization is terminated or revoked.     Resp Syncytial Virus by PCR NEGATIVE NEGATIVE Final    Comment: (NOTE) Fact Sheet for Patients: BloggerCourse.com  Fact Sheet for Healthcare Providers: SeriousBroker.it  This test is not yet approved or cleared by the [place]  FDA and has been authorized for detection and/or diagnosis of SARS-CoV-2 by FDA under an Emergency Use Authorization (EUA). This EUA will remain in effect (meaning this test can be used) for the duration of the COVID-19 declaration under Section 564(b)(1) of the Act, 21 U.S.C. section 360bbb-3(b)(1), unless the authorization is terminated or revoked.  Performed at [facility], [address], [place], [place] [address]   Resp panel by RT-PCR (RSV, Flu A&B, Covid) Anterior Nasal Swab      Status: Abnormal   Collection Time: 05/28/24  9:20 AM   Specimen: Anterior Nasal Swab  Result Value Ref Range Status   SARS Coronavirus 2 by RT PCR POSITIVE (A) NEGATIVE Final    Comment: (NOTE) SARS-CoV-2 target nucleic acids are DETECTED.  The SARS-CoV-2 RNA is generally detectable in upper respiratory specimens during the acute phase of infection. Positive results are indicative of the presence of the identified virus, but do not rule out bacterial infection or co-infection with other pathogens not detected by the test. Clinical correlation with patient history and other diagnostic information is necessary to determine patient infection status. The expected result is Negative.  Fact Sheet for Patients: BloggerCourse.com  Fact Sheet for Healthcare Providers: SeriousBroker.it  This test is not yet approved or cleared by the [place]  FDA and  has been authorized for detection and/or diagnosis of SARS-CoV-2 by FDA under an Emergency Use Authorization (EUA).  This EUA will remain in effect (meaning this test can be used) for the duration of  the COVID-19 declaration under Section 564(b)(1) of the A ct, 21 U.S.C. section 360bbb-3(b)(1), unless the authorization is terminated or revoked sooner.     Influenza A by PCR NEGATIVE NEGATIVE Final   Influenza B by PCR NEGATIVE NEGATIVE Final    Comment: (NOTE) The Xpert Xpress SARS-CoV-2/FLU/RSV plus assay is intended as an aid in the diagnosis of influenza from Nasopharyngeal swab specimens and should not be used as a sole basis for treatment. Nasal washings and aspirates are unacceptable for Xpert Xpress SARS-CoV-2/FLU/RSV testing.  Fact Sheet for Patients: BloggerCourse.com  Fact Sheet for Healthcare Providers: SeriousBroker.it  This test is not yet approved or cleared by the [place]  FDA and has been authorized for  detection and/or diagnosis of SARS-CoV-2 by FDA under an Emergency Use Authorization (EUA). This EUA will remain in effect (meaning this test can be used) for the duration of the COVID-19 declaration under Section 564(b)(1) of the Act, 21 U.S.C. section 360bbb-3(b)(1), unless the authorization is terminated or revoked.     Resp Syncytial Virus by PCR NEGATIVE NEGATIVE Final    Comment: (NOTE) Fact Sheet for Patients: BloggerCourse.com  Fact Sheet for Healthcare Providers: SeriousBroker.it  This test is not yet approved or cleared by the [place]  FDA and has been authorized for detection and/or diagnosis of SARS-CoV-2 by FDA under an Emergency Use Authorization (EUA). This EUA will remain in effect (meaning this test can be used) for the duration of the COVID-19 declaration under Section 564(b)(1) of the Act, 21 U.S.C. section 360bbb-3(b)(1), unless the authorization is terminated or revoked.  Performed at [facility], [address] [place], [place] [address]   Culture, blood (routine x 2)     Status: None (Preliminary result)   Collection Time: 05/28/24  9:20 AM   Specimen: BLOOD  Result Value Ref Range Status   Specimen Description BLOOD RIGHT ARM  Final   Special Requests   Final    BOTTLES DRAWN AEROBIC AND ANAEROBIC Blood Culture adequate volume   Culture   Final    NO GROWTH 2 DAYS Performed at [facility], [address] [place], [place] [address]    Report Status PENDING  Incomplete  Culture, blood (routine x 2)     Status: None (Preliminary result)   Collection Time: 05/28/24  9:56 AM   Specimen: BLOOD  Result Value Ref Range Status   Specimen Description BLOOD BLOOD LEFT HAND  Final   Special Requests   Final    BOTTLES DRAWN AEROBIC AND ANAEROBIC Blood Culture adequate volume   Culture   Final    NO GROWTH 2 DAYS Performed at [facility], [address] [place], [place] [address]    Report Status  PENDING  Incomplete  MRSA Next Gen by PCR, Nasal     Status: None   Collection Time: 05/28/24 12:32 PM   Specimen: Nasal Mucosa; Nasal Swab  Result Value Ref Range Status   MRSA by PCR Next Gen NOT DETECTED NOT DETECTED Final    Comment: (NOTE) The GeneXpert MRSA Assay (FDA approved for NASAL specimens only), is one component of a comprehensive MRSA colonization surveillance program. It is not intended to diagnose MRSA infection nor to guide or monitor treatment for MRSA infections. Test performance is not FDA approved in patients less than 71 years old. Performed at [facility], [address], [place], [place] [address]          Radiology Studies: DG CHEST PORT 1 VIEW Result Date: 05/29/2024 CLINICAL DATA:  Shortness of breath EXAM: PORTABLE CHEST 1 VIEW COMPARISON:  05/28/2024 FINDINGS: Right Port-A-Cath remains in place, unchanged. Platelike density in the right upper lobe, likely scarring, stable. Worsening airspace disease in the left perihilar and lower lobe regions. Possible small left effusion. No effusion on the right. Heart mediastinal contours within normal limits. No acute bony abnormality. IMPRESSION: Worsening left perihilar and lower lobe airspace disease concerning for pneumonia. Suspect small left effusion. Electronically Signed   By: Franky Crease M.D.   On: 05/29/2024 19:10        Scheduled Meds:  Chlorhexidine  Gluconate Cloth  6 each Topical Q0600   feeding supplement  237 mL Oral BID BM   furosemide   40 mg Intravenous BID   ipratropium  0.5 mg Nebulization Q6H   levalbuterol   0.63 mg Nebulization Q6H   melatonin  3 mg Oral QHS   methylPREDNISolone  (SOLU-MEDROL ) injection  40 mg Intravenous Q12H   metoprolol  tartrate  25 mg Oral QID   sodium chloride  flush  3 mL Intravenous Q12H   tamsulosin   0.4 mg Oral Daily   Warfarin - Pharmacist Dosing Inpatient   Does not apply q1600   Continuous Infusions:  amiodarone  30 mg/hr (05/30/24 0540)   ceFEPime   (MAXIPIME ) IV 200 mL/hr at 05/30/24 0540   doxycycline (VIBRAMYCIN) IV     vancomycin  1,500 mg (05/30/24 0925)          Derryl Duval, MD [facility] 05/30/2024, 12:02 PM

## 2024-05-30 NOTE — Progress Notes (Signed)
 PHARMACY - ANTICOAGULATION CONSULT NOTE  Pharmacy Consult for warfarin Indication: atrial fibrillation  Allergies  Allergen Reactions   Bee Venom Anaphylaxis    syncope    Patient Measurements: Height: 6' 1 (185.4 cm) Weight: 116 kg (255 lb 11.7 oz) IBW/kg (Calculated) : 79.9 HEPARIN  DW (KG): 104.7  Vital Signs: Temp: 97.7 F (36.5 C) (10/16 0527) Temp Source: Oral (10/16 0527) BP: 102/53 (10/16 0900) Pulse Rate: 85 (10/16 0900)  Labs: Recent Labs    05/28/24 0920 05/29/24 0446 05/30/24 0419  HGB 13.4 10.7* 10.4*  HCT 43.0 35.4* 35.8*  PLT 142* 110* 112*  LABPROT 32.3* 42.2* 35.7*  INR 3.0* 4.2* 3.4*  CREATININE 1.26* 1.38* 1.47*    Estimated Creatinine Clearance: 62.4 mL/min (A) (by C-G formula based on SCr of 1.47 mg/dL (H)).   Medical History: Past Medical History:  Diagnosis Date   Arthritis    Asthma    Atrial fibrillation (HCC)    Cancer (HCC)    Complication of anesthesia    after his neck surgery pt. 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 male with a history of metastatic lung CA s/p XRT on oral therapy,  paroxysmal AFib s/p DCCV 9/26 on coumadin , COPD, hx CVA who presented to the ED by EMS today due to respiratory distress. Pharmacy asked to dose coumadin . Patient recently discharged on 10/11 on 5mg  of warfarin daily. INR on admit was 3.0, low dose given 10/14 and INR trended up to above 4. Current INR back down to 3.4. Will hold warfarin for one more day to allow trend back within normal range.    Goal of Therapy:  INR 2-3 Monitor platelets by anticoagulation protocol: Yes    Plan:  Hold warfarin for today Daily INR  Dempsey Blush PharmD., BCPS Clinical Pharmacist 05/30/2024 12:03 PM

## 2024-05-31 ENCOUNTER — Inpatient Hospital Stay (HOSPITAL_COMMUNITY)

## 2024-05-31 ENCOUNTER — Encounter: Payer: Self-pay | Admitting: Student

## 2024-05-31 DIAGNOSIS — J189 Pneumonia, unspecified organism: Secondary | ICD-10-CM | POA: Diagnosis not present

## 2024-05-31 DIAGNOSIS — I480 Paroxysmal atrial fibrillation: Secondary | ICD-10-CM | POA: Diagnosis not present

## 2024-05-31 LAB — CBC WITH DIFFERENTIAL/PLATELET
Abs Immature Granulocytes: 1.6 K/uL — ABNORMAL HIGH (ref 0.00–0.07)
Basophils Absolute: 0.1 K/uL (ref 0.0–0.1)
Basophils Relative: 0 %
Eosinophils Absolute: 0.4 K/uL (ref 0.0–0.5)
Eosinophils Relative: 1 %
HCT: 36.1 % — ABNORMAL LOW (ref 39.0–52.0)
Hemoglobin: 10.4 g/dL — ABNORMAL LOW (ref 13.0–17.0)
Immature Granulocytes: 5 %
Lymphocytes Relative: 2 %
Lymphs Abs: 0.9 K/uL (ref 0.7–4.0)
MCH: 28.7 pg (ref 26.0–34.0)
MCHC: 28.8 g/dL — ABNORMAL LOW (ref 30.0–36.0)
MCV: 99.7 fL (ref 80.0–100.0)
Monocytes Absolute: 2.8 K/uL — ABNORMAL HIGH (ref 0.1–1.0)
Monocytes Relative: 8 %
Neutro Abs: 30.2 K/uL — ABNORMAL HIGH (ref 1.7–7.7)
Neutrophils Relative %: 84 %
Platelets: 118 K/uL — ABNORMAL LOW (ref 150–400)
RBC: 3.62 MIL/uL — ABNORMAL LOW (ref 4.22–5.81)
RDW: 14.6 % (ref 11.5–15.5)
WBC: 35.9 K/uL — ABNORMAL HIGH (ref 4.0–10.5)
nRBC: 0.1 % (ref 0.0–0.2)

## 2024-05-31 LAB — COMPREHENSIVE METABOLIC PANEL WITH GFR
ALT: 22 U/L (ref 0–44)
AST: 21 U/L (ref 15–41)
Albumin: 3 g/dL — ABNORMAL LOW (ref 3.5–5.0)
Alkaline Phosphatase: 137 U/L — ABNORMAL HIGH (ref 38–126)
Anion gap: 11 (ref 5–15)
BUN: 50 mg/dL — ABNORMAL HIGH (ref 8–23)
CO2: 28 mmol/L (ref 22–32)
Calcium: 10 mg/dL (ref 8.9–10.3)
Chloride: 104 mmol/L (ref 98–111)
Creatinine, Ser: 1.98 mg/dL — ABNORMAL HIGH (ref 0.61–1.24)
GFR, Estimated: 36 mL/min — ABNORMAL LOW (ref >60)
Glucose, Bld: 135 mg/dL — ABNORMAL HIGH (ref 70–99)
Potassium: 4.4 mmol/L (ref 3.5–5.1)
Sodium: 142 mmol/L (ref 135–145)
Total Bilirubin: 0.2 mg/dL (ref 0.0–1.2)
Total Protein: 6.4 g/dL — ABNORMAL LOW (ref 6.5–8.1)

## 2024-05-31 LAB — BLOOD GAS, ARTERIAL
Acid-Base Excess: 0.4 mmol/L (ref 0.0–2.0)
Bicarbonate: 29.4 mmol/L — ABNORMAL HIGH (ref 20.0–28.0)
Drawn by: 37588
O2 Saturation: 100 %
Patient temperature: 36.6
pCO2 arterial: 66 mmHg (ref 32–48)
pH, Arterial: 7.26 — ABNORMAL LOW (ref 7.35–7.45)
pO2, Arterial: 134 mmHg — ABNORMAL HIGH (ref 83–108)

## 2024-05-31 LAB — PROTIME-INR
INR: 1.6 — ABNORMAL HIGH (ref 0.8–1.2)
Prothrombin Time: 19.9 s — ABNORMAL HIGH (ref 11.4–15.2)

## 2024-05-31 MED ORDER — LIDOCAINE HCL (PF) 2 % IJ SOLN
INTRAMUSCULAR | Status: AC
  Filled 2024-05-31: qty 10

## 2024-05-31 MED ORDER — ONDANSETRON HCL 4 MG/2ML IJ SOLN: 4.0000 mg | Freq: Four times a day (QID) | INTRAMUSCULAR | Status: DC | PRN

## 2024-05-31 MED ORDER — FENTANYL 2500MCG IN NS 250ML (10MCG/ML) PREMIX INFUSION
0.0000 ug/h | INTRAVENOUS | Status: DC
  Administered 2024-05-31: 50 ug/h via INTRAVENOUS
  Filled 2024-05-31 (×2): qty 250

## 2024-05-31 MED ORDER — POLYVINYL ALCOHOL 1.4 % OP SOLN: 1.0000 [drp] | Freq: Four times a day (QID) | OPHTHALMIC | Status: DC | PRN

## 2024-05-31 MED ORDER — GLYCOPYRROLATE 0.2 MG/ML IJ SOLN: 0.2000 mg | INTRAMUSCULAR | Status: DC | PRN

## 2024-05-31 MED ORDER — ACETAMINOPHEN 325 MG PO TABS: 650.0000 mg | ORAL_TABLET | Freq: Four times a day (QID) | ORAL | Status: DC | PRN

## 2024-05-31 MED ORDER — LORAZEPAM 2 MG/ML IJ SOLN
2.0000 mg | INTRAMUSCULAR | Status: DC | PRN
  Administered 2024-05-31: 2 mg via INTRAVENOUS
  Filled 2024-05-31: qty 1

## 2024-05-31 MED ORDER — ACETAMINOPHEN 650 MG RE SUPP: 650.0000 mg | Freq: Four times a day (QID) | RECTAL | Status: DC | PRN

## 2024-05-31 MED ORDER — ONDANSETRON 4 MG PO TBDP: 4.0000 mg | ORAL_TABLET | Freq: Four times a day (QID) | ORAL | Status: DC | PRN

## 2024-05-31 MED ORDER — GLYCOPYRROLATE 1 MG PO TABS: 1.0000 mg | ORAL_TABLET | ORAL | Status: DC | PRN

## 2024-05-31 MED ORDER — FENTANYL BOLUS VIA INFUSION: 100.0000 ug | INTRAVENOUS | Status: DC | PRN

## 2024-05-31 MED ORDER — SODIUM CHLORIDE 0.9 % IV SOLN: INTRAVENOUS | Status: DC

## 2024-06-02 LAB — CULTURE, BLOOD (ROUTINE X 2)
Culture: NO GROWTH
Culture: NO GROWTH
Special Requests: ADEQUATE
Special Requests: ADEQUATE

## 2024-06-10 ENCOUNTER — Ambulatory Visit (HOSPITAL_COMMUNITY)

## 2024-06-10 ENCOUNTER — Inpatient Hospital Stay

## 2024-06-13 ENCOUNTER — Other Ambulatory Visit: Payer: Self-pay

## 2024-06-13 NOTE — Progress Notes (Signed)
 Patient deceased. Disenrolling.

## 2024-06-15 NOTE — Progress Notes (Signed)
 Rounding Note   Patient Name: Jose Joseph Date of Encounter: 06-19-2024  [facility] Cardiologist: Diannah SHAUNNA Maywood, MD   Subjective Some delirium this am In NSR   Scheduled Meds:  Chlorhexidine  Gluconate Cloth  6 each Topical Q0600   feeding supplement  237 mL Oral BID BM   ipratropium  0.5 mg Nebulization Q6H   levalbuterol   0.63 mg Nebulization Q6H   melatonin  3 mg Oral QHS   methylPREDNISolone  (SOLU-MEDROL ) injection  40 mg Intravenous Q12H   metoprolol  tartrate  25 mg Oral QID   sodium chloride  flush  3 mL Intravenous Q12H   tamsulosin   0.4 mg Oral Daily   Warfarin - Pharmacist Dosing Inpatient   Does not apply q1600   Continuous Infusions:  amiodarone  30 mg/hr (06/19/2024 0522)   ceFEPime  (MAXIPIME ) IV 2 g (19-Jun-2024 9391)   doxycycline (VIBRAMYCIN) IV Stopped (06/19/24 0229)   vancomycin  Stopped (05/30/24 1125)   PRN Meds: acetaminophen  **OR** acetaminophen , alum & mag hydroxide-simeth, HYDROcodone-acetaminophen , levalbuterol , metoprolol  tartrate   Vital Signs  Vitals:   Jun 19, 2024 0200 06-19-24 0303 06-19-24 0357 06-19-2024 0534  BP: (!) 110/58 (!) 102/58    Pulse: 88 (!) 32 87   Resp: (!) 30 (!) 31 (!) 28   Temp:      TempSrc:      SpO2: 100% (!) 86% 98% 100%  Weight:      Height:        Intake/Output Summary (Last 24 hours) at 06-19-2024 0746 Last data filed at 06/19/2024 0522 Gross per 24 hour  Intake 1501.89 ml  Output 1200 ml  Net 301.89 ml      05/28/2024    9:19 AM 05/24/2024   10:10 AM 05/22/2024    1:07 PM  Last 3 Weights  Weight (lbs) 255 lb 11.7 oz 255 lb 11.7 oz 256 lb  Weight (kg) 116 kg 116 kg 116.121 kg      Telemetry Afib with CVR - Personally Reviewed  ECG  N/a - Personally Reviewed  Physical Exam  Port right subclavian Diffuse rhonchi No murmur Plus one edema  Labs High Sensitivity Troponin:   Recent Labs  Lab 05/01/24 1020 05/01/24 1231 05/09/24 0455 05/09/24 0642  TROPONINIHS 8 6 320* 452*      Chemistry Recent Labs  Lab 05/25/24 0416 05/28/24 0920 05/28/24 0956 05/29/24 0446 05/30/24 0419 Jun 19, 2024 0331  NA 140   < >  --  143 145 142  K 3.7   < >  --  3.9 4.0 4.4  CL 104   < >  --  107 107 104  CO2 25   < >  --  26 29 28   GLUCOSE 107*   < >  --  112* 122* 135*  BUN 37*   < >  --  37* 39* 50*  CREATININE 1.24   < >  --  1.38* 1.47* 1.98*  CALCIUM 9.6   < >  --  9.4 10.0 10.0  PROT 6.4*  --  6.5  --   --  6.4*  ALBUMIN  3.1*  --  3.0*  --   --  3.0*  AST 25  --  31  --   --  21  ALT 17  --  23  --   --  22  ALKPHOS 119  --  143*  --   --  137*  BILITOT <0.2  --  0.2  --   --  0.2  GFRNONAA >60   < >  --  55* 51* 36*  ANIONGAP 11   < >  --  10 9 11    < > = values in this interval not displayed.    Lipids No results for input(s): CHOL, TRIG, HDL, LABVLDL, LDLCALC, CHOLHDL in the last 168 hours.  Hematology Recent Labs  Lab 05/29/24 0446 05/30/24 0419 06-11-2024 0331  WBC 29.3* 31.8* 35.9*  RBC 3.67* 3.64* 3.62*  HGB 10.7* 10.4* 10.4*  HCT 35.4* 35.8* 36.1*  MCV 96.5 98.4 99.7  MCH 29.2 28.6 28.7  MCHC 30.2 29.1* 28.8*  RDW 14.7 14.6 14.6  PLT 110* 112* 118*   Thyroid No results for input(s): TSH, FREET4 in the last 168 hours.  BNP Recent Labs  Lab 05/28/24 0920  PROBNP 4,264.0*    DDimer No results for input(s): DDIMER in the last 168 hours.   Radiology  DG CHEST PORT 1 VIEW Result Date: 05/30/2024 CLINICAL DATA:  Hypoxia. EXAM: PORTABLE CHEST 1 VIEW COMPARISON:  Chest CT 05/30/2024.  Chest radiograph 05/29/2024 FINDINGS: Low lung volumes with opacities at the left lung base compatible with volume loss and pleural fluid. Bandlike densities in the right lung are suggestive for atelectasis. Heart size is grossly stable. Lytic lesion with bone destruction along the inferior aspect of the right scapula. There is also lucency in the expected region of the right posterior second rib compatible with a lytic bone lesion. Negative for  pneumothorax. IMPRESSION: 1. Low lung volumes with left basilar chest densities. Findings are suggestive for left pleural effusion and volume loss. 2. Lucent bone lesions involving the right scapula and right second rib. Findings are compatible with metastatic disease. Electronically Signed   By: Juliene Balder M.D.   On: 05/30/2024 15:21   CT CHEST WO CONTRAST Result Date: 05/30/2024 CLINICAL DATA:  Pneumonia, complications suspected. EXAM: CT CHEST WITHOUT CONTRAST TECHNIQUE: Multidetector CT imaging of the chest was performed following the standard protocol without IV contrast. RADIATION DOSE REDUCTION: This exam was performed according to the departmental dose-optimization program which includes automated exposure control, adjustment of the mA and/or kV according to patient size and/or use of iterative reconstruction technique. COMPARISON:  03/11/2024 FINDINGS: Cardiovascular: Heart size is stable. Small amount of pericardial fluid. Right jugular Port-A-Cath with the tip near the superior cavoatrial junction. Mediastinum/Nodes: Compared to the exam on 03/11/2024, patient has developed multiple small lymph nodes in the anterior mediastinum and in the right cardiophrenic region. Conglomeration of nodal tissue in the right cardiophrenic area measures 1.7 cm on image 102/2. New significant lymphadenopathy in the right axillary and right sub pectoralis region. Enlarged supraclavicular lymphadenopathy bilaterally. Esophagus is poorly characterized. Lungs/Pleura: Bilateral pleural effusions, left side greater than right. Left pleural effusion is moderate in size. Right pleural effusion is small. Complete collapse or consolidation in the left lower lobe. Medial consolidation / volume loss in the left upper lobe. Limited evaluation of the lungs due to motion artifact. Volume loss in the right lower lobe. Again noted is a small nodule at the right lung base on image 108/3 but poorly characterized due to motion artifact.  New patchy peripheral opacities in the right lung apex. There may be new small subpleural nodular densities in the right lung on image 57/3. Upper Abdomen: There is significant artifact in the abdomen but there is concern for multiple new liver lesions. There may be a small amount of perihepatic ascites. New fullness in the left renal pelvis and there is probably at least mild left hydronephrosis which is incompletely evaluated. Lymph node enlargement  in the gastrohepatic ligament. Musculoskeletal: New soft tissue mass involving the posterior right second rib with destruction of the posterior rib. Soft tissue structure measures 6.0 x 4.3 cm image 28, sequence 2. Lytic lesions involving the right scapula and right coracoid process. Again noted are sclerotic lesions involving the manubrium. However, there is a new lytic lesion along the anterior aspect of the manubrium on image 48/2. Again noted are sclerotic lesions involving the sternum. New lytic lesion along the posterior vertebral bodies at T1 and T3. Old sclerotic lesions T7 and T9. New or enlarging lucent lesion involving T9. Probable new lesion involving the T10 vertebral body. Additional small lucent lesions in the spine. IMPRESSION: 1. Progression of metastatic disease throughout the chest and upper abdomen. This represents a significant progression since 03/11/2024. 2. Patient has developed multiple new lytic bone lesions including a large destructive lesion involving the posterior right second rib. Large lytic lesion involving the T1 vertebral body that could be extending into the central canal. 3. Extensive lymphadenopathy in the chest, right axilla and upper abdomen. Evidence for multiple liver lesions. 4. New left hydronephrosis that is incompletely imaged. 5. Bilateral pleural effusions, left side larger than right. Complete collapse or consolidation of the left lower lobe. Volume loss in the right lung. 6. Patchy densities in the right lung are  nonspecific and some of this could be metastatic. These results will be called to the ordering clinician or representative by the Radiologist Assistant, and communication documented in the PACS or [employer]. Electronically Signed   By: Juliene Balder M.D.   On: 05/30/2024 15:17   DG CHEST PORT 1 VIEW Result Date: 05/29/2024 CLINICAL DATA:  Shortness of breath EXAM: PORTABLE CHEST 1 VIEW COMPARISON:  05/28/2024 FINDINGS: Right Port-A-Cath remains in place, unchanged. Platelike density in the right upper lobe, likely scarring, stable. Worsening airspace disease in the left perihilar and lower lobe regions. Possible small left effusion. No effusion on the right. Heart mediastinal contours within normal limits. No acute bony abnormality. IMPRESSION: Worsening left perihilar and lower lobe airspace disease concerning for pneumonia. Suspect small left effusion. Electronically Signed   By: Franky Crease M.D.   On: 05/29/2024 19:10    Cardiac Studies   Patient Profile   Ajani Rineer is a 68 y.o. male with a hx of  PAF (initially diagnosed 12/2023 s/p DC DCCV/TEE in 12/2023, recurrent afib w/ successful DCCV 05/01/2024), HTN, COPD, stage IV lung cancer, prior CVA, chronic HFpEF who is being seen 05/28/2024 for the evaluation of afib with RVR at the request of Dr Bryn.   Assessment & Plan   1.PAF - initially diagnosed 12/2023, had TEE/DCCV at the time. Recurrent afib 05/01/24 in setting of SIRs -amiodarone  off now but in NSR  - coumadin  per pharmacy, cannot be on DOACs due to medication interactions with chemo agents  INR 1.6 Cover with heparin  while INR subRx   2. Chronic HFpEF - Receiving IVFs in setting of sepsis protocol, monitor fluid status closely.  - SCr 1.47, K+ 4   3. Sepsis/HCAPCOPD exacerbation/Recent covid 05/24/24 - per primary team  4. Lung cancer - per Oncology - CT scan of chest looks horrible with widely worsening mets - Palliative consult, hospice and DNR are in order     For questions or updates, please contact [facility] Please consult [email or web address] for contact info under    Signed, Maude Emmer, MD  06/09/24, 7:46 AM

## 2024-06-15 NOTE — Progress Notes (Signed)
  Extensive discussion with family at the bedside.  Discussed with wife as well as son.  Discussed current situation, especially progression of cancer and continued respiratory failure and continued intermittent A-fib RVR with not much improvement in overall status.  Discussed options including continuing aggressive care as we have been doing versus comfort care.  They have requested comfort care.  Orders placed.  Will start IV fentanyl  drip, as needed Ativan, Robinul  as needed.

## 2024-06-15 NOTE — Progress Notes (Signed)
 of fentanyl  drip wasted with two RN verification which includes Editor, commissioning.

## 2024-06-15 NOTE — Death Summary Note (Signed)
 Death Summary  Jose Joseph FMW:[id number] DOB: Mar 21, 1956 DOA: 09-Jun-2024  PCP: Sheryle Carwin, MD  Admit date: 2024/06/09 Date of Death: 2024-06-12 Time of Death: 23-Jun-1202    History of present illness:   Jose Joseph is a 68 y.o. male with medical history significant of metastatic lung CA s/p XRT on immunotherapy, PAF s/p unsuccessful DCCV 9/26 (though later converted on IV amio), on coumadin , COPD, multiple recent hospitalization for cardiopulmonary issues, recent admission for COVID-19 with question of superimposed pneumonia, discharged 10/11 with augmentin , nebulizer, did not qualify for supplemental oxygen . He returned to the ED on 06/09/2024 due to respiratory distress. He was found to be hypoxic by EMS, given supplemental oxygen , escalating to NRB, and in the ED required BiPAP.   He was managed as HCAP with COPD exacerbation in the setting of recent COVID-19 infection.  Chest x-ray revealed bilateral pulmonary infiltrates especially on the left side.  He was also in A-fib RVR managed with IV amiodarone  with cardiology following.  His respiratory status did not improve much.  He was able to transition to nasal cannula however would go back to BiPAP intermittently with increased work of breathing.  CT scan of the chest was completed on 05/30/2024 which revealed significant progression of metastatic lung cancer with multiple lesions in the lung, left lower lung collapse along with bilateral pleural effusion, lymphadenopathy and widespread metastatic osseous lesions.  Goals of care discussed with patient's wife and son.  After extensive discussion, they requested comfort care on 2024/06/12.  Patient passed away on the same day at 1203 hrs.   Final Diagnoses:   Acute hypoxic respiratory failure Recent COVID-19 infection Progressive stage IV lung cancer Bilateral pleural effusion COPD with exacerbation Paroxysmal A-fib with RVR Heart failure with preserved ejection fraction Chronic  anemia Thrombocytopenia  The results of significant diagnostics from this hospitalization (including imaging, microbiology, ancillary and laboratory) are listed below for reference.    Significant Diagnostic Studies: DG CHEST PORT 1 VIEW Result Date: 06-12-2024 EXAM: 1 VIEW XRAY OF THE CHEST Jun 12, 2024 08:01:00 AM COMPARISON: Comparison yesterday. CLINICAL HISTORY: [id number] Acute hypoxic respiratory failure (HCC) [id number]. Respiratory distress, hx pna. FINDINGS: LINES, TUBES AND DEVICES: Right internal jugular port-a-cath is unchanged. LUNGS AND PLEURA: Stable left pleural effusion with associated atelectasis. Stable right mid lung subsegmental atelectasis. No pulmonary edema. No pneumothorax. HEART AND MEDIASTINUM: Stable cardiomediastinal silhouette. BONES AND SOFT TISSUES: Destructive lesion in right scapula is again noted consistent with metastatic lesion. IMPRESSION: 1. Stable left pleural effusion with associated atelectasis, and stable right mid lung subsegmental atelectasis. 2. Destructive right scapular lesion consistent with metastatic disease. Electronically signed by: Lynwood Seip MD 06-12-24 08:09 AM EDT RP Workstation: [id number]   DG CHEST PORT 1 VIEW Result Date: 05/30/2024 CLINICAL DATA:  Hypoxia. EXAM: PORTABLE CHEST 1 VIEW COMPARISON:  Chest CT 05/30/2024.  Chest radiograph 05/29/2024 FINDINGS: Low lung volumes with opacities at the left lung base compatible with volume loss and pleural fluid. Bandlike densities in the right lung are suggestive for atelectasis. Heart size is grossly stable. Lytic lesion with bone destruction along the inferior aspect of the right scapula. There is also lucency in the expected region of the right posterior second rib compatible with a lytic bone lesion. Negative for pneumothorax. IMPRESSION: 1. Low lung volumes with left basilar chest densities. Findings are suggestive for left pleural effusion and volume loss. 2. Lucent bone lesions involving the right  scapula and right second rib. Findings are compatible with metastatic disease. Electronically Signed  By: Juliene Balder M.D.   On: 05/30/2024 15:21   CT CHEST WO CONTRAST Result Date: 05/30/2024 CLINICAL DATA:  Pneumonia, complications suspected. EXAM: CT CHEST WITHOUT CONTRAST TECHNIQUE: Multidetector CT imaging of the chest was performed following the standard protocol without IV contrast. RADIATION DOSE REDUCTION: This exam was performed according to the departmental dose-optimization program which includes automated exposure control, adjustment of the mA and/or kV according to patient size and/or use of iterative reconstruction technique. COMPARISON:  03/11/2024 FINDINGS: Cardiovascular: Heart size is stable. Small amount of pericardial fluid. Right jugular Port-A-Cath with the tip near the superior cavoatrial junction. Mediastinum/Nodes: Compared to the exam on 03/11/2024, patient has developed multiple small lymph nodes in the anterior mediastinum and in the right cardiophrenic region. Conglomeration of nodal tissue in the right cardiophrenic area measures 1.7 cm on image 102/2. New significant lymphadenopathy in the right axillary and right sub pectoralis region. Enlarged supraclavicular lymphadenopathy bilaterally. Esophagus is poorly characterized. Lungs/Pleura: Bilateral pleural effusions, left side greater than right. Left pleural effusion is moderate in size. Right pleural effusion is small. Complete collapse or consolidation in the left lower lobe. Medial consolidation / volume loss in the left upper lobe. Limited evaluation of the lungs due to motion artifact. Volume loss in the right lower lobe. Again noted is a small nodule at the right lung base on image 108/3 but poorly characterized due to motion artifact. New patchy peripheral opacities in the right lung apex. There may be new small subpleural nodular densities in the right lung on image 57/3. Upper Abdomen: There is significant artifact in the  abdomen but there is concern for multiple new liver lesions. There may be a small amount of perihepatic ascites. New fullness in the left renal pelvis and there is probably at least mild left hydronephrosis which is incompletely evaluated. Lymph node enlargement in the gastrohepatic ligament. Musculoskeletal: New soft tissue mass involving the posterior right second rib with destruction of the posterior rib. Soft tissue structure measures 6.0 x 4.3 cm image 28, sequence 2. Lytic lesions involving the right scapula and right coracoid process. Again noted are sclerotic lesions involving the manubrium. However, there is a new lytic lesion along the anterior aspect of the manubrium on image 48/2. Again noted are sclerotic lesions involving the sternum. New lytic lesion along the posterior vertebral bodies at T1 and T3. Old sclerotic lesions T7 and T9. New or enlarging lucent lesion involving T9. Probable new lesion involving the T10 vertebral body. Additional small lucent lesions in the spine. IMPRESSION: 1. Progression of metastatic disease throughout the chest and upper abdomen. This represents a significant progression since 03/11/2024. 2. Patient has developed multiple new lytic bone lesions including a large destructive lesion involving the posterior right second rib. Large lytic lesion involving the T1 vertebral body that could be extending into the central canal. 3. Extensive lymphadenopathy in the chest, right axilla and upper abdomen. Evidence for multiple liver lesions. 4. New left hydronephrosis that is incompletely imaged. 5. Bilateral pleural effusions, left side larger than right. Complete collapse or consolidation of the left lower lobe. Volume loss in the right lung. 6. Patchy densities in the right lung are nonspecific and some of this could be metastatic. These results will be called to the ordering clinician or representative by the Radiologist Assistant, and communication documented in the PACS or  [employer]. Electronically Signed   By: Juliene Balder M.D.   On: 05/30/2024 15:17   DG CHEST PORT 1 VIEW Result Date: 05/29/2024  CLINICAL DATA:  Shortness of breath EXAM: PORTABLE CHEST 1 VIEW COMPARISON:  05/28/2024 FINDINGS: Right Port-A-Cath remains in place, unchanged. Platelike density in the right upper lobe, likely scarring, stable. Worsening airspace disease in the left perihilar and lower lobe regions. Possible small left effusion. No effusion on the right. Heart mediastinal contours within normal limits. No acute bony abnormality. IMPRESSION: Worsening left perihilar and lower lobe airspace disease concerning for pneumonia. Suspect small left effusion. Electronically Signed   By: Franky Crease M.D.   On: 05/29/2024 19:10   DG Chest Port 1 View Result Date: 05/28/2024 CLINICAL DATA:  Shortness of breath. Hypoxia. Positive COVID-19 test on 05/24/2024. EXAM: PORTABLE CHEST 1 VIEW COMPARISON:  05/24/2024 FINDINGS: Borderline enlarged cardiac silhouette. Increased patchy and consolidative density in the medial left lung base. Stable right mid lung linear scarring. No airspace consolidation or pleural fluid. Right jugular porta catheter tip in the superior vena cava. Lower thoracic spine degenerative changes and mild bilateral glenohumeral degenerative changes. IMPRESSION: Increased patchy and consolidative density in the medial left lung base, compatible with pneumonia or atelectasis. Electronically Signed   By: Elspeth Bathe M.D.   On: 05/28/2024 10:22   DG Chest Port 1 View Result Date: 05/24/2024 EXAM: 1 VIEW(S) XRAY OF THE CHEST 05/24/2024 10:39:00 AM COMPARISON: None available. 09/13/2023. CLINICAL HISTORY: Shortness of breath, progressively worse. History of COPD and lung cancer. Hypotension. FINDINGS: LINES, TUBES AND DEVICES: Stable right IJ port catheter to the distal SVC. LUNGS AND PLEURA: Persistent poorly marginated coarse airspace opacities about the left hilum. Stable linear scarring  or atelectasis in the right mid lung. No pulmonary edema. No pleural effusion. No pneumothorax. HEART AND MEDIASTINUM: No acute abnormality of the cardiac and mediastinal silhouettes. BONES AND SOFT TISSUES: Right shoulder degenerative joint disease. No acute osseous abnormality. IMPRESSION: 1. Persistent coarse airspace opacities around the left hilum. 2. Stable linear scarring or atelectasis in the right mid lung. 3. Right shoulder degenerative joint disease. 4. Right IJ port catheter tip in the distal SVC. Electronically signed by: Katheleen Faes MD 05/24/2024 10:53 AM EDT RP Workstation: [id number]   DG CHEST PORT 1 VIEW Result Date: 05/13/2024 CLINICAL DATA:  Respiratory failure. EXAM: PORTABLE CHEST 1 VIEW COMPARISON:  Chest CT dated 05/09/2024. FINDINGS: Right-sided Port-A-Cath in similar position. Interval progression of left perihilar densities may represent developing infiltrate. Clinical correlation and follow-up to resolution recommended. Right mid lung field linear atelectasis/scarring. No pleural effusion or pneumothorax. Stable cardiac silhouette no acute osseous pathology. IMPRESSION: Interval progression of left perihilar densities may represent developing infiltrate. Electronically Signed   By: Vanetta Chou M.D.   On: 05/13/2024 14:16   ECHO TEE Result Date: 05/10/2024    TRANSESOPHOGEAL ECHO REPORT   Patient Name:   NAETHAN BRACEWELL Date of Exam: 05/10/2024 Medical Rec #:  [id number]     Height:       73.0 in Accession #:    [id number]    Weight:       262.6 lb Date of Birth:  [birth year]     BSA:          2.416 m Patient Age:    68 years      BP:           85/52 mmHg Patient Gender: M             HR:           139 bpm. Exam Location:  Inpatient Procedure: Transesophageal Echo, 3D Echo, Color Doppler and  Cardiac Doppler            (Both Spectral and Color Flow Doppler were utilized during            procedure). Indications:     I48.91* Unspecified atrial fibrillation  History:         Patient  has prior history of Echocardiogram examinations, most                  recent 05/01/2024. COPD, Arrythmias:Atrial Fibrillation; Risk                  Factors:Hypertension and Lung Cancer.  Sonographer:     Damien Senior RDCS Referring Phys:  [id number] LONNI LITTIE NANAS Diagnosing Phys: Ezra Kanner PROCEDURE: After discussion of the risks and benefits of a TEE, an informed consent was obtained from the patient. The transesophogeal probe was passed without difficulty through the esophogus of the patient. Imaged were obtained with the patient in a left lateral decubitus position. Sedation performed by different physician. The patient was monitored while under deep sedation. Anesthestetic sedation was provided intravenously by Anesthesiology: 203mg  of Propofol , 50mg  of Lidocaine . The patient developed no complications during the procedure. An unsuccessful direct current cardioversion was performed at 360 joules with 3 attempts.  IMPRESSIONS  1. Left ventricular ejection fraction, by estimation, is 55 to 60%. The left ventricle has normal function. The left ventricle has no regional wall motion abnormalities.  2. Right ventricular systolic function is normal. The right ventricular size is normal. Tricuspid regurgitation signal is inadequate for assessing PA pressure.  3. Left atrial size was mildly dilated. No left atrial/left atrial appendage thrombus was detected.  4. The mitral valve is normal in structure. Trivial mitral valve regurgitation. No evidence of mitral stenosis.  5. The aortic valve is tricuspid. Aortic valve regurgitation is trivial. No aortic stenosis is present.  6. No PFO or ASD by color doppler. FINDINGS  Left Ventricle: Left ventricular ejection fraction, by estimation, is 55 to 60%. The left ventricle has normal function. The left ventricle has no regional wall motion abnormalities. The left ventricular internal cavity size was normal in size. There is  no left ventricular hypertrophy. Right  Ventricle: The right ventricular size is normal. No increase in right ventricular wall thickness. Right ventricular systolic function is normal. Tricuspid regurgitation signal is inadequate for assessing PA pressure. Left Atrium: Left atrial size was mildly dilated. No left atrial/left atrial appendage thrombus was detected. Right Atrium: Right atrial size was normal in size. Pericardium: There is no evidence of pericardial effusion. Mitral Valve: The mitral valve is normal in structure. Trivial mitral valve regurgitation. No evidence of mitral valve stenosis. Tricuspid Valve: The tricuspid valve is normal in structure. Tricuspid valve regurgitation is trivial. Aortic Valve: The aortic valve is tricuspid. Aortic valve regurgitation is trivial. No aortic stenosis is present. Pulmonic Valve: The pulmonic valve was grossly normal. Pulmonic valve regurgitation is not visualized. Aorta: The aortic root and ascending aorta are structurally normal, with no evidence of dilitation. IAS/Shunts: No PFO or ASD by color doppler. Additional Comments: Spectral Doppler performed. [employer] Electronically signed by Ezra Kanner Signature Date/Time: 05/10/2024/6:13:20 PM    Final    EP STUDY Result Date: 05/10/2024 See surgical note for result.  DG Chest Port 1 View Result Date: 05/09/2024 EXAM: 1 VIEW(S) XRAY OF THE CHEST 05/09/2024 05:02:00 AM COMPARISON: 05/01/2024 CLINICAL HISTORY: Tachycardia, chest pain radiates down LT arm region, profuse sweating, severe pain from 4 up to 9, slight  dizziness FINDINGS: LINES, TUBES AND DEVICES: Right Port-A-Cath in place with tip overlying midportion of superior vena cava, unchanged. LUNGS AND PLEURA: Low lung volume. Redemonstration of linear atelectasis/scarring overlying right mid lung zone. Additional linear atelectasis/scarring in left lung. No pulmonary edema. No pleural effusion. No pneumothorax. HEART AND MEDIASTINUM: No acute abnormality of the cardiac and mediastinal  silhouettes. BONES AND SOFT TISSUES: No acute osseous abnormality. IMPRESSION: 1. No acute findings. 2. Low lung volume with linear atelectasis/scarring in the right mid lung zone and left lung. Electronically signed by: Evalene Coho MD 05/09/2024 05:34 AM EDT RP Workstation: [id number]    Microbiology: Recent Results (from the past 240 hours)  Resp panel by RT-PCR (RSV, Flu A&B, Covid) Anterior Nasal Swab     Status: Abnormal   Collection Time: 05/24/24 10:56 AM   Specimen: Anterior Nasal Swab  Result Value Ref Range Status   SARS Coronavirus 2 by RT PCR POSITIVE (A) NEGATIVE Final    Comment: (NOTE) SARS-CoV-2 target nucleic acids are DETECTED.  The SARS-CoV-2 RNA is generally detectable in upper respiratory specimens during the acute phase of infection. Positive results are indicative of the presence of the identified virus, but do not rule out bacterial infection or co-infection with other pathogens not detected by the test. Clinical correlation with patient history and other diagnostic information is necessary to determine patient infection status. The expected result is Negative.  Fact Sheet for Patients: BloggerCourse.com  Fact Sheet for Healthcare Providers: SeriousBroker.it  This test is not yet approved or cleared by the [place]  FDA and  has been authorized for detection and/or diagnosis of SARS-CoV-2 by FDA under an Emergency Use Authorization (EUA).  This EUA will remain in effect (meaning this test can be used) for the duration of  the COVID-19 declaration under Section 564(b)(1) of the A ct, 21 U.S.C. section 360bbb-3(b)(1), unless the authorization is terminated or revoked sooner.     Influenza A by PCR NEGATIVE NEGATIVE Final   Influenza B by PCR NEGATIVE NEGATIVE Final    Comment: (NOTE) The Xpert Xpress SARS-CoV-2/FLU/RSV plus assay is intended as an aid in the diagnosis of influenza from  Nasopharyngeal swab specimens and should not be used as a sole basis for treatment. Nasal washings and aspirates are unacceptable for Xpert Xpress SARS-CoV-2/FLU/RSV testing.  Fact Sheet for Patients: BloggerCourse.com  Fact Sheet for Healthcare Providers: SeriousBroker.it  This test is not yet approved or cleared by the [place]  FDA and has been authorized for detection and/or diagnosis of SARS-CoV-2 by FDA under an Emergency Use Authorization (EUA). This EUA will remain in effect (meaning this test can be used) for the duration of the COVID-19 declaration under Section 564(b)(1) of the Act, 21 U.S.C. section 360bbb-3(b)(1), unless the authorization is terminated or revoked.     Resp Syncytial Virus by PCR NEGATIVE NEGATIVE Final    Comment: (NOTE) Fact Sheet for Patients: BloggerCourse.com  Fact Sheet for Healthcare Providers: SeriousBroker.it  This test is not yet approved or cleared by the [place]  FDA and has been authorized for detection and/or diagnosis of SARS-CoV-2 by FDA under an Emergency Use Authorization (EUA). This EUA will remain in effect (meaning this test can be used) for the duration of the COVID-19 declaration under Section 564(b)(1) of the Act, 21 U.S.C. section 360bbb-3(b)(1), unless the authorization is terminated or revoked.  Performed at [facility], [address], [place], [place] [address]   Resp panel by RT-PCR (RSV, Flu A&B, Covid) Anterior Nasal Swab  Status: Abnormal   Collection Time: 05/28/24  9:20 AM   Specimen: Anterior Nasal Swab  Result Value Ref Range Status   SARS Coronavirus 2 by RT PCR POSITIVE (A) NEGATIVE Final    Comment: (NOTE) SARS-CoV-2 target nucleic acids are DETECTED.  The SARS-CoV-2 RNA is generally detectable in upper respiratory specimens during the acute phase of infection. Positive results are indicative  of the presence of the identified virus, but do not rule out bacterial infection or co-infection with other pathogens not detected by the test. Clinical correlation with patient history and other diagnostic information is necessary to determine patient infection status. The expected result is Negative.  Fact Sheet for Patients: BloggerCourse.com  Fact Sheet for Healthcare Providers: SeriousBroker.it  This test is not yet approved or cleared by the [place]  FDA and  has been authorized for detection and/or diagnosis of SARS-CoV-2 by FDA under an Emergency Use Authorization (EUA).  This EUA will remain in effect (meaning this test can be used) for the duration of  the COVID-19 declaration under Section 564(b)(1) of the A ct, 21 U.S.C. section 360bbb-3(b)(1), unless the authorization is terminated or revoked sooner.     Influenza A by PCR NEGATIVE NEGATIVE Final   Influenza B by PCR NEGATIVE NEGATIVE Final    Comment: (NOTE) The Xpert Xpress SARS-CoV-2/FLU/RSV plus assay is intended as an aid in the diagnosis of influenza from Nasopharyngeal swab specimens and should not be used as a sole basis for treatment. Nasal washings and aspirates are unacceptable for Xpert Xpress SARS-CoV-2/FLU/RSV testing.  Fact Sheet for Patients: BloggerCourse.com  Fact Sheet for Healthcare Providers: SeriousBroker.it  This test is not yet approved or cleared by the [place]  FDA and has been authorized for detection and/or diagnosis of SARS-CoV-2 by FDA under an Emergency Use Authorization (EUA). This EUA will remain in effect (meaning this test can be used) for the duration of the COVID-19 declaration under Section 564(b)(1) of the Act, 21 U.S.C. section 360bbb-3(b)(1), unless the authorization is terminated or revoked.     Resp Syncytial Virus by PCR NEGATIVE NEGATIVE Final    Comment:  (NOTE) Fact Sheet for Patients: BloggerCourse.com  Fact Sheet for Healthcare Providers: SeriousBroker.it  This test is not yet approved or cleared by the [place]  FDA and has been authorized for detection and/or diagnosis of SARS-CoV-2 by FDA under an Emergency Use Authorization (EUA). This EUA will remain in effect (meaning this test can be used) for the duration of the COVID-19 declaration under Section 564(b)(1) of the Act, 21 U.S.C. section 360bbb-3(b)(1), unless the authorization is terminated or revoked.  Performed at [facility], [address] [place], [place] [address]   Culture, blood (routine x 2)     Status: None (Preliminary result)   Collection Time: 05/28/24  9:20 AM   Specimen: BLOOD  Result Value Ref Range Status   Specimen Description BLOOD RIGHT ARM  Final   Special Requests   Final    BOTTLES DRAWN AEROBIC AND ANAEROBIC Blood Culture adequate volume   Culture   Final    NO GROWTH 3 DAYS Performed at [facility], [address] [place], [place] [address]    Report Status PENDING  Incomplete  Culture, blood (routine x 2)     Status: None (Preliminary result)   Collection Time: 05/28/24  9:56 AM   Specimen: BLOOD  Result Value Ref Range Status   Specimen Description BLOOD BLOOD LEFT HAND  Final   Special Requests   Final  BOTTLES DRAWN AEROBIC AND ANAEROBIC Blood Culture adequate volume   Culture   Final    NO GROWTH 3 DAYS Performed at [facility], [address], [place], [place] [address]    Report Status PENDING  Incomplete  MRSA Next Gen by PCR, Nasal     Status: None   Collection Time: 05/28/24 12:32 PM   Specimen: Nasal Mucosa; Nasal Swab  Result Value Ref Range Status   MRSA by PCR Next Gen NOT DETECTED NOT DETECTED Final    Comment: (NOTE) The GeneXpert MRSA Assay (FDA approved for NASAL specimens only), is one component of a comprehensive MRSA colonization surveillance program. It is  not intended to diagnose MRSA infection nor to guide or monitor treatment for MRSA infections. Test performance is not FDA approved in patients less than 49 years old. Performed at [facility], [address], [place], [place] [address]      Labs: Basic Metabolic Panel: Recent Labs  Lab 05/25/24 0416 05/28/24 0920 05/29/24 0446 05/30/24 0419 June 14, 2024 0331  NA 140 140 143 145 142  K 3.7 3.9 3.9 4.0 4.4  CL 104 103 107 107 104  CO2 25 23 26 29 28   GLUCOSE 107* 120* 112* 122* 135*  BUN 37* 41* 37* 39* 50*  CREATININE 1.24 1.26* 1.38* 1.47* 1.98*  CALCIUM 9.6 10.2 9.4 10.0 10.0   Liver Function Tests: Recent Labs  Lab 05/25/24 0416 05/28/24 0956 06/14/2024 0331  AST 25 31 21   ALT 17 23 22   ALKPHOS 119 143* 137*  BILITOT <0.2 0.2 0.2  PROT 6.4* 6.5 6.4*  ALBUMIN  3.1* 3.0* 3.0*   No results for input(s): LIPASE, AMYLASE in the last 168 hours. No results for input(s): AMMONIA in the last 168 hours. CBC: Recent Labs  Lab 05/25/24 0416 05/28/24 0920 05/29/24 0446 05/30/24 0419 06-14-2024 0331  WBC 21.6* 30.7* 29.3* 31.8* 35.9*  NEUTROABS  --  24.0*  --   --  30.2*  HGB 11.6* 13.4 10.7* 10.4* 10.4*  HCT 37.9* 43.0 35.4* 35.8* 36.1*  MCV 92.2 92.5 96.5 98.4 99.7  PLT 197 142* 110* 112* 118*   Cardiac Enzymes: No results for input(s): CKTOTAL, CKMB, CKMBINDEX, TROPONINI in the last 168 hours. D-Dimer No results for input(s): DDIMER in the last 72 hours. BNP: Invalid input(s): POCBNP CBG: Recent Labs  Lab 05/28/24 0913  GLUCAP 118*   Anemia work up No results for input(s): VITAMINB12, FOLATE, FERRITIN, TIBC, IRON, RETICCTPCT in the last 72 hours. Urinalysis    Component Value Date/Time   COLORURINE YELLOW 05/28/2024 1546   APPEARANCEUR CLEAR 05/28/2024 1546   APPEARANCEUR Clear 04/10/2020 1334   LABSPEC 1.028 05/28/2024 1546   PHURINE 5.0 05/28/2024 1546   GLUCOSEU 50 (A) 05/28/2024 1546   HGBUR SMALL (A) 05/28/2024 1546    BILIRUBINUR NEGATIVE 05/28/2024 1546   BILIRUBINUR Negative 04/10/2020 1334   KETONESUR NEGATIVE 05/28/2024 1546   PROTEINUR 30 (A) 05/28/2024 1546   NITRITE NEGATIVE 05/28/2024 1546   LEUKOCYTESUR TRACE (A) 05/28/2024 1546   Sepsis Labs Recent Labs  Lab 05/28/24 0920 05/29/24 0446 05/30/24 0419 14-Jun-2024 0331  WBC 30.7* 29.3* 31.8* 35.9*       SIGNED:  Derryl Duval, MD  [facility] 14-Jun-2024, 2:51 PM

## 2024-06-15 NOTE — Progress Notes (Signed)
 Date and time results received: Jun 19, 2024 0832 (use smartphrase .now to insert current time)  Test: PCO2   Critical Value: 71  Name of Provider Notified: Dr Mcarthur   Orders Received? Or Actions Taken?: Awaiting new orders. Patient is on Bipap currently

## 2024-06-15 NOTE — Consult Note (Signed)
 Created in error

## 2024-06-15 NOTE — Progress Notes (Signed)
 Patient heart rate on telemetry noted to slow down with pauses then asystole. Writer and CR, RN in to assess patient. No heart beat or lung sounds auscultated or felt. Time of Death called at 13-Jun-1202. Patient wife and sister's at bedside. Emotional support provided to family. Dr Sigdel made aware.

## 2024-06-15 NOTE — Plan of Care (Signed)

## 2024-06-15 NOTE — Progress Notes (Signed)
 PROGRESS NOTE    Jose Joseph  FMW:[id number] DOB: 1955-10-06 DOA: 05/28/2024 PCP: Sheryle Carwin, MD   Brief Narrative:  Jose Joseph is a 68 y.o. male with medical history significant of metastatic lung CA s/p XRT on immunotherapy, PAF s/p unsuccessful DCCV 9/26 (though later converted on IV amio), on coumadin , COPD, multiple recent hospitalization for cardiopulmonary issues, recent admission for COVID-19 with question of superimposed pneumonia, discharged 10/11 with augmentin , nebulizer, did not qualify for supplemental oxygen . He returned to the ED  due to respiratory distress. He was found to be hypoxic by EMS, given supplemental oxygen , escalating to NRB, and in the ED required BiPAP. Solumedrol and continuous nebs given. Also given diltiazem  for AFib with RVR with no appreciable improvement in rate.  Initiated on amiodarone  drip with improvement in heart rate.  Patient unfortunately has worsening pulmonary infiltrates on chest x-ray with tachypnea/worsening hypoxia requiring BiPAP 10/15 night.  He is switched back to nasal cannula 10/16.  Assessment & Plan:   Principal Problem:   HCAP (healthcare-associated pneumonia) Active Problems:   Atrial fibrillation with rapid ventricular response (HCC)   Demand ischemia (HCC)   Chronic obstructive pulmonary disease (HCC)   Malignant neoplasm of lung (HCC)   Long term current use of anticoagulant   Lung cancer metastatic to bone (HCC)   Severe sepsis (HCC)   Assessment and Plan:    Sepsis due to HCAP in immunosuppressed host:  Remains on broad-spectrum antibiotics vancomycin , cefepime , doxycycline, procalcitonin is negative, blood culture is negative.  MRSA PCR negative.  Will DC vancomycin .   Acute hypoxic respiratory failure: Covid-19 infection: Positive PCR 10/10, Bilateral pulmonary infiltrates Worsening left-sided infiltrates with pleural effusion 10/15 Progression of metastatic lung cancer  - CT scan from 10/16 shows  widespread progression of metastatic lung cancer with collapse of left lower lobe and bilateral pleural effusion. -Patient did not receive twice daily diuretics and creatinine has bumped up further to 1.8 today. - Oncology as well as palliative care medicine consulted - Spoken to patient's wife over the phone.  She indicated that patient has a legal paperwork saying he does not want intubation or resuscitation.  CODE STATUS switched to DNR/DNI accordingly. - Patient currently is on BiPAP, somewhat confused.  Will obtain an ABG. - Bilateral thoracentesis was ordered but not sure if this will help much.  Static lung cancer diagnosed around March 2025, immunotherapy since May 2025: Progression of cancer and acute respiratory failure as above.  AECOPD:  - Continue solumedrol, scheduled and prn nebs (levalbuterol  in place of albuterol ) - Pulmonary hygiene - F/u with pulmonary after discharge.    PAF with RVR: - Likely exacerbated by sepsis. - History of DC cardioversion in the past, historically difficult to control A-fib.  Currently on amiodarone  drip with heart rate under control.  Also is on scheduled metoprolol . Cardiology following.  Heart failure with preserved ejection fraction: BNP elevated 4200.  Received IV fluid on admission.  Will monitor fluid status closely.  IV diuresis as above with close monitoring of electrolytes and kidney function.   Chronic anemia: Mild drop in hemoglobin noted to 10.5, likely from hydration, need to closely monitor for any bleeding.   Thrombocytopenia: ?due to sepsis  - Recheck a.m.   Advance Care Planning: CODE STATUS changed to DNR/DNI based on conversation with wife.  Consults: Cardiology   Family Communication: None at bedside   Severity of Illness: The appropriate patient status for this patient is INPATIENT. Inpatient status is judged to be  reasonable and necessary in order to provide the required intensity of service to ensure the patient's  safety. The patient's presenting symptoms, physical exam findings, and initial radiographic and laboratory data in the context of their chronic comorbidities is felt to place them at high risk for further clinical deterioration. Furthermore, it is not anticipated that the patient will be medically stable for discharge from the hospital within 2 midnights of admission.    * I certify that at the point of admission it is my clinical judgment that the patient will require inpatient hospital care spanning beyond 2 midnights from the point of admission due to high intensity of service, high risk for further deterioration and high frequency of surveillance required.*    Subjective: Patient seen and examined at the bedside.  Overnight events reviewed.  Patient remained on BiPAP most of the night.  Switch to nasal cannula however had to put back on BiPAP.  He has been somewhat lethargic, trying to pull mask.  Signs are stable however heart rate starting to creep up again  Objective: Vitals:   06-29-24 0303 2024/06/29 0357 06/29/24 0534 06/29/2024 0807  BP: (!) 102/58     Pulse: (!) 32 87    Resp: (!) 31 (!) 28    Temp:    97.8 F (36.6 C)  TempSrc:    Axillary  SpO2: (!) 86% 98% 100%   Weight:      Height:        Intake/Output Summary (Last 24 hours) at 06-29-24 9177 Last data filed at 06-29-24 0522 Gross per 24 hour  Intake 1501.89 ml  Output 1200 ml  Net 301.89 ml   Filed Weights   05/28/24 0919  Weight: 116 kg    Examination:  General: Acutely ill looking, tachypneic, mild lethargic, trying to pull BiPAP. chest: Mild wheezing bilaterally, no crackles CVS: S1, S2, no murmur, regular irregular rhythm, tachycardia Abdomen: Soft, nontender Extremities: No edema    Data Reviewed: I have personally reviewed following labs and imaging studies  CBC: Recent Labs  Lab 05/24/24 1026 05/25/24 0416 05/28/24 0920 05/29/24 0446 05/30/24 0419 Jun 29, 2024 0331  WBC 23.0* 21.6* 30.7*  29.3* 31.8* 35.9*  NEUTROABS 17.3*  --  24.0*  --   --  30.2*  HGB 12.5* 11.6* 13.4 10.7* 10.4* 10.4*  HCT 40.0 37.9* 43.0 35.4* 35.8* 36.1*  MCV 90.5 92.2 92.5 96.5 98.4 99.7  PLT 229 197 142* 110* 112* 118*   Basic Metabolic Panel: Recent Labs  Lab 05/25/24 0416 05/28/24 0920 05/29/24 0446 05/30/24 0419 06/29/24 0331  NA 140 140 143 145 142  K 3.7 3.9 3.9 4.0 4.4  CL 104 103 107 107 104  CO2 25 23 26 29 28   GLUCOSE 107* 120* 112* 122* 135*  BUN 37* 41* 37* 39* 50*  CREATININE 1.24 1.26* 1.38* 1.47* 1.98*  CALCIUM 9.6 10.2 9.4 10.0 10.0   GFR: Estimated Creatinine Clearance: 46.3 mL/min (A) (by C-G formula based on SCr of 1.98 mg/dL (H)). Liver Function Tests: Recent Labs  Lab 05/24/24 1026 05/25/24 0416 05/28/24 0956 June 29, 2024 0331  AST 33 25 31 21   ALT 19 17 23 22   ALKPHOS 135* 119 143* 137*  BILITOT 0.3 <0.2 0.2 0.2  PROT 6.7 6.4* 6.5 6.4*  ALBUMIN  3.1* 3.1* 3.0* 3.0*   No results for input(s): LIPASE, AMYLASE in the last 168 hours. No results for input(s): AMMONIA in the last 168 hours. Coagulation Profile: Recent Labs  Lab 05/25/24 0416 05/28/24 0920 05/29/24 0446 05/30/24  9580 Jun 30, 2024 0331  INR 2.5* 3.0* 4.2* 3.4* 1.6*   Cardiac Enzymes: No results for input(s): CKTOTAL, CKMB, CKMBINDEX, TROPONINI in the last 168 hours. BNP (last 3 results) Recent Labs    05/28/24 0920  PROBNP 4,264.0*   HbA1C: No results for input(s): HGBA1C in the last 72 hours. CBG: Recent Labs  Lab 05/28/24 0913  GLUCAP 118*   Lipid Profile: No results for input(s): CHOL, HDL, LDLCALC, TRIG, CHOLHDL, LDLDIRECT in the last 72 hours. Thyroid Function Tests: No results for input(s): TSH, T4TOTAL, FREET4, T3FREE, THYROIDAB in the last 72 hours. Anemia Panel: No results for input(s): VITAMINB12, FOLATE, FERRITIN, TIBC, IRON, RETICCTPCT in the last 72 hours. Sepsis Labs: Recent Labs  Lab 05/24/24 1026 05/24/24 1037  05/24/24 1222 05/28/24 0920 05/28/24 1053 05/29/24 0807 05/30/24 0419  PROCALCITON 0.27  --   --   --   --   --  0.31  LATICACIDVEN  --    < > 1.7 2.7* 2.7* 1.8  --    < > = values in this interval not displayed.    Recent Results (from the past 240 hours)  Resp panel by RT-PCR (RSV, Flu A&B, Covid) Anterior Nasal Swab     Status: Abnormal   Collection Time: 05/24/24 10:56 AM   Specimen: Anterior Nasal Swab  Result Value Ref Range Status   SARS Coronavirus 2 by RT PCR POSITIVE (A) NEGATIVE Final    Comment: (NOTE) SARS-CoV-2 target nucleic acids are DETECTED.  The SARS-CoV-2 RNA is generally detectable in upper respiratory specimens during the acute phase of infection. Positive results are indicative of the presence of the identified virus, but do not rule out bacterial infection or co-infection with other pathogens not detected by the test. Clinical correlation with patient history and other diagnostic information is necessary to determine patient infection status. The expected result is Negative.  Fact Sheet for Patients: BloggerCourse.com  Fact Sheet for Healthcare Providers: SeriousBroker.it  This test is not yet approved or cleared by the [place]  FDA and  has been authorized for detection and/or diagnosis of SARS-CoV-2 by FDA under an Emergency Use Authorization (EUA).  This EUA will remain in effect (meaning this test can be used) for the duration of  the COVID-19 declaration under Section 564(b)(1) of the A ct, 21 U.S.C. section 360bbb-3(b)(1), unless the authorization is terminated or revoked sooner.     Influenza A by PCR NEGATIVE NEGATIVE Final   Influenza B by PCR NEGATIVE NEGATIVE Final    Comment: (NOTE) The Xpert Xpress SARS-CoV-2/FLU/RSV plus assay is intended as an aid in the diagnosis of influenza from Nasopharyngeal swab specimens and should not be used as a sole basis for treatment. Nasal  washings and aspirates are unacceptable for Xpert Xpress SARS-CoV-2/FLU/RSV testing.  Fact Sheet for Patients: BloggerCourse.com  Fact Sheet for Healthcare Providers: SeriousBroker.it  This test is not yet approved or cleared by the [place]  FDA and has been authorized for detection and/or diagnosis of SARS-CoV-2 by FDA under an Emergency Use Authorization (EUA). This EUA will remain in effect (meaning this test can be used) for the duration of the COVID-19 declaration under Section 564(b)(1) of the Act, 21 U.S.C. section 360bbb-3(b)(1), unless the authorization is terminated or revoked.     Resp Syncytial Virus by PCR NEGATIVE NEGATIVE Final    Comment: (NOTE) Fact Sheet for Patients: BloggerCourse.com  Fact Sheet for Healthcare Providers: SeriousBroker.it  This test is not yet approved or cleared by the [place]  FDA and has  been authorized for detection and/or diagnosis of SARS-CoV-2 by FDA under an Emergency Use Authorization (EUA). This EUA will remain in effect (meaning this test can be used) for the duration of the COVID-19 declaration under Section 564(b)(1) of the Act, 21 U.S.C. section 360bbb-3(b)(1), unless the authorization is terminated or revoked.  Performed at [facility], [address], [place], [place] [address]   Resp panel by RT-PCR (RSV, Flu A&B, Covid) Anterior Nasal Swab     Status: Abnormal   Collection Time: 05/28/24  9:20 AM   Specimen: Anterior Nasal Swab  Result Value Ref Range Status   SARS Coronavirus 2 by RT PCR POSITIVE (A) NEGATIVE Final    Comment: (NOTE) SARS-CoV-2 target nucleic acids are DETECTED.  The SARS-CoV-2 RNA is generally detectable in upper respiratory specimens during the acute phase of infection. Positive results are indicative of the presence of the identified virus, but do not rule out bacterial infection or  co-infection with other pathogens not detected by the test. Clinical correlation with patient history and other diagnostic information is necessary to determine patient infection status. The expected result is Negative.  Fact Sheet for Patients: BloggerCourse.com  Fact Sheet for Healthcare Providers: SeriousBroker.it  This test is not yet approved or cleared by the [place]  FDA and  has been authorized for detection and/or diagnosis of SARS-CoV-2 by FDA under an Emergency Use Authorization (EUA).  This EUA will remain in effect (meaning this test can be used) for the duration of  the COVID-19 declaration under Section 564(b)(1) of the A ct, 21 U.S.C. section 360bbb-3(b)(1), unless the authorization is terminated or revoked sooner.     Influenza A by PCR NEGATIVE NEGATIVE Final   Influenza B by PCR NEGATIVE NEGATIVE Final    Comment: (NOTE) The Xpert Xpress SARS-CoV-2/FLU/RSV plus assay is intended as an aid in the diagnosis of influenza from Nasopharyngeal swab specimens and should not be used as a sole basis for treatment. Nasal washings and aspirates are unacceptable for Xpert Xpress SARS-CoV-2/FLU/RSV testing.  Fact Sheet for Patients: BloggerCourse.com  Fact Sheet for Healthcare Providers: SeriousBroker.it  This test is not yet approved or cleared by the [place]  FDA and has been authorized for detection and/or diagnosis of SARS-CoV-2 by FDA under an Emergency Use Authorization (EUA). This EUA will remain in effect (meaning this test can be used) for the duration of the COVID-19 declaration under Section 564(b)(1) of the Act, 21 U.S.C. section 360bbb-3(b)(1), unless the authorization is terminated or revoked.     Resp Syncytial Virus by PCR NEGATIVE NEGATIVE Final    Comment: (NOTE) Fact Sheet for Patients: BloggerCourse.com  Fact  Sheet for Healthcare Providers: SeriousBroker.it  This test is not yet approved or cleared by the [place]  FDA and has been authorized for detection and/or diagnosis of SARS-CoV-2 by FDA under an Emergency Use Authorization (EUA). This EUA will remain in effect (meaning this test can be used) for the duration of the COVID-19 declaration under Section 564(b)(1) of the Act, 21 U.S.C. section 360bbb-3(b)(1), unless the authorization is terminated or revoked.  Performed at [facility], [address] [place], [place] [address]   Culture, blood (routine x 2)     Status: None (Preliminary result)   Collection Time: 05/28/24  9:20 AM   Specimen: BLOOD  Result Value Ref Range Status   Specimen Description BLOOD RIGHT ARM  Final   Special Requests   Final    BOTTLES DRAWN AEROBIC AND ANAEROBIC Blood Culture adequate volume  Culture   Final    NO GROWTH 3 DAYS Performed at [facility], [address] [place], [place] [address]    Report Status PENDING  Incomplete  Culture, blood (routine x 2)     Status: None (Preliminary result)   Collection Time: 05/28/24  9:56 AM   Specimen: BLOOD  Result Value Ref Range Status   Specimen Description BLOOD BLOOD LEFT HAND  Final   Special Requests   Final    BOTTLES DRAWN AEROBIC AND ANAEROBIC Blood Culture adequate volume   Culture   Final    NO GROWTH 3 DAYS Performed at [facility], [address], [place], [place] [address]    Report Status PENDING  Incomplete  MRSA Next Gen by PCR, Nasal     Status: None   Collection Time: 05/28/24 12:32 PM   Specimen: Nasal Mucosa; Nasal Swab  Result Value Ref Range Status   MRSA by PCR Next Gen NOT DETECTED NOT DETECTED Final    Comment: (NOTE) The GeneXpert MRSA Assay (FDA approved for NASAL specimens only), is one component of a comprehensive MRSA colonization surveillance program. It is not intended to diagnose MRSA infection nor to guide or monitor treatment for MRSA  infections. Test performance is not FDA approved in patients less than 84 years old. Performed at [facility], [address], [place], [place] [address]          Radiology Studies: DG CHEST PORT 1 VIEW Result Date: 2024/06/09 EXAM: 1 VIEW XRAY OF THE CHEST 06-09-2024 08:01:00 AM COMPARISON: Comparison yesterday. CLINICAL HISTORY: [id number] Acute hypoxic respiratory failure (HCC) [id number]. Respiratory distress, hx pna. FINDINGS: LINES, TUBES AND DEVICES: Right internal jugular port-a-cath is unchanged. LUNGS AND PLEURA: Stable left pleural effusion with associated atelectasis. Stable right mid lung subsegmental atelectasis. No pulmonary edema. No pneumothorax. HEART AND MEDIASTINUM: Stable cardiomediastinal silhouette. BONES AND SOFT TISSUES: Destructive lesion in right scapula is again noted consistent with metastatic lesion. IMPRESSION: 1. Stable left pleural effusion with associated atelectasis, and stable right mid lung subsegmental atelectasis. 2. Destructive right scapular lesion consistent with metastatic disease. Electronically signed by: Lynwood Seip MD 06-09-24 08:09 AM EDT RP Workstation: [id number]   DG CHEST PORT 1 VIEW Result Date: 05/30/2024 CLINICAL DATA:  Hypoxia. EXAM: PORTABLE CHEST 1 VIEW COMPARISON:  Chest CT 05/30/2024.  Chest radiograph 05/29/2024 FINDINGS: Low lung volumes with opacities at the left lung base compatible with volume loss and pleural fluid. Bandlike densities in the right lung are suggestive for atelectasis. Heart size is grossly stable. Lytic lesion with bone destruction along the inferior aspect of the right scapula. There is also lucency in the expected region of the right posterior second rib compatible with a lytic bone lesion. Negative for pneumothorax. IMPRESSION: 1. Low lung volumes with left basilar chest densities. Findings are suggestive for left pleural effusion and volume loss. 2. Lucent bone lesions involving the right scapula and right second rib.  Findings are compatible with metastatic disease. Electronically Signed   By: Juliene Balder M.D.   On: 05/30/2024 15:21   CT CHEST WO CONTRAST Result Date: 05/30/2024 CLINICAL DATA:  Pneumonia, complications suspected. EXAM: CT CHEST WITHOUT CONTRAST TECHNIQUE: Multidetector CT imaging of the chest was performed following the standard protocol without IV contrast. RADIATION DOSE REDUCTION: This exam was performed according to the departmental dose-optimization program which includes automated exposure control, adjustment of the mA and/or kV according to patient size and/or use of iterative reconstruction technique. COMPARISON:  03/11/2024 FINDINGS: Cardiovascular: Heart size is  stable. Small amount of pericardial fluid. Right jugular Port-A-Cath with the tip near the superior cavoatrial junction. Mediastinum/Nodes: Compared to the exam on 03/11/2024, patient has developed multiple small lymph nodes in the anterior mediastinum and in the right cardiophrenic region. Conglomeration of nodal tissue in the right cardiophrenic area measures 1.7 cm on image 102/2. New significant lymphadenopathy in the right axillary and right sub pectoralis region. Enlarged supraclavicular lymphadenopathy bilaterally. Esophagus is poorly characterized. Lungs/Pleura: Bilateral pleural effusions, left side greater than right. Left pleural effusion is moderate in size. Right pleural effusion is small. Complete collapse or consolidation in the left lower lobe. Medial consolidation / volume loss in the left upper lobe. Limited evaluation of the lungs due to motion artifact. Volume loss in the right lower lobe. Again noted is a small nodule at the right lung base on image 108/3 but poorly characterized due to motion artifact. New patchy peripheral opacities in the right lung apex. There may be new small subpleural nodular densities in the right lung on image 57/3. Upper Abdomen: There is significant artifact in the abdomen but there is concern  for multiple new liver lesions. There may be a small amount of perihepatic ascites. New fullness in the left renal pelvis and there is probably at least mild left hydronephrosis which is incompletely evaluated. Lymph node enlargement in the gastrohepatic ligament. Musculoskeletal: New soft tissue mass involving the posterior right second rib with destruction of the posterior rib. Soft tissue structure measures 6.0 x 4.3 cm image 28, sequence 2. Lytic lesions involving the right scapula and right coracoid process. Again noted are sclerotic lesions involving the manubrium. However, there is a new lytic lesion along the anterior aspect of the manubrium on image 48/2. Again noted are sclerotic lesions involving the sternum. New lytic lesion along the posterior vertebral bodies at T1 and T3. Old sclerotic lesions T7 and T9. New or enlarging lucent lesion involving T9. Probable new lesion involving the T10 vertebral body. Additional small lucent lesions in the spine. IMPRESSION: 1. Progression of metastatic disease throughout the chest and upper abdomen. This represents a significant progression since 03/11/2024. 2. Patient has developed multiple new lytic bone lesions including a large destructive lesion involving the posterior right second rib. Large lytic lesion involving the T1 vertebral body that could be extending into the central canal. 3. Extensive lymphadenopathy in the chest, right axilla and upper abdomen. Evidence for multiple liver lesions. 4. New left hydronephrosis that is incompletely imaged. 5. Bilateral pleural effusions, left side larger than right. Complete collapse or consolidation of the left lower lobe. Volume loss in the right lung. 6. Patchy densities in the right lung are nonspecific and some of this could be metastatic. These results will be called to the ordering clinician or representative by the Radiologist Assistant, and communication documented in the PACS or [employer].  Electronically Signed   By: Juliene Balder M.D.   On: 05/30/2024 15:17   DG CHEST PORT 1 VIEW Result Date: 05/29/2024 CLINICAL DATA:  Shortness of breath EXAM: PORTABLE CHEST 1 VIEW COMPARISON:  05/28/2024 FINDINGS: Right Port-A-Cath remains in place, unchanged. Platelike density in the right upper lobe, likely scarring, stable. Worsening airspace disease in the left perihilar and lower lobe regions. Possible small left effusion. No effusion on the right. Heart mediastinal contours within normal limits. No acute bony abnormality. IMPRESSION: Worsening left perihilar and lower lobe airspace disease concerning for pneumonia. Suspect small left effusion. Electronically Signed   By: Franky Crease M.D.  On: 05/29/2024 19:10        Scheduled Meds:  Chlorhexidine  Gluconate Cloth  6 each Topical Q0600   feeding supplement  237 mL Oral BID BM   ipratropium  0.5 mg Nebulization Q6H   levalbuterol   0.63 mg Nebulization Q6H   melatonin  3 mg Oral QHS   methylPREDNISolone  (SOLU-MEDROL ) injection  40 mg Intravenous Q12H   metoprolol  tartrate  25 mg Oral QID   sodium chloride  flush  3 mL Intravenous Q12H   tamsulosin   0.4 mg Oral Daily   Warfarin - Pharmacist Dosing Inpatient   Does not apply q1600   Continuous Infusions:  amiodarone  30 mg/hr (2024/06/15 0522)   ceFEPime  (MAXIPIME ) IV 2 g (2023/10/16 0608)   doxycycline (VIBRAMYCIN) IV Stopped (2024/06/15 0229)   vancomycin  Stopped (05/30/24 1125)          Derryl Duval, MD [facility] 2024/06/15, 8:22 AM

## 2024-06-15 DEATH — deceased

## 2024-06-17 ENCOUNTER — Ambulatory Visit: Admitting: Oncology

## 2024-06-24 ENCOUNTER — Inpatient Hospital Stay: Admitting: Oncology

## 2024-06-28 ENCOUNTER — Ambulatory Visit: Admitting: Nurse Practitioner

## 2024-07-22 ENCOUNTER — Ambulatory Visit: Admitting: Internal Medicine
